# Patient Record
Sex: Female | Born: 1980
Health system: Southern US, Community
[De-identification: ages and names within clinical notes are randomized; demographics above are authoritative.]

## PROBLEM LIST (undated history)

## (undated) DIAGNOSIS — D649 Anemia, unspecified: Secondary | ICD-10-CM

## (undated) DIAGNOSIS — K219 Gastro-esophageal reflux disease without esophagitis: Secondary | ICD-10-CM

## (undated) DIAGNOSIS — I639 Cerebral infarction, unspecified: Secondary | ICD-10-CM

## (undated) DIAGNOSIS — I517 Cardiomegaly: Secondary | ICD-10-CM

## (undated) DIAGNOSIS — M1990 Unspecified osteoarthritis, unspecified site: Secondary | ICD-10-CM

## (undated) DIAGNOSIS — J45909 Unspecified asthma, uncomplicated: Secondary | ICD-10-CM

## (undated) DIAGNOSIS — M7070 Other bursitis of hip, unspecified hip: Secondary | ICD-10-CM

## (undated) DIAGNOSIS — M25569 Pain in unspecified knee: Secondary | ICD-10-CM

## (undated) DIAGNOSIS — M94261 Chondromalacia, right knee: Secondary | ICD-10-CM

## (undated) DIAGNOSIS — J449 Chronic obstructive pulmonary disease, unspecified: Secondary | ICD-10-CM

## (undated) DIAGNOSIS — M94259 Chondromalacia, unspecified hip: Secondary | ICD-10-CM

## (undated) DIAGNOSIS — H9319 Tinnitus, unspecified ear: Secondary | ICD-10-CM

## (undated) DIAGNOSIS — Z9289 Personal history of other medical treatment: Secondary | ICD-10-CM

## (undated) HISTORY — DX: Unspecified osteoarthritis, unspecified site: M19.90

## (undated) HISTORY — PX: NO PAST SURGERIES: SHX2092

## (undated) HISTORY — DX: Cerebral infarction, unspecified: I63.9

---

## 2002-06-03 ENCOUNTER — Ambulatory Visit (HOSPITAL_COMMUNITY): Admission: RE | Admit: 2002-06-03 | Discharge: 2002-06-03 | Payer: Self-pay | Admitting: *Deleted

## 2002-06-03 ENCOUNTER — Encounter: Payer: Self-pay | Admitting: *Deleted

## 2006-03-31 ENCOUNTER — Emergency Department (HOSPITAL_COMMUNITY): Admission: EM | Admit: 2006-03-31 | Discharge: 2006-03-31 | Payer: Self-pay | Admitting: Emergency Medicine

## 2006-11-16 ENCOUNTER — Emergency Department (HOSPITAL_COMMUNITY): Admission: EM | Admit: 2006-11-16 | Discharge: 2006-11-16 | Payer: Self-pay | Admitting: Emergency Medicine

## 2007-03-07 ENCOUNTER — Emergency Department (HOSPITAL_COMMUNITY): Admission: EM | Admit: 2007-03-07 | Discharge: 2007-03-07 | Payer: Self-pay | Admitting: Emergency Medicine

## 2007-07-30 ENCOUNTER — Emergency Department (HOSPITAL_COMMUNITY): Admission: EM | Admit: 2007-07-30 | Discharge: 2007-07-30 | Payer: Self-pay | Admitting: Emergency Medicine

## 2008-08-03 ENCOUNTER — Other Ambulatory Visit: Admission: RE | Admit: 2008-08-03 | Discharge: 2008-08-03 | Payer: Self-pay | Admitting: Obstetrics and Gynecology

## 2008-12-15 ENCOUNTER — Encounter (HOSPITAL_COMMUNITY): Admission: RE | Admit: 2008-12-15 | Discharge: 2009-01-14 | Payer: Self-pay | Admitting: Obstetrics & Gynecology

## 2008-12-15 ENCOUNTER — Ambulatory Visit (HOSPITAL_COMMUNITY): Payer: Self-pay | Admitting: Obstetrics & Gynecology

## 2008-12-22 ENCOUNTER — Ambulatory Visit (HOSPITAL_COMMUNITY): Payer: Self-pay | Admitting: Obstetrics and Gynecology

## 2008-12-25 ENCOUNTER — Emergency Department (HOSPITAL_COMMUNITY): Admission: EM | Admit: 2008-12-25 | Discharge: 2008-12-25 | Payer: Self-pay | Admitting: Emergency Medicine

## 2009-07-23 ENCOUNTER — Emergency Department (HOSPITAL_COMMUNITY): Admission: EM | Admit: 2009-07-23 | Discharge: 2009-07-23 | Payer: Self-pay | Admitting: Emergency Medicine

## 2009-09-03 ENCOUNTER — Emergency Department (HOSPITAL_COMMUNITY): Admission: EM | Admit: 2009-09-03 | Discharge: 2009-09-03 | Payer: Self-pay | Admitting: Emergency Medicine

## 2011-01-11 LAB — CBC
HCT: 25.7 % — ABNORMAL LOW (ref 36.0–46.0)
Hemoglobin: 8 g/dL — ABNORMAL LOW (ref 12.0–15.0)
MCHC: 31.2 g/dL (ref 30.0–36.0)
MCV: 71.9 fL — ABNORMAL LOW (ref 78.0–100.0)
Platelets: 352 10*3/uL (ref 150–400)
RBC: 3.58 MIL/uL — ABNORMAL LOW (ref 3.87–5.11)
RDW: 17.7 % — ABNORMAL HIGH (ref 11.5–15.5)
WBC: 4.9 10*3/uL (ref 4.0–10.5)

## 2011-01-11 LAB — COMPREHENSIVE METABOLIC PANEL
ALT: 16 U/L (ref 0–35)
AST: 18 U/L (ref 0–37)
Albumin: 3.7 g/dL (ref 3.5–5.2)
Alkaline Phosphatase: 42 U/L (ref 39–117)
BUN: 7 mg/dL (ref 6–23)
CO2: 27 mEq/L (ref 19–32)
Calcium: 8.8 mg/dL (ref 8.4–10.5)
Chloride: 105 mEq/L (ref 96–112)
Creatinine, Ser: 0.66 mg/dL (ref 0.4–1.2)
GFR calc Af Amer: >60 mL/min (ref >60)
GFR calc non Af Amer: >60 mL/min (ref >60)
Glucose, Bld: 95 mg/dL (ref 70–99)
Potassium: 3.3 mEq/L — ABNORMAL LOW (ref 3.5–5.1)
Sodium: 137 mEq/L (ref 135–145)
Total Bilirubin: 0.6 mg/dL (ref 0.3–1.2)
Total Protein: 7 g/dL (ref 6.0–8.3)

## 2011-01-11 LAB — DIFFERENTIAL
Basophils Absolute: 0 10*3/uL (ref 0.0–0.1)
Basophils Relative: 0 % (ref 0–1)
Eosinophils Absolute: 0.1 10*3/uL (ref 0.0–0.7)
Eosinophils Relative: 2 % (ref 0–5)
Lymphocytes Relative: 40 % (ref 12–46)
Lymphs Abs: 2 10*3/uL (ref 0.7–4.0)
Monocytes Absolute: 0.4 10*3/uL (ref 0.1–1.0)
Monocytes Relative: 9 % (ref 3–12)
Neutro Abs: 2.4 10*3/uL (ref 1.7–7.7)
Neutrophils Relative %: 49 % (ref 43–77)

## 2011-01-11 LAB — HCG, SERUM, QUALITATIVE: Preg, Serum: NEGATIVE

## 2011-01-11 LAB — WET PREP, GENITAL
Trich, Wet Prep: NONE SEEN
WBC, Wet Prep HPF POC: NONE SEEN
Yeast Wet Prep HPF POC: NONE SEEN

## 2011-01-11 LAB — GC/CHLAMYDIA PROBE AMP, GENITAL
Chlamydia, DNA Probe: NEGATIVE
GC Probe Amp, Genital: NEGATIVE

## 2011-01-11 LAB — RPR: RPR Ser Ql: NONREACTIVE

## 2011-01-12 LAB — CBC
HCT: 25.7 % — ABNORMAL LOW (ref 36.0–46.0)
Hemoglobin: 8.1 g/dL — ABNORMAL LOW (ref 12.0–15.0)
MCHC: 31.5 g/dL (ref 30.0–36.0)
MCV: 75.8 fL — ABNORMAL LOW (ref 78.0–100.0)
Platelets: 322 10*3/uL (ref 150–400)
RBC: 3.39 MIL/uL — ABNORMAL LOW (ref 3.87–5.11)
RDW: 16.5 % — ABNORMAL HIGH (ref 11.5–15.5)
WBC: 5.2 10*3/uL (ref 4.0–10.5)

## 2011-01-12 LAB — DIFFERENTIAL
Basophils Absolute: 0 10*3/uL (ref 0.0–0.1)
Basophils Relative: 1 % (ref 0–1)
Eosinophils Absolute: 0.1 10*3/uL (ref 0.0–0.7)
Eosinophils Relative: 3 % (ref 0–5)
Lymphocytes Relative: 35 % (ref 12–46)
Lymphs Abs: 1.8 10*3/uL (ref 0.7–4.0)
Monocytes Absolute: 0.4 10*3/uL (ref 0.1–1.0)
Monocytes Relative: 9 % (ref 3–12)
Neutro Abs: 2.8 10*3/uL (ref 1.7–7.7)
Neutrophils Relative %: 54 % (ref 43–77)

## 2011-01-12 LAB — BASIC METABOLIC PANEL
BUN: 6 mg/dL (ref 6–23)
CO2: 26 mEq/L (ref 19–32)
Calcium: 8.6 mg/dL (ref 8.4–10.5)
Chloride: 106 mEq/L (ref 96–112)
Creatinine, Ser: 0.68 mg/dL (ref 0.4–1.2)
GFR calc Af Amer: >60 mL/min (ref >60)
GFR calc non Af Amer: >60 mL/min (ref >60)
Glucose, Bld: 90 mg/dL (ref 70–99)
Potassium: 2.8 mEq/L — ABNORMAL LOW (ref 3.5–5.1)
Sodium: 138 mEq/L (ref 135–145)

## 2011-01-19 LAB — URINALYSIS, ROUTINE W REFLEX MICROSCOPIC
Bilirubin Urine: NEGATIVE
Glucose, UA: NEGATIVE mg/dL
Hgb urine dipstick: NEGATIVE
Ketones, ur: NEGATIVE mg/dL
Nitrite: NEGATIVE
Protein, ur: NEGATIVE mg/dL
Specific Gravity, Urine: 1.015 (ref 1.005–1.030)
Urobilinogen, UA: 0.2 mg/dL (ref 0.0–1.0)
pH: 7.5 (ref 5.0–8.0)

## 2011-01-19 LAB — URINE MICROSCOPIC-ADD ON

## 2011-01-19 LAB — URINE CULTURE: Colony Count: 60000

## 2011-07-19 LAB — DIFFERENTIAL
Basophils Absolute: 0
Basophils Relative: 0
Eosinophils Absolute: 0.2
Eosinophils Relative: 3
Lymphocytes Relative: 24
Lymphs Abs: 1.6
Monocytes Absolute: 0.5
Monocytes Relative: 8
Neutro Abs: 4.3
Neutrophils Relative %: 65

## 2011-07-19 LAB — CBC
HCT: 30.4 — ABNORMAL LOW
Hemoglobin: 9.9 — ABNORMAL LOW
MCHC: 32.8
MCV: 89.2
Platelets: 318
RBC: 3.4 — ABNORMAL LOW
RDW: 16.5 — ABNORMAL HIGH
WBC: 6.6

## 2011-07-19 LAB — BASIC METABOLIC PANEL
BUN: 6
CO2: 30
Calcium: 9.8
Chloride: 102
Creatinine, Ser: 0.61
GFR calc Af Amer: >60
GFR calc non Af Amer: >60
Glucose, Bld: 77
Potassium: 4
Sodium: 141

## 2012-12-30 ENCOUNTER — Emergency Department: Payer: Self-pay | Admitting: Emergency Medicine

## 2013-01-29 ENCOUNTER — Emergency Department: Payer: Self-pay | Admitting: Emergency Medicine

## 2013-01-29 LAB — URINALYSIS, COMPLETE
Bacteria: NONE SEEN
Bilirubin,UR: NEGATIVE
Glucose,UR: NEGATIVE mg/dL (ref 0–75)
Ketone: NEGATIVE
Nitrite: NEGATIVE
Ph: 5 (ref 4.5–8.0)
Protein: 30
RBC,UR: 875 /HPF (ref 0–5)
Specific Gravity: 1.031 (ref 1.003–1.030)
Squamous Epithelial: 6
WBC UR: 19 /HPF (ref 0–5)

## 2013-01-30 ENCOUNTER — Emergency Department: Payer: Self-pay | Admitting: Unknown Physician Specialty

## 2013-01-30 LAB — COMPREHENSIVE METABOLIC PANEL
Albumin: 3.6 g/dL (ref 3.4–5.0)
Alkaline Phosphatase: 49 U/L — ABNORMAL LOW (ref 50–136)
Anion Gap: 4 — ABNORMAL LOW (ref 7–16)
BUN: 8 mg/dL (ref 7–18)
Bilirubin,Total: 0.4 mg/dL (ref 0.2–1.0)
Calcium, Total: 8.2 mg/dL — ABNORMAL LOW (ref 8.5–10.1)
Chloride: 107 mmol/L (ref 98–107)
Co2: 25 mmol/L (ref 21–32)
Creatinine: 0.66 mg/dL (ref 0.60–1.30)
EGFR (African American): >60
EGFR (Non-African Amer.): >60
Glucose: 99 mg/dL (ref 65–99)
Osmolality: 270 (ref 275–301)
Potassium: 3.6 mmol/L (ref 3.5–5.1)
SGOT(AST): 22 U/L (ref 15–37)
SGPT (ALT): 18 U/L (ref 12–78)
Sodium: 136 mmol/L (ref 136–145)
Total Protein: 7.7 g/dL (ref 6.4–8.2)

## 2013-01-30 LAB — URINALYSIS, COMPLETE
Bilirubin,UR: NEGATIVE
Glucose,UR: NEGATIVE mg/dL (ref 0–75)
Ketone: NEGATIVE
Nitrite: NEGATIVE
Ph: 5 (ref 4.5–8.0)
Protein: 30
RBC,UR: 949 /HPF (ref 0–5)
Specific Gravity: 1.025 (ref 1.003–1.030)
Squamous Epithelial: 3
WBC UR: 6 /HPF (ref 0–5)

## 2013-01-30 LAB — CBC
HCT: 31.5 % — ABNORMAL LOW (ref 35.0–47.0)
HGB: 9.8 g/dL — ABNORMAL LOW (ref 12.0–16.0)
MCH: 24.5 pg — ABNORMAL LOW (ref 26.0–34.0)
MCHC: 31 g/dL — ABNORMAL LOW (ref 32.0–36.0)
MCV: 79 fL — ABNORMAL LOW (ref 80–100)
Platelet: 278 10*3/uL (ref 150–440)
RBC: 3.98 10*6/uL (ref 3.80–5.20)
RDW: 19.2 % — ABNORMAL HIGH (ref 11.5–14.5)
WBC: 4.3 10*3/uL (ref 3.6–11.0)

## 2013-01-30 LAB — WET PREP, GENITAL

## 2013-05-30 ENCOUNTER — Emergency Department: Payer: Self-pay | Admitting: Emergency Medicine

## 2013-07-22 ENCOUNTER — Emergency Department: Payer: Self-pay | Admitting: Emergency Medicine

## 2013-07-22 LAB — URINALYSIS, COMPLETE
Bilirubin,UR: NEGATIVE
Glucose,UR: NEGATIVE mg/dL (ref 0–75)
Ketone: NEGATIVE
Nitrite: NEGATIVE
Ph: 6 (ref 4.5–8.0)
Protein: 100
RBC,UR: 113 /HPF (ref 0–5)
Specific Gravity: 1.023 (ref 1.003–1.030)
Squamous Epithelial: 19
WBC UR: 518 /HPF (ref 0–5)

## 2013-08-26 ENCOUNTER — Emergency Department: Payer: Self-pay | Admitting: Emergency Medicine

## 2013-08-26 LAB — HCG, QUANTITATIVE, PREGNANCY: Beta Hcg, Quant.: <1 m[IU]/mL — ABNORMAL LOW

## 2013-08-26 LAB — URINALYSIS, COMPLETE
Bilirubin,UR: NEGATIVE
Blood: NEGATIVE
Glucose,UR: NEGATIVE mg/dL (ref 0–75)
Ketone: NEGATIVE
Leukocyte Esterase: NEGATIVE
Nitrite: NEGATIVE
Ph: 6 (ref 4.5–8.0)
Protein: NEGATIVE
RBC,UR: 2 /HPF (ref 0–5)
Specific Gravity: 1.03 (ref 1.003–1.030)
Squamous Epithelial: 13
WBC UR: 3 /HPF (ref 0–5)

## 2013-08-26 LAB — COMPREHENSIVE METABOLIC PANEL
Albumin: 3.6 g/dL (ref 3.4–5.0)
Alkaline Phosphatase: 59 U/L (ref 50–136)
Anion Gap: 7 (ref 7–16)
BUN: 9 mg/dL (ref 7–18)
Bilirubin,Total: 0.3 mg/dL (ref 0.2–1.0)
Calcium, Total: 8.8 mg/dL (ref 8.5–10.1)
Chloride: 105 mmol/L (ref 98–107)
Co2: 27 mmol/L (ref 21–32)
Creatinine: 0.77 mg/dL (ref 0.60–1.30)
EGFR (African American): >60
EGFR (Non-African Amer.): >60
Glucose: 123 mg/dL — ABNORMAL HIGH (ref 65–99)
Osmolality: 278 (ref 275–301)
Potassium: 3.3 mmol/L — ABNORMAL LOW (ref 3.5–5.1)
SGOT(AST): 20 U/L (ref 15–37)
SGPT (ALT): 18 U/L (ref 12–78)
Sodium: 139 mmol/L (ref 136–145)
Total Protein: 7.4 g/dL (ref 6.4–8.2)

## 2013-08-26 LAB — CBC WITH DIFFERENTIAL/PLATELET
Basophil #: 0 10*3/uL (ref 0.0–0.1)
Basophil %: 0.5 %
Eosinophil #: 0.1 10*3/uL (ref 0.0–0.7)
Eosinophil %: 1.8 %
HCT: 28.5 % — ABNORMAL LOW (ref 35.0–47.0)
HGB: 9 g/dL — ABNORMAL LOW (ref 12.0–16.0)
Lymphocyte #: 1.9 10*3/uL (ref 1.0–3.6)
Lymphocyte %: 30.4 %
MCH: 24.1 pg — ABNORMAL LOW (ref 26.0–34.0)
MCHC: 31.5 g/dL — ABNORMAL LOW (ref 32.0–36.0)
MCV: 76 fL — ABNORMAL LOW (ref 80–100)
Monocyte #: 0.5 x10 3/mm (ref 0.2–0.9)
Monocyte %: 7.6 %
Neutrophil #: 3.8 10*3/uL (ref 1.4–6.5)
Neutrophil %: 59.7 %
Platelet: 324 10*3/uL (ref 150–440)
RBC: 3.74 10*6/uL — ABNORMAL LOW (ref 3.80–5.20)
RDW: 17.7 % — ABNORMAL HIGH (ref 11.5–14.5)
WBC: 6.4 10*3/uL (ref 3.6–11.0)

## 2013-10-20 ENCOUNTER — Emergency Department (HOSPITAL_COMMUNITY)
Admission: EM | Admit: 2013-10-20 | Discharge: 2013-10-20 | Disposition: A | Discharge status: Home / Self Care | Payer: Medicaid Other | Attending: Emergency Medicine | Admitting: Emergency Medicine

## 2013-10-20 ENCOUNTER — Encounter (HOSPITAL_COMMUNITY): Payer: Self-pay | Admitting: Emergency Medicine

## 2013-10-20 DIAGNOSIS — G8929 Other chronic pain: Secondary | ICD-10-CM | POA: Insufficient documentation

## 2013-10-20 DIAGNOSIS — M25561 Pain in right knee: Secondary | ICD-10-CM

## 2013-10-20 DIAGNOSIS — M25569 Pain in unspecified knee: Secondary | ICD-10-CM | POA: Insufficient documentation

## 2013-10-20 HISTORY — DX: Personal history of other medical treatment: Z92.89

## 2013-10-20 HISTORY — DX: Gastro-esophageal reflux disease without esophagitis: K21.9

## 2013-10-20 HISTORY — DX: Unspecified asthma, uncomplicated: J45.909

## 2013-10-20 HISTORY — DX: Pain in unspecified knee: M25.569
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
## 2013-10-20 NOTE — Discharge Instructions (Signed)
Take ibuprofen, tylenol or aleve every 6 hours as needed for pain.  Knee Bracing Knee braces are supports to help stabilize and protect an injured or painful knee. They come in many different styles. They should support and protect the knee without increasing the chance of other injuries to yourself or others. It is important not to have a false sense of security when using a brace. Knee braces that help you to keep using your knee:  Do not restore normal knee stability under high stress forces.  May decrease some aspects of athletic performance. Some of the different types of knee braces are:  Prophylactic knee braces are designed to prevent or reduce the severity of knee injuries during sports that make injury to the knee more likely.  Rehabilitative knee braces are designed to allow protected motion of:  Injured knees.  Knees that have been treated with or without surgery. There is no evidence that the use of a supportive knee brace protects the graft following a successful anterior cruciate ligament (ACL) reconstruction. However, braces are sometimes used to:   Protect injured ligaments.  Control knee movement during the initial healing period. They may be used as part of the treatment program for the various injured ligaments or cartilage of the knee including the:  Anterior cruciate ligament.  Medial collateral ligament.  Medial or lateral cartilage (meniscus).  Posterior cruciate ligament.  Lateral collateral ligament. Rehabilitative knee braces are most commonly used:  During crutch-assisted walking right after injury.  During crutch-assisted walking right after surgery to repair the cartilage and/or cruciate ligament injury.  For a short period of time, 2-8 weeks, after the injury or surgery. The value of a rehabilitative brace as opposed to a cast or splint includes the:  Ability to adjust the brace for swelling.  Ability to remove the brace for examinations,  icing or showering.  Ability to allow for movement in a controlled range of motion. Functional knee braces give support to knees that have already been injured. They are designed to provide stability for the injured knee and provide protection after repair. Functional knee braces may not affect performance much. Lower extremity muscle strengthening, flexibility, and improvement in technique are more important than bracing in treating ligamentous knee injuries. Functional braces are not a substitute for rehabilitation or surgical procedures. Unloader/offloader braces are designed to provide pain relief in arthritic knees. Patients with wear and tear arthritis from growing old or from an old cartilage injury (osteoarthritis) of the knee, and bow legged (varus) or knock knee (valgus) deformities, often develop increased pain in the arthritic side due to increased loading. Unloader/offloader braces are made to reduce uneven loading in such knees. There is reduction in bowing out movement in bow legged knees when the correct unloader brace is used. Patients with advanced osteoarthritis or severe varus or valgus alignment problems would not likely benefit from bracing. Patellofemoral braces help the kneecap to move smoothly and well centered over the end of the femur in the knee.  Most people who wear knee braces feel that they help. However, there is a lack of scientific evidence that knee braces are helpful at the level needed for athletic participation to prevent injury. In spite of this, athletes report an increase in knee stability, pain relief, performance improvement, and confidence during athletics when using a brace.  Different knee problems require different knee braces:  Your caregiver may suggest one kind of knee brace after knee surgery.  A caregiver may choose another kind of  knee brace for support instead of surgery for some types of torn ligaments.  You may also need one for pain in the front  of your knee that is not getting better with strengthening and flexibility exercises. Get your caregiver's advice if you want to try a knee brace. The caregiver will advise you on where to get them and provide a prescription when it is needed to fashion and/or fit the brace. Knee braces are the least important part of preventing knee injuries or getting better following injury. Stretching, strengthening and technique improvement are far more important in caring for and preventing knee injuries. When strengthening your knee, increase your activities a little at a time so as not to develop injuries from over use. Work out an exercise plan with your caregiver and/or physical therapist to get the best program for you. Do not let a knee brace become a crutch. Always remember, there are no braces which support the knee as well as your original ligaments and cartilage you were born with. Conditioning, proper warm-up and stretching remain the most important parts of keeping your knees healthy. HOW TO USE A KNEE BRACE  During sports, knee braces should be used as directed by your caregiver.  Make sure that the hinges are where the knee bends.  Straps, tapes, or hook-and-loop tapes should be fastened around your leg as instructed.  You should check the placement of the brace during activities to make sure that it has not moved. Poorly positioned braces can hurt rather than help you.  To work well, a knee brace should be worn during all activities that put you at risk of knee injury.  Warm up properly before beginning athletic activities. HOME CARE INSTRUCTIONS  Knee braces often get damaged during normal use. Replace worn-out braces for maximum benefit.  Clean regularly with soap and water.  Inspect your brace often for wear and tear.  Cover exposed metal to protect others from injury.  Durable materials may cost more, but last longer. SEEK IMMEDIATE MEDICAL CARE IF:   Your knee seems to be  getting worse rather than better.  You have increasing pain or swelling in the knee.  You have problems caused by the knee brace.  You have increased swelling or inflammation (redness or soreness) in your knee.  Your knee becomes warm and more painful and you develop an unexplained temperature over 101 F (38.3 C). MAKE SURE YOU:   Understand these instructions.  Will watch your condition.  Will get help right away if you are not doing well or get worse. See your caregiver, physical therapist or orthopedic surgeon for additional information. Document Released: 12/16/2003 Document Revised: 12/18/2011 Document Reviewed: 03/24/2009 [facility] Patient Information 2014 [place], [place].  Knee Pain Knee pain can be a result of an injury or other medical conditions. Treatment will depend on the cause of your pain. HOME CARE  Only take medicine as told by your doctor.  Keep a healthy weight. Being overweight can make the knee hurt more.  Stretch before exercising or playing sports.  If there is constant knee pain, change the way you exercise. Ask your doctor for advice.  Make sure shoes fit well. Choose the right shoe for the sport or activity.  Protect your knees. Wear kneepads if needed.  Rest when you are tired. GET HELP RIGHT AWAY IF:   Your knee pain does not stop.  Your knee pain does not get better.  Your knee joint feels hot to the touch.  You have a fever. MAKE SURE YOU:   Understand these instructions.  Will watch this condition.  Will get help right away if you are not doing well or get worse. Document Released: 12/22/2008 Document Revised: 12/18/2011 Document Reviewed: 12/22/2008 [facility] Patient Information 2014 [place], [place].

## 2013-10-20 NOTE — ED Provider Notes (Signed)
Medical screening examination/treatment/procedure(s) were performed by non-physician practitioner and as supervising physician I was immediately available for consultation/collaboration.  EKG Interpretation   None        Talasia Saulter R. Jaylee Lantry, MD 10/20/13 1608 

## 2013-10-20 NOTE — ED Notes (Signed)
Crutches and ace wrap given/applied to patient with instruction and pt demonstration. Pt comfortable with d/c and f/u instructions. No prescriptions.

## 2013-10-20 NOTE — ED Provider Notes (Signed)
CSN: [id number]     Arrival date & time 10/20/13  1009 History   First MD Initiated Contact with Patient 10/20/13 1016    This chart was scribed for Lorenda Peck, a non-physician practitioner working with Jasper Riling. Alvino Chapel, MD by Denice Bors, ED Scribe. This patient was seen in room TR06C/TR06C and the patient's care was started at 10:59 AM     No chief complaint on file.  (Consider location/radiation/quality/duration/timing/severity/associated sxs/prior Treatment) The history is provided by the patient. No language interpreter was used.   HPI Comments: Nancy Dickerson is a 33 y.o. female who presents to the Emergency Department complaining of constant worsening right knee pain onset chronic since she was a child. Reports she was previously evaluated by [facility] and is requesting an MRI. Reports she was previously evaluated by an orthopedist who recommends physical therapy per pt. Reports knee pain is exacerbated with walking up and down stairs. Denies trying any alleviating factors. Denies associated recent trauma, fall, weakness, fever, and numbness. Denies taking any prescribed medications.     No past medical history on file. No past surgical history on file. No family history on file. History  Substance Use Topics  . Smoking status: Not on file  . Smokeless tobacco: Not on file  . Alcohol Use: Not on file   OB History   No data available     Review of Systems  Constitutional: Negative for fever.  Musculoskeletal: Positive for arthralgias.  Neurological: Negative for numbness.  Psychiatric/Behavioral: Negative for confusion.    Allergies  Review of patient's allergies indicates not on file.  Home Medications  No current outpatient prescriptions on file. There were no vitals taken for this visit. Physical Exam  Nursing note and vitals reviewed. Constitutional: She is oriented to person, place, and time. She appears well-developed and well-nourished. No  distress.  HENT:  Head: Normocephalic and atraumatic.  Eyes: EOM are normal.  Neck: Neck supple. No tracheal deviation present.  Cardiovascular: Normal rate.   Pulmonary/Chest: Effort normal. No respiratory distress.  Musculoskeletal: Normal range of motion. She exhibits tenderness.  TTP to medial joint line of right knee. Positive McMurray's test. No swelling.  Neurological: She is alert and oriented to person, place, and time.  Skin: Skin is warm and dry.  Psychiatric: She has a normal mood and affect. Her behavior is normal.    ED Course  Procedures  COORDINATION OF CARE:  Nursing notes reviewed. Vital signs reviewed. Initial pt interview and examination performed.   10:23 AM-Discussed treatment plan with pt at bedside. Pt agrees with plan.   Treatment plan initiated:Medications - No data to display   Initial diagnostic testing ordered.    Labs Review Labs Reviewed - No data to display Imaging Review No results found.  EKG Interpretation   None       MDM   1. Knee pain, right    Pt presenting with right knee pain since she was a child, worsening. She is well appearing, NAD, normal VS. No swelling. Tender at medial joint line, positive McMurray's test. Possible meniscal injury. Advised f/u with ortho. ACE wrap and crutches given. NSAIDs. Return precautions given. Patient states understanding of treatment care plan and is agreeable.   I personally performed the services described in this documentation, which was scribed in my presence. The recorded information has been reviewed and is accurate.   Illene Labrador, PA-C 10/20/13 1107

## 2013-11-03 ENCOUNTER — Ambulatory Visit: Payer: Self-pay | Admitting: Family Medicine

## 2013-12-04 ENCOUNTER — Emergency Department: Payer: Self-pay | Admitting: Internal Medicine

## 2013-12-26 ENCOUNTER — Emergency Department: Payer: Self-pay | Admitting: Emergency Medicine

## 2013-12-28 ENCOUNTER — Encounter (HOSPITAL_COMMUNITY): Payer: Self-pay | Admitting: Emergency Medicine

## 2013-12-28 ENCOUNTER — Emergency Department (HOSPITAL_COMMUNITY)
Admission: EM | Admit: 2013-12-28 | Discharge: 2013-12-28 | Disposition: A | Discharge status: Home / Self Care | Payer: Medicaid Other | Attending: Emergency Medicine | Admitting: Emergency Medicine

## 2013-12-28 DIAGNOSIS — Z8719 Personal history of other diseases of the digestive system: Secondary | ICD-10-CM | POA: Insufficient documentation

## 2013-12-28 DIAGNOSIS — L02211 Cutaneous abscess of abdominal wall: Secondary | ICD-10-CM

## 2013-12-28 DIAGNOSIS — J45909 Unspecified asthma, uncomplicated: Secondary | ICD-10-CM | POA: Insufficient documentation

## 2013-12-28 DIAGNOSIS — L02219 Cutaneous abscess of trunk, unspecified: Secondary | ICD-10-CM | POA: Insufficient documentation

## 2013-12-28 DIAGNOSIS — L03319 Cellulitis of trunk, unspecified: Principal | ICD-10-CM

## 2013-12-28 DIAGNOSIS — N390 Urinary tract infection, site not specified: Secondary | ICD-10-CM | POA: Insufficient documentation

## 2013-12-28 MED ORDER — OXYCODONE-ACETAMINOPHEN 5-325 MG PO TABS
1.0000 | ORAL_TABLET | Freq: Once | ORAL | Status: AC
  Administered 2013-12-28: 1 via ORAL
  Filled 2013-12-28: qty 1

## 2013-12-28 MED ORDER — CEPHALEXIN 250 MG/5ML PO SUSR: 500.0000 mg | Freq: Four times a day (QID) | ORAL | Status: AC

## 2013-12-28 MED ORDER — OXYCODONE-ACETAMINOPHEN 5-325 MG PO TABS: 1.0000 | ORAL_TABLET | ORAL | Status: DC | PRN

## 2013-12-28 NOTE — ED Notes (Signed)
Pt presents to department for evaluation of possible abscess to abdomen. Ongoing x1 week. States area is red and painful. Also states she was recently diagnosed with UTI, can't take bactrim that she was prescribed, states she get nauseated. Pt is alert and oriented x4. NAD.

## 2013-12-28 NOTE — ED Provider Notes (Signed)
CSN: [id number]     Arrival date & time 12/28/13  1248 History  This chart was scribed for non-physician practitioner Jeannett Senior working with Babette Relic, MD by Donato Schultz, ED Scribe. This patient was seen in room TR10C/TR10C and the patient's care was started at 1:18 PM.     Chief Complaint  Patient presents with  . Abscess  . Urinary Tract Infection    The history is provided by the patient. No language interpreter was used.   HPI Comments: Nancy Dickerson is a 33 y.o. female who presents to the Emergency Department complaining of a worsening abdominal abscess that started a week ago.  The patient states that she has seen doctors in the ED and her PCP for her abscess and was prescribed antibiotics.  She states that she tried to take the antibiotics but she threw it up.  She states that she was diagnosed with a severe UTI two days ago but she is unable to take the antibiotics.     Past Medical History  Diagnosis Date  . Knee pain   . Asthma   . H/O transfusion of packed red blood cells   . GERD (gastroesophageal reflux disease)    History reviewed. No pertinent past surgical history. No family history on file. History  Substance Use Topics  . Smoking status: Never Smoker   . Smokeless tobacco: Not on file  . Alcohol Use: No   OB History   Grav Para Term Preterm Abortions TAB SAB Ect Mult Living                 Review of Systems  All other systems reviewed and are negative.      Allergies  Review of patient's allergies indicates no known allergies.  Home Medications  No current outpatient prescriptions on file.  Triage Vitals: BP 128/86  Pulse 91  Temp(Src) 98.8 F (37.1 C) (Oral)  Resp 16  Ht 5\' 3"  (1.6 m)  Wt 195 lb (88.451 kg)  BMI 34.55 kg/m2  SpO2 100%  Physical Exam  Nursing note and vitals reviewed. Constitutional: She is oriented to person, place, and time. She appears well-developed and well-nourished.  HENT:  Head: Normocephalic and  atraumatic.  Eyes: EOM are normal.  Neck: Normal range of motion.  Cardiovascular: Normal rate.   Pulmonary/Chest: Effort normal.  Musculoskeletal: Normal range of motion.  Neurological: She is alert and oriented to person, place, and time.  Skin: Skin is warm and dry.  Psychiatric: She has a normal mood and affect. Her behavior is normal.    ED Course  Procedures (including critical care time)  DIAGNOSTIC STUDIES: Oxygen Saturation is 100% on room air, normal by my interpretation.    COORDINATION OF CARE: 1:20 PM- Discussed performing an I&D on the patient's abdominal abscess and prescribing the patient with a liquid antibiotic.  The patient agreed to the treatment plan.    Labs Review Labs Reviewed - No data to display Imaging Review No results found.   EKG Interpretation None      INCISION AND DRAINAGE Performed by: Jeannett Senior A Consent: Verbal consent obtained. Risks and benefits: risks, benefits and alternatives were discussed Type: abscess  Body area: abdominal wall  Anesthesia: local infiltration  Incision was made with a scalpel.  Local anesthetic: lidocaine 2% w epinephrine  Anesthetic total: 3 ml  Complexity: complex Blunt dissection to break up loculations  Drainage: purulent  Drainage amount: large  Packing material: 1/4 in iodoform gauze  Patient tolerance: Patient tolerated the procedure well with no immediate complications.    MDM   Final diagnoses:  Abscess of abdominal wall    Patient with abdominal wall abscess, worsening, states already been seen by urgent care primary care Dr. She was prescribed Bactrim pills which she states she cannot take, because unable to swallow pills. She also states she was diagnosed with a UTI, and states she was given Bactrim for that tube which she cannot take. Continues to have urinary symptoms. Denies any fever, flank pain, nausea vomiting. I incised and drained the abscess on her abdomen,  with large purulent drainage. Packing was placed. I will start her on Keflex, suspension, and close followup. Norco as prescribed for her pain. The patient started to followup in 2 days with her Dr. or return here if her symptoms are worsening.  Filed Vitals:   12/28/13 1251 12/28/13 1346  BP: 128/86 121/74  Pulse: 91 70  Temp: 98.8 F (37.1 C) 98.8 F (37.1 C)  TempSrc: Oral Oral  Resp: 16 20  Height: 5\' 3"  (1.6 m)   Weight: 195 lb (88.451 kg)   SpO2: 100% 98%    I personally performed the services described in this documentation, which was scribed in my presence. The recorded information has been reviewed and is accurate.   Renold Genta, PA-C 12/28/13 1553

## 2013-12-28 NOTE — Discharge Instructions (Signed)
Take keflex as prescribed until all gone. This will help you with your UTI and an abscess. In two days, if abscess is looking better, you can remove the packing. Keep it covered. After packing is removed, you can wash it with soap and water. It will take up to a week to completely heal. Take pain medications as prescribed. Follow up with your doctor.    Abscess Care After An abscess (also called a boil or furuncle) is an infected area that contains a collection of pus. Signs and symptoms of an abscess include pain, tenderness, redness, or hardness, or you may feel a moveable soft area under your skin. An abscess can occur anywhere in the body. The infection may spread to surrounding tissues causing cellulitis. A cut (incision) by the surgeon was made over your abscess and the pus was drained out. Gauze may have been packed into the space to provide a drain that will allow the cavity to heal from the inside outwards. The boil may be painful for 5 to 7 days. Most people with a boil do not have high fevers. Your abscess, if seen early, may not have localized, and may not have been lanced. If not, another appointment may be required for this if it does not get better on its own or with medications. HOME CARE INSTRUCTIONS   Only take over-the-counter or prescription medicines for pain, discomfort, or fever as directed by your caregiver.  When you bathe, soak and then remove gauze or iodoform packs at least daily or as directed by your caregiver. You may then wash the wound gently with mild soapy water. Repack with gauze or do as your caregiver directs. SEEK IMMEDIATE MEDICAL CARE IF:   You develop increased pain, swelling, redness, drainage, or bleeding in the wound site.  You develop signs of generalized infection including muscle aches, chills, fever, or a general ill feeling.  An oral temperature above 102 F (38.9 C) develops, not controlled by medication. See your caregiver for a recheck if you  develop any of the symptoms described above. If medications (antibiotics) were prescribed, take them as directed. Document Released: 04/13/2005 Document Revised: 12/18/2011 Document Reviewed: 12/09/2007 [facility] Patient Information 2014 [place].

## 2013-12-29 NOTE — ED Provider Notes (Signed)
Medical screening examination/treatment/procedure(s) were performed by non-physician practitioner and as supervising physician I was immediately available for consultation/collaboration.     Babette Relic, MD 12/29/13 [id number]

## 2014-02-28 ENCOUNTER — Emergency Department: Payer: Self-pay | Admitting: Emergency Medicine

## 2014-02-28 LAB — COMPREHENSIVE METABOLIC PANEL
Albumin: 3.7 g/dL (ref 3.4–5.0)
Alkaline Phosphatase: 46 U/L
Anion Gap: 3 — ABNORMAL LOW (ref 7–16)
BUN: 12 mg/dL (ref 7–18)
Bilirubin,Total: 0.3 mg/dL (ref 0.2–1.0)
Calcium, Total: 8.6 mg/dL (ref 8.5–10.1)
Chloride: 108 mmol/L — ABNORMAL HIGH (ref 98–107)
Co2: 28 mmol/L (ref 21–32)
Creatinine: 0.79 mg/dL (ref 0.60–1.30)
EGFR (African American): >60
EGFR (Non-African Amer.): >60
Glucose: 87 mg/dL (ref 65–99)
Osmolality: 277 (ref 275–301)
Potassium: 3.7 mmol/L (ref 3.5–5.1)
SGOT(AST): 23 U/L (ref 15–37)
SGPT (ALT): 18 U/L (ref 12–78)
Sodium: 139 mmol/L (ref 136–145)
Total Protein: 8.1 g/dL (ref 6.4–8.2)

## 2014-02-28 LAB — URINALYSIS, COMPLETE
Bacteria: NONE SEEN
Bilirubin,UR: NEGATIVE
Glucose,UR: NEGATIVE mg/dL (ref 0–75)
Ketone: NEGATIVE
Nitrite: NEGATIVE
Ph: 5 (ref 4.5–8.0)
Protein: NEGATIVE
RBC,UR: 8 /HPF (ref 0–5)
Specific Gravity: 1.026 (ref 1.003–1.030)
Squamous Epithelial: 31
WBC UR: 18 /HPF (ref 0–5)

## 2014-02-28 LAB — CBC
HCT: 31.5 % — ABNORMAL LOW (ref 35.0–47.0)
HGB: 9.7 g/dL — ABNORMAL LOW (ref 12.0–16.0)
MCH: 23.3 pg — ABNORMAL LOW (ref 26.0–34.0)
MCHC: 30.7 g/dL — ABNORMAL LOW (ref 32.0–36.0)
MCV: 76 fL — ABNORMAL LOW (ref 80–100)
Platelet: 292 10*3/uL (ref 150–440)
RBC: 4.15 10*6/uL (ref 3.80–5.20)
RDW: 19.3 % — ABNORMAL HIGH (ref 11.5–14.5)
WBC: 5.2 10*3/uL (ref 3.6–11.0)

## 2014-05-05 ENCOUNTER — Emergency Department: Payer: Self-pay | Admitting: Emergency Medicine

## 2014-06-30 ENCOUNTER — Emergency Department (HOSPITAL_COMMUNITY): Payer: Medicaid Other

## 2014-06-30 ENCOUNTER — Encounter (HOSPITAL_COMMUNITY): Payer: Self-pay | Admitting: Emergency Medicine

## 2014-06-30 ENCOUNTER — Emergency Department (HOSPITAL_COMMUNITY)
Admission: EM | Admit: 2014-06-30 | Discharge: 2014-06-30 | Disposition: A | Discharge status: Home / Self Care | Payer: Medicaid Other | Attending: Emergency Medicine | Admitting: Emergency Medicine

## 2014-06-30 DIAGNOSIS — J45909 Unspecified asthma, uncomplicated: Secondary | ICD-10-CM | POA: Diagnosis not present

## 2014-06-30 DIAGNOSIS — Z8719 Personal history of other diseases of the digestive system: Secondary | ICD-10-CM | POA: Insufficient documentation

## 2014-06-30 DIAGNOSIS — Z8739 Personal history of other diseases of the musculoskeletal system and connective tissue: Secondary | ICD-10-CM | POA: Insufficient documentation

## 2014-06-30 DIAGNOSIS — N72 Inflammatory disease of cervix uteri: Secondary | ICD-10-CM | POA: Diagnosis not present

## 2014-06-30 DIAGNOSIS — Z3202 Encounter for pregnancy test, result negative: Secondary | ICD-10-CM | POA: Diagnosis not present

## 2014-06-30 DIAGNOSIS — R110 Nausea: Secondary | ICD-10-CM | POA: Insufficient documentation

## 2014-06-30 DIAGNOSIS — R197 Diarrhea, unspecified: Secondary | ICD-10-CM | POA: Diagnosis not present

## 2014-06-30 DIAGNOSIS — R5381 Other malaise: Secondary | ICD-10-CM | POA: Insufficient documentation

## 2014-06-30 DIAGNOSIS — R209 Unspecified disturbances of skin sensation: Secondary | ICD-10-CM | POA: Insufficient documentation

## 2014-06-30 DIAGNOSIS — B373 Candidiasis of vulva and vagina: Secondary | ICD-10-CM

## 2014-06-30 DIAGNOSIS — B3731 Acute candidiasis of vulva and vagina: Secondary | ICD-10-CM | POA: Diagnosis not present

## 2014-06-30 DIAGNOSIS — R5383 Other fatigue: Secondary | ICD-10-CM

## 2014-06-30 DIAGNOSIS — D649 Anemia, unspecified: Secondary | ICD-10-CM | POA: Diagnosis not present

## 2014-06-30 HISTORY — DX: Other bursitis of hip, unspecified hip: M70.70

## 2014-06-30 LAB — URINALYSIS, ROUTINE W REFLEX MICROSCOPIC
Bilirubin Urine: NEGATIVE
Glucose, UA: NEGATIVE mg/dL
Hgb urine dipstick: NEGATIVE
Ketones, ur: NEGATIVE mg/dL
Nitrite: NEGATIVE
Protein, ur: NEGATIVE mg/dL
Specific Gravity, Urine: 1.021 (ref 1.005–1.030)
Urobilinogen, UA: 0.2 mg/dL (ref 0.0–1.0)
pH: 6.5 (ref 5.0–8.0)

## 2014-06-30 LAB — COMPREHENSIVE METABOLIC PANEL
ALT: 10 U/L (ref 0–35)
AST: 13 U/L (ref 0–37)
Albumin: 3.6 g/dL (ref 3.5–5.2)
Alkaline Phosphatase: 45 U/L (ref 39–117)
Anion gap: 11 (ref 5–15)
BUN: 7 mg/dL (ref 6–23)
CO2: 26 mEq/L (ref 19–32)
Calcium: 8.5 mg/dL (ref 8.4–10.5)
Chloride: 103 mEq/L (ref 96–112)
Creatinine, Ser: 0.67 mg/dL (ref 0.50–1.10)
GFR calc Af Amer: >90 mL/min (ref >90)
GFR calc non Af Amer: >90 mL/min (ref >90)
Glucose, Bld: 85 mg/dL (ref 70–99)
Potassium: 3.5 mEq/L — ABNORMAL LOW (ref 3.7–5.3)
Sodium: 140 mEq/L (ref 137–147)
Total Bilirubin: 0.3 mg/dL (ref 0.3–1.2)
Total Protein: 7.1 g/dL (ref 6.0–8.3)

## 2014-06-30 LAB — CBC WITH DIFFERENTIAL/PLATELET
Basophils Absolute: 0 10*3/uL (ref 0.0–0.1)
Basophils Relative: 0 % (ref 0–1)
Eosinophils Absolute: 0.1 10*3/uL (ref 0.0–0.7)
Eosinophils Relative: 2 % (ref 0–5)
HCT: 28.7 % — ABNORMAL LOW (ref 36.0–46.0)
Hemoglobin: 8.7 g/dL — ABNORMAL LOW (ref 12.0–15.0)
Lymphocytes Relative: 35 % (ref 12–46)
Lymphs Abs: 1.6 10*3/uL (ref 0.7–4.0)
MCH: 23.3 pg — ABNORMAL LOW (ref 26.0–34.0)
MCHC: 30.3 g/dL (ref 30.0–36.0)
MCV: 76.7 fL — ABNORMAL LOW (ref 78.0–100.0)
Monocytes Absolute: 0.5 10*3/uL (ref 0.1–1.0)
Monocytes Relative: 10 % (ref 3–12)
Neutro Abs: 2.5 10*3/uL (ref 1.7–7.7)
Neutrophils Relative %: 53 % (ref 43–77)
Platelets: 354 10*3/uL (ref 150–400)
RBC: 3.74 MIL/uL — ABNORMAL LOW (ref 3.87–5.11)
RDW: 17.1 % — ABNORMAL HIGH (ref 11.5–15.5)
WBC: 4.7 10*3/uL (ref 4.0–10.5)

## 2014-06-30 LAB — LIPASE, BLOOD: Lipase: 24 U/L (ref 11–59)

## 2014-06-30 LAB — URINE MICROSCOPIC-ADD ON

## 2014-06-30 LAB — WET PREP, GENITAL
Trich, Wet Prep: NONE SEEN
Yeast Wet Prep HPF POC: NONE SEEN

## 2014-06-30 LAB — POC URINE PREG, ED: Preg Test, Ur: NEGATIVE

## 2014-06-30 MED ORDER — CEFTRIAXONE SODIUM 250 MG IJ SOLR
250.0000 mg | INTRAMUSCULAR | Status: DC
  Administered 2014-06-30: 250 mg via INTRAMUSCULAR
  Filled 2014-06-30: qty 250

## 2014-06-30 MED ORDER — LIDOCAINE HCL (PF) 1 % IJ SOLN
INTRAMUSCULAR | Status: AC
  Administered 2014-06-30: 5 mL
  Filled 2014-06-30: qty 5

## 2014-06-30 MED ORDER — AZITHROMYCIN 250 MG PO TABS
1000.0000 mg | ORAL_TABLET | Freq: Once | ORAL | Status: AC
  Administered 2014-06-30: 1000 mg via ORAL
  Filled 2014-06-30: qty 4

## 2014-06-30 MED ORDER — DIPHENOXYLATE-ATROPINE 2.5-0.025 MG PO TABS: 1.0000 | ORAL_TABLET | Freq: Four times a day (QID) | ORAL | Status: DC | PRN

## 2014-06-30 MED ORDER — ONDANSETRON HCL 4 MG/2ML IJ SOLN
4.0000 mg | Freq: Once | INTRAMUSCULAR | Status: AC
  Administered 2014-06-30: 4 mg via INTRAVENOUS
  Filled 2014-06-30: qty 2

## 2014-06-30 MED ORDER — CEFTRIAXONE SODIUM 1 G IJ SOLR
1.0000 g | Freq: Once | INTRAMUSCULAR | Status: DC
  Filled 2014-06-30: qty 10

## 2014-06-30 MED ORDER — LIDOCAINE HCL (PF) 1 % IJ SOLN
5.0000 mL | Freq: Once | INTRAMUSCULAR | Status: AC
  Administered 2014-06-30: 5 mL

## 2014-06-30 MED ORDER — SODIUM CHLORIDE 0.9 % IV BOLUS (SEPSIS)
1000.0000 mL | Freq: Once | INTRAVENOUS | Status: AC
  Administered 2014-06-30: 1000 mL via INTRAVENOUS

## 2014-06-30 MED ORDER — CLOTRIMAZOLE 1 % EX CREA: TOPICAL_CREAM | CUTANEOUS | Status: DC

## 2014-06-30 NOTE — ED Provider Notes (Signed)
CSN: [id number]     Arrival date & time 06/30/14  0813 History   First MD Initiated Contact with Patient 06/30/14 [phone]     Chief Complaint  Patient presents with  . Diarrhea     (Consider location/radiation/quality/duration/timing/severity/associated sxs/prior Treatment) HPI Nancy Dickerson is a 33 y.o. female who presents emergency department complaining of diarrhea, abdominal pain, weakness for one month. Patient states she has symptoms almost daily of multiple episodes of watery and mucousy stools, diffuse abdominal cramping, nausea, no appetite, tingling and weakness in extremities. Patient states she feels like some days are better than others, and Sunday she is able to eat. States other days she is to nausea TE initiated as she has immediate episodes of watery diarrhea. She reports generalized weakness. She reports chills at home but did not check her temperature. She denies any urinary symptoms. No vaginal symptoms.  Past Medical History  Diagnosis Date  . Knee pain   . Asthma   . H/O transfusion of packed red blood cells   . GERD (gastroesophageal reflux disease)   . Bursitis of hip    History reviewed. No pertinent past surgical history. No family history on file. History  Substance Use Topics  . Smoking status: Never Smoker   . Smokeless tobacco: Not on file  . Alcohol Use: No   OB History   Grav Para Term Preterm Abortions TAB SAB Ect Mult Living                 Review of Systems  Constitutional: Positive for chills and fatigue. Negative for fever.  Respiratory: Negative for cough, chest tightness and shortness of breath.   Cardiovascular: Negative for chest pain, palpitations and leg swelling.  Gastrointestinal: Positive for abdominal pain and diarrhea. Negative for nausea and vomiting.  Genitourinary: Negative for dysuria, flank pain, vaginal bleeding, vaginal discharge, vaginal pain and pelvic pain.  Musculoskeletal: Negative for arthralgias, myalgias, neck pain and  neck stiffness.  Skin: Negative for rash.  Neurological: Positive for weakness. Negative for dizziness and headaches.  All other systems reviewed and are negative.     Allergies  Review of patient's allergies indicates no known allergies.  Home Medications   Prior to Admission medications   Medication Sig Start Date End Date Taking? Authorizing Provider  bismuth subsalicylate (PEPTO BISMOL) 262 MG/15ML suspension Take 30 mLs by mouth every 6 (six) hours as needed for diarrhea or loose stools.   Yes Historical Provider, MD  loperamide (IMODIUM) 2 MG capsule Take 2 mg by mouth as needed for diarrhea or loose stools.   Yes Historical Provider, MD   BP 126/82  Pulse 73  Temp(Src) 98.1 F (36.7 C) (Oral)  Resp 15  Ht 5\' 3"  (1.6 m)  Wt 192 lb (87.091 kg)  BMI 34.02 kg/m2  SpO2 100%  LMP 06/13/2014 Physical Exam  Nursing note and vitals reviewed. Constitutional: She is oriented to person, place, and time. She appears well-developed and well-nourished. No distress.  HENT:  Head: Normocephalic.  Eyes: Conjunctivae are normal.  Neck: Normal range of motion. Neck supple.  Cardiovascular: Normal rate, regular rhythm and normal heart sounds.   Pulmonary/Chest: Effort normal and breath sounds normal. No respiratory distress. She has no wheezes. She has no rales.  Abdominal: Soft. Bowel sounds are normal. She exhibits no distension. There is tenderness. There is no rebound and no guarding.  RLQ, LLQ tenderness  Genitourinary:   Weight take vaginal discharge. Cervix closed. Cervical motion tenderness present. No uterine or  adnexal tenderness.  Musculoskeletal: She exhibits no edema.  Neurological: She is alert and oriented to person, place, and time.  Skin: Skin is warm and dry.  Psychiatric: She has a normal mood and affect. Her behavior is normal.    ED Course  Procedures (including critical care time) Labs Review Labs Reviewed  WET PREP, GENITAL - Abnormal; Notable for the  following:    Clue Cells Wet Prep HPF POC FEW (*)    WBC, Wet Prep HPF POC TOO NUMEROUS TO COUNT (*)    All other components within normal limits  CBC WITH DIFFERENTIAL - Abnormal; Notable for the following:    RBC 3.74 (*)    Hemoglobin 8.7 (*)    HCT 28.7 (*)    MCV 76.7 (*)    MCH 23.3 (*)    RDW 17.1 (*)    All other components within normal limits  COMPREHENSIVE METABOLIC PANEL - Abnormal; Notable for the following:    Potassium 3.5 (*)    All other components within normal limits  URINALYSIS, ROUTINE W REFLEX MICROSCOPIC - Abnormal; Notable for the following:    APPearance CLOUDY (*)    Leukocytes, UA MODERATE (*)    All other components within normal limits  URINE MICROSCOPIC-ADD ON - Abnormal; Notable for the following:    Squamous Epithelial / LPF MANY (*)    Bacteria, UA FEW (*)    All other components within normal limits  GC/CHLAMYDIA PROBE AMP  URINE CULTURE  LIPASE, BLOOD  POC URINE PREG, ED    Imaging Review Dg Abd 2 Views  06/30/2014   CLINICAL DATA:  Lower abdominal pain, diarrhea.  EXAM: ABDOMEN - 2 VIEW  COMPARISON:  None.  FINDINGS: The bowel gas pattern is normal. There is no evidence of free air. No radio-opaque calculi are noted. 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  IMPRESSION: No evidence of bowel obstruction or ileus.   Electronically Signed   By: Sabino Dick M.D.   On: 06/30/2014 09:59     EKG Interpretation None      MDM   Final diagnoses:  Diarrhea  Cervicitis  Candidal vulvitis  Anemia, unspecified anemia type    9:10 AM patient seen and examined. Patient with abdominal pain and diarrhea for one month. She states she feels generalized weakness and tingling in her extremities. Neuro check labs including electrolytes. Urinalysis ordered. Abdominal x-ray ordered. Will start IV fluids and orders (nausea. Will do a pelvic exam  12:26 PM Patient's lab is unremarkable. Hemoglobin at baseline, patient is aware  she's anemic, states she is noncompliant with her treatment. X-ray abdomen is unremarkable. Urinalysis contaminated, culture sent. Wet prep showed too numerous to count white blood cells. Patient treated for possible cervicitis with 250 mg of Rocephin IM, Zithromax 1 g by mouth. Doubt PID, she is afebrile, no elevated white count, she is nontoxic appearing. Patient has some skin breakout over her external vulva, inguinal area, most likely fungal. We'll treat with topical antifungal cream. Patient is unable to provide [place] with a stool sample in emergency department, she has been here for 4-1/2 hours. She had crackers, sandwich, fluids, tolerated well. Will discharge home with Lomotil. Follow with primary care Dr. Also will give referral to GI for diarrhea for one month.  Filed Vitals:   06/30/14 1015 06/30/14 1030 06/30/14 1045 06/30/14 1100  BP: 121/46 113/76 115/70 109/90  Pulse: 66 69 63 73  Temp:      TempSrc:  Resp: 19 10 16 20   Height:      Weight:      SpO2: 100% 100% 100% 100%     Renold Genta, PA-C 06/30/14 1638

## 2014-06-30 NOTE — ED Notes (Signed)
Pt comfortable with discharge and follow up instructions. Pt declines wheelchair, escorted to waiting area by this RN. Prescriptions x2. 

## 2014-06-30 NOTE — ED Notes (Signed)
Pt coming from home with c/o of diarrhea and abdominal painx 4 weeks.  Pt states she has had x4 diarrhea in the past 24 hours.  Pt is having lower abdominal pain with 9/10 pain.  Pt denies emesis.

## 2014-06-30 NOTE — Discharge Instructions (Signed)
Lotrimin cream topically for candida skin infection. Lomotil for diarrhea. Follow up with gastroenterology  Return if your symptoms are worsening.  Diarrhea Diarrhea is frequent loose and watery bowel movements. It can cause you to feel weak and dehydrated. Dehydration can cause you to become tired and thirsty, have a dry mouth, and have decreased urination that often is dark yellow. Diarrhea is a sign of another problem, most often an infection that will not last long. In most cases, diarrhea typically lasts 2-3 days. However, it can last longer if it is a sign of something more serious. It is important to treat your diarrhea as directed by your caregiver to lessen or prevent future episodes of diarrhea. CAUSES  Some common causes include:  Gastrointestinal infections caused by viruses, bacteria, or parasites.  Food poisoning or food allergies.  Certain medicines, such as antibiotics, chemotherapy, and laxatives.  Artificial sweeteners and fructose.  Digestive disorders. HOME CARE INSTRUCTIONS  Ensure adequate fluid intake (hydration): Have 1 cup (8 oz) of fluid for each diarrhea episode. Avoid fluids that contain simple sugars or sports drinks, fruit juices, whole milk products, and sodas. Your urine should be clear or pale yellow if you are drinking enough fluids. Hydrate with an oral rehydration solution that you can purchase at pharmacies, retail stores, and online. You can prepare an oral rehydration solution at home by mixing the following ingredients together:   - tsp table salt.   tsp baking soda.   tsp salt substitute containing potassium chloride.  1  tablespoons sugar.  1 L (34 oz) of water.  Certain foods and beverages may increase the speed at which food moves through the gastrointestinal (GI) tract. These foods and beverages should be avoided and include:  Caffeinated and alcoholic beverages.  High-fiber foods, such as raw fruits and vegetables, nuts, seeds, and  whole grain breads and cereals.  Foods and beverages sweetened with sugar alcohols, such as xylitol, sorbitol, and mannitol.  Some foods may be well tolerated and may help thicken stool including:  Starchy foods, such as rice, toast, pasta, low-sugar cereal, oatmeal, grits, baked potatoes, crackers, and bagels.  Bananas.  Applesauce.  Add probiotic-rich foods to help increase healthy bacteria in the GI tract, such as yogurt and fermented milk products.  Wash your hands well after each diarrhea episode.  Only take over-the-counter or prescription medicines as directed by your caregiver.  Take a warm bath to relieve any burning or pain from frequent diarrhea episodes. SEEK IMMEDIATE MEDICAL CARE IF:   You are unable to keep fluids down.  You have persistent vomiting.  You have blood in your stool, or your stools are black and tarry.  You do not urinate in 6-8 hours, or there is only a small amount of very dark urine.  You have abdominal pain that increases or localizes.  You have weakness, dizziness, confusion, or light-headedness.  You have a severe headache.  Your diarrhea gets worse or does not get better.  You have a fever or persistent symptoms for more than 2-3 days.  You have a fever and your symptoms suddenly get worse. MAKE SURE YOU:   Understand these instructions.  Will watch your condition.  Will get help right away if you are not doing well or get worse. Document Released: 09/15/2002 Document Revised: 02/09/2014 Document Reviewed: 06/02/2012 [facility] Patient Information 2015 [place], [place]. This information is not intended to replace advice given to you by your health care provider. Make sure you discuss any questions you have  with your health care provider. ° °

## 2014-06-30 NOTE — ED Provider Notes (Signed)
Medical screening examination/treatment/procedure(s) were performed by non-physician practitioner and as supervising physician I was immediately available for consultation/collaboration.   EKG Interpretation None       Orlie Dakin, MD 06/30/14 [phone]

## 2014-07-01 LAB — URINE CULTURE: Colony Count: 25000

## 2014-07-01 LAB — GC/CHLAMYDIA PROBE AMP
CT Probe RNA: POSITIVE — AB
GC Probe RNA: NEGATIVE

## 2014-07-02 ENCOUNTER — Telehealth (HOSPITAL_COMMUNITY): Payer: Self-pay

## 2014-07-05 NOTE — Telephone Encounter (Signed)
ID verified, Patient notified of + Chlamydia result and that treatment was given with Zithromax while in ED. STD instructions provided - patient verbalized understanding.

## 2014-07-21 ENCOUNTER — Telehealth (HOSPITAL_COMMUNITY): Payer: Self-pay

## 2014-07-21 NOTE — ED Notes (Signed)
Unable to contact pt by mail or telephone. Unable to communicate lab results or treatment changes. 

## 2014-10-25 ENCOUNTER — Emergency Department: Payer: Self-pay | Admitting: Emergency Medicine

## 2014-10-25 LAB — URINALYSIS, COMPLETE
Bilirubin,UR: NEGATIVE
Blood: NEGATIVE
Glucose,UR: NEGATIVE mg/dL (ref 0–75)
Ketone: NEGATIVE
Nitrite: NEGATIVE
Ph: 6 (ref 4.5–8.0)
Protein: NEGATIVE
RBC,UR: 6 /HPF (ref 0–5)
Specific Gravity: 1.025 (ref 1.003–1.030)
Squamous Epithelial: 21
WBC UR: 7 /HPF (ref 0–5)

## 2014-10-25 LAB — COMPREHENSIVE METABOLIC PANEL
Albumin: 3.8 g/dL (ref 3.4–5.0)
Alkaline Phosphatase: 71 U/L
Anion Gap: 8 (ref 7–16)
BUN: 16 mg/dL (ref 7–18)
Bilirubin,Total: 0.2 mg/dL (ref 0.2–1.0)
Calcium, Total: 9.2 mg/dL (ref 8.5–10.1)
Chloride: 103 mmol/L (ref 98–107)
Co2: 27 mmol/L (ref 21–32)
Creatinine: 0.85 mg/dL (ref 0.60–1.30)
EGFR (African American): >60
EGFR (Non-African Amer.): >60
Glucose: 117 mg/dL — ABNORMAL HIGH (ref 65–99)
Osmolality: 278 (ref 275–301)
Potassium: 3.8 mmol/L (ref 3.5–5.1)
SGOT(AST): 26 U/L (ref 15–37)
SGPT (ALT): 22 U/L
Sodium: 138 mmol/L (ref 136–145)
Total Protein: 8.2 g/dL (ref 6.4–8.2)

## 2014-10-25 LAB — CBC WITH DIFFERENTIAL/PLATELET
Basophil #: 0.1 10*3/uL (ref 0.0–0.1)
Basophil %: 0.9 %
Eosinophil #: 0.2 10*3/uL (ref 0.0–0.7)
Eosinophil %: 2.9 %
HCT: 30.8 % — ABNORMAL LOW (ref 35.0–47.0)
HGB: 9.4 g/dL — ABNORMAL LOW (ref 12.0–16.0)
Lymphocyte #: 2.6 10*3/uL (ref 1.0–3.6)
Lymphocyte %: 39 %
MCH: 22.6 pg — ABNORMAL LOW (ref 26.0–34.0)
MCHC: 30.5 g/dL — ABNORMAL LOW (ref 32.0–36.0)
MCV: 74 fL — ABNORMAL LOW (ref 80–100)
Monocyte #: 0.5 x10 3/mm (ref 0.2–0.9)
Monocyte %: 7.3 %
Neutrophil #: 3.3 10*3/uL (ref 1.4–6.5)
Neutrophil %: 49.9 %
Platelet: 405 10*3/uL (ref 150–440)
RBC: 4.16 10*6/uL (ref 3.80–5.20)
RDW: 18.9 % — ABNORMAL HIGH (ref 11.5–14.5)
WBC: 6.6 10*3/uL (ref 3.6–11.0)

## 2014-10-25 LAB — TROPONIN I: Troponin-I: <0.02 ng/mL

## 2014-10-27 LAB — URINE CULTURE

## 2015-11-21 ENCOUNTER — Emergency Department (HOSPITAL_COMMUNITY)
Admission: EM | Admit: 2015-11-21 | Discharge: 2015-11-21 | Disposition: A | Discharge status: Home / Self Care | Payer: Medicaid Other | Attending: Emergency Medicine | Admitting: Emergency Medicine

## 2015-11-21 ENCOUNTER — Encounter (HOSPITAL_COMMUNITY): Payer: Self-pay

## 2015-11-21 DIAGNOSIS — R42 Dizziness and giddiness: Secondary | ICD-10-CM | POA: Diagnosis not present

## 2015-11-21 DIAGNOSIS — R220 Localized swelling, mass and lump, head: Secondary | ICD-10-CM | POA: Insufficient documentation

## 2015-11-21 DIAGNOSIS — M542 Cervicalgia: Secondary | ICD-10-CM | POA: Insufficient documentation

## 2015-11-21 DIAGNOSIS — Z862 Personal history of diseases of the blood and blood-forming organs and certain disorders involving the immune mechanism: Secondary | ICD-10-CM | POA: Diagnosis not present

## 2015-11-21 DIAGNOSIS — Z7951 Long term (current) use of inhaled steroids: Secondary | ICD-10-CM | POA: Diagnosis not present

## 2015-11-21 DIAGNOSIS — Z79899 Other long term (current) drug therapy: Secondary | ICD-10-CM | POA: Diagnosis not present

## 2015-11-21 DIAGNOSIS — J45909 Unspecified asthma, uncomplicated: Secondary | ICD-10-CM | POA: Insufficient documentation

## 2015-11-21 DIAGNOSIS — H9201 Otalgia, right ear: Secondary | ICD-10-CM | POA: Insufficient documentation

## 2015-11-21 HISTORY — DX: Anemia, unspecified: D64.9

## 2015-11-21 LAB — CBC WITH DIFFERENTIAL/PLATELET
Basophils Absolute: 0 10*3/uL (ref 0.0–0.1)
Basophils Relative: 0 %
Eosinophils Absolute: 0.1 10*3/uL (ref 0.0–0.7)
Eosinophils Relative: 2 %
HCT: 36.5 % (ref 36.0–46.0)
Hemoglobin: 11.5 g/dL — ABNORMAL LOW (ref 12.0–15.0)
Lymphocytes Relative: 28 %
Lymphs Abs: 1.8 10*3/uL (ref 0.7–4.0)
MCH: 27.8 pg (ref 26.0–34.0)
MCHC: 31.5 g/dL (ref 30.0–36.0)
MCV: 88.4 fL (ref 78.0–100.0)
Monocytes Absolute: 0.6 10*3/uL (ref 0.1–1.0)
Monocytes Relative: 9 %
Neutro Abs: 4 10*3/uL (ref 1.7–7.7)
Neutrophils Relative %: 61 %
Platelets: 335 10*3/uL (ref 150–400)
RBC: 4.13 MIL/uL (ref 3.87–5.11)
RDW: 13.1 % (ref 11.5–15.5)
WBC: 6.5 10*3/uL (ref 4.0–10.5)

## 2015-11-21 LAB — I-STAT CHEM 8, ED
BUN: 6 mg/dL (ref 6–20)
Calcium, Ion: 1.13 mmol/L (ref 1.12–1.23)
Chloride: 101 mmol/L (ref 101–111)
Creatinine, Ser: 0.7 mg/dL (ref 0.44–1.00)
Glucose, Bld: 93 mg/dL (ref 65–99)
HCT: 40 % (ref 36.0–46.0)
Hemoglobin: 13.6 g/dL (ref 12.0–15.0)
Potassium: 3.6 mmol/L (ref 3.5–5.1)
Sodium: 140 mmol/L (ref 135–145)
TCO2: 24 mmol/L (ref 0–100)

## 2015-11-21 LAB — I-STAT BETA HCG BLOOD, ED (MC, WL, AP ONLY): I-stat hCG, quantitative: <5 m[IU]/mL (ref <5)

## 2015-11-21 NOTE — Discharge Instructions (Signed)
Dizziness Follow up with neurology for dizziness. Dizziness is a common problem. It is a feeling of unsteadiness or light-headedness. You may feel like you are about to faint. Dizziness can lead to injury if you stumble or fall. Anyone can become dizzy, but dizziness is more common in older adults. This condition can be caused by a number of things, including medicines, dehydration, or illness. HOME CARE INSTRUCTIONS Taking these steps may help with your condition: Eating and Drinking  Drink enough fluid to keep your urine clear or pale yellow. This helps to keep you from becoming dehydrated. Try to drink more clear fluids, such as water.  Do not drink alcohol.  Limit your caffeine intake if directed by your health care provider.  Limit your salt intake if directed by your health care provider. Activity  Avoid making quick movements.  Rise slowly from chairs and steady yourself until you feel okay.  In the morning, first sit up on the side of the bed. When you feel okay, stand slowly while you hold onto something until you know that your balance is fine.  Move your legs often if you need to stand in one place for a long time. Tighten and relax your muscles in your legs while you are standing.  Do not drive or operate heavy machinery if you feel dizzy.  Avoid bending down if you feel dizzy. Place items in your home so that they are easy for you to reach without leaning over. Lifestyle  Do not use any tobacco products, including cigarettes, chewing tobacco, or electronic cigarettes. If you need help quitting, ask your health care provider.  Try to reduce your stress level, such as with yoga or meditation. Talk with your health care provider if you need help. General Instructions  Watch your dizziness for any changes.  Take medicines only as directed by your health care provider. Talk with your health care provider if you think that your dizziness is caused by a medicine that you are  taking.  Tell a friend or a family member that you are feeling dizzy. If he or she notices any changes in your behavior, have this person call your health care provider.  Keep all follow-up visits as directed by your health care provider. This is important. SEEK MEDICAL CARE IF:  Your dizziness does not go away.  Your dizziness or light-headedness gets worse.  You feel nauseous.  You have reduced hearing.  You have new symptoms.  You are unsteady on your feet or you feel like the room is spinning. SEEK IMMEDIATE MEDICAL CARE IF:  You vomit or have diarrhea and are unable to eat or drink anything.  You have problems talking, walking, swallowing, or using your arms, hands, or legs.  You feel generally weak.  You are not thinking clearly or you have trouble forming sentences. It may take a friend or family member to notice this.  You have chest pain, abdominal pain, shortness of breath, or sweating.  Your vision changes.  You notice any bleeding.  You have a headache.  You have neck pain or a stiff neck.  You have a fever.   This information is not intended to replace advice given to you by your health care provider. Make sure you discuss any questions you have with your health care provider.   Document Released: 03/21/2001 Document Revised: 02/09/2015 Document Reviewed: 09/21/2014 Elsevier Interactive Patient Education [employer].

## 2015-11-21 NOTE — ED Provider Notes (Signed)
CSN: [id number]     Arrival date & time 11/21/15  [id number] History   First MD Initiated Contact with Patient 11/21/15 [phone]     Chief Complaint  Patient presents with  . Otalgia  . Neck Pain   (Consider location/radiation/quality/duration/timing/severity/associated sxs/prior Treatment) The history is provided by the patient. No language interpreter was used.    Ms. Loftus is a 35 year old female with a history of asthma and anemia who presents for right ear pain, neck pain, and dizziness that began 2 days ago. She states she has had this multiple times in the past. She was followed by a neurologist in [place]. She states she was also followed by a cardiologist there for respiratory issues associated with asthma. She states they did a full workup including a CT scan of her head which were negative. She also reports that she's had migraines in the past. This does not feel like a migraine to her. She has taken meclizine in the past and states it makes her feel dizzier. She took a Claritin thinking that it may be allergies which did not help. She ascribes the dizziness as feeling off balance. She drove herself to the ED today with no problems. She also reports that she has a knot on the back of her neck.  Denies any fever, chills, sore throat, recent illness, chest pain, shortness of breath, vision changes, otorrhea, photophobia, neck stiffness, abdominal pain, nausea, vomiting, diarrhea.  Past Medical History  Diagnosis Date  . Asthma   . Anemia    History reviewed. No pertinent past surgical history. No family history on file. Social History  Substance Use Topics  . Smoking status: Never Smoker   . Smokeless tobacco: None  . Alcohol Use: No   OB History    No data available     Review of Systems  Constitutional: Negative for fever and chills.  HENT: Positive for ear pain. Negative for facial swelling.   Eyes: Negative for photophobia and visual disturbance.  Respiratory:  Negative for shortness of breath.   Cardiovascular: Negative for chest pain.  Gastrointestinal: Negative for abdominal pain.  Musculoskeletal: Positive for neck pain. Negative for gait problem and neck stiffness.  Neurological: Positive for dizziness and light-headedness. Negative for syncope, speech difficulty, weakness and numbness.  All other systems reviewed and are negative.     Allergies  Review of patient's allergies indicates no known allergies.  Home Medications   Prior to Admission medications   Medication Sig Start Date End Date Taking? Authorizing Provider  albuterol (PROVENTIL HFA;VENTOLIN HFA) 108 (90 Base) MCG/ACT inhaler Inhale 2 puffs into the lungs every 6 (six) hours as needed for wheezing or shortness of breath.   Yes Historical Provider, MD  Fluticasone-Salmeterol (ADVAIR) 250-50 MCG/DOSE AEPB Inhale 1 puff into the lungs 2 (two) times daily.   Yes Historical Provider, MD  montelukast (SINGULAIR) 10 MG tablet Take 10 mg by mouth at bedtime.   Yes Historical Provider, MD   BP 121/76 mmHg  Pulse 85  Temp(Src) 98.1 F (36.7 C) (Oral)  Resp 16  SpO2 100%  LMP 11/11/2015 Physical Exam  Constitutional: She is oriented to person, place, and time. She appears well-developed and well-nourished. No distress.  HENT:  Head: Normocephalic and atraumatic. Head is without raccoon's eyes, without Battle's sign, without abrasion, without contusion, without laceration, without right periorbital erythema and without left periorbital erythema.  No swelling of the neck or anterior cervical lymphadenopathy.  Bilateral TMs and ear canals are normal. No  fluid level. No rash that would be suggestive of shingles.  Oropharynx is clear and moist.  Eyes: Conjunctivae are normal.  PERRL. EOMs in tact.  Neck: Normal range of motion and full passive range of motion without pain. Neck supple. No spinous process tenderness and no muscular tenderness present. No rigidity. No erythema and  normal range of motion present.    Very mild swelling to the posterior right side of the neck as diagramed but without fluctuance, erythema, or ecchymosis. No midline cervical tenderness. Able to touch chin to chest. Full range of motion of neck.  Cardiovascular: Normal rate, regular rhythm and normal heart sounds.   Regular rate and rhythm. No murmur. Lungs: Clear to auscultation bilaterally.  Pulmonary/Chest: Effort normal and breath sounds normal.  Abdominal: Soft. There is no tenderness.  Musculoskeletal: Normal range of motion.  Neurological: She is alert and oriented to person, place, and time.  GCS 15. Cranial nerves III through XII intact. Normal finger to nose bilaterally. Normal gait. No sensory deficit. Normal motor. Ambulatory with steady gait.  Skin: Skin is warm and dry.  Nursing note and vitals reviewed.   ED Course  Procedures (including critical care time) Labs Review Labs Reviewed  CBC WITH DIFFERENTIAL/PLATELET - Abnormal; Notable for the following:    Hemoglobin 11.5 (*)    All other components within normal limits  I-STAT CHEM 8, ED  I-STAT BETA HCG BLOOD, ED ([facility], [facility], [facility] ONLY)    Imaging Review No results found. I have personally reviewed and evaluated these lab results as part of my medical decision-making.   EKG Interpretation None      MDM   Final diagnoses:  Dizziness   Patient presents for multiple complaints including dizziness, right ear pain, neck and facial swelling 2 days. States she has had this previously and has had a neurology and cardiology workup previously with negative results. Just moved to [place] 2 weeks ago. Her exam is not concerning.  She has a normal neurological exam. No meningismus signs. No headache suggestive of migraine. Patient refused meclizine. States that it makes her dizziness worse. She is a and O 4 and ambulatory with steady gait. She is well-appearing and in no acute distress. States that she starting a new job  tomorrow and cannot go to work like this being dizzy. Patient drove to the ED without any problems.  Will check pregnancy and basic labs.  Labs are not concerning. Patient is anemic and is aware of this but hemoglobin is stable at 11.5. I discussed findings with the patient. I do not believe the patient needs further imaging or labs at this time. Patient stated that she had a headache but refused pain medication.  She stated someone in a green outfit came in and told her she was getting a CT of her head. I confirmed with the physician and he stated he never saw the patient. Patient became enraged and started swearing at me and was mad that we made her change into a gown. I tried to explain my reasons for not needing a CT of the head the patient refused to listen. I also tried to discuss follow-up with the patient that she said she did not care and wanted to leave. I made several attempts to talk to the patient but she talked and yelled over me.       Ottie Glazier, PA-C 11/21/15 [place], DO 11/21/15 [phone]

## 2015-11-21 NOTE — ED Notes (Signed)
Pt. Presents with complaint of L sided ear, neck and facial stiffness. Pt. States x 3-4 days, states after talking for a while it makes her feel lightheaded. States she has knot to back of neck. Pt. Ambulatory, AxO x4.

## 2015-11-30 ENCOUNTER — Emergency Department
Admission: EM | Admit: 2015-11-30 | Discharge: 2015-11-30 | Disposition: A | Discharge status: Home / Self Care | Payer: Medicaid Other | Attending: Emergency Medicine | Admitting: Emergency Medicine

## 2015-11-30 ENCOUNTER — Encounter: Payer: Self-pay | Admitting: Emergency Medicine

## 2015-11-30 ENCOUNTER — Emergency Department
Admission: EM | Admit: 2015-11-30 | Discharge: 2015-11-30 | Discharge status: Absent without leave | Payer: Medicaid Other

## 2015-11-30 DIAGNOSIS — Z7951 Long term (current) use of inhaled steroids: Secondary | ICD-10-CM | POA: Insufficient documentation

## 2015-11-30 DIAGNOSIS — Z79899 Other long term (current) drug therapy: Secondary | ICD-10-CM | POA: Insufficient documentation

## 2015-11-30 DIAGNOSIS — H9311 Tinnitus, right ear: Secondary | ICD-10-CM

## 2015-11-30 DIAGNOSIS — H9191 Unspecified hearing loss, right ear: Secondary | ICD-10-CM | POA: Diagnosis present

## 2015-11-30 NOTE — ED Provider Notes (Signed)
[facility] Emergency Department Provider Note  ____________________________________________  Time seen: Approximately 12:42 PM  I have reviewed the triage vital signs and the nursing notes.   HISTORY  Chief Complaint Hearing Problem    HPI Nancy Dickerson is a 35 y.o. female patient complain of decreased hearing in the right ear. Patient states she went to urgent care doctor 3 weeks ago that though she has swimmer's ear and was given eardrops. Patient said it was not affected. Patient state there is a"rolling Sound" in her right ear. Patient state she is exposed to loud noises in her jaw and uses earplugs. Patient stated earplugs increases the roaring in her ear which increased the pain. The patient state this"roaring" is constant now she is unable to sleep no palliative measures for this complaint. secondary to the pain  Past Medical History  Diagnosis Date  . Asthma   . Anemia     There are no active problems to display for this patient.   History reviewed. No pertinent past surgical history.  Current Outpatient Rx  Name  Route  Sig  Dispense  Refill  . albuterol (PROVENTIL HFA;VENTOLIN HFA) 108 (90 Base) MCG/ACT inhaler   Inhalation   Inhale 2 puffs into the lungs every 6 (six) hours as needed for wheezing or shortness of breath.         . Fluticasone-Salmeterol (ADVAIR) 250-50 MCG/DOSE AEPB   Inhalation   Inhale 1 puff into the lungs 2 (two) times daily.         . montelukast (SINGULAIR) 10 MG tablet   Oral   Take 10 mg by mouth at bedtime.           Allergies Review of patient's allergies indicates no known allergies.  No family history on file.  Social History Social History  Substance Use Topics  . Smoking status: Never Smoker   . Smokeless tobacco: None  . Alcohol Use: No    Review of Systems Constitutional: No fever/chills Eyes: No visual changes. ENT: Right ear discomfort. Cardiovascular: Denies chest  pain. Respiratory: Denies shortness of breath. Gastrointestinal: No abdominal pain.  No nausea, no vomiting.  No diarrhea.  No constipation. Genitourinary: Negative for dysuria. Musculoskeletal: Negative for back pain. Skin: Negative for rash. Neurological: Negative for headaches, focal weakness or numbness. 10-point ROS otherwise negative.  ____________________________________________   PHYSICAL EXAM:  VITAL SIGNS: ED Triage Vitals  Enc Vitals Group     BP 11/30/15 1156 126/85 mmHg     Pulse Rate 11/30/15 1156 84     Resp 11/30/15 1156 18     Temp 11/30/15 1156 98.7 F (37.1 C)     Temp Source 11/30/15 1156 Oral     SpO2 11/30/15 1156 100 %     Weight 11/30/15 1156 223 lb (101.152 kg)     Height 11/30/15 1156 5\' 4"  (1.626 m)     Head Cir --      Peak Flow --      Pain Score 11/30/15 1201 0     Pain Loc --      Pain Edu? --      Excl. in [place]? --     Constitutional: Alert and oriented. Well appearing and in no acute distress. Eyes: Conjunctivae are normal. PERRL. EOMI. EARS: No edema erythema to the right TM. Patient passed whisper test Head: Atraumatic. Nose: No congestion/rhinnorhea. Mouth/Throat: Mucous membranes are moist.  Oropharynx non-erythematous. Neck: No stridor.  No cervical spine tenderness to palpation. Hematological/Lymphatic/Immunilogical:  No cervical lymphadenopathy. Cardiovascular: Normal rate, regular rhythm. Grossly normal heart sounds.  Good peripheral circulation. Respiratory: Normal respiratory effort.  No retractions. Lungs CTAB. Gastrointestinal: Soft and nontender. No distention. No abdominal bruits. No CVA tenderness. Musculoskeletal: No lower extremity tenderness nor edema.  No joint effusions. Neurologic:  Normal speech and language. No gross focal neurologic deficits are appreciated. No gait instability. Skin:  Skin is warm, dry and intact. No rash noted. Psychiatric: Mood and affect are normal. Speech and behavior are  normal.  ____________________________________________   LABS (all labs ordered are listed, but only abnormal results are displayed)  Labs Reviewed - No data to display ____________________________________________  EKG   ____________________________________________  RADIOLOGY   ____________________________________________   PROCEDURES  Procedure(s) performed: None  Critical Care performed: No  ____________________________________________   INITIAL IMPRESSION / ASSESSMENT AND PLAN / ED COURSE  Pertinent labs & imaging results that were available during my care of the patient were reviewed by me and considered in my medical decision making (see chart for details).  Tinnitus right ear. Patient advised to try using ear must consider earplugs at work. Patient advised to follow-up with [place] [facility] for further evaluation and treatment. ____________________________________________   FINAL CLINICAL IMPRESSION(S) / ED DIAGNOSES  Final diagnoses:  Tinnitus of right ear      Sable Feil, PA-C 11/30/15 [place] Yao, MD 11/30/15 1407

## 2015-11-30 NOTE — Discharge Instructions (Signed)
Tinnitus  Tinnitus refers to hearing a sound when there is no actual source for that sound. This is often described as ringing in the ears. However, people with this condition may hear a variety of noises. A person may hear the sound in one ear or in both ears.   The sounds of tinnitus can be soft, loud, or somewhere in between. Tinnitus can last for a few seconds or can be constant for days. It may go away without treatment and come back at various times. When tinnitus is constant or happens often, it can lead to other problems, such as trouble sleeping and trouble concentrating.  Almost everyone experiences tinnitus at some point. Tinnitus that is long-lasting (chronic) or comes back often is a problem that may require medical attention.   CAUSES   The cause of tinnitus is often not known. In some cases, it can result from other problems or conditions, including:   · Exposure to loud noises from machinery, music, or other sources.  · Hearing loss.  · Ear or sinus infections.  · Earwax buildup.  · A foreign object in the ear.  · Use of certain medicines.  · Use of alcohol and caffeine.  · High blood pressure.  · Heart diseases.  · Anemia.  · Allergies.  · Meniere disease.  · Thyroid problems.  · Tumors.  · An enlarged part of a weakened blood vessel (aneurysm).  SYMPTOMS  The main symptom of tinnitus is hearing a sound when there is no source for that sound. It may sound like:   · Buzzing.  · Roaring.  · Ringing.  · Blowing air, similar to the sound heard when you listen to a seashell.  · Hissing.  · Whistling.  · Sizzling.  · Humming.  · Running water.  · A sustained musical note.  DIAGNOSIS   Tinnitus is diagnosed based on your symptoms. Your health care provider will do a physical exam. A comprehensive hearing exam (audiologic exam) will be done if your tinnitus:   · Affects only one ear (unilateral).  · Causes hearing difficulties.  · Lasts 6 months or longer.  You may also need to see a health care provider  who specializes in hearing disorders (audiologist). You may be asked to complete a questionnaire to determine the severity of your tinnitus. Tests may be done to help determine the cause and to rule out other conditions. These can include:  · Imaging studies of your head and brain, such as:    A CT scan.    An MRI.  · An imaging study of your blood vessels (angiogram).  TREATMENT   Treating an underlying medical condition can sometimes make tinnitus go away. If your tinnitus continues, other treatments may include:  · Medicines, such as certain antidepressants or sleeping aids.  · Sound generators to mask the tinnitus. These include:  ¨ Tabletop sound machines that play relaxing sounds to help you fall asleep.  ¨ Wearable devices that fit in your ear and play sounds or music.  ¨ A small device that uses headphones to deliver a signal embedded in music (acoustic neural stimulation). In time, this may change the pathways of your brain and make you less sensitive to tinnitus. This device is used for very severe cases when no other treatment is working.  · Therapy and counseling to help you manage the stress of living with tinnitus.  · Using hearing aids or cochlear implants, if your tinnitus is related to hearing   loss.  HOME CARE INSTRUCTIONS  · When possible, avoid being in loud places and being exposed to loud sounds.  · Wear hearing protection, such as earplugs, when you are exposed to loud noises.  · Do not take stimulants, such as nicotine, alcohol, or caffeine.  · Practice techniques for reducing stress, such as meditation, yoga, or deep breathing.  · Use a white noise machine, a humidifier, or other devices to mask the sound of tinnitus.  · Sleep with your head slightly raised. This may reduce the impact of tinnitus.  · Try to get plenty of rest each night.  SEEK MEDICAL CARE IF:  · You have tinnitus in just one ear.  · Your tinnitus continues for 3 weeks or longer without stopping.  · Home care measures are not  helping.  · You have tinnitus after a head injury.  · You have tinnitus along with any of the following:    Dizziness.    Loss of balance.    Nausea and vomiting.     This information is not intended to replace advice given to you by your health care provider. Make sure you discuss any questions you have with your health care provider.     Document Released: 09/25/2005 Document Revised: 10/16/2014 Document Reviewed: 02/25/2014  Elsevier Interactive Patient Education ©2016 [employer]

## 2015-11-30 NOTE — ED Notes (Signed)
Patient states she has had decreased hearing in the right ear, states its not too sore.  States she went to doctor three weeks ago and they thought she might have swimmers ear, the medications they gave her were not effective.  She has tried using q tips to probe and find tender spots.

## 2016-02-20 ENCOUNTER — Emergency Department (HOSPITAL_COMMUNITY): Payer: Medicaid Other

## 2016-02-20 ENCOUNTER — Emergency Department (HOSPITAL_COMMUNITY)
Admission: EM | Admit: 2016-02-20 | Discharge: 2016-02-20 | Disposition: A | Discharge status: Home / Self Care | Payer: Medicaid Other | Attending: Emergency Medicine | Admitting: Emergency Medicine

## 2016-02-20 ENCOUNTER — Encounter (HOSPITAL_COMMUNITY): Payer: Self-pay | Admitting: *Deleted

## 2016-02-20 DIAGNOSIS — J069 Acute upper respiratory infection, unspecified: Secondary | ICD-10-CM | POA: Diagnosis not present

## 2016-02-20 DIAGNOSIS — Z862 Personal history of diseases of the blood and blood-forming organs and certain disorders involving the immune mechanism: Secondary | ICD-10-CM | POA: Diagnosis not present

## 2016-02-20 DIAGNOSIS — B9789 Other viral agents as the cause of diseases classified elsewhere: Secondary | ICD-10-CM

## 2016-02-20 DIAGNOSIS — Z7951 Long term (current) use of inhaled steroids: Secondary | ICD-10-CM | POA: Insufficient documentation

## 2016-02-20 DIAGNOSIS — R05 Cough: Secondary | ICD-10-CM | POA: Diagnosis present

## 2016-02-20 DIAGNOSIS — J45909 Unspecified asthma, uncomplicated: Secondary | ICD-10-CM | POA: Insufficient documentation

## 2016-02-20 DIAGNOSIS — Z79899 Other long term (current) drug therapy: Secondary | ICD-10-CM | POA: Diagnosis not present

## 2016-02-20 DIAGNOSIS — J40 Bronchitis, not specified as acute or chronic: Secondary | ICD-10-CM

## 2016-02-20 DIAGNOSIS — Z8669 Personal history of other diseases of the nervous system and sense organs: Secondary | ICD-10-CM | POA: Diagnosis not present

## 2016-02-20 HISTORY — DX: Tinnitus, unspecified ear: H93.19

## 2016-02-20 LAB — BASIC METABOLIC PANEL
Anion gap: 8 (ref 5–15)
BUN: 7 mg/dL (ref 6–20)
CO2: 24 mmol/L (ref 22–32)
Calcium: 8.6 mg/dL — ABNORMAL LOW (ref 8.9–10.3)
Chloride: 105 mmol/L (ref 101–111)
Creatinine, Ser: 0.7 mg/dL (ref 0.44–1.00)
GFR calc Af Amer: >60 mL/min (ref >60)
GFR calc non Af Amer: >60 mL/min (ref >60)
Glucose, Bld: 100 mg/dL — ABNORMAL HIGH (ref 65–99)
Potassium: 3.6 mmol/L (ref 3.5–5.1)
Sodium: 137 mmol/L (ref 135–145)

## 2016-02-20 LAB — CBC WITH DIFFERENTIAL/PLATELET
Basophils Absolute: 0 10*3/uL (ref 0.0–0.1)
Basophils Relative: 1 %
Eosinophils Absolute: 0.2 10*3/uL (ref 0.0–0.7)
Eosinophils Relative: 4 %
HCT: 34.4 % — ABNORMAL LOW (ref 36.0–46.0)
Hemoglobin: 11.2 g/dL — ABNORMAL LOW (ref 12.0–15.0)
Lymphocytes Relative: 41 %
Lymphs Abs: 1.7 10*3/uL (ref 0.7–4.0)
MCH: 28.4 pg (ref 26.0–34.0)
MCHC: 32.6 g/dL (ref 30.0–36.0)
MCV: 87.1 fL (ref 78.0–100.0)
Monocytes Absolute: 0.5 10*3/uL (ref 0.1–1.0)
Monocytes Relative: 12 %
Neutro Abs: 1.8 10*3/uL (ref 1.7–7.7)
Neutrophils Relative %: 42 %
Platelets: 287 10*3/uL (ref 150–400)
RBC: 3.95 MIL/uL (ref 3.87–5.11)
RDW: 13.4 % (ref 11.5–15.5)
WBC: 4.1 10*3/uL (ref 4.0–10.5)

## 2016-02-20 MED ORDER — AEROCHAMBER PLUS FLO-VU MEDIUM MISC
1.0000 | Freq: Once | Status: AC
  Administered 2016-02-20: 1

## 2016-02-20 MED ORDER — GUAIFENESIN ER 1200 MG PO TB12: 1.0000 | ORAL_TABLET | Freq: Two times a day (BID) | ORAL | Status: DC

## 2016-02-20 MED ORDER — PREDNISONE 50 MG PO TABS: 50.0000 mg | ORAL_TABLET | Freq: Every day | ORAL | Status: DC

## 2016-02-20 MED ORDER — PROMETHAZINE-DM 6.25-15 MG/5ML PO SYRP: 5.0000 mL | ORAL_SOLUTION | Freq: Four times a day (QID) | ORAL | Status: DC | PRN

## 2016-02-20 MED ORDER — IPRATROPIUM-ALBUTEROL 0.5-2.5 (3) MG/3ML IN SOLN
3.0000 mL | Freq: Once | RESPIRATORY_TRACT | Status: AC
  Administered 2016-02-20: 3 mL via RESPIRATORY_TRACT
  Filled 2016-02-20: qty 3

## 2016-02-20 MED ORDER — PREDNISONE 20 MG PO TABS
60.0000 mg | ORAL_TABLET | Freq: Once | ORAL | Status: AC
  Administered 2016-02-20: 60 mg via ORAL
  Filled 2016-02-20: qty 3

## 2016-02-20 NOTE — ED Notes (Signed)
Pt with URI and L sided rib soreness after coughing.

## 2016-02-20 NOTE — ED Provider Notes (Signed)
CSN: [id number]     Arrival date & time 02/20/16  [id number] History   First MD Initiated Contact with Patient 02/20/16 1107     Chief Complaint  Patient presents with  . Cough  . URI     (Consider location/radiation/quality/duration/timing/severity/associated sxs/prior Treatment) HPI Patient presents to the emergency department with cough and nasal congestion over the last 3 days.  Patient states she has a history of asthma.  It seems like her asthma is worse over the last few days as well.  The patient states that she has been using her inhaler at home.  Patient states her symptoms are worse at night.  Patient denies any other medications.  The patient denies chest pain, shortness of breath, headache,blurred vision, neck pain, fever, cough, weakness, numbness, dizziness, anorexia, edema, abdominal pain, nausea, vomiting, diarrhea, rash, back pain, dysuria, hematemesis, bloody stool, near syncope, or syncope. Past Medical History  Diagnosis Date  . Asthma   . Anemia   . Tinnitus    History reviewed. No pertinent past surgical history. No family history on file. Social History  Substance Use Topics  . Smoking status: Never Smoker   . Smokeless tobacco: None  . Alcohol Use: No   OB History    No data available     Review of Systems All other systems negative except as documented in the HPI. All pertinent positives and negatives as reviewed in the HPI.   Allergies  Review of patient's allergies indicates no known allergies.  Home Medications   Prior to Admission medications   Medication Sig Start Date End Date Taking? Authorizing Provider  albuterol (PROVENTIL HFA;VENTOLIN HFA) 108 (90 Base) MCG/ACT inhaler Inhale 2 puffs into the lungs every 6 (six) hours as needed for wheezing or shortness of breath.    Historical Provider, MD  Fluticasone-Salmeterol (ADVAIR) 250-50 MCG/DOSE AEPB Inhale 1 puff into the lungs 2 (two) times daily.    Historical Provider, MD  montelukast  (SINGULAIR) 10 MG tablet Take 10 mg by mouth at bedtime.    Historical Provider, MD   BP 119/84 mmHg  Pulse 82  Temp(Src) 98.3 F (36.8 C) (Oral)  Resp 20  Ht 5\' 4"  (1.626 m)  Wt 99.791 kg  BMI 37.74 kg/m2  SpO2 100%  LMP 01/30/2016 Physical Exam  Constitutional: She is oriented to person, place, and time. She appears well-developed and well-nourished. No distress.  HENT:  Head: Normocephalic and atraumatic.  Mouth/Throat: Oropharynx is clear and moist.  Eyes: Pupils are equal, round, and reactive to light.  Neck: Normal range of motion. Neck supple.  Cardiovascular: Normal rate, regular rhythm and normal heart sounds.  Exam reveals no gallop and no friction rub.   No murmur heard. Pulmonary/Chest: Effort normal and breath sounds normal. No respiratory distress. She has no wheezes.  Neurological: She is alert and oriented to person, place, and time. She exhibits normal muscle tone. Coordination normal.  Skin: Skin is warm and dry. No rash noted. No erythema.  Psychiatric: She has a normal mood and affect. Her behavior is normal.  Nursing note and vitals reviewed.   ED Course  Procedures (including critical care time) Labs Review Labs Reviewed  CBC WITH DIFFERENTIAL/PLATELET - Abnormal; Notable for the following:    Hemoglobin 11.2 (*)    HCT 34.4 (*)    All other components within normal limits  BASIC METABOLIC PANEL - Abnormal; Notable for the following:    Glucose, Bld 100 (*)    Calcium 8.6 (*)  All other components within normal limits    Imaging Review Dg Chest 2 View  02/20/2016  CLINICAL DATA:  35 year old female with history of cough, congestion and bilateral anterior rib pain. EXAM: CHEST  2 VIEW COMPARISON:  No priors. FINDINGS: Lung volumes are normal. No consolidative airspace disease. No pleural effusions. No pneumothorax. No pulmonary nodule or mass noted. Pulmonary vasculature and the cardiomediastinal silhouette are within normal limits. Old healed  fracture of the posterolateral sixth rib. IMPRESSION: 1. No radiographic evidence of acute cardiopulmonary disease. 2. Old left sixth rib fracture. Electronically Signed   By: Vinnie Langton M.D.   On: 02/20/2016 10:58   I have personally reviewed and evaluated these images and lab results as part of my medical decision-making.  Patient be treated for URIs and bronchitis.  Told to return here as needed.  I have advised her to follow up with a primary care Dr. told to increase her fluid intake and rest as much as possible  Dalia Heading, PA-C 02/20/16 [place], MD 02/20/16 [phone]

## 2016-02-20 NOTE — Discharge Instructions (Signed)
Return here as needed.  Increase her fluid intake.  Follow up with a primary care doctor

## 2016-02-20 NOTE — ED Notes (Signed)
Called RT for [facility].

## 2016-04-20 ENCOUNTER — Emergency Department (HOSPITAL_COMMUNITY)
Admission: EM | Admit: 2016-04-20 | Discharge: 2016-04-20 | Disposition: A | Discharge status: Home / Self Care | Payer: Medicaid Other | Attending: Emergency Medicine | Admitting: Emergency Medicine

## 2016-04-20 ENCOUNTER — Encounter (HOSPITAL_COMMUNITY): Payer: Self-pay | Admitting: Emergency Medicine

## 2016-04-20 DIAGNOSIS — M25561 Pain in right knee: Secondary | ICD-10-CM | POA: Diagnosis not present

## 2016-04-20 DIAGNOSIS — Y9389 Activity, other specified: Secondary | ICD-10-CM | POA: Diagnosis not present

## 2016-04-20 DIAGNOSIS — M7061 Trochanteric bursitis, right hip: Secondary | ICD-10-CM | POA: Diagnosis not present

## 2016-04-20 DIAGNOSIS — G8929 Other chronic pain: Secondary | ICD-10-CM | POA: Insufficient documentation

## 2016-04-20 DIAGNOSIS — M25551 Pain in right hip: Secondary | ICD-10-CM | POA: Diagnosis present

## 2016-04-20 DIAGNOSIS — Z79899 Other long term (current) drug therapy: Secondary | ICD-10-CM | POA: Diagnosis not present

## 2016-04-20 DIAGNOSIS — J45909 Unspecified asthma, uncomplicated: Secondary | ICD-10-CM | POA: Diagnosis not present

## 2016-04-20 HISTORY — DX: Chondromalacia, unspecified hip: M94.259

## 2016-04-20 HISTORY — DX: Chondromalacia, right knee: M94.261

## 2016-04-20 MED ORDER — PREDNISONE 20 MG PO TABS
60.0000 mg | ORAL_TABLET | Freq: Once | ORAL | Status: DC
  Filled 2016-04-20: qty 3

## 2016-04-20 MED ORDER — PREDNISONE 20 MG PO TABS: 20.0000 mg | ORAL_TABLET | Freq: Two times a day (BID) | ORAL | Status: DC

## 2016-04-20 MED ORDER — NAPROXEN 500 MG PO TABS: 500.0000 mg | ORAL_TABLET | Freq: Two times a day (BID) | ORAL | Status: DC

## 2016-04-20 NOTE — ED Provider Notes (Addendum)
CSN: [id number]     Arrival date & time 04/20/16  [id number] History   First MD Initiated Contact with Patient 04/20/16 0820     Chief Complaint  Patient presents with  . Hip Pain  . Knee Pain      HPI  Patient presents for evaluation of right hip pain and chronic right knee pain. 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 She states she limps "every day". She is to take anti-inflammatories but does not take anything for currently. For the last week she noticing this is an area of point tenderness over her right hip. No fall or injury or trauma. Does not have pain radiating from her back to her hip or beyond. She has normal strength and feeling in the leg. No bowel or bladder changes.  Past Medical History  Diagnosis Date  . Asthma   . Anemia   . Tinnitus   . Chondromalacia of hip   . Chondromalacia of right knee    History reviewed. No pertinent past surgical history. No family history on file. Social History  Substance Use Topics  . Smoking status: Never Smoker   . Smokeless tobacco: None  . Alcohol Use: No   OB History    No data available     Review of Systems  Constitutional: Negative for fever, chills, diaphoresis, appetite change and fatigue.  HENT: Negative for mouth sores, sore throat and trouble swallowing.   Eyes: Negative for visual disturbance.  Respiratory: Negative for cough, chest tightness, shortness of breath and wheezing.   Cardiovascular: Negative for chest pain.  Gastrointestinal: Negative for nausea, vomiting, abdominal pain, diarrhea and abdominal distention.  Endocrine: Negative for polydipsia, polyphagia and polyuria.  Genitourinary: Negative for dysuria, frequency and hematuria.  Musculoskeletal: Positive for arthralgias. Negative for gait problem.  Skin: Negative for color change, pallor and rash.  Neurological: Negative for dizziness, syncope, light-headedness and headaches.  Hematological:  Does not bruise/bleed easily.  Psychiatric/Behavioral: Negative for behavioral problems and confusion.      Allergies  Review of patient's allergies indicates no known allergies.  Home Medications   Prior to Admission medications   Medication Sig Start Date End Date Taking? Authorizing Provider  albuterol (PROVENTIL HFA;VENTOLIN HFA) 108 (90 Base) MCG/ACT inhaler Inhale 2 puffs into the lungs every 6 (six) hours as needed for wheezing or shortness of breath.    Historical Provider, MD  Fluticasone-Salmeterol (ADVAIR) 250-50 MCG/DOSE AEPB Inhale 1 puff into the lungs 2 (two) times daily.    Historical Provider, MD  Guaifenesin 1200 MG TB12 Take 1 tablet (1,200 mg total) by mouth 2 (two) times daily. 02/20/16   Christopher Lawyer, PA-C  montelukast (SINGULAIR) 10 MG tablet Take 10 mg by mouth at bedtime.    Historical Provider, MD  predniSONE (DELTASONE) 50 MG tablet Take 1 tablet (50 mg total) by mouth daily. 02/20/16   Dalia Heading, PA-C  promethazine-dextromethorphan (PROMETHAZINE-DM) 6.25-15 MG/5ML syrup Take 5 mLs by mouth 4 (four) times daily as needed for cough. 02/20/16   Christopher Lawyer, PA-C   BP 111/81 mmHg  Pulse 73  Temp(Src) 98.5 F (36.9 C) (Oral)  Resp 18  SpO2 99%  LMP 04/12/2016 Physical Exam  Constitutional: She is oriented to person, place, and time. She appears well-developed and well-nourished. No distress.  HENT:  Head: Normocephalic.  Eyes: Conjunctivae are normal. Pupils are equal, round, and reactive to light. No scleral icterus.  Neck: Normal range of motion.  Neck supple. No thyromegaly present.  Cardiovascular: Normal rate and regular rhythm.  Exam reveals no gallop and no friction rub.   No murmur heard. Pulmonary/Chest: Effort normal and breath sounds normal. No respiratory distress. She has no wheezes. She has no rales.  Abdominal: Soft. Bowel sounds are normal. She exhibits no distension. There is no tenderness. There is no rebound.    Musculoskeletal: Normal range of motion.       Back:       Legs: Normal neurologic exam. Does not describe radicular symptoms. No pain with internal/external rotation or piriformis stretching. Normal strength and sensation to the leg.  Neurological: She is alert and oriented to person, place, and time.  Skin: Skin is warm and dry. No rash noted.  Psychiatric: She has a normal mood and affect. Her behavior is normal.    ED Course  Procedures (including critical care time) Labs Review Labs Reviewed - No data to display  Imaging Review No results found. I have personally reviewed and evaluated these images and lab results as part of my medical decision-making.   EKG Interpretation None      MDM   Final diagnoses:  Trochanteric bursitis of right hip    Plan short course prednisone, to inflammatory, orthopedic follow-up regarding her right leg. She'll likely continue to have symptoms with her hip secondary to her antalgic gait until her knee is addressed.    Tanna Furry, MD 04/20/16 [id number]  Tanna Furry, MD 04/20/16 [phone]

## 2016-04-20 NOTE — Discharge Instructions (Signed)
You have an inflamed bursa in your hip. This is secondary to your chronic limp from your knee. Follow-up with orthopedics to discuss your knee pain and limp.   Bursitis Bursitis is when the fluid-filled sac (bursa) that covers and protects a joint is swollen (inflamed). Bursitis is most common near joints, especially the knees, elbows, hips, and shoulders.  HOME CARE  Take medicines only as told by your doctor.  If you were prescribed an antibiotic medicine, finish it all even if you start to feel better.  Rest the affected area as told by your doctor.  Keep the area raised up.  Avoid doing things that make the pain worse.  Apply ice to the injured area:  Place ice in a plastic bag.  Place a towel between your skin and the bag.  Leave the ice on for 20 minutes, 2-3 times a day.  Use splints, braces, pads, or walking aids as told by your doctor.  Keep all follow-up visits as told by your doctor. This is important. GET HELP IF:   You have more pain with home care.  You have a fever.  You have chills.   This information is not intended to replace advice given to you by your health care provider. Make sure you discuss any questions you have with your health care provider.   Document Released: 03/15/2010 Document Revised: 10/16/2014 Document Reviewed: 12/15/2013 Elsevier Interactive Patient Education [employer].

## 2016-04-20 NOTE — ED Notes (Signed)
Pt in from home with c/o R Hip/R Knee pain. Pt has hx of chondromylacia since birth, had been seeing ortho doc until 3 yrs ago. Pt reports she is unable to bear weight on R leg after long periods. Pain 8/10, right sided only.

## 2016-05-10 ENCOUNTER — Emergency Department (HOSPITAL_COMMUNITY)
Admission: EM | Admit: 2016-05-10 | Discharge: 2016-05-10 | Disposition: A | Discharge status: Home / Self Care | Payer: Medicaid Other | Attending: Emergency Medicine | Admitting: Emergency Medicine

## 2016-05-10 ENCOUNTER — Emergency Department (HOSPITAL_COMMUNITY): Payer: Medicaid Other

## 2016-05-10 ENCOUNTER — Encounter (HOSPITAL_COMMUNITY): Payer: Self-pay | Admitting: Neurology

## 2016-05-10 DIAGNOSIS — S8001XA Contusion of right knee, initial encounter: Secondary | ICD-10-CM | POA: Diagnosis present

## 2016-05-10 DIAGNOSIS — M25561 Pain in right knee: Secondary | ICD-10-CM

## 2016-05-10 DIAGNOSIS — X501XXA Overexertion from prolonged static or awkward postures, initial encounter: Secondary | ICD-10-CM | POA: Diagnosis not present

## 2016-05-10 DIAGNOSIS — Y939 Activity, unspecified: Secondary | ICD-10-CM | POA: Insufficient documentation

## 2016-05-10 DIAGNOSIS — J45909 Unspecified asthma, uncomplicated: Secondary | ICD-10-CM | POA: Insufficient documentation

## 2016-05-10 DIAGNOSIS — Y990 Civilian activity done for income or pay: Secondary | ICD-10-CM | POA: Insufficient documentation

## 2016-05-10 DIAGNOSIS — Y929 Unspecified place or not applicable: Secondary | ICD-10-CM | POA: Insufficient documentation

## 2016-05-10 MED ORDER — NAPROXEN 500 MG PO TABS: 500.0000 mg | ORAL_TABLET | Freq: Two times a day (BID) | ORAL | 0 refills | Status: DC

## 2016-05-10 MED ORDER — NAPROXEN 250 MG PO TABS
500.0000 mg | ORAL_TABLET | Freq: Once | ORAL | Status: AC
  Administered 2016-05-10: 500 mg via ORAL
  Filled 2016-05-10: qty 2

## 2016-05-10 NOTE — ED Provider Notes (Signed)
[facility] DEPT Provider Note  By signing my name below, I, Mesha Guinyard, attest that this documentation has been prepared under the direction and in the presence of Treatment Team:  Attending Provider: Blanchie Dessert, MD Physician Assistant: Frederica Kuster, PA-C.  Electronically Signed: Verlee Monte, Medical Scribe. 05/10/16. 12:33 PM.  CSN: [id number] Arrival date & time: 05/10/16  1157  First Provider Contact:  None     History   Chief Complaint Chief Complaint  Patient presents with  . Knee Pain    HPI Comments: Nancy Dickerson is a 35 y.o. female who presents to the Emergency Department complaining of excruciating right knee pain onset 2 weeks ago. Pain radiates to her shin when she bends it. Pt has to stand at her new job for her 11 hour shifts, and her knee pain escalated yesterday to a 9/10 that prevents her from bending it without pain. She states that she is having associated symptoms of bruises on her knee, knee swelling, nausea from the pain. She states that she has tried Aleve and ibuprofen 300 mg with no relief for her symptoms. Pt denies previous injury to her knee. Pt hasn't not seen any other doctor in the past 2 weeks, and does not have a PCP. Pt is not a smoker. Pt denies recent long trips, surgeries, and known CA. She denies chest pain, trouble breathing, abdominal pain, vomiting, urinary irregularities, fever, chills, HA and any other symptoms.   The history is provided by the patient. No language interpreter was used.  Knee Pain      Past Medical History:  Diagnosis Date  . Anemia   . Asthma   . Chondromalacia of hip   . Chondromalacia of right knee   . Tinnitus     There are no active problems to display for this patient.   History reviewed. No pertinent surgical history.  OB History    No data available       Home Medications    Prior to Admission medications   Medication Sig Start Date End Date Taking? Authorizing Provider    albuterol (PROVENTIL HFA;VENTOLIN HFA) 108 (90 Base) MCG/ACT inhaler Inhale 2 puffs into the lungs every 6 (six) hours as needed for wheezing or shortness of breath.    Historical Provider, MD  naproxen (NAPROSYN) 500 MG tablet Take 1 tablet (500 mg total) by mouth 2 (two) times daily. 05/10/16   Frederica Kuster, PA-C  predniSONE (DELTASONE) 20 MG tablet Take 1 tablet (20 mg total) by mouth 2 (two) times daily with a meal. 04/20/16   Tanna Furry, MD    Family History No family history on file.  Social History Social History  Substance Use Topics  . Smoking status: Never Smoker  . Smokeless tobacco: Never Used  . Alcohol use No     Allergies   Review of patient's allergies indicates no known allergies.   Review of Systems Review of Systems  Constitutional: Negative for chills and fever.  HENT: Negative for facial swelling and sore throat.   Respiratory: Negative for shortness of breath.   Cardiovascular: Negative for chest pain.  Gastrointestinal: Negative for abdominal pain, nausea and vomiting.  Genitourinary: Negative for dysuria, frequency and urgency.  Musculoskeletal: Positive for arthralgias and joint swelling. Negative for back pain.  Skin: Negative for rash and wound.  Neurological: Negative for headaches.  Psychiatric/Behavioral: The patient is not nervous/anxious.     Physical Exam Updated Vital Signs BP 134/80 (BP Location: Left Arm)  Pulse 87   Temp 98.1 F (36.7 C) (Oral)   Resp 16   LMP 05/10/2016   SpO2 100%   Physical Exam  Constitutional: She appears well-developed and well-nourished. No distress.  HENT:  Head: Normocephalic and atraumatic.  Eyes: Conjunctivae are normal. Pupils are equal, round, and reactive to light. Right eye exhibits no discharge. Left eye exhibits no discharge. No scleral icterus.  Neck: Normal range of motion. Neck supple. No thyromegaly present.  Cardiovascular: Normal rate, regular rhythm and normal heart sounds.  Exam  reveals no gallop and no friction rub.   No murmur heard. Pulmonary/Chest: Effort normal and breath sounds normal. No stridor. No respiratory distress. She has no wheezes. She has no rales.  Abdominal: Soft. Bowel sounds are normal. She exhibits no distension. There is no tenderness. There is no rebound and no guarding.  Musculoskeletal: She exhibits tenderness. She exhibits no edema.  Medial and lateral distal quadriceps tenderness Medial tenderness with McMurray's  Negative anterior and posterior drawer test No calf tenderness bilaterally 5/5 lower extremity strength with knee flexion bilaterally Decrease strength in right knee due to pain with knee extension Knee extension limited by pain No warmth or erythema to left knee Two 2 cm diameter ecchymoses to superior knee Normal sensation to lower extremities; cap refill <2secs  Lymphadenopathy:    She has no cervical adenopathy.  Neurological: She is alert. Coordination normal.  Skin: Skin is warm and dry. No rash noted. She is not diaphoretic. No pallor.  Psychiatric: She has a normal mood and affect.  Nursing note and vitals reviewed.    ED Treatments / Results  Labs (all labs ordered are listed, but only abnormal results are displayed) Labs Reviewed - No data to display  DIAGNOSTIC STUDIES: Oxygen Saturation is 100% on RA, nl by my interpretation.    COORDINATION OF CARE: 2:32 PM Discussed treatment plan with pt at bedside and pt agreed to plan.   EKG  EKG Interpretation None       Radiology Dg Knee Complete 4 Views Right  Result Date: 05/10/2016 CLINICAL DATA:  Right knee pain beginning 2 weeks ago with swelling. EXAM: RIGHT KNEE - COMPLETE 4+ VIEW COMPARISON:  None. FINDINGS: No evidence of fracture, dislocation, or joint effusion. No evidence of arthropathy or other focal bone abnormality. Soft tissues are unremarkable. IMPRESSION: Negative. Electronically Signed   By: Marin Olp M.D.   On: 05/10/2016 13:12     Procedures Procedures (including critical care time)  Medications Ordered in ED Medications  naproxen (NAPROSYN) tablet 500 mg (500 mg Oral Given 05/10/16 1304)     Initial Impression / Assessment and Plan / ED Course  I have reviewed the triage vital signs and the nursing notes.  Pertinent labs & imaging results that were available during my care of the patient were reviewed by me and considered in my medical decision making (see chart for details).  Clinical Course    Naprosyn given in ED.  Final Clinical Impressions(s) / ED Diagnoses   Final diagnoses:  Right knee pain   Patient symptoms most consistent with patellofemoral syndrome. X-ray of right knee negative. PERC negative and patient without calf tenderness. No warmth or erythema to joint. Patient afebrile. Patient given Naprosyn and knee sleeve in ED. Supportive treatment discussed. Discharged with Naprosyn 500mg  for 1 week. Orthopedic follow-up advised. Return precautions discussed. I discussed patient case with Dr. Maryan Rued who is in agreement with plan. Patient vitals stable throughout ED course discharged in satisfactory condition.  New Prescriptions New Prescriptions   NAPROXEN (NAPROSYN) 500 MG TABLET    Take 1 tablet (500 mg total) by mouth 2 (two) times daily.    I personally performed the services described in this documentation, which was scribed in my presence. The recorded information has been reviewed and is accurate.    Frederica Kuster, PA-C 05/10/16 1436    Blanchie Dessert, MD 05/11/16 1030

## 2016-05-10 NOTE — Discharge Instructions (Signed)
Medications: Naprosyn  Treatment: Take Naprosyn twice daily as prescribed for your pain and inflammation. Wear your knee sleeve or support at all times except when bathing, especially when walking or standing. Keep your knee elevated when you're not walking.  Follow-up: Please follow-up with Dr. Alvan Dame, an orthopedic doctor, for further evaluation and treatment of your knee pain. Please return to the emergency department if you develop any new or worsening symptoms.

## 2016-05-10 NOTE — ED Triage Notes (Addendum)
Pt reports right knee pain x 2 weeks ago, denies injury but reports it is swollen. 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 Pt has on tight jeans, unable to visualize bruising but took a picture of it that showed 2 small bruises. Pt ambulatory to triage.

## 2017-01-03 DIAGNOSIS — G43909 Migraine, unspecified, not intractable, without status migrainosus: Secondary | ICD-10-CM | POA: Diagnosis not present

## 2017-01-03 DIAGNOSIS — Z79899 Other long term (current) drug therapy: Secondary | ICD-10-CM | POA: Diagnosis not present

## 2017-01-03 DIAGNOSIS — G4730 Sleep apnea, unspecified: Secondary | ICD-10-CM | POA: Diagnosis not present

## 2017-01-03 DIAGNOSIS — E669 Obesity, unspecified: Secondary | ICD-10-CM | POA: Diagnosis not present

## 2017-01-03 DIAGNOSIS — J449 Chronic obstructive pulmonary disease, unspecified: Secondary | ICD-10-CM | POA: Diagnosis not present

## 2017-01-03 DIAGNOSIS — R079 Chest pain, unspecified: Secondary | ICD-10-CM | POA: Diagnosis not present

## 2017-01-03 DIAGNOSIS — J45909 Unspecified asthma, uncomplicated: Secondary | ICD-10-CM | POA: Diagnosis not present

## 2017-01-04 DIAGNOSIS — J449 Chronic obstructive pulmonary disease, unspecified: Secondary | ICD-10-CM | POA: Diagnosis not present

## 2017-01-07 ENCOUNTER — Encounter (HOSPITAL_COMMUNITY): Payer: Self-pay | Admitting: Emergency Medicine

## 2017-01-07 ENCOUNTER — Emergency Department (HOSPITAL_COMMUNITY)
Admission: EM | Admit: 2017-01-07 | Discharge: 2017-01-07 | Disposition: A | Discharge status: Home / Self Care | Payer: Medicaid Other | Attending: Emergency Medicine | Admitting: Emergency Medicine

## 2017-01-07 ENCOUNTER — Emergency Department (HOSPITAL_COMMUNITY): Payer: Medicaid Other

## 2017-01-07 DIAGNOSIS — R072 Precordial pain: Secondary | ICD-10-CM

## 2017-01-07 DIAGNOSIS — R079 Chest pain, unspecified: Secondary | ICD-10-CM | POA: Diagnosis not present

## 2017-01-07 DIAGNOSIS — D500 Iron deficiency anemia secondary to blood loss (chronic): Secondary | ICD-10-CM | POA: Diagnosis not present

## 2017-01-07 DIAGNOSIS — Z79899 Other long term (current) drug therapy: Secondary | ICD-10-CM | POA: Insufficient documentation

## 2017-01-07 DIAGNOSIS — J449 Chronic obstructive pulmonary disease, unspecified: Secondary | ICD-10-CM | POA: Diagnosis not present

## 2017-01-07 HISTORY — DX: Chronic obstructive pulmonary disease, unspecified: J44.9

## 2017-01-07 HISTORY — DX: Cardiomegaly: I51.7

## 2017-01-07 LAB — CBC
HCT: 27.6 % — ABNORMAL LOW (ref 36.0–46.0)
Hemoglobin: 8.7 g/dL — ABNORMAL LOW (ref 12.0–15.0)
MCH: 24.6 pg — ABNORMAL LOW (ref 26.0–34.0)
MCHC: 31.5 g/dL (ref 30.0–36.0)
MCV: 78 fL (ref 78.0–100.0)
Platelets: 392 10*3/uL (ref 150–400)
RBC: 3.54 MIL/uL — ABNORMAL LOW (ref 3.87–5.11)
RDW: 16.9 % — ABNORMAL HIGH (ref 11.5–15.5)
WBC: 9.2 10*3/uL (ref 4.0–10.5)

## 2017-01-07 LAB — BASIC METABOLIC PANEL
Anion gap: 5 (ref 5–15)
BUN: 8 mg/dL (ref 6–20)
CO2: 28 mmol/L (ref 22–32)
Calcium: 9.1 mg/dL (ref 8.9–10.3)
Chloride: 104 mmol/L (ref 101–111)
Creatinine, Ser: 0.71 mg/dL (ref 0.44–1.00)
GFR calc Af Amer: >60 mL/min (ref >60)
GFR calc non Af Amer: >60 mL/min (ref >60)
Glucose, Bld: 94 mg/dL (ref 65–99)
Potassium: 3.2 mmol/L — ABNORMAL LOW (ref 3.5–5.1)
Sodium: 137 mmol/L (ref 135–145)

## 2017-01-07 LAB — I-STAT TROPONIN, ED: Troponin i, poc: 0 ng/mL (ref 0.00–0.08)

## 2017-01-07 NOTE — Discharge Instructions (Signed)

## 2017-01-07 NOTE — ED Provider Notes (Signed)
[name] DEPT Provider Note   CSN: [id number] Arrival date & time: 01/07/17  2115   By signing my name below, I, Eunice Blase, attest that this documentation has been prepared under the direction and in the presence of Ripley Fraise, MD. Electronically signed, Eunice Blase, ED Scribe. 01/07/17. 11:59 PM.   History   Chief Complaint Chief Complaint  Patient presents with  . Chest Pain   The history is provided by the patient and medical records. No language interpreter was used.  Chest Pain   This is a recurrent problem. The current episode started 12 to 24 hours ago. The problem occurs daily. The problem has not changed since onset.The pain is associated with an emotional upset. The pain is present in the substernal region. The pain is at a severity of 6/10. The pain is moderate. The quality of the pain is described as pressure-like. The pain does not radiate. Associated symptoms include cough. Pertinent negatives include no abdominal pain, no diaphoresis, no dizziness, no fever, no vomiting and no weakness. She has tried rest for the symptoms. The treatment provided mild relief.  Her past medical history is significant for COPD.  Her family medical history is significant for heart disease.    HPI Comments: Nancy Dickerson is a 36 y.o. female with Hx of COPD, anemia and an enlarged heart who presents to the Emergency Department complaining of intermittent central chest pain since this morning. She adds this has been a recurrent issue. Pt notes associated cough, decreased sleep, "jerking in her sleep", hot sensation, a long heavy period that she states ended last week and increased stress. Pt states she has 6 children at home and her boyfriend's mother was told that she would die soon just prior to the pt's symptoms worsened today and she decided to seek treatment at [facility] ED. Pt reportedly seen at [facility] ED today where she had an EKG performed and she was offered a GI  cocktail, but she states she left AMA d/t family obligations and because she lives closer to [facility] ED. She states she takes multiple COPD medications regularly. Pt notes FMHx of CHF and she further notes she has had low blood counts chronically. Pt denies vomit, SOB, abdominal pain, leg swelling, syncope, weakness, dizziness, diaphoresis, birth control use, and Hx of diabetes, high blood pressure, blood clot, heart attack or tobacco use.  11:32 PM temperature orally is 98.8  Past Medical History:  Diagnosis Date  . Anemia   . Asthma   . Cardiomegaly   . Chondromalacia of hip   . Chondromalacia of right knee   . COPD (chronic obstructive pulmonary disease) ([place])   . Tinnitus     There are no active problems to display for this patient.   History reviewed. No pertinent surgical history.  OB History    No data available       Home Medications    Prior to Admission medications   Medication Sig Start Date End Date Taking? Authorizing Provider  albuterol (PROVENTIL HFA;VENTOLIN HFA) 108 (90 Base) MCG/ACT inhaler Inhale 2 puffs into the lungs every 6 (six) hours as needed for wheezing or shortness of breath.    Historical Provider, MD  naproxen (NAPROSYN) 500 MG tablet Take 1 tablet (500 mg total) by mouth 2 (two) times daily. 05/10/16   Frederica Kuster, PA-C  predniSONE (DELTASONE) 20 MG tablet Take 1 tablet (20 mg total) by mouth 2 (two) times daily with a meal. 04/20/16   Tanna Furry,  MD    Family History No family history on file.  Social History Social History  Substance Use Topics  . Smoking status: Never Smoker  . Smokeless tobacco: Never Used  . Alcohol use No     Allergies   Patient has no known allergies.   Review of Systems Review of Systems  Constitutional: Positive for chills. Negative for diaphoresis and fever.  Respiratory: Positive for cough.   Cardiovascular: Positive for chest pain. Negative for leg swelling.  Gastrointestinal: Negative for abdominal pain  and vomiting.  Neurological: Negative for dizziness, syncope and weakness.  Psychiatric/Behavioral: The patient is nervous/anxious.   All other systems reviewed and are negative.   Physical Exam Updated Vital Signs BP (!) 142/88 (BP Location: Left Arm)   Pulse 81   Resp 20   Ht 5\' 4"  (1.626 m)   Wt 230 lb (104.3 kg)   LMP 12/24/2016   SpO2 100%   BMI 39.48 kg/m   Physical Exam CONSTITUTIONAL: Well developed/well nourished HEAD: Normocephalic/atraumatic EYES: EOMI/PERRL ENMT: Mucous membranes moist NECK: supple no meningeal signs SPINE/BACK:entire spine nontender CV: S1/S2 noted, no murmurs/rubs/gallops noted LUNGS: Lungs are clear to auscultation bilaterally, no apparent distress ABDOMEN: soft, nontender, no rebound or guarding, bowel sounds noted throughout abdomen GU:no cva tenderness NEURO: Pt is awake/alert/appropriate, moves all extremitiesx4.  No facial droop.   EXTREMITIES: pulses normal/equal, full ROM, no significant calf tenderness or edema SKIN: warm, color normal PSYCH: no abnormalities of mood noted, alert and oriented to situation   ED Treatments / Results  DIAGNOSTIC STUDIES: Oxygen Saturation is 100% on RA, normal by my interpretation.    COORDINATION OF CARE: 11:31 PM Discussed treatment plan with pt at bedside and pt agreed to plan. Pt prepared for discharge, advised of F/U instructions and given return precautions.  Labs (all labs ordered are listed, but only abnormal results are displayed) Labs Reviewed  BASIC METABOLIC PANEL - Abnormal; Notable for the following:       Result Value   Potassium 3.2 (*)    All other components within normal limits  CBC - Abnormal; Notable for the following:    RBC 3.54 (*)    Hemoglobin 8.7 (*)    HCT 27.6 (*)    MCH 24.6 (*)    RDW 16.9 (*)    All other components within normal limits  I-STAT TROPOININ, ED    EKG  EKG Interpretation  Date/Time:  Sunday January 07 2017 22:37:43 EDT Ventricular Rate:   74 PR Interval:    QRS Duration: 104 QT Interval:  375 QTC Calculation: 416 R Axis:   33 Text Interpretation:  Sinus rhythm Nonspecific T abnormalities, anterior leads No previous ECGs available Confirmed by Christy Gentles  MD, Tydus Sanmiguel ([id number]) on 01/07/2017 11:26:12 PM       Radiology Dg Chest 2 View  Result Date: 01/07/2017 CLINICAL DATA:  Central chest pressure EXAM: CHEST  2 VIEW COMPARISON:  02/20/2016 FINDINGS: The heart size and mediastinal contours are within normal limits. Both lungs are clear. Old left sixth rib fracture with healing. IMPRESSION: No active cardiopulmonary disease. Chronic healed left posterior sixth rib fracture. Electronically Signed   By: Ashley Royalty M.D.   On: 01/07/2017 23:00    Procedures Procedures (including critical care time)  Medications Ordered in ED Medications - No data to display   Initial Impression / Assessment and Plan / ED Course  I have reviewed the triage vital signs and the nursing notes.  Pertinent labs & imaging results that  were available during my care of the patient were reviewed by me and considered in my medical decision making (see chart for details).     Pt well appearing HEART score 3 She appears PERC negative Temp here is 98 I doubt PE/ACS/dissection at this time She did have some elevated BP.  Advised to have recheck with PCP She would like to be discharged home EKG is abnormal though unclear how acute this is (ekg from earlier today at [facility] showed similar changes) but no EKG prior to today  Will d/c home We discussed return precautions  Final Clinical Impressions(s) / ED Diagnoses   Final diagnoses:  Precordial pain  Iron deficiency anemia due to chronic blood loss    New Prescriptions Discharge Medication List as of 01/07/2017 11:38 PM     I personally performed the services described in this documentation, which was scribed in my presence. The recorded information has been reviewed and is accurate.         Ripley Fraise, MD 01/08/17 [phone]

## 2017-01-07 NOTE — ED Triage Notes (Addendum)
Pt from home with complaints of central chest pressure that she states is common for her COPD. Pt states she left [facility] AMA because she wanted to be closer to home. Pt reports she has a hx of cardiomegaly. Pt denies other symptoms aside from right forearm discomfort that began while she was waiting in the waiting room. Pt has adequate cap refill and normal heart and lung sounds. Pt states she is unable to quantify her pain "because she has had 6 children". Pt stated that at [facility] they were going to give her a GI cocktail, however she never received it because she left AMA

## 2017-01-07 NOTE — ED Notes (Signed)
Pt ambulatory and independent at discharge.  Verbalized understanding of discharge instructions 

## 2017-01-23 ENCOUNTER — Emergency Department (HOSPITAL_COMMUNITY): Payer: Medicaid Other

## 2017-01-23 ENCOUNTER — Encounter (HOSPITAL_COMMUNITY): Payer: Self-pay | Admitting: Emergency Medicine

## 2017-01-23 ENCOUNTER — Emergency Department (HOSPITAL_COMMUNITY)
Admission: EM | Admit: 2017-01-23 | Discharge: 2017-01-23 | Disposition: A | Discharge status: Home / Self Care | Payer: Medicaid Other | Attending: Emergency Medicine | Admitting: Emergency Medicine

## 2017-01-23 ENCOUNTER — Other Ambulatory Visit: Payer: Self-pay

## 2017-01-23 DIAGNOSIS — J449 Chronic obstructive pulmonary disease, unspecified: Secondary | ICD-10-CM | POA: Insufficient documentation

## 2017-01-23 DIAGNOSIS — R0781 Pleurodynia: Secondary | ICD-10-CM | POA: Diagnosis not present

## 2017-01-23 DIAGNOSIS — R0602 Shortness of breath: Secondary | ICD-10-CM

## 2017-01-23 DIAGNOSIS — R071 Chest pain on breathing: Secondary | ICD-10-CM | POA: Diagnosis present

## 2017-01-23 DIAGNOSIS — R0789 Other chest pain: Secondary | ICD-10-CM | POA: Diagnosis not present

## 2017-01-23 LAB — D-DIMER, QUANTITATIVE: D-Dimer, Quant: 0.5 ug/mL-FEU (ref 0.00–0.50)

## 2017-01-23 LAB — CBC
HCT: 27.4 % — ABNORMAL LOW (ref 36.0–46.0)
Hemoglobin: 8.2 g/dL — ABNORMAL LOW (ref 12.0–15.0)
MCH: 23 pg — ABNORMAL LOW (ref 26.0–34.0)
MCHC: 29.9 g/dL — ABNORMAL LOW (ref 30.0–36.0)
MCV: 76.8 fL — ABNORMAL LOW (ref 78.0–100.0)
Platelets: 352 10*3/uL (ref 150–400)
RBC: 3.57 MIL/uL — ABNORMAL LOW (ref 3.87–5.11)
RDW: 16.9 % — ABNORMAL HIGH (ref 11.5–15.5)
WBC: 9.3 10*3/uL (ref 4.0–10.5)

## 2017-01-23 LAB — I-STAT TROPONIN, ED: Troponin i, poc: 0 ng/mL (ref 0.00–0.08)

## 2017-01-23 LAB — BASIC METABOLIC PANEL
Anion gap: 7 (ref 5–15)
BUN: 8 mg/dL (ref 6–20)
CO2: 26 mmol/L (ref 22–32)
Calcium: 9.1 mg/dL (ref 8.9–10.3)
Chloride: 105 mmol/L (ref 101–111)
Creatinine, Ser: 0.73 mg/dL (ref 0.44–1.00)
GFR calc Af Amer: >60 mL/min (ref >60)
GFR calc non Af Amer: >60 mL/min (ref >60)
Glucose, Bld: 126 mg/dL — ABNORMAL HIGH (ref 65–99)
Potassium: 3.4 mmol/L — ABNORMAL LOW (ref 3.5–5.1)
Sodium: 138 mmol/L (ref 135–145)

## 2017-01-23 MED ORDER — PREDNISONE 20 MG PO TABS: 40.0000 mg | ORAL_TABLET | Freq: Every day | ORAL | 0 refills | Status: AC

## 2017-01-23 MED ORDER — ALBUTEROL SULFATE HFA 108 (90 BASE) MCG/ACT IN AERS: 1.0000 | INHALATION_SPRAY | Freq: Four times a day (QID) | RESPIRATORY_TRACT | 0 refills | Status: DC | PRN

## 2017-01-23 NOTE — ED Notes (Signed)
Pt transported to xray 

## 2017-01-23 NOTE — ED Provider Notes (Signed)
Emergency Department Provider Note  By signing my name below, I, Collene Leyden, attest that this documentation has been prepared under the direction and in the presence of Margette Fast, MD. Electronically Signed: Collene Leyden, Scribe. 01/23/17. 7:25 PM.  I have reviewed the triage vital signs and the nursing notes.   HISTORY  Chief Complaint Chest Pain  HPI Comments: Nancy Dickerson is a 36 y.o. female with a history of COPD, anemia, asthma, and cardiomegaly, who presents to the Emergency Department complaining of sudden-onset, intermittent central chest pain that began earlier this morning. Patient states her pain is participated by deep inspiration. Patient does have a history of COPD. Patient states she has never had this problem in the past. Patient states there is a nurse at her work, who told her that her breath sounds are diminished. Patient was seen here 16 days ago for central chest pain, in which she states her  Pain today is not similar. Patient reports using her albuterol inhaler with no relief. Patient denies any nausea, vomiting, abdominal pain, shortness of breath, or any other symptoms.    Past Medical History:  Diagnosis Date  . Anemia   . Asthma   . Cardiomegaly   . Chondromalacia of hip   . Chondromalacia of right knee   . COPD (chronic obstructive pulmonary disease) ([place])   . Tinnitus     There are no active problems to display for this patient.   History reviewed. No pertinent surgical history.  Current Outpatient Rx  . Order #: [id number] Class: Print  . Order #: [id number] Class: Print  . Order #: [id number] Class: Print    Allergies Patient has no known allergies.  History reviewed. No pertinent family history.  Social History Social History  Substance Use Topics  . Smoking status: Never Smoker  . Smokeless tobacco: Never Used  . Alcohol use No    Review of Systems Constitutional: No fever/chills Eyes: No visual changes. ENT: No sore  throat. Cardiovascular: + chest pain. Respiratory: Denies shortness of breath. Gastrointestinal: No abdominal pain.  No nausea, no vomiting.  No diarrhea.  No constipation. Genitourinary: Negative for dysuria. Musculoskeletal: Negative for back pain. Skin: Negative for rash. Neurological: Negative for headaches, focal weakness or numbness. 10-point ROS otherwise negative.  ____________________________________________   PHYSICAL EXAM:  VITAL SIGNS: ED Triage Vitals [01/23/17 1843]  Enc Vitals Group     BP 137/87     Pulse Rate 91     Resp 18     Temp 98 F (36.7 C)     Temp Source Oral     SpO2 100 %     Pain Score 8   Constitutional: Alert and oriented. Well appearing and in no acute distress. Eyes: Conjunctivae are normal. Head: Atraumatic. Nose: No congestion/rhinnorhea. Mouth/Throat: Mucous membranes are moist.  Oropharynx non-erythematous. Neck: No stridor.   Cardiovascular: Normal rate, regular rhythm. Good peripheral circulation. Grossly normal heart sounds.   Respiratory: Normal respiratory effort.  No retractions. Lungs CTAB. Gastrointestinal: Soft and nontender. No distention.  Musculoskeletal: No lower extremity tenderness nor edema. No gross deformities of extremities. Neurologic:  Normal speech and language. No gross focal neurologic deficits are appreciated.  Skin:  Skin is warm, dry and intact. No rash noted.  ____________________________________________   LABS (all labs ordered are listed, but only abnormal results are displayed)  Labs Reviewed  BASIC METABOLIC PANEL - Abnormal; Notable for the following:       Result Value   Potassium 3.4 (*)  Glucose, Bld 126 (*)    All other components within normal limits  CBC - Abnormal; Notable for the following:    RBC 3.57 (*)    Hemoglobin 8.2 (*)    HCT 27.4 (*)    MCV 76.8 (*)    MCH 23.0 (*)    MCHC 29.9 (*)    RDW 16.9 (*)    All other components within normal limits  D-DIMER, QUANTITATIVE (NOT  AT [facility])  I-STAT TROPOININ, ED   ____________________________________________  EKG   EKG Interpretation  Date/Time:  Tuesday January 23 2017 18:44:53 EDT Ventricular Rate:  93 PR Interval:  160 QRS Duration: 92 QT Interval:  366 QTC Calculation: 455 R Axis:   12 Text Interpretation:  Normal sinus rhythm Moderate voltage criteria for LVH, may be normal variant Borderline ECG No STEMI. Similar to prior.  Confirmed by Alverna Fawley MD, Nimo Verastegui [phone]) on 01/23/2017 7:20:23 PM Also confirmed by Kirstine Jacquin MD, Mayson Mcneish [phone]), editor Drema Pry [phone])  on 01/24/2017 7:04:43 AM       ____________________________________________  RADIOLOGY  CXR:  EXAM: CHEST 2 VIEW  COMPARISON: 01/07/2017 and 02/20/2016  FINDINGS: Lungs are hypoinflated without consolidation or effusion. Cardiomediastinal silhouette is within normal. Old left posterior sixth rib fracture.  IMPRESSION: No active cardiopulmonary disease.   Electronically Signed By: Marin Olp M.D. On: 01/23/2017 20:08 ____________________________________________   PROCEDURES  Procedure(s) performed:   Procedures  None ____________________________________________   INITIAL IMPRESSION / ASSESSMENT AND PLAN / ED COURSE  Pertinent labs & imaging results that were available during my care of the patient were reviewed by me and considered in my medical decision making (see chart for details).  Patient presents to the ED for evaluation of atypical, pleuritic chest pain. Low suspicion for PE or ACS. Patient is low risk by [place] for PE. D-dimer negative. Troponin negative x 2 with unremarkable EKG. Plan for PCP follow up. Return precautions discussed in detail.   Differential includes all life-threatening causes for chest pain. This includes but is not exclusive to acute coronary syndrome, aortic dissection, pulmonary embolism, cardiac tamponade, community-acquired pneumonia, pericarditis, musculoskeletal chest wall  pain, etc.  At this time, I do not feel there is any life-threatening condition present. I have reviewed and discussed all results (EKG, imaging, lab, urine as appropriate), exam findings with patient. I have reviewed nursing notes and appropriate previous records.  I feel the patient is safe to be discharged home without further emergent workup. Discussed usual and customary return precautions. Patient and family (if present) verbalize understanding and are comfortable with this plan.  Patient will follow-up with their primary care provider. If they do not have a primary care provider, information for follow-up has been provided to them. All questions have been answered.  ____________________________________________  FINAL CLINICAL IMPRESSION(S) / ED DIAGNOSES  Final diagnoses:  Pleuritic chest pain  Shortness of breath     MEDICATIONS GIVEN DURING THIS VISIT:  None  NEW OUTPATIENT MEDICATIONS STARTED DURING THIS VISIT:  None  I personally performed the services described in this documentation, which was scribed in my presence. The recorded information has been reviewed and is accurate.    Note:  This document was prepared using Dragon voice recognition software and may include unintentional dictation errors.  Nanda Quinton, MD Emergency Medicine    Margette Fast, MD 01/26/17 [phone]

## 2017-01-23 NOTE — ED Triage Notes (Signed)
Pt sts CP worse with inspiration and cough; pt sts hx of COPD

## 2017-01-23 NOTE — Discharge Instructions (Signed)

## 2017-01-23 NOTE — ED Notes (Signed)
Work note given to pt

## 2017-01-29 ENCOUNTER — Encounter (HOSPITAL_COMMUNITY): Payer: Self-pay | Admitting: Emergency Medicine

## 2017-04-10 ENCOUNTER — Encounter (HOSPITAL_COMMUNITY): Payer: Self-pay

## 2017-04-10 ENCOUNTER — Emergency Department (HOSPITAL_COMMUNITY)
Admission: EM | Admit: 2017-04-10 | Discharge: 2017-04-10 | Disposition: A | Discharge status: Home / Self Care | Payer: Medicaid Other | Attending: Emergency Medicine | Admitting: Emergency Medicine

## 2017-04-10 ENCOUNTER — Emergency Department (HOSPITAL_COMMUNITY): Payer: Medicaid Other

## 2017-04-10 DIAGNOSIS — J45909 Unspecified asthma, uncomplicated: Secondary | ICD-10-CM | POA: Diagnosis not present

## 2017-04-10 DIAGNOSIS — J449 Chronic obstructive pulmonary disease, unspecified: Secondary | ICD-10-CM

## 2017-04-10 DIAGNOSIS — R0602 Shortness of breath: Secondary | ICD-10-CM | POA: Diagnosis present

## 2017-04-10 NOTE — ED Provider Notes (Signed)
[facility] DEPT Provider Note   CSN: [id number] Arrival date & time: 04/10/17  9892     History   Chief Complaint Chief Complaint  Patient presents with  . Cough    HPI Nancy Dickerson is a 36 y.o. female.  HPI   36 year old female presents today with complaints of shortness of breath.  Patient notes a recent diagnosis of COPD.  Patient notes a history of asthma previously.  She notes that she has been followed by her primary care who is done pulmonary function testing, echo, and is currently adjusting medication as needed for COPD.  Patient notes over the last 3 months she has had shortness of breath, worse with ambulation to the point where they have given her a handicap sticker.  She notes no significant shortness of breath at rest, does note some shortness of breath while laying back.  She notes these are ongoing, but slightly worsening over the last several weeks.  Patient denies any fever, chest pain, unilateral swelling or edema, history of PE or DVT.  Patient denies any significant risk factors for PE.  She denies any significant cardiac history, recent echo 1 month ago showing some valvular abnormalities.  Patient notes she is using albuterol, Flovent as needed for acute symptoms.  She has not seen a pulmonologist up until this point.  She does note some very minor swelling to the bilateral lower extremities that has been present for several months.    Patient notes that she has worked in [place] previously, grew up around smokers, but denies any new pulmonary exposure.   Past Medical History:  Diagnosis Date  . Anemia   . Asthma   . Bursitis of hip   . Cardiomegaly   . Chondromalacia of hip   . Chondromalacia of right knee   . COPD (chronic obstructive pulmonary disease) ([place])   . GERD (gastroesophageal reflux disease)   . H/O transfusion of packed red blood cells   . Knee pain   . Tinnitus     There are no active problems to display for this patient.   History  reviewed. No pertinent surgical history.  OB History    No data available       Home Medications    Prior to Admission medications   Medication Sig Start Date End Date Taking? Authorizing Provider  albuterol (PROVENTIL HFA;VENTOLIN HFA) 108 (90 Base) MCG/ACT inhaler Inhale 1-2 puffs into the lungs every 6 (six) hours as needed for wheezing or shortness of breath. 01/23/17   Long, Wonda Olds, MD  bismuth subsalicylate (PEPTO BISMOL) 262 MG/15ML suspension Take 30 mLs by mouth every 6 (six) hours as needed for diarrhea or loose stools.    [provider]  clotrimazole (LOTRIMIN) 1 % cream Apply to affected area 2 times daily 06/30/14   Kirichenko, Lahoma Rocker, PA-C  diphenoxylate-atropine (LOMOTIL) 2.5-0.025 MG per tablet Take 1 tablet by mouth 4 (four) times daily as needed for diarrhea or loose stools. 06/30/14   Kirichenko, Lahoma Rocker, PA-C  loperamide (IMODIUM) 2 MG capsule Take 2 mg by mouth as needed for diarrhea or loose stools.    [provider]  naproxen (NAPROSYN) 500 MG tablet Take 1 tablet (500 mg total) by mouth 2 (two) times daily. 05/10/16   Frederica Kuster, PA-C    Family History History reviewed. No pertinent family history.  Social History Social History  Substance Use Topics  . Smoking status: Never Smoker  . Smokeless tobacco: Never Used  . Alcohol use  Yes     Comment: rare     Allergies   Patient has no known allergies.   Review of Systems Review of Systems  All other systems reviewed and are negative.    Physical Exam Updated Vital Signs BP 125/85 (BP Location: Right Arm)   Pulse 87   Temp 97.9 F (36.6 C) (Oral)   Resp 18   LMP 04/09/2017 (Exact Date)   SpO2 96%   Physical Exam  Constitutional: She is oriented to person, place, and time. She appears well-developed and well-nourished.  HENT:  Head: Normocephalic and atraumatic.  Eyes: Conjunctivae are normal. Pupils are equal, round, and reactive to light. Right eye exhibits no  discharge. Left eye exhibits no discharge. No scleral icterus.  Neck: Normal range of motion. No JVD present. No tracheal deviation present.  Cardiovascular: Normal rate, regular rhythm, normal heart sounds and intact distal pulses.  Exam reveals no gallop and no friction rub.   No murmur heard. Pulmonary/Chest: Effort normal and breath sounds normal. No stridor. No respiratory distress. She has no wheezes. She has no rales. She exhibits no tenderness.  Musculoskeletal:  Scant lower extremity edema   Neurological: She is alert and oriented to person, place, and time. Coordination normal.  Psychiatric: She has a normal mood and affect. Her behavior is normal. Judgment and thought content normal.  Nursing note and vitals reviewed.    ED Treatments / Results  Labs (all labs ordered are listed, but only abnormal results are displayed) Labs Reviewed - No data to display  EKG  EKG Interpretation None       Radiology Dg Chest 2 View  Result Date: 04/10/2017 CLINICAL DATA:  COPD, increasing cough and shortness of breath over the last week EXAM: CHEST  2 VIEW COMPARISON:  Chest x-ray of 01/23/2017 FINDINGS: No active infiltrate or effusion is seen. Mediastinal and hilar contours are unremarkable. The heart is within normal limits in size. No acute bony abnormality is seen. and old healed fracture of the left posterolateral sixth rib is again noted IMPRESSION: No active cardiopulmonary disease. Electronically Signed   By: Ivar Drape M.D.   On: 04/10/2017 10:35    Procedures Procedures (including critical care time)  Medications Ordered in ED Medications - No data to display   Initial Impression / Assessment and Plan / ED Course  I have reviewed the triage vital signs and the nursing notes.  Pertinent labs & imaging results that were available during my care of the patient were reviewed by me and considered in my medical decision making (see chart for details).     Final Clinical  Impressions(s) / ED Diagnoses   Final diagnoses:  Chronic obstructive pulmonary disease, unspecified COPD type ([place])    Labs: DG Chest  Imaging:   Consults:  Therapeutics:  Discharge Meds:   Assessment/Plan:  41 YOF presents today with complaints of shortness of breath.  This is an ongoing issue for her.  She has been diagnosed with COPD, she had recent echo showing no signs of heart failure.  I have very low suspicion for PE, patient is PERC negative, no concern for ACS, and no signs of infectious etiology today.  Patient will refer to pulmonology, encouraged to continue following with primary care return if any new or worsening signs or symptoms present.  She verbalized understanding and agreement to today's plan had no further questions or concerns the time discharge.   New Prescriptions Discharge Medication List as of 04/10/2017  1:15 PM  Okey Regal, PA-C 04/10/17 1321    Daleen Bo, MD 04/11/17 [phone]

## 2017-04-10 NOTE — Discharge Instructions (Signed)
Please read attached information. If you experience any new or worsening signs or symptoms please return to the emergency room for evaluation. Please follow-up with your primary care provider or specialist as discussed.  °

## 2017-04-10 NOTE — ED Notes (Signed)
Provider at bedside

## 2017-04-10 NOTE — ED Triage Notes (Signed)
Pt states she has COPD and has had increased cough and shortness of breath. No distress noted. Skin warm and dry.

## 2017-05-11 MED ORDER — FENTANYL CITRATE (PF) 100 MCG/2ML IJ SOLN
INTRAMUSCULAR | Status: AC
  Filled 2017-05-11: qty 2

## 2017-05-11 MED ORDER — ACETAMINOPHEN 120 MG RE SUPP
RECTAL | Status: AC
  Filled 2017-05-11: qty 1

## 2017-05-11 MED ORDER — MORPHINE SULFATE (PF) 4 MG/ML IV SOLN
INTRAVENOUS | Status: AC
  Filled 2017-05-11: qty 1

## 2017-07-25 ENCOUNTER — Encounter (HOSPITAL_COMMUNITY): Payer: Self-pay | Admitting: Emergency Medicine

## 2017-07-25 ENCOUNTER — Emergency Department (HOSPITAL_COMMUNITY): Payer: Medicaid Other

## 2017-07-25 ENCOUNTER — Emergency Department (HOSPITAL_COMMUNITY)
Admission: EM | Admit: 2017-07-25 | Discharge: 2017-07-25 | Disposition: A | Discharge status: Home / Self Care | Payer: Medicaid Other | Attending: Emergency Medicine | Admitting: Emergency Medicine

## 2017-07-25 DIAGNOSIS — R51 Headache: Secondary | ICD-10-CM | POA: Insufficient documentation

## 2017-07-25 DIAGNOSIS — J449 Chronic obstructive pulmonary disease, unspecified: Secondary | ICD-10-CM | POA: Insufficient documentation

## 2017-07-25 DIAGNOSIS — J45909 Unspecified asthma, uncomplicated: Secondary | ICD-10-CM | POA: Diagnosis not present

## 2017-07-25 DIAGNOSIS — R0789 Other chest pain: Secondary | ICD-10-CM | POA: Insufficient documentation

## 2017-07-25 DIAGNOSIS — D649 Anemia, unspecified: Secondary | ICD-10-CM

## 2017-07-25 DIAGNOSIS — M549 Dorsalgia, unspecified: Secondary | ICD-10-CM | POA: Diagnosis present

## 2017-07-25 DIAGNOSIS — R0602 Shortness of breath: Secondary | ICD-10-CM

## 2017-07-25 DIAGNOSIS — G44209 Tension-type headache, unspecified, not intractable: Secondary | ICD-10-CM

## 2017-07-25 DIAGNOSIS — G44201 Tension-type headache, unspecified, intractable: Secondary | ICD-10-CM | POA: Diagnosis not present

## 2017-07-25 LAB — CBC WITH DIFFERENTIAL/PLATELET
Basophils Absolute: 0 10*3/uL (ref 0.0–0.1)
Basophils Relative: 0 %
Eosinophils Absolute: 0.1 10*3/uL (ref 0.0–0.7)
Eosinophils Relative: 2 %
HCT: 26.3 % — ABNORMAL LOW (ref 36.0–46.0)
Hemoglobin: 7.7 g/dL — ABNORMAL LOW (ref 12.0–15.0)
Lymphocytes Relative: 32 %
Lymphs Abs: 2.3 10*3/uL (ref 0.7–4.0)
MCH: 20.9 pg — ABNORMAL LOW (ref 26.0–34.0)
MCHC: 29.3 g/dL — ABNORMAL LOW (ref 30.0–36.0)
MCV: 71.5 fL — ABNORMAL LOW (ref 78.0–100.0)
Monocytes Absolute: 0.4 10*3/uL (ref 0.1–1.0)
Monocytes Relative: 6 %
Neutro Abs: 4.3 10*3/uL (ref 1.7–7.7)
Neutrophils Relative %: 60 %
Platelets: 391 10*3/uL (ref 150–400)
RBC: 3.68 MIL/uL — ABNORMAL LOW (ref 3.87–5.11)
RDW: 17.8 % — ABNORMAL HIGH (ref 11.5–15.5)
WBC: 7.1 10*3/uL (ref 4.0–10.5)

## 2017-07-25 LAB — BASIC METABOLIC PANEL
Anion gap: 6 (ref 5–15)
BUN: 7 mg/dL (ref 6–20)
CO2: 26 mmol/L (ref 22–32)
Calcium: 8.5 mg/dL — ABNORMAL LOW (ref 8.9–10.3)
Chloride: 105 mmol/L (ref 101–111)
Creatinine, Ser: 0.74 mg/dL (ref 0.44–1.00)
GFR calc Af Amer: >60 mL/min (ref >60)
GFR calc non Af Amer: >60 mL/min (ref >60)
Glucose, Bld: 120 mg/dL — ABNORMAL HIGH (ref 65–99)
Potassium: 3.2 mmol/L — ABNORMAL LOW (ref 3.5–5.1)
Sodium: 137 mmol/L (ref 135–145)

## 2017-07-25 LAB — D-DIMER, QUANTITATIVE: D-Dimer, Quant: 0.37 ug/mL-FEU (ref 0.00–0.50)

## 2017-07-25 LAB — I-STAT BETA HCG BLOOD, ED (MC, WL, AP ONLY): I-stat hCG, quantitative: <5 m[IU]/mL (ref <5)

## 2017-07-25 LAB — I-STAT TROPONIN, ED: Troponin i, poc: 0 ng/mL (ref 0.00–0.08)

## 2017-07-25 MED ORDER — KETOROLAC TROMETHAMINE 30 MG/ML IJ SOLN
30.0000 mg | Freq: Once | INTRAMUSCULAR | Status: AC
  Administered 2017-07-25: 30 mg via INTRAVENOUS
  Filled 2017-07-25: qty 1

## 2017-07-25 MED ORDER — IBUPROFEN 800 MG PO TABS: 800.0000 mg | ORAL_TABLET | Freq: Three times a day (TID) | ORAL | 0 refills | Status: DC | PRN

## 2017-07-25 NOTE — ED Notes (Signed)
Pt state she understands instructions and improved after medications. Home stable with steady gait with husband.

## 2017-07-25 NOTE — ED Provider Notes (Signed)
Emergency Department Provider Note   I have reviewed the triage vital signs and the nursing notes.   HISTORY  Chief Complaint Back Pain   HPI Nancy Dickerson is a 36 y.o. female with PMH of COPD, GERD, and asthma presents to the emergency department for evaluation of right upper back pain with difficulty breathing. Patient states symptoms began last night at work. She attributed this to her job having cold air blowing in the fence. She has tried her home COPD inhalers with little to no relief in symptoms. She denies any associated fever, chills, productive cough. No leg swelling. She also endorses a mild headache radiating from the right, posterior neck to the right eye. No vision changes. No sudden onset maximal intensity headache symptoms. The headache is been intermittent over the past several weeks to months. No pleuritic or exertional chest pain.    Past Medical History:  Diagnosis Date  . Anemia   . Asthma   . Bursitis of hip   . Cardiomegaly   . Chondromalacia of hip   . Chondromalacia of right knee   . COPD (chronic obstructive pulmonary disease) ([place])   . GERD (gastroesophageal reflux disease)   . H/O transfusion of packed red blood cells   . Knee pain   . Tinnitus     There are no active problems to display for this patient.   History reviewed. No pertinent surgical history.  Current Outpatient Rx  . Order #: [id number] Class: Print  . Order #: [id number] Class: Historical Med  . Order #: [id number] Class: Historical Med  . Order #: [id number] Class: Print  . Order #: [id number] Class: Print  . Order #: [id number] Class: Historical Med  . Order #: [id number] Class: Historical Med  . Order #: [id number] Class: Historical Med  . Order #: [id number] Class: Print  . Order #: [id number] Class: Print    Allergies Patient has no known allergies.  No family history on file.  Social History Social History  Substance Use Topics  . Smoking status: Never Smoker  . Smokeless  tobacco: Never Used  . Alcohol use Yes     Comment: rare    Review of Systems   Constitutional: No fever/chills Eyes: No visual changes. ENT: No sore throat. Cardiovascular: Positive posterior chest pain. Respiratory: Positive shortness of breath. Gastrointestinal: No abdominal pain.  No nausea, no vomiting.  No diarrhea.  No constipation. Genitourinary: Negative for dysuria. Musculoskeletal: Negative for back pain. Skin: Negative for rash. Neurological: Negative for focal weakness or numbness. Positive HA.   10-point ROS otherwise negative.  ____________________________________________   PHYSICAL EXAM:  VITAL SIGNS: ED Triage Vitals  Enc Vitals Group     BP 07/25/17 1035 (!) 148/94     Pulse Rate 07/25/17 1035 94     Resp 07/25/17 1035 17     Temp 07/25/17 1035 98.7 F (37.1 C)     Temp Source 07/25/17 1035 Oral     SpO2 07/25/17 1035 100 %     Pain Score 07/25/17 1016 5   Constitutional: Alert and oriented. Well appearing and in no acute distress. Eyes: Conjunctivae are normal. PERRL. EOMI. Normal fundoscopic exam  Head: Atraumatic. Nose: No congestion/rhinnorhea. Mouth/Throat: Mucous membranes are moist.  Oropharynx non-erythematous. Neck: No stridor.  No meningeal signs. No cervical spine tenderness to palpation. Cardiovascular: Normal rate, regular rhythm. Good peripheral circulation. Grossly normal heart sounds.   Respiratory: Normal respiratory effort.  No retractions. Lungs CTAB. Gastrointestinal: Soft and nontender. No distention.  Musculoskeletal: No lower extremity tenderness nor edema.  No gross deformities of extremities. Neurologic:  Normal speech and language. No gross focal neurologic deficits are appreciated.  Skin:  Skin is warm, dry and intact. No rash noted.  ____________________________________________   LABS (all labs ordered are listed, but only abnormal results are displayed)  Labs Reviewed  BASIC METABOLIC PANEL - Abnormal; Notable for  the following:       Result Value   Potassium 3.2 (*)    Glucose, Bld 120 (*)    Calcium 8.5 (*)    All other components within normal limits  CBC WITH DIFFERENTIAL/PLATELET - Abnormal; Notable for the following:    RBC 3.68 (*)    Hemoglobin 7.7 (*)    HCT 26.3 (*)    MCV 71.5 (*)    MCH 20.9 (*)    MCHC 29.3 (*)    RDW 17.8 (*)    All other components within normal limits  D-DIMER, QUANTITATIVE (NOT AT [facility])  I-STAT TROPONIN, ED  I-STAT BETA HCG BLOOD, ED ([facility], [facility], [facility] ONLY)   ____________________________________________  EKG   EKG Interpretation  Date/Time:  Wednesday July 25 2017 10:13:26 EDT Ventricular Rate:  82 PR Interval:  166 QRS Duration: 90 QT Interval:  398 QTC Calculation: 464 R Axis:   12 Text Interpretation:  Normal sinus rhythm Minimal voltage criteria for LVH, may be normal variant T wave abnormality, consider anterior ischemia Prolonged QT Abnormal ECG No STEMI.  Confirmed by Nanda Quinton [phone]) on 07/25/2017 11:44:26 AM       ____________________________________________  RADIOLOGY  Dg Chest 2 View  Result Date: 07/25/2017 CLINICAL DATA:  Shortness of breath and chest pain. Right scapular pain. EXAM: CHEST  2 VIEW COMPARISON:  04/10/2017. FINDINGS: Trachea is midline. Heart size stable. Lungs are somewhat low in volume but clear. Old left rib fractures. No pleural fluid. IMPRESSION: No acute findings. Electronically Signed   By: Lorin Picket M.D.   On: 07/25/2017 11:08    ____________________________________________   PROCEDURES  Procedure(s) performed:   Procedures  None ____________________________________________   INITIAL IMPRESSION / ASSESSMENT AND PLAN / ED COURSE  Pertinent labs & imaging results that were available during my care of the patient were reviewed by me and considered in my medical decision making (see chart for details).  Patient resents to the emergency department for evaluation of pain in the right  posterior chest with some associated dyspnea. Patient has some tenderness to palpation in the paraspinal musculature in the back. Very low suspicion for DVE/PE or ACS in this case. Plan for labs, CXR, and reassess after Toradol.   Labs and imaging unremarkable. Plan for supportive care at home and PCP follow up.   At this time, I do not feel there is any life-threatening condition present. I have reviewed and discussed all results (EKG, imaging, lab, urine as appropriate), exam findings with patient. I have reviewed nursing notes and appropriate previous records.  I feel the patient is safe to be discharged home without further emergent workup. Discussed usual and customary return precautions. Patient and family (if present) verbalize understanding and are comfortable with this plan.  Patient will follow-up with their primary care provider. If they do not have a primary care provider, information for follow-up has been provided to them. All questions have been answered.  ____________________________________________  FINAL CLINICAL IMPRESSION(S) / ED DIAGNOSES  Final diagnoses:  Posterior chest pain  Acute non intractable tension-type headache  Shortness of breath  Anemia, unspecified type     MEDICATIONS GIVEN DURING  THIS VISIT:  Medications  ketorolac (TORADOL) 30 MG/ML injection 30 mg (30 mg Intravenous Given 07/25/17 1345)     NEW OUTPATIENT MEDICATIONS STARTED DURING THIS VISIT:  Discharge Medication List as of 07/25/2017  1:41 PM    START taking these medications   Details  ibuprofen (ADVIL,MOTRIN) 800 MG tablet Take 1 tablet (800 mg total) by mouth every 8 (eight) hours as needed., Starting Wed 07/25/2017, Print        Note:  This document was prepared using Dragon voice recognition software and may include unintentional dictation errors.  Nanda Quinton, MD Emergency Medicine    Long, Wonda Olds, MD 07/26/17 [phone]

## 2017-07-25 NOTE — ED Triage Notes (Signed)
Pt reports right sided back pain in between shoulder and spine, worse with coughing. Pt reports feeling sob at times. No cp.

## 2017-07-25 NOTE — Discharge Instructions (Signed)
We believe that your symptoms are caused by musculoskeletal strain.  Please read through the included information about additional care such as heating pads, over-the-counter pain medicine.  If you were provided a prescription please use it only as needed and as instructed.  Remember that early mobility and using the affected part of your body is actually better than keeping it immobile.  Your lab work did show some worsening anemia and you may benefit from starting back on your IV iron. Call your PCP today and schedule an appointment ASAP.   Follow-up with the doctor listed as recommended or return to the emergency department with new or worsening symptoms that concern you. We especially want to see you back here if you develop worsening chest pain or difficulty breathing.

## 2017-08-28 ENCOUNTER — Emergency Department (HOSPITAL_COMMUNITY)
Admission: EM | Admit: 2017-08-28 | Discharge: 2017-08-28 | Discharge status: Against Medical Advice | Payer: Medicaid Other

## 2017-08-28 ENCOUNTER — Emergency Department (HOSPITAL_COMMUNITY)
Admission: EM | Admit: 2017-08-28 | Discharge: 2017-08-28 | Disposition: A | Discharge status: Home / Self Care | Payer: Medicaid Other | Attending: Emergency Medicine | Admitting: Emergency Medicine

## 2017-08-28 ENCOUNTER — Encounter (HOSPITAL_COMMUNITY): Payer: Self-pay | Admitting: Emergency Medicine

## 2017-08-28 DIAGNOSIS — Z79899 Other long term (current) drug therapy: Secondary | ICD-10-CM | POA: Insufficient documentation

## 2017-08-28 DIAGNOSIS — R197 Diarrhea, unspecified: Secondary | ICD-10-CM | POA: Insufficient documentation

## 2017-08-28 DIAGNOSIS — J449 Chronic obstructive pulmonary disease, unspecified: Secondary | ICD-10-CM | POA: Diagnosis not present

## 2017-08-28 DIAGNOSIS — R110 Nausea: Secondary | ICD-10-CM

## 2017-08-28 LAB — COMPREHENSIVE METABOLIC PANEL
ALT: 15 U/L (ref 14–54)
AST: 19 U/L (ref 15–41)
Albumin: 3.5 g/dL (ref 3.5–5.0)
Alkaline Phosphatase: 61 U/L (ref 38–126)
Anion gap: 7 (ref 5–15)
BUN: 8 mg/dL (ref 6–20)
CO2: 25 mmol/L (ref 22–32)
Calcium: 9.1 mg/dL (ref 8.9–10.3)
Chloride: 104 mmol/L (ref 101–111)
Creatinine, Ser: 0.85 mg/dL (ref 0.44–1.00)
GFR calc Af Amer: >60 mL/min (ref >60)
GFR calc non Af Amer: >60 mL/min (ref >60)
Glucose, Bld: 128 mg/dL — ABNORMAL HIGH (ref 65–99)
Potassium: 3.6 mmol/L (ref 3.5–5.1)
Sodium: 136 mmol/L (ref 135–145)
Total Bilirubin: 0.4 mg/dL (ref 0.3–1.2)
Total Protein: 7 g/dL (ref 6.5–8.1)

## 2017-08-28 LAB — LIPASE, BLOOD: Lipase: 29 U/L (ref 11–51)

## 2017-08-28 LAB — CBC
HCT: 26.7 % — ABNORMAL LOW (ref 36.0–46.0)
Hemoglobin: 7.8 g/dL — ABNORMAL LOW (ref 12.0–15.0)
MCH: 21 pg — ABNORMAL LOW (ref 26.0–34.0)
MCHC: 29.2 g/dL — ABNORMAL LOW (ref 30.0–36.0)
MCV: 71.8 fL — ABNORMAL LOW (ref 78.0–100.0)
Platelets: 453 10*3/uL — ABNORMAL HIGH (ref 150–400)
RBC: 3.72 MIL/uL — ABNORMAL LOW (ref 3.87–5.11)
RDW: 17.9 % — ABNORMAL HIGH (ref 11.5–15.5)
WBC: 8.2 10*3/uL (ref 4.0–10.5)

## 2017-08-28 MED ORDER — METOCLOPRAMIDE HCL 5 MG/ML IJ SOLN
10.0000 mg | Freq: Once | INTRAMUSCULAR | Status: DC
  Filled 2017-08-28: qty 2

## 2017-08-28 MED ORDER — SODIUM CHLORIDE 0.9 % IV BOLUS (SEPSIS): 1000.0000 mL | Freq: Once | INTRAVENOUS | Status: DC

## 2017-08-28 NOTE — ED Triage Notes (Addendum)
Patient c/o headache, nausea, and worsening fatigue x1 month. Hx anemia and blood transfusion. Denies V/D.

## 2017-08-28 NOTE — ED Provider Notes (Signed)
[facility] DEPT Provider Note   CSN: [id number] Arrival date & time: 08/28/17  1217     History   Chief Complaint Chief Complaint  Patient presents with  . Nausea  . Headache  . Fatigue    HPI Nancy Dickerson is a 36 y.o. female presenting for increased tiredness, nausea, and diarrhea.  Patient states that for the past month, she has had increased tiredness.  She states that she is sleeping 12-14 hours a day, and feels like she could fall asleep at any moment.  She is sure this is because of her anemia and low potassium.  She does not take iron pills, stating that her liver cannot process them.  She needs iron supplementation via IV.  For the past 3 days, she reports persistent nausea and diarrhea.  She denies vomiting.  She has been able to tolerate p.o. without difficulty.  She denies fevers, chills, sore throat, nasal congestion, cough, chest pain, urinary symptoms, or leg pain or swelling.  Patient states she has a primary care doctor with whom she can follow-up.   HPI  Past Medical History:  Diagnosis Date  . Anemia   . Asthma   . Bursitis of hip   . Cardiomegaly   . Chondromalacia of hip   . Chondromalacia of right knee   . COPD (chronic obstructive pulmonary disease) ([place])   . GERD (gastroesophageal reflux disease)   . H/O transfusion of packed red blood cells   . Knee pain   . Tinnitus     There are no active problems to display for this patient.   History reviewed. No pertinent surgical history.  OB History    No data available       Home Medications    Prior to Admission medications   Medication Sig Start Date End Date Taking? Authorizing Provider  albuterol (PROVENTIL HFA;VENTOLIN HFA) 108 (90 Base) MCG/ACT inhaler Inhale 1-2 puffs into the lungs every 6 (six) hours as needed for wheezing or shortness of breath. 01/23/17   Long, Wonda Olds, MD  albuterol (PROVENTIL) (2.5 MG/3ML) 0.083% nebulizer solution Take 2.5 mg by  nebulization every 6 (six) hours as needed for wheezing.    [provider]  Butalbital-APAP-Caffeine 50-300-40 MG CAPS Take 1 capsule by mouth 3 (three) times daily as needed for headache. 07/11/17   [provider]  clotrimazole (LOTRIMIN) 1 % cream Apply to affected area 2 times daily Patient taking differently: Apply 1 application topically daily as needed.  06/30/14   Kirichenko, Lahoma Rocker, PA-C  diphenoxylate-atropine (LOMOTIL) 2.5-0.025 MG per tablet Take 1 tablet by mouth 4 (four) times daily as needed for diarrhea or loose stools. 06/30/14   Kirichenko, Tatyana, PA-C  fluticasone (FLOVENT HFA) 110 MCG/ACT inhaler Inhale 1 puff into the lungs 2 (two) times daily.    [provider]  hydrOXYzine (VISTARIL) 25 MG capsule Take 25 mg by mouth 3 (three) times daily as needed for anxiety. 07/11/17   [provider]  ibuprofen (ADVIL,MOTRIN) 800 MG tablet Take 1 tablet (800 mg total) by mouth every 8 (eight) hours as needed. 07/25/17   Long, Wonda Olds, MD  naproxen (NAPROSYN) 500 MG tablet Take 1 tablet (500 mg total) by mouth 2 (two) times daily. Patient not taking: Reported on 07/25/2017 05/10/16   Frederica Kuster, PA-C  Tiotropium Bromide-Olodaterol (STIOLTO RESPIMAT) 2.5-2.5 MCG/ACT AERS Inhale 2 puffs into the lungs daily.    [provider]    Family History History reviewed.  No pertinent family history.  Social History Social History   Tobacco Use  . Smoking status: Never Smoker  . Smokeless tobacco: Never Used  Substance Use Topics  . Alcohol use: Yes    Comment: rare  . Drug use: No     Allergies   Patient has no known allergies.   Review of Systems Review of Systems  Constitutional: Negative for chills and fever.  Gastrointestinal: Positive for diarrhea and nausea. Negative for abdominal distention, abdominal pain, blood in stool and vomiting.  Neurological: Positive for weakness (Generalized tiredness).     Physical  Exam Updated Vital Signs BP (!) 131/98 (BP Location: Right Arm)   Pulse 88   Temp 98.3 F (36.8 C) (Oral)   Resp 16   LMP 08/14/2017   SpO2 100%   Physical Exam  Constitutional: She is oriented to person, place, and time. She appears well-developed and well-nourished. No distress.  HENT:  Head: Normocephalic and atraumatic.  Eyes: EOM are normal.  Neck: Normal range of motion.  Cardiovascular: Normal rate, regular rhythm and intact distal pulses.  Pulmonary/Chest: Effort normal and breath sounds normal. No respiratory distress. She has no wheezes.  Abdominal: Soft. She exhibits no distension and no mass. There is no tenderness. There is no guarding.  Musculoskeletal: Normal range of motion.  Neurological: She is alert and oriented to person, place, and time.  Skin: Skin is warm and dry. No pallor.  Psychiatric: She has a normal mood and affect.  Nursing note and vitals reviewed.    ED Treatments / Results  Labs (all labs ordered are listed, but only abnormal results are displayed) Labs Reviewed  COMPREHENSIVE METABOLIC PANEL - Abnormal; Notable for the following components:      Result Value   Glucose, Bld 128 (*)    All other components within normal limits  CBC - Abnormal; Notable for the following components:   RBC 3.72 (*)    Hemoglobin 7.8 (*)    HCT 26.7 (*)    MCV 71.8 (*)    MCH 21.0 (*)    MCHC 29.2 (*)    RDW 17.9 (*)    Platelets 453 (*)    All other components within normal limits  LIPASE, BLOOD    EKG  EKG Interpretation None       Radiology No results found.  Procedures Procedures (including critical care time)  Medications Ordered in ED Medications - No data to display   Initial Impression / Assessment and Plan / ED Course  I have reviewed the triage vital signs and the nursing notes.  Pertinent labs & imaging results that were available during my care of the patient were reviewed by me and considered in my medical decision making  (see chart for details).     Patient presenting for evaluation of month long history of fatigue and 3-day onset of nausea and diarrhea.  Physical exam reassuring, patient is afebrile, not tachycardic, not hypotensive.  She does not appear toxic.  She does not appear clinically dehydrated.  She is not pale.  Basic labs show stable anemia, and no electrolyte abnormalities.  Urine pending.  Discussed findings with patient, discussed that I do not believe she needs a blood transfusion at this time.  Offered IV fluids and Reglan for symptom control.  GI symptoms likely due to viral illness.  Patient states she understands and agrees to plan.  When I went to reassess the patient, I was told by RN that patient was not in  the room when she went to give fluids and Reglan. Pt has not been seen in room since. I assume the pt eloped.   Final Clinical Impressions(s) / ED Diagnoses   Final diagnoses:  Nausea  Diarrhea, unspecified type    ED Discharge Orders    None       Franchot Heidelberg, PA-C 08/29/17 0038    Daleen Bo, MD 08/31/17 [phone]

## 2017-08-28 NOTE — ED Notes (Signed)
Patient eloped

## 2017-09-20 ENCOUNTER — Emergency Department (HOSPITAL_COMMUNITY)
Admission: EM | Admit: 2017-09-20 | Discharge: 2017-09-20 | Disposition: A | Discharge status: Home / Self Care | Payer: Medicaid Other | Attending: Emergency Medicine | Admitting: Emergency Medicine

## 2017-09-20 ENCOUNTER — Emergency Department (HOSPITAL_COMMUNITY): Payer: Medicaid Other

## 2017-09-20 ENCOUNTER — Other Ambulatory Visit: Payer: Self-pay

## 2017-09-20 ENCOUNTER — Encounter (HOSPITAL_COMMUNITY): Payer: Self-pay

## 2017-09-20 DIAGNOSIS — J45909 Unspecified asthma, uncomplicated: Secondary | ICD-10-CM | POA: Diagnosis not present

## 2017-09-20 DIAGNOSIS — Z791 Long term (current) use of non-steroidal anti-inflammatories (NSAID): Secondary | ICD-10-CM | POA: Diagnosis not present

## 2017-09-20 DIAGNOSIS — R0602 Shortness of breath: Secondary | ICD-10-CM | POA: Insufficient documentation

## 2017-09-20 DIAGNOSIS — M546 Pain in thoracic spine: Secondary | ICD-10-CM | POA: Diagnosis present

## 2017-09-20 DIAGNOSIS — D509 Iron deficiency anemia, unspecified: Secondary | ICD-10-CM | POA: Insufficient documentation

## 2017-09-20 DIAGNOSIS — J449 Chronic obstructive pulmonary disease, unspecified: Secondary | ICD-10-CM | POA: Insufficient documentation

## 2017-09-20 DIAGNOSIS — Z79899 Other long term (current) drug therapy: Secondary | ICD-10-CM | POA: Insufficient documentation

## 2017-09-20 LAB — D-DIMER, QUANTITATIVE: D-Dimer, Quant: 0.4 ug/mL-FEU (ref 0.00–0.50)

## 2017-09-20 MED ORDER — CYCLOBENZAPRINE HCL 10 MG PO TABS: 10.0000 mg | ORAL_TABLET | Freq: Two times a day (BID) | ORAL | 0 refills | Status: DC | PRN

## 2017-09-20 MED ORDER — HYDROCODONE-ACETAMINOPHEN 5-325 MG PO TABS: 1.0000 | ORAL_TABLET | ORAL | 0 refills | Status: DC | PRN

## 2017-09-20 MED ORDER — LIDOCAINE 5 % EX PTCH: 1.0000 | MEDICATED_PATCH | CUTANEOUS | 0 refills | Status: DC

## 2017-09-20 MED ORDER — IBUPROFEN 400 MG PO TABS
600.0000 mg | ORAL_TABLET | Freq: Once | ORAL | Status: AC
  Administered 2017-09-20: 600 mg via ORAL
  Filled 2017-09-20: qty 1

## 2017-09-20 MED ORDER — HYDROCODONE-ACETAMINOPHEN 5-325 MG PO TABS
1.0000 | ORAL_TABLET | Freq: Once | ORAL | Status: AC
  Administered 2017-09-20: 1 via ORAL
  Filled 2017-09-20: qty 1

## 2017-09-20 NOTE — ED Notes (Signed)
Unable to sign due to pad not being available in hall

## 2017-09-20 NOTE — ED Notes (Signed)
ED Provider at bedside. 

## 2017-09-20 NOTE — ED Provider Notes (Signed)
[place] EMERGENCY DEPARTMENT Provider Note   CSN: [id number] Arrival date & time: 09/20/17  0865     History   Chief Complaint No chief complaint on file.   HPI Nancy Dickerson is a 36 y.o. female.  HPI   [name] is a 36yo female with a history of asthma, COPD, chronic iron deficiency anemia (gets iron infusions) who presents to the Emergency Department for evaluation of right-sided thoracic back pain. She states that pain began overnight yesterday and has gradually worsened. Pain is 10/10 in severity, constant and "pressure-like." It is worsened when she takes a deep breath, sits up, stands, eats. Tried taking a muscle relaxer for her pain without significant relief. She denies recent injury or strenuous activity. She does endorse mild shortness of breath as well. She denies fever, night sweats, weight changes, numbness, weakness, loss of bowel or bladder control, abdominal pain, N/V, diarrhea, dysuria, urinary frequency, flank pain, chest pain, cough. She denies hemoptysis, leg swelling, recent surgery or immobility, exogenous estrogen use, previous DVT/PE. She is able to walk independently without difficulty. Denies history of cancer, IVDU.  Of note, she was seen at [facility] ED yesterday with similar complaint where had CBC, CMP, Lipase and Abdominal US work up for potential gallstones. Workup was unremarkable.    Past Medical History:  Diagnosis Date  . Anemia   . Asthma   . Bursitis of hip   . Cardiomegaly   . Chondromalacia of hip   . Chondromalacia of right knee   . COPD (chronic obstructive pulmonary disease) ([place])   . GERD (gastroesophageal reflux disease)   . H/O transfusion of packed red blood cells   . Knee pain   . Tinnitus     There are no active problems to display for this patient.   History reviewed. No pertinent surgical history.  OB History    No data available       Home Medications    Prior to Admission medications     Medication Sig Start Date End Date Taking? Authorizing Provider  albuterol (PROVENTIL HFA;VENTOLIN HFA) 108 (90 Base) MCG/ACT inhaler Inhale 1-2 puffs into the lungs every 6 (six) hours as needed for wheezing or shortness of breath. 01/23/17   Long, Wonda Olds, MD  albuterol (PROVENTIL) (2.5 MG/3ML) 0.083% nebulizer solution Take 2.5 mg by nebulization every 6 (six) hours as needed for wheezing.    [provider]  Butalbital-APAP-Caffeine 50-300-40 MG CAPS Take 1 capsule by mouth 3 (three) times daily as needed for headache. 07/11/17   [provider]  clotrimazole (LOTRIMIN) 1 % cream Apply to affected area 2 times daily Patient taking differently: Apply 1 application topically daily as needed.  06/30/14   Kirichenko, Lahoma Rocker, PA-C  diphenoxylate-atropine (LOMOTIL) 2.5-0.025 MG per tablet Take 1 tablet by mouth 4 (four) times daily as needed for diarrhea or loose stools. 06/30/14   Kirichenko, Tatyana, PA-C  fluticasone (FLOVENT HFA) 110 MCG/ACT inhaler Inhale 1 puff into the lungs 2 (two) times daily.    [provider]  hydrOXYzine (VISTARIL) 25 MG capsule Take 25 mg by mouth 3 (three) times daily as needed for anxiety. 07/11/17   [provider]  ibuprofen (ADVIL,MOTRIN) 800 MG tablet Take 1 tablet (800 mg total) by mouth every 8 (eight) hours as needed. 07/25/17   Long, Wonda Olds, MD  naproxen (NAPROSYN) 500 MG tablet Take 1 tablet (500 mg total) by mouth 2 (two) times daily. Patient not taking: Reported on 07/25/2017 05/10/16  Law, [place] M, PA-C  Tiotropium Bromide-Olodaterol (STIOLTO RESPIMAT) 2.5-2.5 MCG/ACT AERS Inhale 2 puffs into the lungs daily.    [provider]    Family History No family history on file.  Social History Social History   Tobacco Use  . Smoking status: Never Smoker  . Smokeless tobacco: Never Used  Substance Use Topics  . Alcohol use: Yes    Comment: rare  . Drug use: No     Allergies   Patient has no known  allergies.   Review of Systems Review of Systems  Constitutional: Negative for chills, fatigue and fever.  Respiratory: Positive for shortness of breath. Negative for cough, chest tightness and wheezing.   Cardiovascular: Negative for chest pain and leg swelling.  Gastrointestinal: Negative for abdominal pain, diarrhea, nausea and vomiting.  Genitourinary: Negative for difficulty urinating, dysuria, flank pain and frequency.  Musculoskeletal: Positive for back pain. Negative for gait problem and neck pain.  Skin: Negative for rash.  Neurological: Negative for weakness and numbness.     Physical Exam Updated Vital Signs BP 124/88 (BP Location: Right Arm)   Pulse 85   Temp 98.1 F (36.7 C) (Oral)   Resp 18   SpO2 100%   Physical Exam  Constitutional: She appears well-developed and well-nourished. No distress.  Patient nontoxic-appearing, no acute distress and sitting comfortably at the bedside.  HENT:  Head: Normocephalic and atraumatic.  Mouth/Throat: Oropharynx is clear and moist. No oropharyngeal exudate.  Eyes: Conjunctivae are normal. Pupils are equal, round, and reactive to light. Right eye exhibits no discharge. Left eye exhibits no discharge.  Neck: Normal range of motion. Neck supple.  No midline cervical spine tenderness  Cardiovascular: Normal rate, regular rhythm and intact distal pulses. Exam reveals no friction rub.  No murmur heard. Pulmonary/Chest: Effort normal and breath sounds normal. No stridor. No respiratory distress. She has no wheezes. She has no rales.  No chest wall tenderness.   Abdominal: Soft. Bowel sounds are normal. There is no tenderness. There is no guarding.  No pulsatile mass. No CVA tenderness. Negative Murphy's sign.   Musculoskeletal:  Mild tenderness to palpation over the right thoracic region.  No overlying ecchymosis, erythema, rash.  No tenderness to palpation over the spinous processes of the thoracic or lumbar spine.  Strength 5/5 in  plantar flexion and dorsiflexion bilaterally.  DP pulses 2+.  Neurological: She is alert. Coordination normal.  Patellar reflex 2+ bilaterally.  Distal sensation to light touch intact in bilateral lower extremities.  Gait normal in balance and coordination.  Skin: Skin is warm and dry. Capillary refill takes less than 2 seconds. She is not diaphoretic.  Psychiatric: She has a normal mood and affect. Her behavior is normal.  Nursing note and vitals reviewed.    ED Treatments / Results  Labs (all labs ordered are listed, but only abnormal results are displayed) Labs Reviewed  D-DIMER, QUANTITATIVE (NOT AT [facility])    EKG  EKG Interpretation None       Radiology No results found.  Procedures Procedures (including critical care time)  Medications Ordered in ED Medications  HYDROcodone-acetaminophen (NORCO/VICODIN) 5-325 MG per tablet 1 tablet (not administered)  ibuprofen (ADVIL,MOTRIN) tablet 600 mg (not administered)     Initial Impression / Assessment and Plan / ED Course  I have reviewed the triage vital signs and the nursing notes.  Pertinent labs & imaging results that were available during my care of the patient were reviewed by me and considered in my medical  decision making (see chart for details).    Patient presents with ongoing right-sided thoracic back pain.  CBC, CMP, lipase and gallbladder ultrasound were performed yesterday and unremarkable. No urinary symptoms or flank pain to suggest pyelonephritis or kidney stones.  She is PERC negative. Given that patient's pain is pleuritic and she endorses some shortness of breath will get a d-dimer to further evaluate. Discussed this patient with Dr. Sherry Ruffing who agrees with the above plan.   D-dimer negative. CXR without acute changes from previous (mild cardiomegaly, stable.) Suspect that her pain is musculoskeletal in nature as it is worsened with any type of movement. No neurological symptoms, loss of bowel or bladder  control. Exam unconcerning for cauda equina. No fever, night sweats, weight loss, h/o cancer, IVDU. RICE protocol and pain medicine indicated and discussed with patient. Discussed return precautions and patient agrees and voices understanding to the plan.   Final Clinical Impressions(s) / ED Diagnoses   Final diagnoses:  Acute right-sided thoracic back pain    ED Discharge Orders    None       Bernarda Caffey 09/20/17 1302    Tegeler, Gwenyth Allegra, MD 09/20/17 2027

## 2017-09-20 NOTE — ED Triage Notes (Signed)
Patient complains of thoracic back pain x 2 days, seen at [facility] yesterday and they evaluated patient for gallstones, discharged home. Denies trauma, pain with any ROM

## 2017-09-20 NOTE — Discharge Instructions (Signed)
Please take ibuprofen every 6 hours for pain.  I have also written you a prescription for muscle relaxer medicine called Flexeril.  This medicine can make you drowsy so please do not drive, work or drink alcohol while taking it.  Please apply heat to the back for your symptoms.  Schedule an appointment with your primary doctor for follow up on your ER visit today.   Return to the ER if you have worsening shortness of breath, if you develop chest pain, back pain with fever greater than 100.57F, back pain with numbness or weakness or any new or worsening symptoms.

## 2017-12-24 DIAGNOSIS — N390 Urinary tract infection, site not specified: Secondary | ICD-10-CM | POA: Insufficient documentation

## 2017-12-24 DIAGNOSIS — R197 Diarrhea, unspecified: Secondary | ICD-10-CM | POA: Insufficient documentation

## 2017-12-24 DIAGNOSIS — Z79899 Other long term (current) drug therapy: Secondary | ICD-10-CM | POA: Insufficient documentation

## 2017-12-24 DIAGNOSIS — J449 Chronic obstructive pulmonary disease, unspecified: Secondary | ICD-10-CM | POA: Insufficient documentation

## 2017-12-25 ENCOUNTER — Emergency Department (HOSPITAL_COMMUNITY)
Admission: EM | Admit: 2017-12-25 | Discharge: 2017-12-25 | Disposition: A | Discharge status: Home / Self Care | Payer: Self-pay | Attending: Emergency Medicine | Admitting: Emergency Medicine

## 2017-12-25 ENCOUNTER — Encounter (HOSPITAL_COMMUNITY): Payer: Self-pay | Admitting: Emergency Medicine

## 2017-12-25 DIAGNOSIS — R197 Diarrhea, unspecified: Secondary | ICD-10-CM

## 2017-12-25 DIAGNOSIS — R8281 Pyuria: Secondary | ICD-10-CM

## 2017-12-25 LAB — URINALYSIS, ROUTINE W REFLEX MICROSCOPIC
Bacteria, UA: NONE SEEN
Bilirubin Urine: NEGATIVE
Glucose, UA: NEGATIVE mg/dL
Ketones, ur: NEGATIVE mg/dL
Nitrite: NEGATIVE
Protein, ur: NEGATIVE mg/dL
Specific Gravity, Urine: 1.03 (ref 1.005–1.030)
pH: 6 (ref 5.0–8.0)

## 2017-12-25 LAB — COMPREHENSIVE METABOLIC PANEL
ALT: 20 U/L (ref 14–54)
AST: 21 U/L (ref 15–41)
Albumin: 3.7 g/dL (ref 3.5–5.0)
Alkaline Phosphatase: 41 U/L (ref 38–126)
Anion gap: 9 (ref 5–15)
BUN: 11 mg/dL (ref 6–20)
CO2: 25 mmol/L (ref 22–32)
Calcium: 9.1 mg/dL (ref 8.9–10.3)
Chloride: 102 mmol/L (ref 101–111)
Creatinine, Ser: 0.76 mg/dL (ref 0.44–1.00)
GFR calc Af Amer: >60 mL/min (ref >60)
GFR calc non Af Amer: >60 mL/min (ref >60)
Glucose, Bld: 113 mg/dL — ABNORMAL HIGH (ref 65–99)
Potassium: 3.5 mmol/L (ref 3.5–5.1)
Sodium: 136 mmol/L (ref 135–145)
Total Bilirubin: 0.3 mg/dL (ref 0.3–1.2)
Total Protein: 7.6 g/dL (ref 6.5–8.1)

## 2017-12-25 LAB — PREGNANCY, URINE: Preg Test, Ur: NEGATIVE

## 2017-12-25 LAB — I-STAT BETA HCG BLOOD, ED (MC, WL, AP ONLY): I-stat hCG, quantitative: 9 m[IU]/mL — ABNORMAL HIGH (ref <5)

## 2017-12-25 LAB — CBC
HCT: 27.7 % — ABNORMAL LOW (ref 36.0–46.0)
Hemoglobin: 7.8 g/dL — ABNORMAL LOW (ref 12.0–15.0)
MCH: 19.4 pg — ABNORMAL LOW (ref 26.0–34.0)
MCHC: 28.2 g/dL — ABNORMAL LOW (ref 30.0–36.0)
MCV: 68.7 fL — ABNORMAL LOW (ref 78.0–100.0)
Platelets: 390 10*3/uL (ref 150–400)
RBC: 4.03 MIL/uL (ref 3.87–5.11)
RDW: 19.3 % — ABNORMAL HIGH (ref 11.5–15.5)
WBC: 10.7 10*3/uL — ABNORMAL HIGH (ref 4.0–10.5)

## 2017-12-25 LAB — LIPASE, BLOOD: Lipase: 30 U/L (ref 11–51)

## 2017-12-25 MED ORDER — SODIUM CHLORIDE 0.9 % IV BOLUS (SEPSIS)
500.0000 mL | Freq: Once | INTRAVENOUS | Status: AC
  Administered 2017-12-25: 500 mL via INTRAVENOUS

## 2017-12-25 MED ORDER — KETOROLAC TROMETHAMINE 30 MG/ML IJ SOLN
30.0000 mg | Freq: Once | INTRAMUSCULAR | Status: AC
  Administered 2017-12-25: 30 mg via INTRAVENOUS
  Filled 2017-12-25: qty 1

## 2017-12-25 MED ORDER — DICYCLOMINE HCL 20 MG PO TABS: 20.0000 mg | ORAL_TABLET | Freq: Two times a day (BID) | ORAL | 0 refills | Status: DC | PRN

## 2017-12-25 MED ORDER — SODIUM CHLORIDE 0.9 % IV BOLUS (SEPSIS)
1000.0000 mL | Freq: Once | INTRAVENOUS | Status: AC
  Administered 2017-12-25: 1000 mL via INTRAVENOUS

## 2017-12-25 MED ORDER — NITROFURANTOIN MONOHYD MACRO 100 MG PO CAPS: 100.0000 mg | ORAL_CAPSULE | Freq: Two times a day (BID) | ORAL | 0 refills | Status: DC

## 2017-12-25 MED ORDER — DICYCLOMINE HCL 10 MG/ML IM SOLN
20.0000 mg | Freq: Once | INTRAMUSCULAR | Status: DC
  Filled 2017-12-25: qty 2

## 2017-12-25 MED ORDER — ONDANSETRON HCL 4 MG/2ML IJ SOLN
4.0000 mg | Freq: Once | INTRAMUSCULAR | Status: AC
  Administered 2017-12-25: 4 mg via INTRAVENOUS
  Filled 2017-12-25: qty 2

## 2017-12-25 MED ORDER — PROMETHAZINE HCL 25 MG PO TABS: 25.0000 mg | ORAL_TABLET | Freq: Four times a day (QID) | ORAL | 0 refills | Status: DC | PRN

## 2017-12-25 NOTE — ED Notes (Signed)
Pt. Made aware for the need of urine specimen. 

## 2017-12-25 NOTE — ED Provider Notes (Signed)
[place] DEPT Provider Note   CSN: [id number] Arrival date & time: 12/24/17  2201     History   Chief Complaint Chief Complaint  Patient presents with  . Nausea  . Emesis  . Diarrhea    HPI Nancy Dickerson is a 37 y.o. female.   37 year old female with a history of reflux, anemia, COPD, cardiomegaly presents to the emergency department for complaints of nausea and diarrhea.  She states that she was getting ready for work when she had sudden onset of watery, nonbloody diarrhea.  She has had more than 10 episodes since onset of her symptoms at approximately 1940.  She reports nausea without vomiting.  She has had some abdominal discomfort with her symptoms.  She denies taking any medications prior to arrival.  No fevers, dysuria, hematuria, history of abdominal surgeries.  LMP was earlier this month.  Patient denies sick contacts.     Past Medical History:  Diagnosis Date  . Anemia   . Asthma   . Bursitis of hip   . Cardiomegaly   . Chondromalacia of hip   . Chondromalacia of right knee   . COPD (chronic obstructive pulmonary disease) ([place])   . GERD (gastroesophageal reflux disease)   . H/O transfusion of packed red blood cells   . Knee pain   . Tinnitus     There are no active problems to display for this patient.   History reviewed. No pertinent surgical history.  OB History    No data available       Home Medications    Prior to Admission medications   Medication Sig Start Date End Date Taking? Authorizing Provider  albuterol (PROVENTIL HFA;VENTOLIN HFA) 108 (90 Base) MCG/ACT inhaler Inhale 1-2 puffs into the lungs every 6 (six) hours as needed for wheezing or shortness of breath. 01/23/17   Long, Wonda Olds, MD  albuterol (PROVENTIL) (2.5 MG/3ML) 0.083% nebulizer solution Take 2.5 mg by nebulization every 6 (six) hours as needed for wheezing.    [provider]  Butalbital-APAP-Caffeine 50-300-40 MG CAPS Take 1 capsule  by mouth 3 (three) times daily as needed for headache. 07/11/17   [provider]  clotrimazole (LOTRIMIN) 1 % cream Apply to affected area 2 times daily Patient taking differently: Apply 1 application topically daily as needed.  06/30/14   Kirichenko, Lahoma Rocker, PA-C  cyclobenzaprine (FLEXERIL) 10 MG tablet Take 1 tablet (10 mg total) by mouth 2 (two) times daily as needed for muscle spasms. 09/20/17   Glyn Ade, PA-C  dicyclomine (BENTYL) 20 MG tablet Take 1 tablet (20 mg total) by mouth every 12 (twelve) hours as needed (for abdominal pain/cramping). 12/25/17   Antonietta Breach, PA-C  diphenoxylate-atropine (LOMOTIL) 2.5-0.025 MG per tablet Take 1 tablet by mouth 4 (four) times daily as needed for diarrhea or loose stools. 06/30/14   Kirichenko, Tatyana, PA-C  fluticasone (FLOVENT HFA) 110 MCG/ACT inhaler Inhale 1 puff into the lungs 2 (two) times daily.    [provider]  HYDROcodone-acetaminophen (NORCO/VICODIN) 5-325 MG tablet Take 1 tablet by mouth every 4 (four) hours as needed. 09/20/17   Glyn Ade, PA-C  hydrOXYzine (VISTARIL) 25 MG capsule Take 25 mg by mouth 3 (three) times daily as needed for anxiety. 07/11/17   [provider]  ibuprofen (ADVIL,MOTRIN) 800 MG tablet Take 1 tablet (800 mg total) by mouth every 8 (eight) hours as needed. 07/25/17   Long, Wonda Olds, MD  lidocaine (LIDODERM) 5 % Place 1  patch onto the skin daily. Remove & Discard patch within 12 hours or as directed by MD 09/20/17   Glyn Ade, PA-C  naproxen (NAPROSYN) 500 MG tablet Take 1 tablet (500 mg total) by mouth 2 (two) times daily. Patient not taking: Reported on 07/25/2017 05/10/16   Frederica Kuster, PA-C  nitrofurantoin, macrocrystal-monohydrate, (MACROBID) 100 MG capsule Take 1 capsule (100 mg total) by mouth 2 (two) times daily. 12/25/17   Antonietta Breach, PA-C  promethazine (PHENERGAN) 25 MG tablet Take 1 tablet (25 mg total) by mouth every 6 (six) hours as needed for nausea  or vomiting. 12/25/17   Antonietta Breach, PA-C  Tiotropium Bromide-Olodaterol (STIOLTO RESPIMAT) 2.5-2.5 MCG/ACT AERS Inhale 2 puffs into the lungs daily.    [provider]    Family History No family history on file.  Social History Social History   Tobacco Use  . Smoking status: Never Smoker  . Smokeless tobacco: Never Used  Substance Use Topics  . Alcohol use: Yes    Comment: rare  . Drug use: No     Allergies   Patient has no known allergies.   Review of Systems Review of Systems Ten systems reviewed and are negative for acute change, except as noted in the HPI.    Physical Exam Updated Vital Signs BP 117/74 (BP Location: Right Arm)   Pulse 82   Temp 98.6 F (37 C) (Oral)   Resp 16   Ht 5\' 4"  (1.626 m)   Wt 101.6 kg (224 lb)   SpO2 96%   BMI 38.45 kg/m   Physical Exam  Constitutional: She is oriented to person, place, and time. She appears well-developed and well-nourished. No distress.  Nontoxic appearing and in NAD. Does seem fatigued.  HENT:  Head: Normocephalic and atraumatic.  Eyes: Conjunctivae and EOM are normal. No scleral icterus.  Neck: Normal range of motion.  Cardiovascular: Normal rate, regular rhythm and intact distal pulses.  Pulmonary/Chest: Effort normal. No stridor. No respiratory distress. She has no wheezes. She has no rales.  Lungs CTAB  Abdominal: Soft. There is no tenderness. There is no guarding.  Soft, obese abdomen with no focal TTP. No peritoneal signs.  Musculoskeletal: Normal range of motion.  Neurological: She is alert and oriented to person, place, and time. She exhibits normal muscle tone. Coordination normal.  Skin: Skin is warm and dry. No rash noted. She is not diaphoretic. No erythema. No pallor.  Psychiatric: She has a normal mood and affect. Her behavior is normal.  Nursing note and vitals reviewed.    ED Treatments / Results  Labs (all labs ordered are listed, but only abnormal results are displayed) Labs  Reviewed  COMPREHENSIVE METABOLIC PANEL - Abnormal; Notable for the following components:      Result Value   Glucose, Bld 113 (*)    All other components within normal limits  CBC - Abnormal; Notable for the following components:   WBC 10.7 (*)    Hemoglobin 7.8 (*)    HCT 27.7 (*)    MCV 68.7 (*)    MCH 19.4 (*)    MCHC 28.2 (*)    RDW 19.3 (*)    All other components within normal limits  URINALYSIS, ROUTINE W REFLEX MICROSCOPIC - Abnormal; Notable for the following components:   APPearance HAZY (*)    Hgb urine dipstick SMALL (*)    Leukocytes, UA LARGE (*)    Squamous Epithelial / LPF 0-5 (*)    All other components  within normal limits  I-STAT BETA HCG BLOOD, ED ([facility], [facility], [facility] ONLY) - Abnormal; Notable for the following components:   I-stat hCG, quantitative 9.0 (*)    All other components within normal limits  LIPASE, BLOOD  PREGNANCY, URINE  GC/CHLAMYDIA PROBE AMP ([place]) NOT AT [facility]    EKG  EKG Interpretation None       Radiology No results found.  Procedures Procedures (including critical care time)  Medications Ordered in ED Medications  sodium chloride 0.9 % bolus 1,000 mL (0 mLs Intravenous Stopped 12/25/17 0311)  ondansetron (ZOFRAN) injection 4 mg (4 mg Intravenous Given 12/25/17 0151)  sodium chloride 0.9 % bolus 500 mL (0 mLs Intravenous Stopped 12/25/17 0359)  ketorolac (TORADOL) 30 MG/ML injection 30 mg (30 mg Intravenous Given 12/25/17 0321)    3:15 AM Patient sleeping, in NAD. No ambulation to the bathroom for persistent diarrhea. Repeat abdominal exam stable.  4:47 AM Patient tolerating PO fluids. Resting comfortably. Labs generally reassuring and c/w baseline. Did discuss pyuria with the patient, but she denies urinary symptoms. GC/Chlamydia was added. Will cover for UTI with Macrobid.   Initial Impression / Assessment and Plan / ED Course  I have reviewed the triage vital signs and the nursing notes.  Pertinent labs & imaging results  that were available during my care of the patient were reviewed by me and considered in my medical decision making (see chart for details).     Patient with symptoms consistent with likely viral illness.  Vitals are stable, no fever.  No clinical signs of dehydration.  Hydrated in the ED with IVF.  Tolerating PO fluids and also reports improvement in abdominal pain after Toradol.  Abdomen soft without masses or peritoneal signs.  Labs reassuring and c/w baseline.  Doubt emergent etiology of symptoms.  Will continue with outpatient management with Bentyl and Phenergan.  Patient also started on Macrobid for concern for UTI given presence of pyuria.  GC/chlamydia ordered as an add-on which will not result during this encounter.  Patient advised to follow-up in the next 48 hours with her PCP to ensure resolution of symptoms.  Return precautions discussed and provided. Patient discharged in stable condition with no unaddressed concerns.   Final Clinical Impressions(s) / ED Diagnoses   Final diagnoses:  Diarrhea, unspecified type  Pyuria    ED Discharge Orders        Ordered    nitrofurantoin, macrocrystal-monohydrate, (MACROBID) 100 MG capsule  2 times daily     12/25/17 0432    promethazine (PHENERGAN) 25 MG tablet  Every 6 hours PRN     12/25/17 0432    dicyclomine (BENTYL) 20 MG tablet  Every 12 hours PRN     12/25/17 0432       Antonietta Breach, PA-C 12/25/17 Maybee, April, MD 12/25/17 [id number]

## 2017-12-25 NOTE — ED Triage Notes (Signed)
Patient here from home with complaints of nausea, vomiting, diarrhea that started today. Denies chest pain.

## 2018-01-06 ENCOUNTER — Encounter (HOSPITAL_COMMUNITY): Payer: Self-pay | Admitting: Nurse Practitioner

## 2018-01-06 ENCOUNTER — Emergency Department (HOSPITAL_COMMUNITY)
Admission: EM | Admit: 2018-01-06 | Discharge: 2018-01-07 | Disposition: A | Discharge status: Home / Self Care | Payer: Self-pay | Attending: Emergency Medicine | Admitting: Emergency Medicine

## 2018-01-06 DIAGNOSIS — J449 Chronic obstructive pulmonary disease, unspecified: Secondary | ICD-10-CM | POA: Insufficient documentation

## 2018-01-06 DIAGNOSIS — Z79899 Other long term (current) drug therapy: Secondary | ICD-10-CM | POA: Insufficient documentation

## 2018-01-06 DIAGNOSIS — M542 Cervicalgia: Secondary | ICD-10-CM | POA: Insufficient documentation

## 2018-01-06 DIAGNOSIS — G4489 Other headache syndrome: Secondary | ICD-10-CM | POA: Insufficient documentation

## 2018-01-06 LAB — BASIC METABOLIC PANEL
Anion gap: 8 (ref 5–15)
BUN: 8 mg/dL (ref 6–20)
CO2: 28 mmol/L (ref 22–32)
Calcium: 8.7 mg/dL — ABNORMAL LOW (ref 8.9–10.3)
Chloride: 104 mmol/L (ref 101–111)
Creatinine, Ser: 0.68 mg/dL (ref 0.44–1.00)
GFR calc Af Amer: >60 mL/min (ref >60)
GFR calc non Af Amer: >60 mL/min (ref >60)
Glucose, Bld: 104 mg/dL — ABNORMAL HIGH (ref 65–99)
Potassium: 3.4 mmol/L — ABNORMAL LOW (ref 3.5–5.1)
Sodium: 140 mmol/L (ref 135–145)

## 2018-01-06 LAB — CBC
HCT: 27.1 % — ABNORMAL LOW (ref 36.0–46.0)
Hemoglobin: 7.6 g/dL — ABNORMAL LOW (ref 12.0–15.0)
MCH: 19.3 pg — ABNORMAL LOW (ref 26.0–34.0)
MCHC: 28 g/dL — ABNORMAL LOW (ref 30.0–36.0)
MCV: 68.8 fL — ABNORMAL LOW (ref 78.0–100.0)
Platelets: 453 10*3/uL — ABNORMAL HIGH (ref 150–400)
RBC: 3.94 MIL/uL (ref 3.87–5.11)
RDW: 19.4 % — ABNORMAL HIGH (ref 11.5–15.5)
WBC: 8 10*3/uL (ref 4.0–10.5)

## 2018-01-06 LAB — I-STAT BETA HCG BLOOD, ED (MC, WL, AP ONLY): I-stat hCG, quantitative: <5 m[IU]/mL (ref <5)

## 2018-01-06 MED ORDER — METOCLOPRAMIDE HCL 5 MG/ML IJ SOLN
10.0000 mg | Freq: Once | INTRAMUSCULAR | Status: AC
  Administered 2018-01-07: 10 mg via INTRAVENOUS
  Filled 2018-01-06: qty 2

## 2018-01-06 MED ORDER — DIPHENHYDRAMINE HCL 50 MG/ML IJ SOLN
25.0000 mg | Freq: Once | INTRAMUSCULAR | Status: AC
  Administered 2018-01-07: 25 mg via INTRAVENOUS
  Filled 2018-01-06: qty 1

## 2018-01-06 NOTE — ED Provider Notes (Signed)
[facility] [place] DEPT Provider Note   CSN: [id number] Arrival date & time: 01/06/18  1848     History   Chief Complaint Chief Complaint  Patient presents with  . Headache  . Neck Pain    HPI Nancy Dickerson is a 37 y.o. female.  The history is provided by the patient.  Headache   This is a new problem. The current episode started more than 1 week ago. The problem occurs constantly. The problem has been gradually worsening. Associated with: Neck pain. The pain is located in the right unilateral region. The quality of the pain is described as throbbing. The pain is severe. Pertinent negatives include no fever, no syncope, no shortness of breath and no vomiting. Treatments tried: Home medications. The treatment provided no relief.  Neck Pain   This is a new problem. The current episode started more than 1 week ago. The problem occurs daily. The problem has been gradually worsening. There has been no fever. The pain is present in the right side. The pain is severe. Associated symptoms include visual change and headaches. Pertinent negatives include no chest pain, no syncope and no weakness. Associated symptoms comments: Blurred vision.  Patient with history of chronic anemia presents with right neck pain and headache.  She reports is been going on for a while, she is unable to tell me exactly when it started.  She does reports been worsening recently.  She reports she has pain in the right side of her neck, she feels some pulsation in her neck, and also hears some whooshing in her ear.  She also reports right-sided blurred vision.  No focal weakness.  No syncope.  She also has a knot on the back of her neck.  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 No known history of stroke  Past Medical History:  Diagnosis Date  . Anemia   . Asthma   . Bursitis of hip   . Cardiomegaly   . Chondromalacia of hip   . Chondromalacia of right knee   . COPD (chronic  obstructive pulmonary disease) ([place])   . GERD (gastroesophageal reflux disease)   . H/O transfusion of packed red blood cells   . Knee pain   . Tinnitus     There are no active problems to display for this patient.   History reviewed. No pertinent surgical history.   OB History   None      Home Medications    Prior to Admission medications   Medication Sig Start Date End Date Taking? Authorizing Provider  albuterol (PROVENTIL HFA;VENTOLIN HFA) 108 (90 Base) MCG/ACT inhaler Inhale 1-2 puffs into the lungs every 6 (six) hours as needed for wheezing or shortness of breath. 01/23/17   Long, Wonda Olds, MD  albuterol (PROVENTIL) (2.5 MG/3ML) 0.083% nebulizer solution Take 2.5 mg by nebulization every 6 (six) hours as needed for wheezing.    [provider]  Butalbital-APAP-Caffeine 50-300-40 MG CAPS Take 1 capsule by mouth 3 (three) times daily as needed for headache. 07/11/17   [provider]  clotrimazole (LOTRIMIN) 1 % cream Apply to affected area 2 times daily Patient taking differently: Apply 1 application topically daily as needed.  06/30/14   Kirichenko, Lahoma Rocker, PA-C  cyclobenzaprine (FLEXERIL) 10 MG tablet Take 1 tablet (10 mg total) by mouth 2 (two) times daily as needed for muscle spasms. 09/20/17   Glyn Ade, PA-C  dicyclomine (BENTYL) 20 MG tablet Take 1 tablet (20 mg  total) by mouth every 12 (twelve) hours as needed (for abdominal pain/cramping). 12/25/17   Antonietta Breach, PA-C  diphenoxylate-atropine (LOMOTIL) 2.5-0.025 MG per tablet Take 1 tablet by mouth 4 (four) times daily as needed for diarrhea or loose stools. 06/30/14   Kirichenko, Tatyana, PA-C  fluticasone (FLOVENT HFA) 110 MCG/ACT inhaler Inhale 1 puff into the lungs 2 (two) times daily.    [provider]  HYDROcodone-acetaminophen (NORCO/VICODIN) 5-325 MG tablet Take 1 tablet by mouth every 4 (four) hours as needed. 09/20/17   Glyn Ade, PA-C  hydrOXYzine (VISTARIL) 25 MG  capsule Take 25 mg by mouth 3 (three) times daily as needed for anxiety. 07/11/17   [provider]  ibuprofen (ADVIL,MOTRIN) 800 MG tablet Take 1 tablet (800 mg total) by mouth every 8 (eight) hours as needed. 07/25/17   Long, Wonda Olds, MD  lidocaine (LIDODERM) 5 % Place 1 patch onto the skin daily. Remove & Discard patch within 12 hours or as directed by MD 09/20/17   Glyn Ade, PA-C  naproxen (NAPROSYN) 500 MG tablet Take 1 tablet (500 mg total) by mouth 2 (two) times daily. Patient not taking: Reported on 07/25/2017 05/10/16   Frederica Kuster, PA-C  nitrofurantoin, macrocrystal-monohydrate, (MACROBID) 100 MG capsule Take 1 capsule (100 mg total) by mouth 2 (two) times daily. 12/25/17   Antonietta Breach, PA-C  promethazine (PHENERGAN) 25 MG tablet Take 1 tablet (25 mg total) by mouth every 6 (six) hours as needed for nausea or vomiting. 12/25/17   Antonietta Breach, PA-C  Tiotropium Bromide-Olodaterol (STIOLTO RESPIMAT) 2.5-2.5 MCG/ACT AERS Inhale 2 puffs into the lungs daily.    [provider]    Family History History reviewed. No pertinent family history.  Social History Social History   Tobacco Use  . Smoking status: Never Smoker  . Smokeless tobacco: Never Used  Substance Use Topics  . Alcohol use: Yes    Comment: rare  . Drug use: No     Allergies   Patient has no known allergies.   Review of Systems Review of Systems  Constitutional: Negative for fever.  HENT: Negative for sore throat.   Eyes: Positive for visual disturbance.  Respiratory: Negative for shortness of breath.   Cardiovascular: Negative for chest pain and syncope.  Gastrointestinal: Negative for vomiting.  Musculoskeletal: Positive for neck pain.  Neurological: Positive for headaches. Negative for syncope and weakness.  All other systems reviewed and are negative.    Physical Exam Updated Vital Signs BP (!) 138/96 (BP Location: Right Arm)   Pulse 80   Temp 98.3 F (36.8 C) (Oral)    Resp 16   Ht 1.626 m (5\' 4" )   Wt 101.2 kg (223 lb)   SpO2 100%   BMI 38.28 kg/m   Physical Exam  CONSTITUTIONAL: Well developed/well nourished HEAD: Normocephalic/atraumatic EYES: EOMI/PERRL, no nystagmus, no ptosis ENMT: Mucous membranes moist, tonsillar hypertrophy noted NECK: supple no meningeal signs, no bruits, no thrill, pulsation noted in the neck bilaterally Questionable small enlarged lymph node posterior neck, no erythema or tenderness CV: S1/S2 noted, no murmurs/rubs/gallops noted LUNGS: Lungs are clear to auscultation bilaterally, no apparent distress ABDOMEN: soft, nontender, no rebound or guarding GU:no cva tenderness NEURO:Awake/alert, face symmetric, no arm or leg drift is noted Equal 5/5 strength with shoulder abduction, equal hand grips bilaterally Equal 5/5 strength with hip flexion,knee flex/extension, Cranial nerves 3/4/5/6/04/16/09/11/12 tested and intact Gait normal without ataxia No past pointing Sensation to light touch intact in all extremities EXTREMITIES: pulses  normal, full ROM SKIN: warm, color normal PSYCH: no abnormalities of mood noted   ED Treatments / Results  Labs (all labs ordered are listed, but only abnormal results are displayed) Labs Reviewed  BASIC METABOLIC PANEL - Abnormal; Notable for the following components:      Result Value   Potassium 3.4 (*)    Glucose, Bld 104 (*)    Calcium 8.7 (*)    All other components within normal limits  CBC - Abnormal; Notable for the following components:   Hemoglobin 7.6 (*)    HCT 27.1 (*)    MCV 68.8 (*)    MCH 19.3 (*)    MCHC 28.0 (*)    RDW 19.4 (*)    Platelets 453 (*)    All other components within normal limits  I-STAT BETA HCG BLOOD, ED ([facility], [facility], [facility] ONLY)    EKG None  Radiology Ct Angio Head W Or Wo Contrast  Result Date: 01/07/2018 CLINICAL DATA:  Head and neck pain. Pulsatile sensation RIGHT neck. LEFT posterior neck mass. Inflamed tonsils. EXAM: CT ANGIOGRAPHY HEAD AND  NECK TECHNIQUE: Multidetector CT imaging of the head and neck was performed using the standard protocol during bolus administration of intravenous contrast. Multiplanar CT image reconstructions and MIPs were obtained to evaluate the vascular anatomy. Carotid stenosis measurements (when applicable) are obtained utilizing NASCET criteria, using the distal internal carotid diameter as the denominator. CONTRAST:  15mL ISOVUE-370 IOPAMIDOL (ISOVUE-370) INJECTION 76% COMPARISON:  None. FINDINGS: CT HEAD FINDINGS BRAIN: No intraparenchymal hemorrhage, mass effect nor midline shift. The ventricles and sulci are normal. No acute large vascular territory infarcts. No abnormal extra-axial fluid collections. Basal cisterns are patent. VASCULAR: Unremarkable. SKULL/SOFT TISSUES: No skull fracture. No significant soft tissue swelling. ORBITS/SINUSES: Proptosis.The mastoid aircells and included paranasal sinuses are well-aerated. OTHER: None. CTA NECK FINDINGS: AORTIC ARCH: Normal appearance of the thoracic arch, normal branch pattern. The origins of the innominate, left Common carotid artery and subclavian artery are widely patent. RIGHT CAROTID SYSTEM: Common carotid artery is widely patent, coursing in a straight line fashion. Normal appearance of the carotid bifurcation without hemodynamically significant stenosis by NASCET criteria. Normal appearance of the internal carotid artery. LEFT CAROTID SYSTEM: Common carotid artery is widely patent, coursing in a straight line fashion. Normal appearance of the carotid bifurcation without hemodynamically significant stenosis by NASCET criteria. Normal appearance of the internal carotid artery. VERTEBRAL ARTERIES:LEFT vertebral artery origin obscured by venous contamination streak artifact. Codominant patent vertebral artery's. SKELETON: No acute osseous process though bone windows have not been submitted. OTHER NECK: Soft tissues of the neck are nonacute though, not tailored for  evaluation. Mildly patulous RIGHT internal jugular vein, normal variant. Prominent though not pathologically enlarged cervical lymph nodes are likely reactive. UPPER CHEST: Heterogeneous lung attenuation seen with small airway disease. CTA HEAD FINDINGS: ANTERIOR CIRCULATION: Patent cervical internal carotid arteries, petrous, cavernous and supra clinoid internal carotid arteries. Patent anterior communicating artery. Patent anterior and middle cerebral arteries. No large vessel occlusion, significant stenosis, contrast extravasation or aneurysm. POSTERIOR CIRCULATION: Patent vertebral arteries, vertebrobasilar junction and basilar artery, as well as main branch vessels. Patent posterior cerebral arteries. No large vessel occlusion, significant stenosis, contrast extravasation or aneurysm. VENOUS SINUSES: Major dural venous sinuses are patent though not tailored for evaluation on this angiographic examination. ANATOMIC VARIANTS: None. DELAYED PHASE: No abnormal intracranial enhancement. MIP images reviewed. IMPRESSION: 1. Normal CT HEAD with and without contrast. 2. Normal CTA NECK. 3. Normal CTA HEAD. Electronically Signed  By: Elon Alas M.D.   On: 01/07/2018 01:29   Ct Angio Neck W And/or Wo Contrast  Result Date: 01/07/2018 CLINICAL DATA:  Head and neck pain. Pulsatile sensation RIGHT neck. LEFT posterior neck mass. Inflamed tonsils. EXAM: CT ANGIOGRAPHY HEAD AND NECK TECHNIQUE: Multidetector CT imaging of the head and neck was performed using the standard protocol during bolus administration of intravenous contrast. Multiplanar CT image reconstructions and MIPs were obtained to evaluate the vascular anatomy. Carotid stenosis measurements (when applicable) are obtained utilizing NASCET criteria, using the distal internal carotid diameter as the denominator. CONTRAST:  183mL ISOVUE-370 IOPAMIDOL (ISOVUE-370) INJECTION 76% COMPARISON:  None. FINDINGS: CT HEAD FINDINGS BRAIN: No intraparenchymal  hemorrhage, mass effect nor midline shift. The ventricles and sulci are normal. No acute large vascular territory infarcts. No abnormal extra-axial fluid collections. Basal cisterns are patent. VASCULAR: Unremarkable. SKULL/SOFT TISSUES: No skull fracture. No significant soft tissue swelling. ORBITS/SINUSES: Proptosis.The mastoid aircells and included paranasal sinuses are well-aerated. OTHER: None. CTA NECK FINDINGS: AORTIC ARCH: Normal appearance of the thoracic arch, normal branch pattern. The origins of the innominate, left Common carotid artery and subclavian artery are widely patent. RIGHT CAROTID SYSTEM: Common carotid artery is widely patent, coursing in a straight line fashion. Normal appearance of the carotid bifurcation without hemodynamically significant stenosis by NASCET criteria. Normal appearance of the internal carotid artery. LEFT CAROTID SYSTEM: Common carotid artery is widely patent, coursing in a straight line fashion. Normal appearance of the carotid bifurcation without hemodynamically significant stenosis by NASCET criteria. Normal appearance of the internal carotid artery. VERTEBRAL ARTERIES:LEFT vertebral artery origin obscured by venous contamination streak artifact. Codominant patent vertebral artery's. SKELETON: No acute osseous process though bone windows have not been submitted. OTHER NECK: Soft tissues of the neck are nonacute though, not tailored for evaluation. Mildly patulous RIGHT internal jugular vein, normal variant. Prominent though not pathologically enlarged cervical lymph nodes are likely reactive. UPPER CHEST: Heterogeneous lung attenuation seen with small airway disease. CTA HEAD FINDINGS: ANTERIOR CIRCULATION: Patent cervical internal carotid arteries, petrous, cavernous and supra clinoid internal carotid arteries. Patent anterior communicating artery. Patent anterior and middle cerebral arteries. No large vessel occlusion, significant stenosis, contrast extravasation or  aneurysm. POSTERIOR CIRCULATION: Patent vertebral arteries, vertebrobasilar junction and basilar artery, as well as main branch vessels. Patent posterior cerebral arteries. No large vessel occlusion, significant stenosis, contrast extravasation or aneurysm. VENOUS SINUSES: Major dural venous sinuses are patent though not tailored for evaluation on this angiographic examination. ANATOMIC VARIANTS: None. DELAYED PHASE: No abnormal intracranial enhancement. MIP images reviewed. IMPRESSION: 1. Normal CT HEAD with and without contrast. 2. Normal CTA NECK. 3. Normal CTA HEAD. Electronically Signed   By: Elon Alas M.D.   On: 01/07/2018 01:29    Procedures Procedures (including critical care time)  Medications Ordered in ED Medications  metoCLOPramide (REGLAN) injection 10 mg (has no administration in time range)  diphenhydrAMINE (BENADRYL) injection 25 mg (has no administration in time range)     Initial Impression / Assessment and Plan / ED Course  I have reviewed the triage vital signs and the nursing notes.  Pertinent labs results that were available during my care of the patient were reviewed by me and considered in my medical decision making (see chart for details).    Anemia at baseline. 11:54 PM Patient presents with headache and right-sided neck pain for unknown time frame.  She reports getting worse.  She is requesting full evaluation including imaging of her head and neck.  CT angios  ordered 2:56 AM Patient resting comfortably, no distress, no focal weakness.  We discussed CT findings.  Refer to neurology Final Clinical Impressions(s) / ED Diagnoses   Final diagnoses:  Neck pain  Other headache syndrome    ED Discharge Orders    None       Ripley Fraise, MD 01/07/18 [phone]

## 2018-01-06 NOTE — ED Triage Notes (Signed)
Pt is c/o a HA, neck pain that she states is associated with a knot she has has on the back of her neck and also she is experiencing a pulsating sensation on the right carotid. Quick vision inspection of her mouth reveals 2+ inflamed tonsil, pt denies pain swallowing, fever or chills.

## 2018-01-07 ENCOUNTER — Encounter (HOSPITAL_COMMUNITY): Payer: Self-pay

## 2018-01-07 ENCOUNTER — Emergency Department (HOSPITAL_COMMUNITY): Payer: Self-pay

## 2018-01-07 MED ORDER — IOPAMIDOL (ISOVUE-370) INJECTION 76%
100.0000 mL | Freq: Once | INTRAVENOUS | Status: AC | PRN
  Administered 2018-01-07: 100 mL via INTRAVENOUS

## 2018-01-07 MED ORDER — IOPAMIDOL (ISOVUE-370) INJECTION 76%
INTRAVENOUS | Status: AC
  Filled 2018-01-07: qty 100

## 2018-01-07 NOTE — Discharge Instructions (Signed)

## 2018-01-29 ENCOUNTER — Observation Stay (HOSPITAL_BASED_OUTPATIENT_CLINIC_OR_DEPARTMENT_OTHER): Payer: Medicaid Other

## 2018-01-29 ENCOUNTER — Emergency Department (HOSPITAL_COMMUNITY): Payer: Medicaid Other

## 2018-01-29 ENCOUNTER — Ambulatory Visit (HOSPITAL_BASED_OUTPATIENT_CLINIC_OR_DEPARTMENT_OTHER): Payer: Medicaid Other

## 2018-01-29 ENCOUNTER — Encounter (HOSPITAL_COMMUNITY): Payer: Self-pay | Admitting: *Deleted

## 2018-01-29 ENCOUNTER — Other Ambulatory Visit: Payer: Self-pay

## 2018-01-29 ENCOUNTER — Observation Stay (HOSPITAL_COMMUNITY)
Admission: EM | Admit: 2018-01-29 | Discharge: 2018-01-30 | Disposition: A | Discharge status: Home / Self Care | Payer: Medicaid Other | Attending: Internal Medicine | Admitting: Internal Medicine

## 2018-01-29 DIAGNOSIS — R42 Dizziness and giddiness: Secondary | ICD-10-CM | POA: Diagnosis not present

## 2018-01-29 DIAGNOSIS — D500 Iron deficiency anemia secondary to blood loss (chronic): Secondary | ICD-10-CM

## 2018-01-29 DIAGNOSIS — D649 Anemia, unspecified: Secondary | ICD-10-CM

## 2018-01-29 DIAGNOSIS — J449 Chronic obstructive pulmonary disease, unspecified: Secondary | ICD-10-CM | POA: Diagnosis not present

## 2018-01-29 DIAGNOSIS — Z79899 Other long term (current) drug therapy: Secondary | ICD-10-CM | POA: Diagnosis not present

## 2018-01-29 DIAGNOSIS — R51 Headache: Secondary | ICD-10-CM | POA: Insufficient documentation

## 2018-01-29 DIAGNOSIS — R531 Weakness: Secondary | ICD-10-CM

## 2018-01-29 DIAGNOSIS — D509 Iron deficiency anemia, unspecified: Secondary | ICD-10-CM | POA: Insufficient documentation

## 2018-01-29 DIAGNOSIS — I639 Cerebral infarction, unspecified: Secondary | ICD-10-CM

## 2018-01-29 DIAGNOSIS — R202 Paresthesia of skin: Secondary | ICD-10-CM | POA: Insufficient documentation

## 2018-01-29 DIAGNOSIS — R079 Chest pain, unspecified: Secondary | ICD-10-CM

## 2018-01-29 DIAGNOSIS — E876 Hypokalemia: Secondary | ICD-10-CM | POA: Diagnosis present

## 2018-01-29 LAB — COMPREHENSIVE METABOLIC PANEL
ALT: 16 U/L (ref 14–54)
AST: 19 U/L (ref 15–41)
Albumin: 3.6 g/dL (ref 3.5–5.0)
Alkaline Phosphatase: 55 U/L (ref 38–126)
Anion gap: 11 (ref 5–15)
BUN: 7 mg/dL (ref 6–20)
CO2: 20 mmol/L — ABNORMAL LOW (ref 22–32)
Calcium: 8.3 mg/dL — ABNORMAL LOW (ref 8.9–10.3)
Chloride: 104 mmol/L (ref 101–111)
Creatinine, Ser: 0.72 mg/dL (ref 0.44–1.00)
GFR calc Af Amer: >60 mL/min (ref >60)
GFR calc non Af Amer: >60 mL/min (ref >60)
Glucose, Bld: 163 mg/dL — ABNORMAL HIGH (ref 65–99)
Potassium: 2.6 mmol/L — CL (ref 3.5–5.1)
Sodium: 135 mmol/L (ref 135–145)
Total Bilirubin: 0.3 mg/dL (ref 0.3–1.2)
Total Protein: 7 g/dL (ref 6.5–8.1)

## 2018-01-29 LAB — URINALYSIS, ROUTINE W REFLEX MICROSCOPIC
Bacteria, UA: NONE SEEN
Bilirubin Urine: NEGATIVE
Glucose, UA: NEGATIVE mg/dL
Ketones, ur: NEGATIVE mg/dL
Nitrite: NEGATIVE
Protein, ur: NEGATIVE mg/dL
Specific Gravity, Urine: 1.009 (ref 1.005–1.030)
pH: 6 (ref 5.0–8.0)

## 2018-01-29 LAB — CBC
HCT: 25.6 % — ABNORMAL LOW (ref 36.0–46.0)
HCT: 24.3 % — ABNORMAL LOW (ref 36.0–46.0)
Hemoglobin: 7.1 g/dL — ABNORMAL LOW (ref 12.0–15.0)
Hemoglobin: 6.7 g/dL — CL (ref 12.0–15.0)
MCH: 19.1 pg — ABNORMAL LOW (ref 26.0–34.0)
MCH: 19 pg — ABNORMAL LOW (ref 26.0–34.0)
MCHC: 27.7 g/dL — ABNORMAL LOW (ref 30.0–36.0)
MCHC: 27.6 g/dL — ABNORMAL LOW (ref 30.0–36.0)
MCV: 68.8 fL — ABNORMAL LOW (ref 78.0–100.0)
MCV: 69 fL — ABNORMAL LOW (ref 78.0–100.0)
Platelets: 545 10*3/uL — ABNORMAL HIGH (ref 150–400)
Platelets: 431 10*3/uL — ABNORMAL HIGH (ref 150–400)
RBC: 3.72 MIL/uL — ABNORMAL LOW (ref 3.87–5.11)
RBC: 3.52 MIL/uL — ABNORMAL LOW (ref 3.87–5.11)
RDW: 20.5 % — ABNORMAL HIGH (ref 11.5–15.5)
RDW: 20.5 % — ABNORMAL HIGH (ref 11.5–15.5)
WBC: 11.8 10*3/uL — ABNORMAL HIGH (ref 4.0–10.5)
WBC: 7.4 10*3/uL (ref 4.0–10.5)

## 2018-01-29 LAB — PROTIME-INR
INR: 1.09
Prothrombin Time: 14.1 seconds (ref 11.4–15.2)

## 2018-01-29 LAB — ECHOCARDIOGRAM COMPLETE
Height: 64 in
Weight: 3880.1 oz

## 2018-01-29 LAB — DIFFERENTIAL
Basophils Absolute: 0 10*3/uL (ref 0.0–0.1)
Basophils Relative: 0 %
Eosinophils Absolute: 0.2 10*3/uL (ref 0.0–0.7)
Eosinophils Relative: 2 %
Lymphocytes Relative: 54 %
Lymphs Abs: 6.4 10*3/uL — ABNORMAL HIGH (ref 0.7–4.0)
Monocytes Absolute: 0.8 10*3/uL (ref 0.1–1.0)
Monocytes Relative: 7 %
Neutro Abs: 4.4 10*3/uL (ref 1.7–7.7)
Neutrophils Relative %: 37 %

## 2018-01-29 LAB — CBG MONITORING, ED: Glucose-Capillary: 130 mg/dL — ABNORMAL HIGH (ref 65–99)

## 2018-01-29 LAB — I-STAT CHEM 8, ED
BUN: 8 mg/dL (ref 6–20)
Calcium, Ion: 1.1 mmol/L — ABNORMAL LOW (ref 1.15–1.40)
Chloride: 102 mmol/L (ref 101–111)
Creatinine, Ser: 0.7 mg/dL (ref 0.44–1.00)
Glucose, Bld: 167 mg/dL — ABNORMAL HIGH (ref 65–99)
HCT: 27 % — ABNORMAL LOW (ref 36.0–46.0)
Hemoglobin: 9.2 g/dL — ABNORMAL LOW (ref 12.0–15.0)
Potassium: 2.7 mmol/L — CL (ref 3.5–5.1)
Sodium: 139 mmol/L (ref 135–145)
TCO2: 21 mmol/L — ABNORMAL LOW (ref 22–32)

## 2018-01-29 LAB — ETHANOL: Alcohol, Ethyl (B): <10 mg/dL (ref <10)

## 2018-01-29 LAB — CREATININE, SERUM
Creatinine, Ser: 0.64 mg/dL (ref 0.44–1.00)
GFR calc Af Amer: >60 mL/min (ref >60)
GFR calc non Af Amer: >60 mL/min (ref >60)

## 2018-01-29 LAB — I-STAT TROPONIN, ED: Troponin i, poc: 0 ng/mL (ref 0.00–0.08)

## 2018-01-29 LAB — I-STAT BETA HCG BLOOD, ED (MC, WL, AP ONLY): I-stat hCG, quantitative: <5 m[IU]/mL (ref <5)

## 2018-01-29 LAB — POTASSIUM: Potassium: 4.3 mmol/L (ref 3.5–5.1)

## 2018-01-29 LAB — IRON AND TIBC
Iron: 9 ug/dL — ABNORMAL LOW (ref 28–170)
Saturation Ratios: 2 % — ABNORMAL LOW (ref 10.4–31.8)
TIBC: 400 ug/dL (ref 250–450)
UIBC: 391 ug/dL

## 2018-01-29 LAB — FOLATE: Folate: 12.5 ng/mL (ref >5.9)

## 2018-01-29 LAB — VITAMIN B12: Vitamin B-12: 312 pg/mL (ref 180–914)

## 2018-01-29 LAB — MAGNESIUM: Magnesium: 1.8 mg/dL (ref 1.7–2.4)

## 2018-01-29 LAB — RETICULOCYTES
RBC.: 3.57 MIL/uL — ABNORMAL LOW (ref 3.87–5.11)
Retic Count, Absolute: 50 10*3/uL (ref 19.0–186.0)
Retic Ct Pct: 1.4 % (ref 0.4–3.1)

## 2018-01-29 LAB — APTT: aPTT: 26 seconds (ref 24–36)

## 2018-01-29 LAB — PREPARE RBC (CROSSMATCH)

## 2018-01-29 LAB — RAPID URINE DRUG SCREEN, HOSP PERFORMED
Amphetamines: NOT DETECTED
Barbiturates: NOT DETECTED
Benzodiazepines: NOT DETECTED
Cocaine: NOT DETECTED
Opiates: NOT DETECTED
Tetrahydrocannabinol: POSITIVE — AB

## 2018-01-29 LAB — FERRITIN: Ferritin: 4 ng/mL — ABNORMAL LOW (ref 11–307)

## 2018-01-29 LAB — ABO/RH: ABO/RH(D): O POS

## 2018-01-29 MED ORDER — ONDANSETRON HCL 4 MG/2ML IJ SOLN: 4.0000 mg | Freq: Four times a day (QID) | INTRAMUSCULAR | Status: DC | PRN

## 2018-01-29 MED ORDER — POTASSIUM CHLORIDE 10 MEQ/100ML IV SOLN
10.0000 meq | INTRAVENOUS | Status: AC
  Administered 2018-01-29 (×2): 10 meq via INTRAVENOUS
  Filled 2018-01-29: qty 100

## 2018-01-29 MED ORDER — ACETAMINOPHEN 325 MG PO TABS
650.0000 mg | ORAL_TABLET | Freq: Four times a day (QID) | ORAL | Status: DC | PRN
  Administered 2018-01-30: 650 mg via ORAL

## 2018-01-29 MED ORDER — SODIUM CHLORIDE 0.9 % IV SOLN
Freq: Once | INTRAVENOUS | Status: AC
  Administered 2018-01-29: 15:00:00 via INTRAVENOUS

## 2018-01-29 MED ORDER — POTASSIUM CHLORIDE CRYS ER 20 MEQ PO TBCR
40.0000 meq | EXTENDED_RELEASE_TABLET | ORAL | Status: AC
  Administered 2018-01-29: 40 meq via ORAL
  Filled 2018-01-29: qty 2

## 2018-01-29 MED ORDER — PROMETHAZINE HCL 25 MG/ML IJ SOLN
25.0000 mg | Freq: Once | INTRAMUSCULAR | Status: AC
  Administered 2018-01-29: 25 mg via INTRAVENOUS
  Filled 2018-01-29: qty 1

## 2018-01-29 MED ORDER — STROKE: EARLY STAGES OF RECOVERY BOOK
Freq: Once | Status: DC
  Filled 2018-01-29: qty 1

## 2018-01-29 MED ORDER — ENOXAPARIN SODIUM 40 MG/0.4ML ~~LOC~~ SOLN
40.0000 mg | SUBCUTANEOUS | Status: DC
  Administered 2018-01-29: 40 mg via SUBCUTANEOUS
  Filled 2018-01-29: qty 0.4

## 2018-01-29 MED ORDER — POTASSIUM CHLORIDE 10 MEQ/100ML IV SOLN: 10.0000 meq | Freq: Once | INTRAVENOUS | Status: DC

## 2018-01-29 MED ORDER — POTASSIUM CHLORIDE IN NACL 40-0.9 MEQ/L-% IV SOLN
INTRAVENOUS | Status: DC
  Administered 2018-01-29: 100 mL/h via INTRAVENOUS
  Filled 2018-01-29: qty 1000

## 2018-01-29 MED ORDER — SODIUM CHLORIDE 0.9 % IV BOLUS
500.0000 mL | Freq: Once | INTRAVENOUS | Status: AC
  Administered 2018-01-29: 500 mL via INTRAVENOUS

## 2018-01-29 MED ORDER — ONDANSETRON HCL 4 MG PO TABS: 4.0000 mg | ORAL_TABLET | Freq: Four times a day (QID) | ORAL | Status: DC | PRN

## 2018-01-29 MED ORDER — SODIUM CHLORIDE 0.9 % IV SOLN
100.0000 mL/h | INTRAVENOUS | Status: DC
  Administered 2018-01-29: 100 mL/h via INTRAVENOUS

## 2018-01-29 MED ORDER — POTASSIUM CHLORIDE 10 MEQ/100ML IV SOLN
10.0000 meq | INTRAVENOUS | Status: AC
  Administered 2018-01-29: 10 meq via INTRAVENOUS
  Filled 2018-01-29 (×2): qty 100

## 2018-01-29 MED ORDER — ACETAMINOPHEN 650 MG RE SUPP: 650.0000 mg | Freq: Four times a day (QID) | RECTAL | Status: DC | PRN

## 2018-01-29 MED ORDER — PROCHLORPERAZINE EDISYLATE 10 MG/2ML IJ SOLN
10.0000 mg | Freq: Once | INTRAMUSCULAR | Status: AC
  Administered 2018-01-29: 10 mg via INTRAVENOUS
  Filled 2018-01-29: qty 2

## 2018-01-29 MED ORDER — ASPIRIN EC 81 MG PO TBEC
81.0000 mg | DELAYED_RELEASE_TABLET | Freq: Every day | ORAL | Status: DC
  Administered 2018-01-30: 81 mg via ORAL
  Filled 2018-01-29: qty 1

## 2018-01-29 MED ORDER — POTASSIUM CHLORIDE 10 MEQ/100ML IV SOLN: 10.0000 meq | INTRAVENOUS | Status: DC

## 2018-01-29 MED ORDER — ASPIRIN 325 MG PO TABS
650.0000 mg | ORAL_TABLET | Freq: Once | ORAL | Status: AC
  Administered 2018-01-29: 650 mg via ORAL
  Filled 2018-01-29: qty 2

## 2018-01-29 NOTE — ED Notes (Signed)
Patient transported to Ultrasound 

## 2018-01-29 NOTE — Progress Notes (Signed)
Pt stated she was unable to chew on her right side. Feels slightly numb when she does. Pt still has a HA but will not take the tylenol prn. HA moves does to the right eye.

## 2018-01-29 NOTE — ED Notes (Signed)
Pt refusing oral potassium at this time.

## 2018-01-29 NOTE — Progress Notes (Signed)
*  PRELIMINARY RESULTS* Vascular Ultrasound Carotid Duplex (Doppler) has been completed.  Findings suggest 1-39% internal carotid artery stenosis bilaterally. Vertebral arteries are patent with antegrade flow.  01/29/2018 2:46 PM Maudry Mayhew, BS, RVT, RDCS, RDMS

## 2018-01-29 NOTE — ED Notes (Signed)
PT at bedside working with pt.

## 2018-01-29 NOTE — ED Provider Notes (Addendum)
[facility] EMERGENCY DEPARTMENT Provider Note   CSN: [id number] Arrival date & time: 01/29/18  0304     History   Chief Complaint Chief Complaint  Patient presents with  . Code Stroke    HPI Nancy Dickerson is a 37 y.o. female.  HPI Patient is a 37 year old female who presents the emergency department with acute onset weakness and numbness of her right upper and right lower extremity.  This began approximately 2:30 AM this morning when she was sitting in her car at work.  She reports mild right-sided headache at this time.  She reports some paresthesias of her right face.  No prior history of stroke.  She has no known medical problems except for asthma and mild anemia.  She is never had symptoms like this before.  She does report a right-sided headache at this time.  No history of migraine headaches.  Symptoms are moderate in severity.  No neck pain.  No recent injury or trauma.  No use of anticoagulants.   Past Medical History:  Diagnosis Date  . Anemia   . Asthma   . Bursitis of hip   . Cardiomegaly   . Chondromalacia of hip   . Chondromalacia of right knee   . COPD (chronic obstructive pulmonary disease) ([place])   . GERD (gastroesophageal reflux disease)   . H/O transfusion of packed red blood cells   . Knee pain   . Tinnitus     There are no active problems to display for this patient.   History reviewed. No pertinent surgical history.   OB History   None      Home Medications    Prior to Admission medications   Medication Sig Start Date End Date Taking? Authorizing Provider  albuterol (PROVENTIL HFA;VENTOLIN HFA) 108 (90 Base) MCG/ACT inhaler Inhale 1-2 puffs into the lungs every 6 (six) hours as needed for wheezing or shortness of breath. 01/23/17   Long, Wonda Olds, MD  albuterol (PROVENTIL) (2.5 MG/3ML) 0.083% nebulizer solution Take 2.5 mg by nebulization every 6 (six) hours as needed for wheezing.    [provider]    Butalbital-APAP-Caffeine 50-300-40 MG CAPS Take 1 capsule by mouth 3 (three) times daily as needed for headache. 07/11/17   [provider]  clotrimazole (LOTRIMIN) 1 % cream Apply to affected area 2 times daily Patient taking differently: Apply 1 application topically daily as needed.  06/30/14   Kirichenko, Lahoma Rocker, PA-C  cyclobenzaprine (FLEXERIL) 10 MG tablet Take 1 tablet (10 mg total) by mouth 2 (two) times daily as needed for muscle spasms. 09/20/17   Glyn Ade, PA-C  dicyclomine (BENTYL) 20 MG tablet Take 1 tablet (20 mg total) by mouth every 12 (twelve) hours as needed (for abdominal pain/cramping). 12/25/17   Antonietta Breach, PA-C  diphenoxylate-atropine (LOMOTIL) 2.5-0.025 MG per tablet Take 1 tablet by mouth 4 (four) times daily as needed for diarrhea or loose stools. 06/30/14   Kirichenko, Tatyana, PA-C  fluticasone (FLOVENT HFA) 110 MCG/ACT inhaler Inhale 1 puff into the lungs 2 (two) times daily.    [provider]  HYDROcodone-acetaminophen (NORCO/VICODIN) 5-325 MG tablet Take 1 tablet by mouth every 4 (four) hours as needed. 09/20/17   Glyn Ade, PA-C  hydrOXYzine (VISTARIL) 25 MG capsule Take 25 mg by mouth 3 (three) times daily as needed for anxiety. 07/11/17   [provider]  ibuprofen (ADVIL,MOTRIN) 800 MG tablet Take 1 tablet (800 mg total) by mouth every 8 (eight) hours as  needed. 07/25/17   Long, Wonda Olds, MD  lidocaine (LIDODERM) 5 % Place 1 patch onto the skin daily. Remove & Discard patch within 12 hours or as directed by MD 09/20/17   Glyn Ade, PA-C  naproxen (NAPROSYN) 500 MG tablet Take 1 tablet (500 mg total) by mouth 2 (two) times daily. Patient not taking: Reported on 07/25/2017 05/10/16   Frederica Kuster, PA-C  nitrofurantoin, macrocrystal-monohydrate, (MACROBID) 100 MG capsule Take 1 capsule (100 mg total) by mouth 2 (two) times daily. 12/25/17   Antonietta Breach, PA-C  promethazine (PHENERGAN) 25 MG tablet Take 1 tablet (25  mg total) by mouth every 6 (six) hours as needed for nausea or vomiting. 12/25/17   Antonietta Breach, PA-C  Tiotropium Bromide-Olodaterol (STIOLTO RESPIMAT) 2.5-2.5 MCG/ACT AERS Inhale 2 puffs into the lungs daily.    [provider]    Family History No family history on file.  Social History Social History   Tobacco Use  . Smoking status: Never Smoker  . Smokeless tobacco: Never Used  Substance Use Topics  . Alcohol use: Yes    Comment: rare  . Drug use: Yes    Types: Marijuana     Allergies   Patient has no known allergies.   Review of Systems Review of Systems  All other systems reviewed and are negative.    Physical Exam Updated Vital Signs BP (!) 154/74 (BP Location: Left Arm)   Pulse (!) 110   Temp 98 F (36.7 C) (Oral)   Resp 16   Ht 5\' 4"  (1.626 m)   Wt 101.2 kg (223 lb)   SpO2 100%   BMI 38.28 kg/m   Physical Exam  Constitutional: She is oriented to person, place, and time. She appears well-developed and well-nourished.  HENT:  Head: Normocephalic and atraumatic.  Eyes: Pupils are equal, round, and reactive to light.  Cardiovascular: Regular rhythm.  Pulmonary/Chest: Effort normal.  Abdominal: Soft.  Musculoskeletal: Normal range of motion.  Neurological: She is alert and oriented to person, place, and time.  Speech is normal.  Face is symmetric.  Unable to lift her right arm against gravity.  Inconsistent examination of her right lower extremity.  Mild weakness of grip strength on the right.  Nursing note and vitals reviewed.    ED Treatments / Results  Labs (all labs ordered are listed, but only abnormal results are displayed) Labs Reviewed  CBC - Abnormal; Notable for the following components:      Result Value   WBC 11.8 (*)    RBC 3.72 (*)    Hemoglobin 7.1 (*)    HCT 25.6 (*)    MCV 68.8 (*)    MCH 19.1 (*)    MCHC 27.7 (*)    RDW 20.5 (*)    Platelets 545 (*)    All other components within normal limits  DIFFERENTIAL -  Abnormal; Notable for the following components:   Lymphs Abs 6.4 (*)    All other components within normal limits  COMPREHENSIVE METABOLIC PANEL - Abnormal; Notable for the following components:   Potassium 2.6 (*)    CO2 20 (*)    Glucose, Bld 163 (*)    Calcium 8.3 (*)    All other components within normal limits  I-STAT CHEM 8, ED - Abnormal; Notable for the following components:   Potassium 2.7 (*)    Glucose, Bld 167 (*)    Calcium, Ion 1.10 (*)    TCO2 21 (*)    Hemoglobin  9.2 (*)    HCT 27.0 (*)    All other components within normal limits  CBG MONITORING, ED - Abnormal; Notable for the following components:   Glucose-Capillary 130 (*)    All other components within normal limits  ETHANOL  PROTIME-INR  APTT  RAPID URINE DRUG SCREEN, HOSP PERFORMED  URINALYSIS, ROUTINE W REFLEX MICROSCOPIC  I-STAT TROPONIN, ED  I-STAT BETA HCG BLOOD, ED ([facility], [facility], [facility] ONLY)    EKG EKG Interpretation  Date/Time:  Tuesday January 29 2018 03:54:49 EDT Ventricular Rate:  99 PR Interval:    QRS Duration: 108 QT Interval:  356 QTC Calculation: 457 R Axis:   30 Text Interpretation:  Sinus rhythm Left ventricular hypertrophy Nonspecific T abnormalities, anterior leads No significant change was found Confirmed by Jola Schmidt [phone]) on 01/29/2018 5:27:22 AM   Radiology Mr Jodene Nam Head Wo Contrast  Result Date: 01/29/2018 CLINICAL DATA:  Dizziness with right-sided paresthesia and headache. EXAM: MRI HEAD WITHOUT CONTRAST MRA HEAD WITHOUT CONTRAST TECHNIQUE: Multiplanar, multiecho pulse sequences of the brain and surrounding structures were obtained without intravenous contrast. Angiographic images of the head were obtained using MRA technique without contrast. COMPARISON:  Head CT 01/29/2018 FINDINGS: MRI HEAD FINDINGS Brain: No focal diffusion restriction to indicate acute infarct. No intraparenchymal hemorrhage. The brain parenchymal signal is normal. No mass lesion. No chronic microhemorrhage or  cerebral amyloid angiopathy. No hydrocephalus, age advanced atrophy or lobar predominant volume loss. No dural abnormality or extra-axial collection. Skull and upper cervical spine: The visualized skull base, calvarium, upper cervical spine and extracranial soft tissues are normal. Sinuses/Orbits: No fluid levels or advanced mucosal thickening. No mastoid effusion. Normal orbits. MRA HEAD FINDINGS Intracranial internal carotid arteries: Normal. Anterior cerebral arteries: Normal. Middle cerebral arteries: Normal. Posterior communicating arteries: Present bilaterally. Posterior cerebral arteries: Normal. Basilar artery: Normal. Vertebral arteries: Codominant normal. Superior cerebellar arteries: Normal. Anterior inferior cerebellar arteries: Normal. Posterior inferior cerebellar arteries: Normal. IMPRESSION: Normal MRI/MRA of the brain. Electronically Signed   By: Ulyses Jarred M.D.   On: 01/29/2018 05:59   Mr Brain Wo Contrast  Result Date: 01/29/2018 CLINICAL DATA:  Dizziness with right-sided paresthesia and headache. EXAM: MRI HEAD WITHOUT CONTRAST MRA HEAD WITHOUT CONTRAST TECHNIQUE: Multiplanar, multiecho pulse sequences of the brain and surrounding structures were obtained without intravenous contrast. Angiographic images of the head were obtained using MRA technique without contrast. COMPARISON:  Head CT 01/29/2018 FINDINGS: MRI HEAD FINDINGS Brain: No focal diffusion restriction to indicate acute infarct. No intraparenchymal hemorrhage. The brain parenchymal signal is normal. No mass lesion. No chronic microhemorrhage or cerebral amyloid angiopathy. No hydrocephalus, age advanced atrophy or lobar predominant volume loss. No dural abnormality or extra-axial collection. Skull and upper cervical spine: The visualized skull base, calvarium, upper cervical spine and extracranial soft tissues are normal. Sinuses/Orbits: No fluid levels or advanced mucosal thickening. No mastoid effusion. Normal orbits. MRA HEAD  FINDINGS Intracranial internal carotid arteries: Normal. Anterior cerebral arteries: Normal. Middle cerebral arteries: Normal. Posterior communicating arteries: Present bilaterally. Posterior cerebral arteries: Normal. Basilar artery: Normal. Vertebral arteries: Codominant normal. Superior cerebellar arteries: Normal. Anterior inferior cerebellar arteries: Normal. Posterior inferior cerebellar arteries: Normal. IMPRESSION: Normal MRI/MRA of the brain. Electronically Signed   By: Ulyses Jarred M.D.   On: 01/29/2018 05:59   Ct Head Code Stroke Wo Contrast  Result Date: 01/29/2018 CLINICAL DATA:  Code stroke.  Right-sided weakness and headache EXAM: CT HEAD WITHOUT CONTRAST TECHNIQUE: Contiguous axial images were obtained from the base of the skull through the  vertex without intravenous contrast. COMPARISON:  Head CT 01/07/2018 FINDINGS: Brain: No mass lesion or acute hemorrhage. No focal hypoattenuation of the basal ganglia or cortex to indicate infarcted tissue. No hydrocephalus or age advanced atrophy. Vascular: No hyperdense vessel. No advanced atherosclerotic calcification of the arteries at the skull base. Skull: Normal visualized skull base, calvarium and extracranial soft tissues. Sinuses/Orbits: No sinus fluid levels or advanced mucosal thickening. No mastoid effusion. Normal orbits. ASPECTS [facility] Stroke Program Early CT Score) - Ganglionic level infarction (caudate, lentiform nuclei, internal capsule, insula, M1-M3 cortex): 7 - Supraganglionic infarction (M4-M6 cortex): 3 Total score (0-10 with 10 being normal): 10 IMPRESSION: 1. Normal head CT. 2. ASPECTS is 10. These results were communicated to Dr. Kerney Elbe at 3:39 am on 01/29/2018 by text page via the [facility] messaging system. Electronically Signed   By: Ulyses Jarred M.D.   On: 01/29/2018 03:40    Procedures .Critical Care Performed by: Jola Schmidt, MD Authorized by: Jola Schmidt, MD     CRITICAL CARE Performed by: Jola Schmidt Total critical care time: 33 minutes Critical care time was exclusive of separately billable procedures and treating other patients. Critical care was necessary to treat or prevent imminent or life-threatening deterioration. Critical care was time spent personally by me on the following activities: development of treatment plan with patient and/or surrogate as well as nursing, discussions with consultants, evaluation of patient's response to treatment, examination of patient, obtaining history from patient or surrogate, ordering and performing treatments and interventions, ordering and review of laboratory studies, ordering and review of radiographic studies, pulse oximetry and re-evaluation of patient's condition.   Medications Ordered in ED Medications  sodium chloride 0.9 % bolus 500 mL (0 mLs Intravenous Stopped 01/29/18 0435)    Followed by  0.9 %  sodium chloride infusion (100 mL/hr Intravenous New Bag/Given 01/29/18 0435)  potassium chloride 10 mEq in 100 mL IVPB (has no administration in time range)  promethazine (PHENERGAN) injection 25 mg (25 mg Intravenous Given 01/29/18 0416)  aspirin tablet 650 mg (650 mg Oral Given 01/29/18 0413)  prochlorperazine (COMPAZINE) injection 10 mg (10 mg Intravenous Given 01/29/18 0435)     Initial Impression / Assessment and Plan / ED Course  I have reviewed the triage vital signs and the nursing notes.  Pertinent labs & imaging results that were available during my care of the patient were reviewed by me and considered in my medical decision making (see chart for details).     There are some slight inconsistencies in her examination however given the patient's acute onset symptoms she will be called a code stroke at this time for rapid evaluation by neurology and rapid head CT.  We will continue to monitor the patient closely while in the emergency department.  TPA offered by neurology but patient refuses after risk and benefit discussion  had  I personally reviewed the patient's noncontrasted head CT which demonstrates no acute intracranial abnormality  Differential diagnosis: Stroke versus atypical migraine/complicated migraine.  5:28 AM Patient will be admitted the hospital.  She is pending MRI at this time.  She has profound hypokalemia which will need IV replacement.  This also could be leading to some of her paresthesias and weakness.  Chronic anemia for the patient.  She will undergo anemia panel at this time.  Patient updated.  Consultations: Neurology-Dr. Cheral Marker [facility] hospitalist  Dispel: Admit  Final Clinical Impressions(s) / ED Diagnoses   Final diagnoses:  Acute right-sided weakness  Hypokalemia  Chronic anemia  ED Discharge Orders    None       Jola Schmidt, MD 01/29/18 9021    Jola Schmidt, MD 02/12/18 1051

## 2018-01-29 NOTE — Plan of Care (Signed)
Discussed with Dr. Venora Maples. Nancy Dickerson is a 37 year old female with pmh COPD/asthma, anemia, and cardiomegaly; who presents with acute onset of right-sided weakness.  Neurology consulted recommending completion of stroke workup.  MRI/MRA of the brain performed and pending read.  Labs revealed hemoglobin 7.1(baseline appears to be 7-8) and potassium 2.7.  Patient ordered 3 rounds of potassium chloride IV and 40 mEq p.o.  Patient has been typed and screened for possible need of blood products.  Accepted to a telemetry bed.

## 2018-01-29 NOTE — Code Documentation (Signed)
Responded to Code Stroke called at 0320.  Pt with sudden onset R sided weakness while in car at work.  LSN-[id number], NIH-5 for R sided weakness and sensory loss. BP-154/74. CBG-167. CT head without acute findings.  Plan for MRI.

## 2018-01-29 NOTE — ED Notes (Signed)
Patient transported to MRI 

## 2018-01-29 NOTE — Evaluation (Signed)
Physical Therapy Evaluation Patient Details Name: Nancy Dickerson MRN: [id number] DOB: 02-15-1981 Today's Date: 01/29/2018   History of Present Illness  Pt is a 37 y/o female admitted secondary to R sided weakness and sensation deficits. MRI/MRA and CT normal. Was found to have hypokalemia. PMH includes anemia, chondromalacia of hip and R knee, and COPD.    Clinical Impression  Pt admitted secondary to problem above with deficits below. Pt with mild unsteadiness during gait requiring min guard for mobility, however, otherwise tolerated ambulation well. Feel pt will progress well and will not need any follow up PT. Reports boyfriend can help as needed upon d/c. Will continue to follow acutely to maximize functional mobility independence and safety.     Follow Up Recommendations No PT follow up    Equipment Recommendations  None recommended by PT    Recommendations for Other Services       Precautions / Restrictions Precautions Precautions: Fall Restrictions Weight Bearing Restrictions: No      Mobility  Bed Mobility Overal bed mobility: Modified Independent             General bed mobility comments: Increased time, however, no assist required.   Transfers Overall transfer level: Needs assistance Equipment used: None Transfers: Sit to/from Stand Sit to Stand: Min guard         General transfer comment: Min guard for safety.   Ambulation/Gait Ambulation/Gait assistance: Min guard Ambulation Distance (Feet): 100 Feet Assistive device: None Gait Pattern/deviations: Step-through pattern Gait velocity: Decreased  Gait velocity interpretation: <1.8 ft/sec, indicate of risk for recurrent falls General Gait Details: Slow, mildly unsteady gait. Required min guard for steadying assist. No overt LOB noted.   Stairs Stairs: Yes       General stair comments: Verbally reviewed safe stair navigation at home and assist level recommendations. Educated about use of rails and  step to technique. Also reviewed appropriate LE sequencing.   Wheelchair Mobility    Modified Rankin (Stroke Patients Only)       Balance Overall balance assessment: Needs assistance Sitting-balance support: No upper extremity supported;Feet supported Sitting balance-Leahy Scale: Good     Standing balance support: No upper extremity supported;During functional activity Standing balance-Leahy Scale: Fair                               Pertinent Vitals/Pain Pain Assessment: No/denies pain    Home Living Family/patient expects to be discharged to:: Private residence Living Arrangements: Spouse/significant other Available Help at Discharge: Family;Available 24 hours/day Type of Home: House Home Access: Stairs to enter Entrance Stairs-Rails: Right;Left;Can reach both Entrance Stairs-Number of Steps: 3 Home Layout: One level Home Equipment: None      Prior Function Level of Independence: Independent               Hand Dominance        Extremity/Trunk Assessment   Upper Extremity Assessment Upper Extremity Assessment: Defer to OT evaluation    Lower Extremity Assessment Lower Extremity Assessment: RLE deficits/detail RLE Deficits / Details: Reports decreased sensation. Able to perform ankle pump and heel slide without difficulty.  RLE Sensation: decreased light touch    Cervical / Trunk Assessment Cervical / Trunk Assessment: Normal  Communication   Communication: No difficulties  Cognition Arousal/Alertness: Awake/alert Behavior During Therapy: WFL for tasks assessed/performed Overall Cognitive Status: Within Functional Limits for tasks assessed  General Comments General comments (skin integrity, edema, etc.): Pt's boyfriend present during session.     Exercises     Assessment/Plan    PT Assessment Patient needs continued PT services  PT Problem List Impaired sensation;Decreased  balance;Decreased mobility;Decreased knowledge of precautions       PT Treatment Interventions Gait training;Stair training;Functional mobility training;Therapeutic activities;Therapeutic exercise;Balance training;Patient/family education    PT Goals (Current goals can be found in the Care Plan section)  Acute Rehab PT Goals Patient Stated Goal: to go home  PT Goal Formulation: With patient Time For Goal Achievement: 02/12/18 Potential to Achieve Goals: Good    Frequency Min 3X/week   Barriers to discharge        Co-evaluation               AM-PAC PT "6 Clicks" Daily Activity  Outcome Measure Difficulty turning over in bed (including adjusting bedclothes, sheets and blankets)?: None Difficulty moving from lying on back to sitting on the side of the bed? : A Little Difficulty sitting down on and standing up from a chair with arms (e.g., wheelchair, bedside commode, etc,.)?: Unable Help needed moving to and from a bed to chair (including a wheelchair)?: A Little Help needed walking in hospital room?: A Little Help needed climbing 3-5 steps with a railing? : A Little 6 Click Score: 17    End of Session Equipment Utilized During Treatment: Gait belt Activity Tolerance: Patient tolerated treatment well Patient left: in bed;with call bell/phone within reach Nurse Communication: Mobility status PT Visit Diagnosis: Other abnormalities of gait and mobility (R26.89);Unsteadiness on feet (R26.81)    Time: 1275-1700 PT Time Calculation (min) (ACUTE ONLY): 17 min   Charges:   PT Evaluation $PT Eval Low Complexity: 1 Low     PT G Codes:        Leighton Ruff, PT, DPT  Acute Rehabilitation Services  Pager: [phone]   Rudean Hitt 01/29/2018, 12:15 PM

## 2018-01-29 NOTE — H&P (Addendum)
History and Physical    Nancy Dickerson OEV:[id number] DOB: December 19, 1980 DOA: 01/29/2018  PCP: System, Pcp Not In Patient coming from: Home  I have personally briefly reviewed patient's old medical records in [place]  Chief Complaint: Right-sided weakness  HPI: Nancy Dickerson is a 37 y.o. female with medical history significant of anemia, GERD, presented with complaints of right-sided weakness.  Apparently symptoms started this morning at approximately 2 AM.  History is limited since patient is very somnolent and difficult to wake up.  She barely follows commands.  It appears that she received Phenergan earlier.  She denies any chest pain or shortness of breath.  Feels that her right side is still weak, although mildly improved.  No reported fever.  There is mention of headache.  ED Course: Patient was evaluated in the emergency room her vitals are noted to be stable.  Hemoglobin was low at 7.1 which is close to her baseline.  She was noted to be hypokalemic with a potassium of 2.6.  Magnesium normal range.  Seen by neurology who recommended MRI/MRA and further stroke workup.  Patient is being admitted for further observation.  Review of Systems: Unable to assess due to patient's mental status.  Limited details provided by patient significant other as noted above in HPI  Past Medical History:  Diagnosis Date  . Anemia   . Asthma   . Bursitis of hip   . Cardiomegaly   . Chondromalacia of hip   . Chondromalacia of right knee   . COPD (chronic obstructive pulmonary disease) ([place])   . GERD (gastroesophageal reflux disease)   . H/O transfusion of packed red blood cells   . Knee pain   . Tinnitus     History reviewed. No pertinent surgical history.   reports that she has never smoked. She has never used smokeless tobacco. She reports that she drinks alcohol. She reports that she has current or past drug history. Drug: Marijuana.  No Known Allergies  Family history: Limited due  to patient's mental status.  Prior to Admission medications   Medication Sig Start Date End Date Taking? Authorizing Provider  albuterol (PROVENTIL HFA;VENTOLIN HFA) 108 (90 Base) MCG/ACT inhaler Inhale 1-2 puffs into the lungs every 6 (six) hours as needed for wheezing or shortness of breath. Patient not taking: Reported on 01/29/2018 01/23/17   Long, Wonda Olds, MD  clotrimazole (LOTRIMIN) 1 % cream Apply to affected area 2 times daily Patient not taking: Reported on 01/29/2018 06/30/14   Jeannett Senior, PA-C  cyclobenzaprine (FLEXERIL) 10 MG tablet Take 1 tablet (10 mg total) by mouth 2 (two) times daily as needed for muscle spasms. Patient not taking: Reported on 01/29/2018 09/20/17   Glyn Ade, PA-C  dicyclomine (BENTYL) 20 MG tablet Take 1 tablet (20 mg total) by mouth every 12 (twelve) hours as needed (for abdominal pain/cramping). Patient not taking: Reported on 01/29/2018 12/25/17   Antonietta Breach, PA-C  diphenoxylate-atropine (LOMOTIL) 2.5-0.025 MG per tablet Take 1 tablet by mouth 4 (four) times daily as needed for diarrhea or loose stools. Patient not taking: Reported on 01/29/2018 06/30/14   Jeannett Senior, PA-C  HYDROcodone-acetaminophen (NORCO/VICODIN) 5-325 MG tablet Take 1 tablet by mouth every 4 (four) hours as needed. Patient not taking: Reported on 01/29/2018 09/20/17   Glyn Ade, PA-C  ibuprofen (ADVIL,MOTRIN) 800 MG tablet Take 1 tablet (800 mg total) by mouth every 8 (eight) hours as needed. Patient not taking: Reported on 01/29/2018 07/25/17   Long,  Wonda Olds, MD  lidocaine (LIDODERM) 5 % Place 1 patch onto the skin daily. Remove & Discard patch within 12 hours or as directed by MD Patient not taking: Reported on 01/29/2018 09/20/17   Glyn Ade, PA-C  naproxen (NAPROSYN) 500 MG tablet Take 1 tablet (500 mg total) by mouth 2 (two) times daily. Patient not taking: Reported on 07/25/2017 05/10/16   Frederica Kuster, PA-C  nitrofurantoin,  macrocrystal-monohydrate, (MACROBID) 100 MG capsule Take 1 capsule (100 mg total) by mouth 2 (two) times daily. Patient not taking: Reported on 01/29/2018 12/25/17   Antonietta Breach, PA-C  promethazine (PHENERGAN) 25 MG tablet Take 1 tablet (25 mg total) by mouth every 6 (six) hours as needed for nausea or vomiting. Patient not taking: Reported on 01/29/2018 12/25/17   Antonietta Breach, PA-C    Physical Exam: Vitals:   01/29/18 0750 01/29/18 0756 01/29/18 0815 01/29/18 0830  BP:  (!) 97/56 (!) 98/57 (!) 98/55  Pulse:  79 80 76  Resp:  20 19 19   Temp:      TempSrc:      SpO2: 100% 100% 100% 100%  Weight:      Height:        Constitutional: NAD, calm, comfortable Vitals:   01/29/18 0750 01/29/18 0756 01/29/18 0815 01/29/18 0830  BP:  (!) 97/56 (!) 98/57 (!) 98/55  Pulse:  79 80 76  Resp:  20 19 19   Temp:      TempSrc:      SpO2: 100% 100% 100% 100%  Weight:      Height:       Eyes: PERRL, lids and conjunctivae normal ENMT: Mucous membranes are moist. Posterior pharynx clear of any exudate or lesions.Normal dentition.  Neck: normal, supple, no masses, no thyromegaly Respiratory: clear to auscultation bilaterally, no wheezing, no crackles. Normal respiratory effort. No accessory muscle use.  Cardiovascular: Regular rate and rhythm, no murmurs / rubs / gallops. No extremity edema. 2+ pedal pulses. No carotid bruits.  Abdomen: no tenderness, no masses palpated. No hepatosplenomegaly. Bowel sounds positive.  Musculoskeletal: no clubbing / cyanosis. No joint deformity upper and lower extremities. Good ROM, no contractures. Normal muscle tone.  Skin: no rashes, lesions, ulcers. No induration Neurologic: Limited exam.  Poor effort.  Strength appears to be relatively equal bilaterally.  No facial asymmetry. Psychiatric: Very somnolent, difficult to keep awake   Labs on Admission: I have personally reviewed following labs and imaging studies  CBC: Recent Labs  Lab 01/29/18 0327  01/29/18 0333  WBC 11.8*  --   NEUTROABS 4.4  --   HGB 7.1* 9.2*  HCT 25.6* 27.0*  MCV 68.8*  --   PLT 545*  --    Basic Metabolic Panel: Recent Labs  Lab 01/29/18 0327 01/29/18 0333 01/29/18 0647  NA 135 139  --   K 2.6* 2.7*  --   CL 104 102  --   CO2 20*  --   --   GLUCOSE 163* 167*  --   BUN 7 8  --   CREATININE 0.72 0.70  --   CALCIUM 8.3*  --   --   MG  --   --  1.8   GFR: Estimated Creatinine Clearance: 119 mL/min (by C-G formula based on SCr of 0.7 mg/dL). Liver Function Tests: Recent Labs  Lab 01/29/18 0327  AST 19  ALT 16  ALKPHOS 55  BILITOT 0.3  PROT 7.0  ALBUMIN 3.6   No results for input(s): LIPASE, AMYLASE  in the last 168 hours. No results for input(s): AMMONIA in the last 168 hours. Coagulation Profile: Recent Labs  Lab 01/29/18 0327  INR 1.09   Cardiac Enzymes: No results for input(s): CKTOTAL, CKMB, CKMBINDEX, TROPONINI in the last 168 hours. BNP (last 3 results) No results for input(s): PROBNP in the last 8760 hours. HbA1C: No results for input(s): HGBA1C in the last 72 hours. CBG: Recent Labs  Lab 01/29/18 0356  GLUCAP 130*   Lipid Profile: No results for input(s): CHOL, HDL, LDLCALC, TRIG, CHOLHDL, LDLDIRECT in the last 72 hours. Thyroid Function Tests: No results for input(s): TSH, T4TOTAL, FREET4, T3FREE, THYROIDAB in the last 72 hours. Anemia Panel: Recent Labs    01/29/18 0647  VITAMINB12 312  FOLATE 12.5  FERRITIN 4*  TIBC 400  IRON 9*  RETICCTPCT 1.4   Urine analysis:    Component Value Date/Time   COLORURINE STRAW (A) 01/29/2018 0449   APPEARANCEUR HAZY (A) 01/29/2018 0449   APPEARANCEUR Cloudy 10/25/2014 1428   LABSPEC 1.009 01/29/2018 0449   LABSPEC 1.025 10/25/2014 1428   PHURINE 6.0 01/29/2018 0449   GLUCOSEU NEGATIVE 01/29/2018 0449   GLUCOSEU Negative 10/25/2014 1428   HGBUR LARGE (A) 01/29/2018 0449   BILIRUBINUR NEGATIVE 01/29/2018 0449   BILIRUBINUR Negative 10/25/2014 1428   KETONESUR NEGATIVE  01/29/2018 0449   PROTEINUR NEGATIVE 01/29/2018 0449   UROBILINOGEN 0.2 06/30/2014 0917   NITRITE NEGATIVE 01/29/2018 0449   LEUKOCYTESUR LARGE (A) 01/29/2018 0449   LEUKOCYTESUR 2+ 10/25/2014 1428    Radiological Exams on Admission: Mr Jodene Nam Head Wo Contrast  Result Date: 01/29/2018 CLINICAL DATA:  Dizziness with right-sided paresthesia and headache. EXAM: MRI HEAD WITHOUT CONTRAST MRA HEAD WITHOUT CONTRAST TECHNIQUE: Multiplanar, multiecho pulse sequences of the brain and surrounding structures were obtained without intravenous contrast. Angiographic images of the head were obtained using MRA technique without contrast. COMPARISON:  Head CT 01/29/2018 FINDINGS: MRI HEAD FINDINGS Brain: No focal diffusion restriction to indicate acute infarct. No intraparenchymal hemorrhage. The brain parenchymal signal is normal. No mass lesion. No chronic microhemorrhage or cerebral amyloid angiopathy. No hydrocephalus, age advanced atrophy or lobar predominant volume loss. No dural abnormality or extra-axial collection. Skull and upper cervical spine: The visualized skull base, calvarium, upper cervical spine and extracranial soft tissues are normal. Sinuses/Orbits: No fluid levels or advanced mucosal thickening. No mastoid effusion. Normal orbits. MRA HEAD FINDINGS Intracranial internal carotid arteries: Normal. Anterior cerebral arteries: Normal. Middle cerebral arteries: Normal. Posterior communicating arteries: Present bilaterally. Posterior cerebral arteries: Normal. Basilar artery: Normal. Vertebral arteries: Codominant normal. Superior cerebellar arteries: Normal. Anterior inferior cerebellar arteries: Normal. Posterior inferior cerebellar arteries: Normal. IMPRESSION: Normal MRI/MRA of the brain. Electronically Signed   By: Ulyses Jarred M.D.   On: 01/29/2018 05:59   Mr Brain Wo Contrast  Result Date: 01/29/2018 CLINICAL DATA:  Dizziness with right-sided paresthesia and headache. EXAM: MRI HEAD WITHOUT  CONTRAST MRA HEAD WITHOUT CONTRAST TECHNIQUE: Multiplanar, multiecho pulse sequences of the brain and surrounding structures were obtained without intravenous contrast. Angiographic images of the head were obtained using MRA technique without contrast. COMPARISON:  Head CT 01/29/2018 FINDINGS: MRI HEAD FINDINGS Brain: No focal diffusion restriction to indicate acute infarct. No intraparenchymal hemorrhage. The brain parenchymal signal is normal. No mass lesion. No chronic microhemorrhage or cerebral amyloid angiopathy. No hydrocephalus, age advanced atrophy or lobar predominant volume loss. No dural abnormality or extra-axial collection. Skull and upper cervical spine: The visualized skull base, calvarium, upper cervical spine and extracranial soft tissues are normal.  Sinuses/Orbits: No fluid levels or advanced mucosal thickening. No mastoid effusion. Normal orbits. MRA HEAD FINDINGS Intracranial internal carotid arteries: Normal. Anterior cerebral arteries: Normal. Middle cerebral arteries: Normal. Posterior communicating arteries: Present bilaterally. Posterior cerebral arteries: Normal. Basilar artery: Normal. Vertebral arteries: Codominant normal. Superior cerebellar arteries: Normal. Anterior inferior cerebellar arteries: Normal. Posterior inferior cerebellar arteries: Normal. IMPRESSION: Normal MRI/MRA of the brain. Electronically Signed   By: Ulyses Jarred M.D.   On: 01/29/2018 05:59   Ct Head Code Stroke Wo Contrast  Result Date: 01/29/2018 CLINICAL DATA:  Code stroke.  Right-sided weakness and headache EXAM: CT HEAD WITHOUT CONTRAST TECHNIQUE: Contiguous axial images were obtained from the base of the skull through the vertex without intravenous contrast. COMPARISON:  Head CT 01/07/2018 FINDINGS: Brain: No mass lesion or acute hemorrhage. No focal hypoattenuation of the basal ganglia or cortex to indicate infarcted tissue. No hydrocephalus or age advanced atrophy. Vascular: No hyperdense vessel. No  advanced atherosclerotic calcification of the arteries at the skull base. Skull: Normal visualized skull base, calvarium and extracranial soft tissues. Sinuses/Orbits: No sinus fluid levels or advanced mucosal thickening. No mastoid effusion. Normal orbits. ASPECTS [facility] Stroke Program Early CT Score) - Ganglionic level infarction (caudate, lentiform nuclei, internal capsule, insula, M1-M3 cortex): 7 - Supraganglionic infarction (M4-M6 cortex): 3 Total score (0-10 with 10 being normal): 10 IMPRESSION: 1. Normal head CT. 2. ASPECTS is 10. These results were communicated to Dr. Kerney Elbe at 3:39 am on 01/29/2018 by text page via the [facility] messaging system. Electronically Signed   By: Ulyses Jarred M.D.   On: 01/29/2018 03:40    EKG: Independently reviewed.  Sinus rhythm with no new changes when compared to prior EKGs  Assessment/Plan Active Problems:   Iron deficiency anemia due to chronic blood loss   Hypokalemia   Right sided weakness    1. Right-sided weakness with headache.  Differential includes complicated migraine, stress related symptoms and possible TIA/stroke.  MRI of the brain was noted to be negative.  Further workup in progress.  Neurology following.  Hypokalemia could also be playing a role.  Complete workup with echocardiogram and carotid Dopplers.  Replace potassium. 2. Hypokalemia.  Replace.  Magnesium normal. 3. Iron deficiency anemia.  Hemoglobin is chronically low and appears to be near baseline.  Blood pressure is marginal in the 90s.  Baseline blood pressure seems to run of the 110s-120s.  Will repeat CBC this afternoon.  If hemoglobin drops below 7, can consider transfusion of PRBC.  She does not report any other noticeable bleeding.  Suspect that her iron deficiency is related to her menstrual cycle.  Would likely benefit from chronic iron replacement therapy.  DVT prophylaxis: Lovenox Code Status: Full code Family Communication: Discussed with significant other at the  bedside Disposition Plan: Discharge home once workup is complete Consults called: Neurology Admission status: Observation, telemetry   Kathie Dike MD [facility] Pager [phone]  If 7PM-7AM, please contact night-coverage [email or web address] Password [facility]  01/29/2018, 9:05 AM   Addendum 13:00:   Informed by staff that repeat hemoglobin is down to 6.7 after receiving IV fluids.  She is not noticed any significant bleeding.  Baseline hemoglobin is in the sevens.  Will transfuse 1 unit of PRBC.  Patient is agreeable.  She will likely need to be discharged on oral iron replacement therapy.  Transfusion will also likely help her symptoms since she is complaining of generalized weakness.  [employer]

## 2018-01-29 NOTE — ED Triage Notes (Signed)
C/o funny feeling right side of her body , states she was sitting in her car at work righ side weaker.

## 2018-01-29 NOTE — ED Notes (Signed)
Dr. Roderic Palau on speaker phone speaking with pt with this RN at bedside. Pt informed of low hgb and need for blood transfusion. Risk and benefits of transfusion discussed with pt and pt verbalized understanding and agrees to accepting blood. Blood consent signed at this time. VSS. NAD

## 2018-01-29 NOTE — ED Notes (Signed)
Pt ambulated to bathroom by self without assistance.

## 2018-01-29 NOTE — ED Notes (Signed)
Venora Maples, MD made aware of potassium

## 2018-01-29 NOTE — Consult Note (Signed)
Referring Physician: Dr. Venora Maples    Chief Complaint: Right sided weakness  HPI: Nancy Dickerson is an 37 y.o. female who was recently seen in the [facility] ED on 3/31 for right sided headache with right sided pulsatile neck pain and normal CT, CTA head, and CTA neck, who presents with right sided "funny feeling" starting at 0230. This occurred while she was sitting in her car at work. Initial symptom was lightheadedness and dizziness, followed by strange sensation to the right side of her head, followed by 7/10 right sided nonthrobbing headache, then right arm odd sensation with weakness. When she tried to get out of her car, her right side felt weak. Her boyfriend then drove her to the ED.   LSN: [id number] tPA Given: No: The patient refused tPA after full risks/benefits and alternate differential diagnoses for her presentation were discussed with her.   Past Medical History:  Diagnosis Date  . Anemia   . Asthma   . Bursitis of hip   . Cardiomegaly   . Chondromalacia of hip   . Chondromalacia of right knee   . COPD (chronic obstructive pulmonary disease) ([place])   . GERD (gastroesophageal reflux disease)   . H/O transfusion of packed red blood cells   . Knee pain   . Tinnitus     History reviewed. No pertinent surgical history.  No family history on file. Social History:  reports that she has never smoked. She has never used smokeless tobacco. She reports that she drinks alcohol. She reports that she has current or past drug history. Drug: Marijuana.  Allergies: No Known Allergies  Home Medications:    ROS: As per HPI.   Physical Examination: Blood pressure (!) 154/74, pulse (!) 110, temperature 98 F (36.7 C), temperature source Oral, resp. rate 16, height 5\' 4"  (1.626 m), weight 101.2 kg (223 lb), SpO2 100 %.  HEENT: [place]/AT Lungs: Respirations unlabored Ext: No edema  Neurologic Examination: Mental Status: Alert, oriented. Dysthymic affect. Laboriously slowed, hypophonic and alternately  hoarse speech with waxes and wanes. Becomes tearful and anxious after CT. Speech fluent without evidence of aphasia.  Able to follow all commands without difficulty. Cranial Nerves: II:  Visual fields intact bilaterally; no extinction to DSS. PERRL. III,IV, VI: No ptosis. EOMI without nystagmus.  V,VII: No facial droop. Hyperesthesia to temperature right face. VIII: Hearing intact to voice IX,X: Palate rises symmetrically XI: Head at midline XII: midline tongue extension  Motor: RUE with waxing and waning strength improving with coaching. Maximum elicited is 5/5. RLE with  waxing and waning strength improving with coaching. Maximum knee extension and flexion strength elicited is 5/5. LUE and LLE: Some effort dependence, with 5/5 proximal and distal.  Slow drift of RUE which varies with separate trials and improves with coaching.  Sensory: Hyperesthesia to temp RUE and RLE. No extinction to DSS.  Deep Tendon Reflexes:  2+ bilateral brachioradialis and biceps. 2+ patellae bilaterally Cerebellar: No ataxia with FNF bilaterally. Slow and labored bilaterally, requiring good fine motor control. Gait: Deferred  Results for orders placed or performed during the hospital encounter of 01/29/18 (from the past 48 hour(s))  I-stat troponin, ED     Status: None   Collection Time: 01/29/18  3:31 AM  Result Value Ref Range   Troponin i, poc 0.00 0.00 - 0.08 ng/mL   Comment 3            Comment: Due to the release kinetics of cTnI, a negative result within the first hours  of the onset of symptoms does not rule out myocardial infarction with certainty. If myocardial infarction is still suspected, repeat the test at appropriate intervals.   I-Stat Chem 8, ED     Status: Abnormal   Collection Time: 01/29/18  3:33 AM  Result Value Ref Range   Sodium 139 135 - 145 mmol/L   Potassium 2.7 (LL) 3.5 - 5.1 mmol/L   Chloride 102 101 - 111 mmol/L   BUN 8 6 - 20 mg/dL   Creatinine, Ser 0.70 0.44 - 1.00  mg/dL   Glucose, Bld 167 (H) 65 - 99 mg/dL   Calcium, Ion 1.10 (L) 1.15 - 1.40 mmol/L   TCO2 21 (L) 22 - 32 mmol/L   Hemoglobin 9.2 (L) 12.0 - 15.0 g/dL   HCT 27.0 (L) 36.0 - 46.0 %   Comment NOTIFIED PHYSICIAN    Ct Head Code Stroke Wo Contrast  Result Date: 01/29/2018 CLINICAL DATA:  Code stroke.  Right-sided weakness and headache EXAM: CT HEAD WITHOUT CONTRAST TECHNIQUE: Contiguous axial images were obtained from the base of the skull through the vertex without intravenous contrast. COMPARISON:  Head CT 01/07/2018 FINDINGS: Brain: No mass lesion or acute hemorrhage. No focal hypoattenuation of the basal ganglia or cortex to indicate infarcted tissue. No hydrocephalus or age advanced atrophy. Vascular: No hyperdense vessel. No advanced atherosclerotic calcification of the arteries at the skull base. Skull: Normal visualized skull base, calvarium and extracranial soft tissues. Sinuses/Orbits: No sinus fluid levels or advanced mucosal thickening. No mastoid effusion. Normal orbits. ASPECTS [facility] Stroke Program Early CT Score) - Ganglionic level infarction (caudate, lentiform nuclei, internal capsule, insula, M1-M3 cortex): 7 - Supraganglionic infarction (M4-M6 cortex): 3 Total score (0-10 with 10 being normal): 10 IMPRESSION: 1. Normal head CT. 2. ASPECTS is 10. These results were communicated to Dr. Kerney Elbe at 3:39 am on 01/29/2018 by text page via the [facility] messaging system. Electronically Signed   By: Ulyses Jarred M.D.   On: 01/29/2018 03:40    Assessment: 37 y.o. female presenting with right sided weakness and hypokalemia 1. DDx for lateralized weakness on diffuse weakness with headache includes complicated migraine, stress-related symptoms and stroke. The latter is felt to be less likely given waxing/waning strength responding to coaching as well as hyper- rather than hypoesthesia on the side of the weakness. She also has no significant stroke risk factors.  2. The patient refused tPA  after full risks/benefits and alternate differential diagnoses for her presentation were discussed with her.   Plan: 1. STAT MRI brain with MRA head  2. Phenergan 25 mg IV x 1 3. PT consult, OT consult, Speech consult 4. Echocardiogram 5. Carotid dopplers 6. Prophylactic therapy- ASA 650 mg po x 1. If MRI/MRA is negative, daily therapy is not indicated.  7. Risk factor modification 8. Telemetry monitoring 9. Frequent neuro checks 10. HgbA1c, fasting lipid panel   @Electronically  signed: Dr. Kerney Elbe  01/29/2018, 3:47 AM

## 2018-01-29 NOTE — Progress Notes (Signed)
  Echocardiogram 2D Echocardiogram has been performed.  Reeves Musick L Androw 01/29/2018, 2:21 PM

## 2018-01-29 NOTE — ED Notes (Signed)
Physician paged to discuss diet

## 2018-01-30 DIAGNOSIS — F1210 Cannabis abuse, uncomplicated: Secondary | ICD-10-CM

## 2018-01-30 LAB — LIPID PANEL
Cholesterol: 115 mg/dL (ref 0–200)
HDL: 39 mg/dL — ABNORMAL LOW (ref >40)
LDL Cholesterol: 58 mg/dL (ref 0–99)
Total CHOL/HDL Ratio: 2.9 RATIO
Triglycerides: 89 mg/dL (ref <150)
VLDL: 18 mg/dL (ref 0–40)

## 2018-01-30 LAB — CBC
HCT: 26.4 % — ABNORMAL LOW (ref 36.0–46.0)
Hemoglobin: 7.8 g/dL — ABNORMAL LOW (ref 12.0–15.0)
MCH: 20.9 pg — ABNORMAL LOW (ref 26.0–34.0)
MCHC: 29.5 g/dL — ABNORMAL LOW (ref 30.0–36.0)
MCV: 70.8 fL — ABNORMAL LOW (ref 78.0–100.0)
Platelets: 435 10*3/uL — ABNORMAL HIGH (ref 150–400)
RBC: 3.73 MIL/uL — ABNORMAL LOW (ref 3.87–5.11)
RDW: 21.2 % — ABNORMAL HIGH (ref 11.5–15.5)
WBC: 7.6 10*3/uL (ref 4.0–10.5)

## 2018-01-30 LAB — BASIC METABOLIC PANEL
Anion gap: 6 (ref 5–15)
BUN: 7 mg/dL (ref 6–20)
CO2: 24 mmol/L (ref 22–32)
Calcium: 8.5 mg/dL — ABNORMAL LOW (ref 8.9–10.3)
Chloride: 108 mmol/L (ref 101–111)
Creatinine, Ser: 0.7 mg/dL (ref 0.44–1.00)
GFR calc Af Amer: >60 mL/min (ref >60)
GFR calc non Af Amer: >60 mL/min (ref >60)
Glucose, Bld: 98 mg/dL (ref 65–99)
Potassium: 3.6 mmol/L (ref 3.5–5.1)
Sodium: 138 mmol/L (ref 135–145)

## 2018-01-30 LAB — BPAM RBC
Blood Product Expiration Date: 201905172359
ISSUE DATE / TIME: 201904231455
Unit Type and Rh: 5100

## 2018-01-30 LAB — TYPE AND SCREEN
ABO/RH(D): O POS
Antibody Screen: NEGATIVE
Unit division: 0

## 2018-01-30 LAB — HEMOGLOBIN A1C
Hgb A1c MFr Bld: 5.1 % (ref 4.8–5.6)
Mean Plasma Glucose: 99.67 mg/dL

## 2018-01-30 LAB — HIV ANTIBODY (ROUTINE TESTING W REFLEX): HIV Screen 4th Generation wRfx: NONREACTIVE

## 2018-01-30 MED ORDER — FERUMOXYTOL INJECTION 510 MG/17 ML
510.0000 mg | Freq: Once | INTRAVENOUS | Status: AC
  Administered 2018-01-30: 510 mg via INTRAVENOUS
  Filled 2018-01-30: qty 17

## 2018-01-30 NOTE — Care Management Note (Signed)
Case Management Note  Patient Details  Name: Nancy Dickerson MRN: [id number] Date of Birth: 11/09/1980  Subjective/Objective:    Pt in with iron deficiency anemia. She is from home with spouse. Pt without PCP and insurance.                 Action/Plan: CM met with the patient and provided her information on the [facility]. Pt to call May 1st to get an appointment. Pt also given information on [facility] pharmacy. Pt is not d/cing on any new meds.  Pt has transportation home.   Expected Discharge Date:  01/30/18               Expected Discharge Plan:  Home/Self Care  In-House Referral:     Discharge planning Services  CM Consult, [facility]  Post Acute Care Choice:    Choice offered to:     DME Arranged:    DME Agency:     HH Arranged:    HH Agency:     Status of Service:  Completed, signed off  If discussed at [employer], dates discussed:    Additional Comments:  Pollie Friar, RN 01/30/2018, 11:26 AM

## 2018-01-30 NOTE — Evaluation (Signed)
Occupational Therapy Evaluation Patient Details Name: Nancy Dickerson MRN: [id number] DOB: 12-Sep-1981 Today's Date: 01/30/2018    History of Present Illness Pt is a 37 y/o female admitted secondary to R sided weakness and sensation deficits. MRI/MRA and CT normal. Was found to have hypokalemia. PMH includes anemia, chondromalacia of hip and R knee, and COPD.     Clinical Impression   This 37 y/o female presents with the above. At baseline pt is independent with ADLs and functional mobility. Pt completing functional mobility this session without AD and minguard-minA, slightly unsteady though no over LOB; pt  demonstrates LB ADLs with MinGuard assist, tub transfer with MinA. Pt reports she has available assist at home from daughter and boyfriend for ADLs/functional transfers PRN. Will continue to follow while pt remains in acute setting to maximize her overall safety and independence with ADLs and mobility.     Follow Up Recommendations  No OT follow up;Supervision - Intermittent    Equipment Recommendations  Tub/shower seat           Precautions / Restrictions Precautions Precautions: Fall Restrictions Weight Bearing Restrictions: No      Mobility Bed Mobility Overal bed mobility: Modified Independent             General bed mobility comments: Increased time  Transfers Overall transfer level: Modified independent Equipment used: None Transfers: Sit to/from Stand           General transfer comment: no unsteadiness noted upon standing    Balance Overall balance assessment: Needs assistance Sitting-balance support: No upper extremity supported;Feet supported Sitting balance-Leahy Scale: Good     Standing balance support: No upper extremity supported;During functional activity Standing balance-Leahy Scale: Fair                             ADL either performed or assessed with clinical judgement   ADL Overall ADL's : Needs  assistance/impaired Eating/Feeding: Modified independent;Sitting   Grooming: Min guard;Standing   Upper Body Bathing: Min guard;Sitting;Standing   Lower Body Bathing: Sit to/from stand;Min guard   Upper Body Dressing : Min guard;Sitting   Lower Body Dressing: Minimal assistance;Sit to/from stand   Toilet Transfer: Min guard;Ambulation;Regular Glass blower/designer Details (indicate cue type and reason): simulated in transfer to/from chair  Toileting- Water quality scientist and Hygiene: Min guard;Sit to/from stand   Tub/ Shower Transfer: Tub transfer;Minimal assistance;Ambulation Tub/Shower Transfer Details (indicate cue type and reason): Min HHA when stepping in/out of tub; pt reports boyfriend will be able to assist with transfers at home; discussed option of having shower seat for increased safety during task completion  Functional mobility during ADLs: Min guard General ADL Comments: pt demonstrating tub transfer and functional mobility with minguard-minA; slightly unsteady though no LOB     Vision Baseline Vision/History: No visual deficits Patient Visual Report: Other (comment)(reports intermittent blurred vision in R eye when she has a headache ) Vision Assessment?: Yes;No apparent visual deficits Eye Alignment: Within Functional Limits Ocular Range of Motion: Within Functional Limits Tracking/Visual Pursuits: Able to track stimulus in all quads without difficulty Visual Fields: No apparent deficits                Pertinent Vitals/Pain Pain Assessment: No/denies pain          Extremity/Trunk Assessment Upper Extremity Assessment Upper Extremity Assessment: RUE deficits/detail RUE Deficits / Details: increased time and effort for movements, reports decreased sensation; AROM overall WLF RUE Sensation: decreased light  touch       Cervical / Trunk Assessment Cervical / Trunk Assessment: Normal   Communication Communication Communication: No difficulties    Cognition Arousal/Alertness: Awake/alert Behavior During Therapy: WFL for tasks assessed/performed Overall Cognitive Status: Within Functional Limits for tasks assessed                                 General Comments: pt reports having to take increased time for recall of information, but is aware of current limitations    General Comments                  Home Living Family/patient expects to be discharged to:: Private residence Living Arrangements: Spouse/significant other Available Help at Discharge: Family;Available 24 hours/day Type of Home: House Home Access: Stairs to enter [employer] of Steps: 3 Entrance Stairs-Rails: Right;Left;Can reach both Home Layout: One level     Bathroom Shower/Tub: Teacher, early years/pre: Standard     Home Equipment: None          Prior Functioning/Environment Level of Independence: Independent                 OT Problem List: Decreased activity tolerance;Decreased strength;Impaired balance (sitting and/or standing)      OT Treatment/Interventions: Self-care/ADL training;DME and/or AE instruction;Therapeutic activities;Balance training;Therapeutic exercise;Visual/perceptual remediation/compensation    OT Goals(Current goals can be found in the care plan section) Acute Rehab OT Goals Patient Stated Goal: to go home  OT Goal Formulation: With patient Time For Goal Achievement: 02/13/18 Potential to Achieve Goals: Good  OT Frequency: Min 2X/week                             AM-PAC PT "6 Clicks" Daily Activity     Outcome Measure Help from another person eating meals?: None Help from another person taking care of personal grooming?: None Help from another person toileting, which includes using toliet, bedpan, or urinal?: None Help from another person bathing (including washing, rinsing, drying)?: A Little Help from another person to put on and taking off regular upper body  clothing?: None Help from another person to put on and taking off regular lower body clothing?: None 6 Click Score: 23   End of Session Equipment Utilized During Treatment: Gait belt Nurse Communication: Mobility status  Activity Tolerance: Patient tolerated treatment well Patient left: in bed;with call bell/phone within reach;with family/visitor present  OT Visit Diagnosis: Unsteadiness on feet (R26.81)                Time: 1045-1101 OT Time Calculation (min): 16 min Charges:  OT General Charges $OT Visit: 1 Visit OT Evaluation $OT Eval Low Complexity: 1 Low G-Codes:     Lou Cal, OT Pager [phone] 01/30/2018   Raymondo Band 01/30/2018, 1:10 PM

## 2018-01-30 NOTE — Discharge Summary (Signed)
Physician Discharge Summary  Nancy Dickerson:[id number] DOB: [birth year] DOA: 01/29/2018  PCP: System, Pcp Not In  Admit date: 01/29/2018 Discharge date: 01/30/2018   Recommendations for Outpatient Follow-Up:   1. PRN IV Fe infusions 2. Cbc at next office visit 3. marijuana cessation   Discharge Diagnosis:   Active Problems:   Iron deficiency anemia due to chronic blood loss   Hypokalemia   Right sided weakness   Discharge disposition:  Home.  Discharge Condition: Improved.  Diet recommendation:  Regular.  Wound care: None.   History of Present Illness:   Nancy Dickerson is a 37 y.o. female with medical history significant of anemia, GERD, presented with complaints of right-sided weakness.  Apparently symptoms started this morning at approximately 2 AM.  History is limited since patient is very somnolent and difficult to wake up.  She barely follows commands.  It appears that she received Phenergan earlier.  She denies any chest pain or shortness of breath.  Feels that her right side is still weak, although mildly improved.  No reported fever.  There is mention of headache.     Hospital Course by Problem:   Right-sided weakness with headache after smoking marijuana.   -MRI of the brain was noted to be negative.   -echo/cardotid unremarkable -seen by neuro-- no need for ASA -instructed to keep a headache diary, stop using drugs and follow up with neuro (said she already had appointment)  Hypokalemia.  Replaced.  Magnesium normal.  Iron deficiency anemia.  Hemoglobin is chronically low and appears to be near baseline.  -IV fe -used to get in the past but none for last 1 1/2 years -says she is unable to take Po due to absoprtion issues      Medical Consultants:    Neuro   Discharge Exam:   Vitals:   01/30/18 0340 01/30/18 0752  BP: 104/74 132/83  Pulse: 66 74  Resp: 18 17  Temp: 97.9 F (36.6 C) 98 F (36.7 C)  SpO2: 99% 100%   Vitals:   01/29/18 2000 01/29/18 2352 01/30/18 0340 01/30/18 0752  BP: 128/72 111/67 104/74 132/83  Pulse: 75 87 66 74  Resp: 20 20 18 17   Temp: 98 F (36.7 C) 98.9 F (37.2 C) 97.9 F (36.6 C) 98 F (36.7 C)  TempSrc: Oral Oral Oral Oral  SpO2: 99% 100% 99% 100%  Weight:      Height:        Gen:  NAD   The results of significant diagnostics from this hospitalization (including imaging, microbiology, ancillary and laboratory) are listed below for reference.     Procedures and Diagnostic Studies:   Mr Virgel Paling [facility] Contrast  Result Date: 01/29/2018 CLINICAL DATA:  Dizziness with right-sided paresthesia and headache. EXAM: MRI HEAD WITHOUT CONTRAST MRA HEAD WITHOUT CONTRAST TECHNIQUE: Multiplanar, multiecho pulse sequences of the brain and surrounding structures were obtained without intravenous contrast. Angiographic images of the head were obtained using MRA technique without contrast. COMPARISON:  Head CT 01/29/2018 FINDINGS: MRI HEAD FINDINGS Brain: No focal diffusion restriction to indicate acute infarct. No intraparenchymal hemorrhage. The brain parenchymal signal is normal. No mass lesion. No chronic microhemorrhage or cerebral amyloid angiopathy. No hydrocephalus, age advanced atrophy or lobar predominant volume loss. No dural abnormality or extra-axial collection. Skull and upper cervical spine: The visualized skull base, calvarium, upper cervical spine and extracranial soft tissues are normal. Sinuses/Orbits: No fluid levels or advanced mucosal thickening. No mastoid effusion. Normal orbits. MRA HEAD  FINDINGS Intracranial internal carotid arteries: Normal. Anterior cerebral arteries: Normal. Middle cerebral arteries: Normal. Posterior communicating arteries: Present bilaterally. Posterior cerebral arteries: Normal. Basilar artery: Normal. Vertebral arteries: Codominant normal. Superior cerebellar arteries: Normal. Anterior inferior cerebellar arteries: Normal. Posterior inferior cerebellar  arteries: Normal. IMPRESSION: Normal MRI/MRA of the brain. Electronically Signed   By: Ulyses Jarred M.D.   On: 01/29/2018 05:59   Mr Brain Wo Contrast  Result Date: 01/29/2018 CLINICAL DATA:  Dizziness with right-sided paresthesia and headache. EXAM: MRI HEAD WITHOUT CONTRAST MRA HEAD WITHOUT CONTRAST TECHNIQUE: Multiplanar, multiecho pulse sequences of the brain and surrounding structures were obtained without intravenous contrast. Angiographic images of the head were obtained using MRA technique without contrast. COMPARISON:  Head CT 01/29/2018 FINDINGS: MRI HEAD FINDINGS Brain: No focal diffusion restriction to indicate acute infarct. No intraparenchymal hemorrhage. The brain parenchymal signal is normal. No mass lesion. No chronic microhemorrhage or cerebral amyloid angiopathy. No hydrocephalus, age advanced atrophy or lobar predominant volume loss. No dural abnormality or extra-axial collection. Skull and upper cervical spine: The visualized skull base, calvarium, upper cervical spine and extracranial soft tissues are normal. Sinuses/Orbits: No fluid levels or advanced mucosal thickening. No mastoid effusion. Normal orbits. MRA HEAD FINDINGS Intracranial internal carotid arteries: Normal. Anterior cerebral arteries: Normal. Middle cerebral arteries: Normal. Posterior communicating arteries: Present bilaterally. Posterior cerebral arteries: Normal. Basilar artery: Normal. Vertebral arteries: Codominant normal. Superior cerebellar arteries: Normal. Anterior inferior cerebellar arteries: Normal. Posterior inferior cerebellar arteries: Normal. IMPRESSION: Normal MRI/MRA of the brain. Electronically Signed   By: Ulyses Jarred M.D.   On: 01/29/2018 05:59   Ct Head Code Stroke Wo Contrast  Result Date: 01/29/2018 CLINICAL DATA:  Code stroke.  Right-sided weakness and headache EXAM: CT HEAD WITHOUT CONTRAST TECHNIQUE: Contiguous axial images were obtained from the base of the skull through the vertex without  intravenous contrast. COMPARISON:  Head CT 01/07/2018 FINDINGS: Brain: No mass lesion or acute hemorrhage. No focal hypoattenuation of the basal ganglia or cortex to indicate infarcted tissue. No hydrocephalus or age advanced atrophy. Vascular: No hyperdense vessel. No advanced atherosclerotic calcification of the arteries at the skull base. Skull: Normal visualized skull base, calvarium and extracranial soft tissues. Sinuses/Orbits: No sinus fluid levels or advanced mucosal thickening. No mastoid effusion. Normal orbits. ASPECTS [facility] Stroke Program Early CT Score) - Ganglionic level infarction (caudate, lentiform nuclei, internal capsule, insula, M1-M3 cortex): 7 - Supraganglionic infarction (M4-M6 cortex): 3 Total score (0-10 with 10 being normal): 10 IMPRESSION: 1. Normal head CT. 2. ASPECTS is 10. These results were communicated to Dr. Kerney Elbe at 3:39 am on 01/29/2018 by text page via the [facility] messaging system. Electronically Signed   By: Ulyses Jarred M.D.   On: 01/29/2018 03:40     Labs:   Basic Metabolic Panel: Recent Labs  Lab 01/29/18 0327 01/29/18 0333 01/29/18 0647 01/29/18 1108 01/29/18 1114 01/30/18 0355  NA 135 139  --   --   --  138  K 2.6* 2.7*  --   --  4.3 3.6  CL 104 102  --   --   --  108  CO2 20*  --   --   --   --  24  GLUCOSE 163* 167*  --   --   --  98  BUN 7 8  --   --   --  7  CREATININE 0.72 0.70  --  0.64  --  0.70  CALCIUM 8.3*  --   --   --   --  8.5*  MG  --   --  1.8  --   --   --    GFR Estimated Creatinine Clearance: 119 mL/min (by C-G formula based on SCr of 0.7 mg/dL). Liver Function Tests: Recent Labs  Lab 01/29/18 0327  AST 19  ALT 16  ALKPHOS 55  BILITOT 0.3  PROT 7.0  ALBUMIN 3.6   No results for input(s): LIPASE, AMYLASE in the last 168 hours. No results for input(s): AMMONIA in the last 168 hours. Coagulation profile Recent Labs  Lab 01/29/18 0327  INR 1.09    CBC: Recent Labs  Lab 01/29/18 0327 01/29/18 0333  01/29/18 1108 01/30/18 0355  WBC 11.8*  --  7.4 7.6  NEUTROABS 4.4  --   --   --   HGB 7.1* 9.2* 6.7* 7.8*  HCT 25.6* 27.0* 24.3* 26.4*  MCV 68.8*  --  69.0* 70.8*  PLT 545*  --  431* 435*   Cardiac Enzymes: No results for input(s): CKTOTAL, CKMB, CKMBINDEX, TROPONINI in the last 168 hours. BNP: Invalid input(s): POCBNP CBG: Recent Labs  Lab 01/29/18 0356  GLUCAP 130*   D-Dimer No results for input(s): DDIMER in the last 72 hours. Hgb A1c Recent Labs    01/30/18 0355  HGBA1C 5.1   Lipid Profile Recent Labs    01/30/18 0355  CHOL 115  HDL 39*  LDLCALC 58  TRIG 89  CHOLHDL 2.9   Thyroid function studies No results for input(s): TSH, T4TOTAL, T3FREE, THYROIDAB in the last 72 hours.  Invalid input(s): FREET3 Anemia work up Recent Labs    01/29/18 0647  VITAMINB12 312  FOLATE 12.5  FERRITIN 4*  TIBC 400  IRON 9*  RETICCTPCT 1.4   Microbiology No results found for this or any previous visit (from the past 240 hour(s)).   Discharge Instructions:   Discharge Instructions    Diet general   Complete by:  As directed    Discharge instructions   Complete by:  As directed    Headache diary Stop marijuana   Increase activity slowly   Complete by:  As directed      Allergies as of 01/30/2018   No Known Allergies     Medication List    STOP taking these medications   albuterol 108 (90 Base) MCG/ACT inhaler Commonly known as:  PROVENTIL HFA;VENTOLIN HFA   clotrimazole 1 % cream Commonly known as:  LOTRIMIN   cyclobenzaprine 10 MG tablet Commonly known as:  FLEXERIL   dicyclomine 20 MG tablet Commonly known as:  BENTYL   diphenoxylate-atropine 2.5-0.025 MG tablet Commonly known as:  LOMOTIL   HYDROcodone-acetaminophen 5-325 MG tablet Commonly known as:  NORCO/VICODIN   ibuprofen 800 MG tablet Commonly known as:  ADVIL,MOTRIN   lidocaine 5 % Commonly known as:  LIDODERM   naproxen 500 MG tablet Commonly known as:  NAPROSYN     nitrofurantoin (macrocrystal-monohydrate) 100 MG capsule Commonly known as:  MACROBID   promethazine 25 MG tablet Commonly known as:  PHENERGAN         Time coordinating discharge: 35 min  Signed:  Geradine Girt   [facility] 01/30/2018, 10:30 AM

## 2018-01-30 NOTE — Progress Notes (Signed)
Pt being discharged home via wheelchair with family. Pt alert and oriented x4. VSS. Pt c/o no pain at this time. No signs of respiratory distress. Education complete and care plans resolved. IV removed with catheter intact and pt tolerated well. No further issues at this time. Pt to follow up with PCP. Tumeka Chimenti R, RN 

## 2018-01-30 NOTE — Progress Notes (Signed)
Physical Therapy Treatment Patient Details Name: Nancy Dickerson MRN: [id number] DOB: 04/06/1981 Today's Date: 01/30/2018    History of Present Illness Pt is a 37 y/o female admitted secondary to R sided weakness and sensation deficits. MRI/MRA and CT normal. Was found to have hypokalemia. PMH includes anemia, chondromalacia of hip and R knee, and COPD.      PT Comments    Patient seen for gait and stair training and pt tolerated session well. Pt reports she is ready to go home. Current plan remains appropriate.    Follow Up Recommendations  No PT follow up     Equipment Recommendations  None recommended by PT    Recommendations for Other Services       Precautions / Restrictions Precautions Precautions: Fall Restrictions Weight Bearing Restrictions: No    Mobility  Bed Mobility Overal bed mobility: Modified Independent             General bed mobility comments: Increased time  Transfers Overall transfer level: Modified independent Equipment used: None Transfers: Sit to/from Stand           General transfer comment: no unsteadiness noted upon standing  Ambulation/Gait Ambulation/Gait assistance: Supervision Ambulation Distance (Feet): 120 Feet Assistive device: None;Straight cane Gait Pattern/deviations: Step-through pattern;Decreased stride length;Wide base of support Gait velocity: Decreased    General Gait Details: use of SPC initially but pt able to ambulate without AD; cues for increased stride length; lateral sway but no LOB and no significant gait deviations with horizontal heads turns and directional changes   Stairs Stairs: Yes Stairs assistance: Modified independent (Device/Increase time) Stair Management: Two rails;Step to pattern Number of Stairs: 3 General stair comments: cues for sequencing   Wheelchair Mobility    Modified Rankin (Stroke Patients Only)       Balance Overall balance assessment: Needs assistance Sitting-balance  support: No upper extremity supported;Feet supported Sitting balance-Leahy Scale: Good     Standing balance support: No upper extremity supported;During functional activity Standing balance-Leahy Scale: Fair                              Cognition Arousal/Alertness: Awake/alert Behavior During Therapy: WFL for tasks assessed/performed Overall Cognitive Status: Within Functional Limits for tasks assessed                                        Exercises      General Comments        Pertinent Vitals/Pain Pain Assessment: No/denies pain    Home Living                      Prior Function            PT Goals (current goals can now be found in the care plan section) Acute Rehab PT Goals Patient Stated Goal: to go home  PT Goal Formulation: With patient Time For Goal Achievement: 02/12/18 Potential to Achieve Goals: Good Progress towards PT goals: Progressing toward goals    Frequency    Min 3X/week      PT Plan Current plan remains appropriate    Co-evaluation              AM-PAC PT "6 Clicks" Daily Activity  Outcome Measure  Difficulty turning over in bed (including adjusting bedclothes, sheets and blankets)?: None Difficulty moving from  lying on back to sitting on the side of the bed? : A Little Difficulty sitting down on and standing up from a chair with arms (e.g., wheelchair, bedside commode, etc,.)?: A Little Help needed moving to and from a bed to chair (including a wheelchair)?: A Little Help needed walking in hospital room?: A Little Help needed climbing 3-5 steps with a railing? : A Little 6 Click Score: 19    End of Session Equipment Utilized During Treatment: Gait belt Activity Tolerance: Patient tolerated treatment well Patient left: Other (comment)(pt in gym with OT end of session) Nurse Communication: Mobility status PT Visit Diagnosis: Other abnormalities of gait and mobility (R26.89);Unsteadiness on  feet (R26.81)     Time: 9678-9381 PT Time Calculation (min) (ACUTE ONLY): 10 min  Charges:  $Gait Training: 8-22 mins                    G Codes:       Earney Navy, PTA Pager: [phone]     Darliss Cheney 01/30/2018, 11:02 AM

## 2018-01-30 NOTE — Evaluation (Signed)
Speech Language Pathology Evaluation Patient Details Name: Nancy Dickerson MRN: [id number] DOB: March 20, 1981 Today's Date: 01/30/2018 Time: 0347-4259 SLP Time Calculation (min) (ACUTE ONLY): 13 min  Problem List:  Patient Active Problem List   Diagnosis Date Noted  . Iron deficiency anemia due to chronic blood loss 01/29/2018  . Hypokalemia 01/29/2018  . Right sided weakness 01/29/2018   Past Medical History:  Past Medical History:  Diagnosis Date  . Anemia   . Asthma   . Bursitis of hip   . Cardiomegaly   . Chondromalacia of hip   . Chondromalacia of right knee   . COPD (chronic obstructive pulmonary disease) ([place])   . GERD (gastroesophageal reflux disease)   . H/O transfusion of packed red blood cells   . Knee pain   . Tinnitus    Past Surgical History: History reviewed. No pertinent surgical history. HPI:  Pt is a 37 y/o female admitted secondary to R sided weakness and sensation deficits. MRI/MRA and CT normal. Was found to have hypokalemia. PMH includes anemia, chondromalacia of hip and R knee, and COPD.   Assessment / Plan / Recommendation Clinical Impression  Cognistat complete. Patient scoring WNL on all areas assessed. No SLP f/u indicated.     SLP Assessment  SLP Recommendation/Assessment: Patient does not need any further Speech Lanaguage Pathology Services SLP Visit Diagnosis: Cognitive communication deficit (R41.841)    Follow Up Recommendations  None          SLP Evaluation Cognition  Overall Cognitive Status: Within Functional Limits for tasks assessed       Comprehension  Auditory Comprehension Overall Auditory Comprehension: Appears within functional limits for tasks assessed Visual Recognition/Discrimination Discrimination: Within Function Limits Reading Comprehension Reading Status: Within funtional limits    Expression Expression Primary Mode of Expression: Verbal Verbal Expression Overall Verbal Expression: Appears within functional  limits for tasks assessed   Oral / Motor  Oral Motor/Sensory Function Overall Oral Motor/Sensory Function: Within functional limits(except mild right sided facial numbness per patient) Motor Speech Overall Motor Speech: Appears within functional limits for tasks assessed   GO                   Gabriel Rainwater MA, CCC-SLP [phone]   [name] Meryl 01/30/2018, 8:59 AM

## 2018-02-07 ENCOUNTER — Ambulatory Visit: Payer: Medicaid Other | Attending: Family Medicine | Admitting: Physician Assistant

## 2018-02-07 ENCOUNTER — Other Ambulatory Visit: Payer: Self-pay

## 2018-02-07 ENCOUNTER — Encounter: Payer: Self-pay | Admitting: Physician Assistant

## 2018-02-07 VITALS — BP 133/89 | HR 95 | Resp 18 | Ht 64.0 in | Wt 240.2 lb

## 2018-02-07 DIAGNOSIS — R531 Weakness: Secondary | ICD-10-CM

## 2018-02-07 DIAGNOSIS — R519 Headache, unspecified: Secondary | ICD-10-CM

## 2018-02-07 DIAGNOSIS — E876 Hypokalemia: Secondary | ICD-10-CM | POA: Insufficient documentation

## 2018-02-07 DIAGNOSIS — R51 Headache: Secondary | ICD-10-CM | POA: Diagnosis not present

## 2018-02-07 DIAGNOSIS — K219 Gastro-esophageal reflux disease without esophagitis: Secondary | ICD-10-CM | POA: Insufficient documentation

## 2018-02-07 DIAGNOSIS — D509 Iron deficiency anemia, unspecified: Secondary | ICD-10-CM | POA: Diagnosis not present

## 2018-02-07 DIAGNOSIS — J449 Chronic obstructive pulmonary disease, unspecified: Secondary | ICD-10-CM | POA: Insufficient documentation

## 2018-02-07 DIAGNOSIS — G8929 Other chronic pain: Secondary | ICD-10-CM

## 2018-02-07 NOTE — Progress Notes (Signed)
Nancy Dickerson  NOB:[id number]  HUT:[id number]  DOB - 22-Jan-1981  Chief Complaint  Patient presents with  . Hospitalization Follow-up       Subjective:   Nancy Dickerson is a 37 y.o. female here today for establishment of care. She was hospitalized 01/29/18-01/30/18 for right sided weakness and HAs. Code stroke protocol was followed. Her hemoglobin was 7.1 and later 6.9. At baseline he hangs around 8. She knows of herself to have iron deficiency anemia treated in the past in [place] and [place] with IV iron infusions. She has a history of intolerance of oral iron. Her potassium was 2.6. CT scan of the head and MRI of the head showed no acute process. Her echo and carotid Dopplers were within normal limits. She was transfused 1 unit of blood. Vital signs remained stable during her stay, afebrile. She was given physical and occupational therapy. No medications were given at the time of discharge.  She continues to have right lower extremity weakness. Her upper extremity is doing okay. She continues with intermittent headaches. She's tired all the time. She snores loudly. Within the last couple years she had a negative sleep study. She feels that she stutters since the incident. She has not gone back to work.    ROS: GEN: denies fever or chills, denies change in weight Skin: denies lesions or rashes HEENT: denies headache, earache, epistaxis, sore throat, or neck pain LUNGS: denies SHOB, dyspnea, PND, orthopnea CV: denies CP or palpitations ABD: denies abd pain, N or V EXT: denies muscle spasms or swelling; no pain in lower ext, no weakness NEURO: denies numbness or tingling, denies sz, stroke or TIA  ALLERGIES: No Known Allergies  PAST MEDICAL HISTORY: Past Medical History:  Diagnosis Date  . Anemia   . Asthma   . Bursitis of hip   . Cardiomegaly   . Chondromalacia of hip   . Chondromalacia of right knee   . COPD (chronic obstructive pulmonary disease) ([place])   . GERD  (gastroesophageal reflux disease)   . H/O transfusion of packed red blood cells   . Knee pain   . Tinnitus     PAST SURGICAL HISTORY: History reviewed. No pertinent surgical history.  MEDICATIONS AT HOME: Prior to Admission medications   Not on File    Social-engaged, 6 children, works for [employer]  Objective:   Vitals:   02/07/18 1541  BP: 133/89  Pulse: 95  Resp: 18  SpO2: 97%  Weight: 240 lb 3.2 oz (109 kg)  Height: 5\' 4"  (1.626 m)    Exam-benign General appearance : Awake, alert, not in any distress. Speech Clear. Not toxic looking HEENT: Atraumatic and Normocephalic, pupils equally reactive to light and accomodation Neck: supple, no JVD. No cervical lymphadenopathy.  Chest:Good air entry bilaterally, no added sounds  CVS: S1 S2 regular, no murmurs.  Abdomen: Bowel sounds present, Non tender and not distended with no guarding, rigidity or rebound. Extremities: B/L Lower Ext shows no edema, both legs are warm to touch Neurology: Awake alert, and oriented X 3, CN II-XII intact, Non focal Skin:No Rash Wounds:N/A  Data Review Lab Results  Component Value Date   HGBA1C 5.1 01/30/2018     Assessment & Plan  1. Right sided weakness  -encouraged to go to [facility] exercise  -weight loss  -Neuro consult 2. HAa  -avoid triggers   -Neuro consult  - 3. Fe def anemia  -labs today  -hematology referral, likely needs IV Fe infusions   4. Hypokalemia  -  receheck   Return in about 1 month (around 03/07/2018).  The patient was given clear instructions to go to ER or return to medical center if symptoms don't improve, worsen or new problems develop. The patient verbalized understanding. The patient was told to call to get lab results if they haven't heard anything in the next week.   Total time spent with patient was 32 min. Greater than 50 % of this visit was spent face to face counseling and coordinating care regarding risk factor modification, compliance  importance and encouragement, education related to hospitalization review.  This note has been created with Surveyor, quantity. Any transcriptional errors are unintentional.    Zettie Pho, PA-C [facility] [place], [place]   02/07/2018, 4:06 PM

## 2018-02-09 LAB — CBC WITH DIFFERENTIAL/PLATELET
Basophils Absolute: 0 10*3/uL (ref 0.0–0.2)
Basos: 0 %
EOS (ABSOLUTE): 0.2 10*3/uL (ref 0.0–0.4)
Eos: 2 %
Hematocrit: 31.6 % — ABNORMAL LOW (ref 34.0–46.6)
Hemoglobin: 9.4 g/dL — ABNORMAL LOW (ref 11.1–15.9)
Immature Grans (Abs): 0 10*3/uL (ref 0.0–0.1)
Immature Granulocytes: 0 %
Lymphocytes Absolute: 2.3 10*3/uL (ref 0.7–3.1)
Lymphs: 28 %
MCH: 21.7 pg — ABNORMAL LOW (ref 26.6–33.0)
MCHC: 29.7 g/dL — ABNORMAL LOW (ref 31.5–35.7)
MCV: 73 fL — ABNORMAL LOW (ref 79–97)
Monocytes Absolute: 0.9 10*3/uL (ref 0.1–0.9)
Monocytes: 11 %
Neutrophils Absolute: 4.9 10*3/uL (ref 1.4–7.0)
Neutrophils: 59 %
Platelets: 366 10*3/uL (ref 150–379)
RBC: 4.33 x10E6/uL (ref 3.77–5.28)
RDW: 25.4 % — ABNORMAL HIGH (ref 12.3–15.4)
WBC: 8.4 10*3/uL (ref 3.4–10.8)

## 2018-02-09 LAB — BASIC METABOLIC PANEL
BUN/Creatinine Ratio: 17 (ref 9–23)
BUN: 13 mg/dL (ref 6–20)
CO2: 24 mmol/L (ref 20–29)
Calcium: 9.3 mg/dL (ref 8.7–10.2)
Chloride: 102 mmol/L (ref 96–106)
Creatinine, Ser: 0.76 mg/dL (ref 0.57–1.00)
GFR calc Af Amer: 118 mL/min/{1.73_m2} (ref >59)
GFR calc non Af Amer: 102 mL/min/{1.73_m2} (ref >59)
Glucose: 89 mg/dL (ref 65–99)
Potassium: 4.2 mmol/L (ref 3.5–5.2)
Sodium: 139 mmol/L (ref 134–144)

## 2018-02-18 ENCOUNTER — Encounter: Payer: Self-pay | Admitting: Physician Assistant

## 2018-02-24 ENCOUNTER — Encounter: Payer: Self-pay | Admitting: Physician Assistant

## 2018-02-25 NOTE — Telephone Encounter (Signed)
Please advise patient on pain concern post stroke

## 2018-03-12 ENCOUNTER — Encounter: Payer: Self-pay | Admitting: Internal Medicine

## 2018-03-12 ENCOUNTER — Telehealth: Payer: Self-pay | Admitting: Hematology

## 2018-03-12 ENCOUNTER — Encounter: Payer: Self-pay | Admitting: Hematology

## 2018-03-12 ENCOUNTER — Ambulatory Visit: Payer: Medicaid Other | Attending: Internal Medicine | Admitting: Internal Medicine

## 2018-03-12 VITALS — BP 124/82 | HR 65 | Temp 98.2°F | Resp 16 | Ht 64.0 in | Wt 243.8 lb

## 2018-03-12 DIAGNOSIS — D259 Leiomyoma of uterus, unspecified: Secondary | ICD-10-CM | POA: Diagnosis not present

## 2018-03-12 DIAGNOSIS — Z6841 Body Mass Index (BMI) 40.0 and over, adult: Secondary | ICD-10-CM | POA: Diagnosis not present

## 2018-03-12 DIAGNOSIS — N920 Excessive and frequent menstruation with regular cycle: Secondary | ICD-10-CM

## 2018-03-12 DIAGNOSIS — M7061 Trochanteric bursitis, right hip: Secondary | ICD-10-CM | POA: Diagnosis not present

## 2018-03-12 DIAGNOSIS — E66813 Obesity, class 3: Secondary | ICD-10-CM | POA: Insufficient documentation

## 2018-03-12 DIAGNOSIS — D500 Iron deficiency anemia secondary to blood loss (chronic): Secondary | ICD-10-CM | POA: Insufficient documentation

## 2018-03-12 NOTE — Progress Notes (Signed)
Patient ID: LOLITA Dickerson, female    DOB: 19-Nov-1980  MRN: [id number]  CC: Establish Care   Subjective: Nancy Dickerson is a 37 y.o. female who presents to est care with me having seen PA 1 mth ago post hosp. Her concerns today include:   IDA (iron def anemia): Patient transfused with packed red blood cells while in hospital.  She also reports getting IV iron.  She does not take oral iron.  Reports that taking oral iron in the past did not increase her hemoglobin even when she took it with orange juice and vitamin C.  She was given intravenous iron intermittently when she lived in [place]  -she has history of uterine fibroids with heavy menses that last 7 days.  The last 4 days of the periods are very heavy where she has to change pads every 1/2-3 hours.  Endorses a lot of clotting.  Reports being tried on Depo-Provera shots in the past but it caused weight gain.  Birth control pills decrease the bleeding some but not enough to prevent recurrent anemia.  She has Essure in place. -Referred to hematology on last visit and was called but she had to postpone the appointment because she did not have Medicaid at that time.  She was just approved for Medicaid.  Obesity:  "I feel like I just have to eat all the time."  She likes Sprite and apple juice.  Not getting in much exercise.  Pain in RT thigh x 1 wk Hurts when she lay on the RT side and with  going up steps, getting in car.  She describes it as a stabbing pain.    She was referred to neurology on last visit for headaches.  She was called but again had to postpone because she did not have Medicaid at that time. He is wanting a release to return to work part-time until she is seen all of the specialists.  She thinks that she had a stroke when she was hospitalized in April but stroke work-up was actually negative and she was not discharged on aspirin. Patient Active Problem List   Diagnosis Date Noted  . Iron deficiency anemia due to chronic  blood loss 01/29/2018  . Hypokalemia 01/29/2018  . Right sided weakness 01/29/2018     No current outpatient medications on file prior to visit.   No current facility-administered medications on file prior to visit.     No Known Allergies  Social History   Socioeconomic History  . Marital status: Single    Spouse name: Not on file  . Number of children: Not on file  . Years of education: Not on file  . Highest education level: Not on file  Occupational History  . Not on file  Social Needs  . Financial resource strain: Not on file  . Food insecurity:    Worry: Not on file    Inability: Not on file  . Transportation needs:    Medical: Not on file    Non-medical: Not on file  Tobacco Use  . Smoking status: Never Smoker  . Smokeless tobacco: Never Used  Substance and Sexual Activity  . Alcohol use: Yes    Comment: rare  . Drug use: Yes    Types: Marijuana  . Sexual activity: Not on file  Lifestyle  . Physical activity:    Days per week: Not on file    Minutes per session: Not on file  . Stress: Not on file  Relationships  .  Social connections:    Talks on phone: Not on file    Gets together: Not on file    Attends religious service: Not on file    Active member of club or organization: Not on file    Attends meetings of clubs or organizations: Not on file    Relationship status: Not on file  . Intimate partner violence:    Fear of current or ex partner: Not on file    Emotionally abused: Not on file    Physically abused: Not on file    Forced sexual activity: Not on file  Other Topics Concern  . Not on file  Social History Narrative   ** Merged History Encounter **        No family history on file.  No past surgical history on file.  ROS: Review of Systems Negative except as stated above PHYSICAL EXAM: BP 124/82   Pulse 65   Temp 98.2 F (36.8 C) (Oral)   Resp 16   Ht 5\' 4"  (1.626 m)   Wt 243 lb 12.8 oz (110.6 kg)   SpO2 97%   BMI 41.85  kg/m   Physical Exam  General appearance - alert, well appearing, and in no distress Mental status - normal mood, behavior, speech, dress, motor activity, and thought processes Mouth - mucous membranes moist, pharynx normal without lesions Neck - supple, no significant adenopathy Chest - clear to auscultation, no wheezes, rales or rhonchi, symmetric air entry Heart - normal rate, regular rhythm, normal S1, S2, no murmurs, rubs, clicks or gallops Musculoskeletal -right leg: Good flexion extension and rotation of the right hip.  Mild tenderness on palpation over the trochanteric bursa.   Extremities - peripheral pulses normal, no pedal edema, no clubbing or cyanosis  ASSESSMENT AND PLAN: 1. Iron deficiency anemia due to chronic blood loss Will refer to hematology to receive iron intermittently when needed.  Last hemoglobin had increased to points to around 9.  We will also refer to gynecology to address the menorrhagia and uterine fibroids - Ambulatory referral to Hematology  2. Class 3 severe obesity due to excess calories without serious comorbidity with body mass index (BMI) of 40.0 to 44.9 in adult (HCC) - Amb ref to Medical Nutrition Therapy-MNT  3. Trochanteric bursitis of right hip - Ambulatory referral to Orthopedic Surgery  4. Menorrhagia with regular cycle - Ambulatory referral to Gynecology  5. Uterine leiomyoma, unspecified location - Ambulatory referral to Gynecology  Patient was given the opportunity to ask questions.  Patient verbalized understanding of the plan and was able to repeat key elements of the plan.   Orders Placed This Encounter  Procedures  . Ambulatory referral to Orthopedic Surgery  . Ambulatory referral to Hematology  . Ambulatory referral to Gynecology  . Amb ref to Medical Nutrition Therapy-MNT     Requested Prescriptions    No prescriptions requested or ordered in this encounter    Return in about 2 months (around 05/12/2018).  Karle Plumber, MD, FACP

## 2018-03-12 NOTE — Telephone Encounter (Signed)
New hematology referral from Dr. Wynetta Emery for dx of IDA. Pt has been scheduled for the pt to see Dr. Irene Limbo on 6/18 at 10am. Pt aware to arrive 30 minutes early to be checked in on time. Letter mailed to the pt.

## 2018-03-19 ENCOUNTER — Ambulatory Visit (INDEPENDENT_AMBULATORY_CARE_PROVIDER_SITE_OTHER): Payer: Medicaid Other | Admitting: Physician Assistant

## 2018-03-19 ENCOUNTER — Ambulatory Visit (INDEPENDENT_AMBULATORY_CARE_PROVIDER_SITE_OTHER): Payer: Medicaid Other

## 2018-03-19 ENCOUNTER — Encounter (INDEPENDENT_AMBULATORY_CARE_PROVIDER_SITE_OTHER): Payer: Self-pay | Admitting: Physician Assistant

## 2018-03-19 DIAGNOSIS — M7061 Trochanteric bursitis, right hip: Secondary | ICD-10-CM | POA: Diagnosis not present

## 2018-03-19 DIAGNOSIS — M25551 Pain in right hip: Secondary | ICD-10-CM

## 2018-03-19 MED ORDER — METHYLPREDNISOLONE ACETATE 40 MG/ML IJ SUSP
40.0000 mg | INTRAMUSCULAR | Status: AC | PRN
  Administered 2018-03-19: 40 mg via INTRA_ARTICULAR

## 2018-03-19 MED ORDER — LIDOCAINE HCL 1 % IJ SOLN
3.0000 mL | INTRAMUSCULAR | Status: AC | PRN
  Administered 2018-03-19: 3 mL

## 2018-03-19 NOTE — Progress Notes (Signed)
   Procedure Note  Patient: Nancy Dickerson             Date of Birth: 11-05-1980           MRN: [id number]             Visit Date: 03/19/2018  Procedures: Visit Diagnoses: Pain in right hip - Plan: XR HIP UNILAT W OR W/O PELVIS 1V RIGHT  Large Joint Inj: R greater trochanter on 03/19/2018 4:37 PM Indications: pain Details: 22 G 1.5 in needle, lateral approach  Arthrogram: No  Medications: 3 mL lidocaine 1 %; 40 mg methylPREDNISolone acetate 40 MG/ML Outcome: tolerated well, no immediate complications Procedure, treatment alternatives, risks and benefits explained, specific risks discussed. Consent was given by the patient. Immediately prior to procedure a time out was called to verify the correct patient, procedure, equipment, support staff and site/side marked as required. Patient was prepped and draped in the usual sterile fashion.

## 2018-03-19 NOTE — Progress Notes (Signed)
Office Visit Note   Patient: Nancy Dickerson           Date of Birth: [birth year]           MRN: [id number] Visit Date: 03/19/2018              Requested by: Ladell Pier, MD [address] [place], [place] [address] PCP: Ladell Pier, MD   Assessment & Plan: Visit Diagnoses:  1. Pain in right hip     Plan: I have her work on IT band stretching exercises as shown.  She will also work on flexion of the right hip.  We will see her back in 2 weeks response to the injection.  Follow-Up Instructions: Return in about 2 weeks (around 04/02/2018).   Orders:  Orders Placed This Encounter  Procedures  . Large Joint Inj: R greater trochanter  . XR HIP UNILAT W OR W/O PELVIS 1V RIGHT   No orders of the defined types were placed in this encounter.     Procedures: No procedures performed   Clinical Data: No additional findings.   Subjective: Chief Complaint  Patient presents with  . Right Hip - Pain    HPI 's Nancy Dickerson is a 37 year old female with right thigh and hip pain is been going on for approximately a week and a half.  She states that she has chondromalacia both knees.  She is also had trochanteric bursitis in the past.  She denies any radicular symptoms down the left leg.  She has no numbness tingling down either leg.  She has pain down into the right tibia at times.  Most of her pain noticed the right lateral hip and some pain in the groin.  In April this year she was admitted for possible stroke but had normal MRI/MRA and CT of the head.  She was found to have hypokalemia and was anemic both of these were addressed.  She apparently had hemiparesis right side with a headache after smoking marijuana. Review of Systems Please see HPI otherwise negative  Objective: Vital Signs: There were no vitals taken for this visit.  Physical Exam  Constitutional: She is oriented to person, place, and time. She appears well-developed and well-nourished. No distress.    Cardiovascular: Intact distal pulses.  Pulmonary/Chest: Effort normal.  Neurological: She is alert and oriented to person, place, and time.  Skin: She is not diaphoretic.  Psychiatric: She has a normal mood and affect.    Ortho Exam Bilateral hips good range of motion some discomfort right hip with extreme external rotation.  Tenderness over the right greater trochanteric region.  Right lateral hip no rashes skin lesions ulcerations erythema.  Bilateral calves supple and nontender.  No significant edema in bilateral lower extremities 5 out of 5 strength throughout the lower extremities.  Deep tendon reflexes are 2+ at the knees and ankles are 1+ and equal and symmetric.  Negative straight leg raise bilaterally.  Sensation intact bilateral feet to light touch. Specialty Comments:  No specialty comments available.  Imaging: Xr Hip Unilat W Or W/o Pelvis 1v Right  Result Date: 03/19/2018 AP pelvis lateral view of the right hip: No acute fractures.  Right hip is well located.  Bilateral hips are well preserved.    PMFS History: Patient Active Problem List   Diagnosis Date Noted  . Uterine leiomyoma 03/12/2018  . Trochanteric bursitis of right hip 03/12/2018  . Class 3 severe obesity due to excess calories without serious comorbidity with  body mass index (BMI) of 40.0 to 44.9 in adult ([place]) 03/12/2018  . Iron deficiency anemia due to chronic blood loss 01/29/2018  . Hypokalemia 01/29/2018  . Right sided weakness 01/29/2018   Past Medical History:  Diagnosis Date  . Anemia   . Asthma   . Bursitis of hip   . Cardiomegaly   . Chondromalacia of hip   . Chondromalacia of right knee   . COPD (chronic obstructive pulmonary disease) ([place])   . GERD (gastroesophageal reflux disease)   . H/O transfusion of packed red blood cells   . Knee pain   . Tinnitus     No family history on file.  No past surgical history on file. Social History   Occupational History  . Not on file  Tobacco  Use  . Smoking status: Never Smoker  . Smokeless tobacco: Never Used  Substance and Sexual Activity  . Alcohol use: Yes    Comment: rare  . Drug use: Yes    Types: Marijuana  . Sexual activity: Not on file

## 2018-03-20 ENCOUNTER — Encounter (INDEPENDENT_AMBULATORY_CARE_PROVIDER_SITE_OTHER): Payer: Self-pay | Admitting: Physician Assistant

## 2018-03-20 ENCOUNTER — Telehealth (INDEPENDENT_AMBULATORY_CARE_PROVIDER_SITE_OTHER): Payer: Self-pay | Admitting: Radiology

## 2018-03-20 ENCOUNTER — Ambulatory Visit (INDEPENDENT_AMBULATORY_CARE_PROVIDER_SITE_OTHER): Payer: Medicaid Other | Admitting: Physician Assistant

## 2018-03-20 DIAGNOSIS — M25551 Pain in right hip: Secondary | ICD-10-CM

## 2018-03-20 NOTE — Progress Notes (Signed)
HPI: Mrs. Mowbray returns today status post trochanteric injection right hip yesterday.  She states she is having numbness in her right buttocks since the injection.  And increased hip pain.  She is ambulating with a cane.  She denies any fevers chills.  Physical exam: Tenderness over the right greater trochanteric region.  There is no rashes skin lesions ulcerations about the right hip.  There is slight bruise over the area of injection.  She has 5 out of 5 strength throughout the lower extremities against resistance.  Negative straight leg raise bilaterally.  Impression: Right hip pain with new onset of buttocks numbness status post trochanteric injection  Plan: Would like for her to do the IT band stretches and is shown yesterday continue with ice heat for comfort.  She is anemic and would not start her on any type  NSAID due to her anemia of unknown origin.  We will see her back next week to check her progress lack of.

## 2018-03-20 NOTE — Telephone Encounter (Signed)
Patient called and said that she is having severe and excruciating pain since the injection yesterday, and it is getting worse.  Worked in today to see Artis Delay.

## 2018-03-24 ENCOUNTER — Encounter (INDEPENDENT_AMBULATORY_CARE_PROVIDER_SITE_OTHER): Payer: Self-pay | Admitting: Physician Assistant

## 2018-03-25 NOTE — Progress Notes (Signed)
HEMATOLOGY/ONCOLOGY CONSULTATION NOTE  Date of Service: 03/26/2018  Patient Care Team: Ladell Pier, MD as PCP - General (Internal Medicine) Patient, No Pcp Per (General Practice)  CHIEF COMPLAINTS/PURPOSE OF CONSULTATION:  Iron deficiency anemia   HISTORY OF PRESENTING ILLNESS:  Nancy Dickerson is a wonderful 37 y.o. female who has been referred to [place] by Zettie Pho, PA for evaluation and management of Iron deficiency anemia. She is accompanied today by her daughter and nephew. The pt reports that she is doing well overall. She moved to [facility] from [place] in 2017.   The pt had a blood transfusion on 01/29/18, one unit of PRBCs and IV Iron while hospitalized for symptoms concerning for a stroke, including right sided paralysis and low potassium. She has previously required  IV Iron for three successive days. She notes that she is often tired and sleeps much of the day.She notes that she has picca for chewing wash cloths lightly soaked in bleach.   The pt reports knowing about her iron deficiency anemia since she was 9, which was believed to be related to fibroids and her heavy bleeding. She was checked for sickle cell and was negative. She notes that she has tried depoprogesterone and PO contraceptives as well as esure. She is considering Mirena or other IUDs. She notes that her periods last 7-10 days with 4 heavy days, and passes clots. She has not yet established care with a local OBGYN.   She notes that she has taken PO iron replacement in the past, tolerating these well besides constipation. She denies any medication allergies, or regularly taking any medications.   She notes that she hemorrhaged after her second son's birth and required a blood transfusion at this time.  She notes that she is consistently constipated which has not yet been worked up. She denies blood in the stools or black stools. She notes that she feels significant gas build up and sites right sided abdominal  pain. She notes that her stomach also swells up. She is following up with Zettie Pho for a GI referral.   She notes that she had a sleep study last year which did not reveal that she had sleep apnea.   Most recent lab results (02/07/18) of CBC w/diff is as follows: all values are WNL except for HGB at 9.4, HCT at 31.6, MCV at 73, MCH at 21.7, MCHC at 29.7, RDW at 25.4.  On review of systems, pt reports weakness, sleepiness, fatigue, heavy periods, constipation, abdominal discomfort and denies unexpected weight loss, problems passing urine, and any other symptoms.   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 On Social Hx the pt reports social ETOH consumption, denies ever smoking. She works as a Radiation protection practitioner.  On Family Hx the pt reports maternal HTN and liver cancer, paternal grandfather with cancer. She notes sickle cell trait in her sister and niece, and denies other blood disorders Paternal uncle with colon cancer in his 83s.    MEDICAL HISTORY:  Past Medical History:  Diagnosis Date  . Anemia   . Asthma   . Bursitis of hip   . Cardiomegaly   . Chondromalacia of hip   . Chondromalacia of right knee   . COPD (chronic obstructive pulmonary disease) ([place])   . GERD (gastroesophageal reflux disease)   . H/O transfusion of packed red blood cells   . Knee  pain   . Tinnitus     SURGICAL HISTORY: History reviewed. No pertinent surgical history.  SOCIAL HISTORY: Social History   Socioeconomic History  . Marital status: Single    Spouse name: Not on file  . Number of children: Not on file  . Years of education: Not on file  . Highest education level: Not on file  Occupational History  . Not on file  Social Needs  . Financial resource strain: Not on file  . Food insecurity:    Worry: Not on file    Inability: Not on file  . Transportation needs:     Medical: Not on file    Non-medical: Not on file  Tobacco Use  . Smoking status: Never Smoker  . Smokeless tobacco: Never Used  Substance and Sexual Activity  . Alcohol use: Yes    Comment: rare  . Drug use: Yes    Types: Marijuana  . Sexual activity: Not on file  Lifestyle  . Physical activity:    Days per week: Not on file    Minutes per session: Not on file  . Stress: Not on file  Relationships  . Social connections:    Talks on phone: Not on file    Gets together: Not on file    Attends religious service: Not on file    Active member of club or organization: Not on file    Attends meetings of clubs or organizations: Not on file    Relationship status: Not on file  . Intimate partner violence:    Fear of current or ex partner: Not on file    Emotionally abused: Not on file    Physically abused: Not on file    Forced sexual activity: Not on file  Other Topics Concern  . Not on file  Social History Narrative   ** Merged History Encounter **        FAMILY HISTORY: History reviewed. No pertinent family history.  ALLERGIES:  has No Known Allergies.  MEDICATIONS:  No current outpatient medications on file.   No current facility-administered medications for this visit.     REVIEW OF SYSTEMS:    10 Point review of Systems was done is negative except as noted above.  PHYSICAL EXAMINATION:  . Vitals:   03/26/18 1021  BP: (!) 141/92  Pulse: 79  Resp: 18  Temp: 97.9 F (36.6 C)  SpO2: 100%   Filed Weights   03/26/18 1021  Weight: 242 lb 4.8 oz (109.9 kg)   .Body mass index is 41.59 kg/m.  GENERAL:alert, in no acute distress and comfortable SKIN: no acute rashes, no significant lesions EYES: conjunctiva are pink and non-injected, sclera anicteric OROPHARYNX: MMM, no exudates, no oropharyngeal erythema or ulceration NECK: supple, no JVD LYMPH:  no palpable lymphadenopathy in the cervical, axillary or inguinal regions LUNGS: clear to auscultation  b/l with normal respiratory effort HEART: regular rate & rhythm ABDOMEN:  normoactive bowel sounds , non tender, not distended. Extremity: no pedal edema PSYCH: alert & oriented x 3 with fluent speech NEURO: no focal motor/sensory deficits  LABORATORY DATA:  I have reviewed the data as listed  . CBC Latest Ref Rng & Units 03/26/2018 02/07/2018 01/30/2018  WBC 3.9 - 10.3 K/uL 8.9 8.4 7.6  Hemoglobin 11.6 - 15.9 g/dL 11.6 9.4(L) 7.8(L)  Hematocrit 34.8 - 46.6 % 36.6 31.6(L) 26.4(L)  Platelets 145 - 400 K/uL 415(H) 366 435(H)   . CBC    Component Value Date/Time   WBC  8.9 03/26/2018 1152   RBC 4.56 03/26/2018 1152   HGB 11.6 03/26/2018 1152   HGB 9.4 (L) 02/07/2018 1625   HCT 36.6 03/26/2018 1152   HCT 31.6 (L) 02/07/2018 1625   PLT 415 (H) 03/26/2018 1152   PLT 366 02/07/2018 1625   MCV 80.3 03/26/2018 1152   MCV 73 (L) 02/07/2018 1625   MCV 74 (L) 10/25/2014 1428   MCH 25.5 03/26/2018 1152   MCHC 31.8 03/26/2018 1152   RDW 24.7 (H) 03/26/2018 1152   RDW 25.4 (H) 02/07/2018 1625   RDW 18.9 (H) 10/25/2014 1428   LYMPHSABS 2.0 03/26/2018 1152   LYMPHSABS 2.3 02/07/2018 1625   LYMPHSABS 2.6 10/25/2014 1428   MONOABS 0.6 03/26/2018 1152   MONOABS 0.5 10/25/2014 1428   EOSABS 0.1 03/26/2018 1152   EOSABS 0.2 02/07/2018 1625   EOSABS 0.2 10/25/2014 1428   BASOSABS 0.1 03/26/2018 1152   BASOSABS 0.0 02/07/2018 1625   BASOSABS 0.1 10/25/2014 1428     . CMP Latest Ref Rng & Units 03/26/2018 02/07/2018 01/30/2018  Glucose 70 - 140 mg/dL 93 89 98  BUN 7 - 26 mg/dL 11 13 7   Creatinine 0.60 - 1.10 mg/dL 0.81 0.76 0.70  Sodium 136 - 145 mmol/L 137 139 138  Potassium 3.5 - 5.1 mmol/L 4.1 4.2 3.6  Chloride 98 - 109 mmol/L 101 102 108  CO2 22 - 29 mmol/L 27 24 24   Calcium 8.4 - 10.4 mg/dL 10.0 9.3 8.5(L)  Total Protein 6.4 - 8.3 g/dL 8.5(H) - -  Total Bilirubin 0.2 - 1.2 mg/dL 0.4 - -  Alkaline Phos 40 - 150 U/L 57 - -  AST 5 - 34 U/L 16 - -  ALT 0 - 55 U/L 20 - -      RADIOGRAPHIC STUDIES: I have personally reviewed the radiological images as listed and agreed with the findings in the report. Xr Hip Unilat W Or W/o Pelvis 1v Right  Result Date: 03/19/2018 AP pelvis lateral view of the right hip: No acute fractures.  Right hip is well located.  Bilateral hips are well preserved.   ASSESSMENT & PLAN:  37 y.o. female with  1. Iron deficiency anemia Improved hgb from 9.4 to 11.6 in the setting of PRBC transfusion and IV iron in 01/2018. Still quite iron deficient and with ongoing significant menorrhagia. PLAN -Discussed patient's most recent labs from 02/07/18, HGB at 9.4 and ferritin of 4. -reviewed labs from today  -Will set pt up for IV Injectafer x 2 doses - discussed pros/cons and possible adverse effects -- informed consent obtained. -Continue follow up with PCP for GI work up and referral -Begin taking OTC Vitamin B complex - to support accelerated hematopoeisis -Recommend PCP address menorrhagia with OBGYN referral and GI referral for possible colonoscopy given new constipation, family history of colon cancer, and in the setting of iron deficiency.   . Orders Placed This Encounter  Procedures  . CBC with Differential/Platelet    Standing Status:   Future    Number of Occurrences:   1    Standing Expiration Date:   04/30/2019  . CMP ([facility] only)    Standing Status:   Future    Number of Occurrences:   1    Standing Expiration Date:   03/27/2019  . Ferritin    Standing Status:   Future    Number of Occurrences:   1    Standing Expiration Date:   03/27/2019  . Iron and TIBC  Standing Status:   Future    Number of Occurrences:   1    Standing Expiration Date:   03/26/2019  . Vitamin B12    Standing Status:   Future    Number of Occurrences:   1    Standing Expiration Date:   03/26/2019  . CBC with Differential/Platelet    Standing Status:   Future    Standing Expiration Date:   04/30/2019  . Ferritin    Standing Status:    Future    Standing Expiration Date:   03/27/2019  . Iron and TIBC    Standing Status:   Future    Standing Expiration Date:   03/26/2019    Labs today IV Injectafer weekly x 2 doses RTC with Dr Irene Limbo with labs in 2 months   All of the patients questions were answered with apparent satisfaction. The patient knows to call the clinic with any problems, questions or concerns.  The toal time spent in the appt was 35 minutes and more than 50% was on counseling and direct patient cares.    Sullivan Lone MD MS AAHIVMS [facility] Hematology/Oncology Physician [facility]  (Office):       [phone] (Work cell):  [phone] (Fax):           [phone]  03/26/2018 11:16 AM  I, Baldwin [place], am acting as a Education administrator for Dr Irene Limbo.   .I have reviewed the above documentation for accuracy and completeness, and I agree with the above. Brunetta Genera MD

## 2018-03-26 ENCOUNTER — Inpatient Hospital Stay: Payer: Medicaid Other

## 2018-03-26 ENCOUNTER — Telehealth: Payer: Self-pay

## 2018-03-26 ENCOUNTER — Inpatient Hospital Stay: Payer: Medicaid Other | Attending: Hematology | Admitting: Hematology

## 2018-03-26 ENCOUNTER — Encounter: Payer: Self-pay | Admitting: Hematology

## 2018-03-26 VITALS — BP 141/92 | HR 79 | Temp 97.9°F | Resp 18 | Ht 64.0 in | Wt 242.3 lb

## 2018-03-26 DIAGNOSIS — K5900 Constipation, unspecified: Secondary | ICD-10-CM | POA: Diagnosis not present

## 2018-03-26 DIAGNOSIS — Z800 Family history of malignant neoplasm of digestive organs: Secondary | ICD-10-CM

## 2018-03-26 DIAGNOSIS — D500 Iron deficiency anemia secondary to blood loss (chronic): Secondary | ICD-10-CM | POA: Diagnosis not present

## 2018-03-26 DIAGNOSIS — N920 Excessive and frequent menstruation with regular cycle: Secondary | ICD-10-CM | POA: Insufficient documentation

## 2018-03-26 LAB — CBC WITH DIFFERENTIAL/PLATELET
Basophils Absolute: 0.1 10*3/uL (ref 0.0–0.1)
Basophils Relative: 1 %
Eosinophils Absolute: 0.1 10*3/uL (ref 0.0–0.5)
Eosinophils Relative: 1 %
HCT: 36.6 % (ref 34.8–46.6)
Hemoglobin: 11.6 g/dL (ref 11.6–15.9)
Lymphocytes Relative: 23 %
Lymphs Abs: 2 10*3/uL (ref 0.9–3.3)
MCH: 25.5 pg (ref 25.1–34.0)
MCHC: 31.8 g/dL (ref 31.5–36.0)
MCV: 80.3 fL (ref 79.5–101.0)
Monocytes Absolute: 0.6 10*3/uL (ref 0.1–0.9)
Monocytes Relative: 7 %
Neutro Abs: 6.1 10*3/uL (ref 1.5–6.5)
Neutrophils Relative %: 68 %
Platelets: 415 10*3/uL — ABNORMAL HIGH (ref 145–400)
RBC: 4.56 MIL/uL (ref 3.70–5.45)
RDW: 24.7 % — ABNORMAL HIGH (ref 11.2–14.5)
WBC: 8.9 10*3/uL (ref 3.9–10.3)

## 2018-03-26 LAB — CMP (CANCER CENTER ONLY)
ALT: 20 U/L (ref 0–55)
AST: 16 U/L (ref 5–34)
Albumin: 4.4 g/dL (ref 3.5–5.0)
Alkaline Phosphatase: 57 U/L (ref 40–150)
Anion gap: 9 (ref 3–11)
BUN: 11 mg/dL (ref 7–26)
CO2: 27 mmol/L (ref 22–29)
Calcium: 10 mg/dL (ref 8.4–10.4)
Chloride: 101 mmol/L (ref 98–109)
Creatinine: 0.81 mg/dL (ref 0.60–1.10)
GFR, Est AFR Am: >60 mL/min (ref >60)
GFR, Estimated: >60 mL/min (ref >60)
Glucose, Bld: 93 mg/dL (ref 70–140)
Potassium: 4.1 mmol/L (ref 3.5–5.1)
Sodium: 137 mmol/L (ref 136–145)
Total Bilirubin: 0.4 mg/dL (ref 0.2–1.2)
Total Protein: 8.5 g/dL — ABNORMAL HIGH (ref 6.4–8.3)

## 2018-03-26 LAB — VITAMIN B12: Vitamin B-12: 293 pg/mL (ref 180–914)

## 2018-03-26 LAB — IRON AND TIBC
Iron: 72 ug/dL (ref 41–142)
Saturation Ratios: 18 % — ABNORMAL LOW (ref 21–57)
TIBC: 405 ug/dL (ref 236–444)
UIBC: 333 ug/dL

## 2018-03-26 LAB — FERRITIN: Ferritin: 22 ng/mL (ref 9–269)

## 2018-03-26 NOTE — Telephone Encounter (Signed)
Printed avs and calender of upcoming appointment per 6/18 los. Unable to schedule injectafer with [facility] due to not Answering there phones

## 2018-03-28 ENCOUNTER — Telehealth (INDEPENDENT_AMBULATORY_CARE_PROVIDER_SITE_OTHER): Payer: Self-pay | Admitting: Physician Assistant

## 2018-03-28 ENCOUNTER — Ambulatory Visit (INDEPENDENT_AMBULATORY_CARE_PROVIDER_SITE_OTHER): Payer: Medicaid Other | Admitting: Physician Assistant

## 2018-03-28 NOTE — Telephone Encounter (Signed)
NEEDS FOLLOW UP NO PAIN MEDS.

## 2018-03-28 NOTE — Telephone Encounter (Signed)
Patient called and cancelled her appointment stating that she is in too much pain to make it to the appointment.  She also stated that the injection did not work and would like something for pain.  CB#[phone].  Thank you.

## 2018-03-28 NOTE — Telephone Encounter (Signed)
Please advise 

## 2018-03-29 NOTE — Telephone Encounter (Signed)
Patient has an appointment on Tuesday, July 2nd with Dr. Ninfa Linden.  Was not able to get her in with either provider next week.  Thank you

## 2018-03-29 NOTE — Telephone Encounter (Signed)
Can you make her an appt with Artis Delay next week please, he denied pain medicine

## 2018-04-01 ENCOUNTER — Ambulatory Visit (INDEPENDENT_AMBULATORY_CARE_PROVIDER_SITE_OTHER): Payer: Medicaid Other | Admitting: Physician Assistant

## 2018-04-01 ENCOUNTER — Encounter: Payer: Self-pay | Admitting: Hematology

## 2018-04-04 ENCOUNTER — Inpatient Hospital Stay: Payer: Medicaid Other

## 2018-04-04 VITALS — BP 120/69 | HR 86 | Temp 98.0°F | Resp 16

## 2018-04-04 DIAGNOSIS — D500 Iron deficiency anemia secondary to blood loss (chronic): Secondary | ICD-10-CM | POA: Diagnosis not present

## 2018-04-04 MED ORDER — SODIUM CHLORIDE 0.9 % IV SOLN
750.0000 mg | Freq: Once | INTRAVENOUS | Status: AC
  Administered 2018-04-04: 750 mg via INTRAVENOUS
  Filled 2018-04-04: qty 15

## 2018-04-04 NOTE — Patient Instructions (Signed)
Ferric carboxymaltose injection What is this medicine? FERRIC CARBOXYMALTOSE (ferr-ik car-box-ee-mol-toes) is an iron complex. Iron is used to make healthy red blood cells, which carry oxygen and nutrients throughout the body. This medicine is used to treat anemia in people with chronic kidney disease or people who cannot take iron by mouth. This medicine may be used for other purposes; ask your health care provider or pharmacist if you have questions. COMMON BRAND NAME(S): Injectafer What should I tell my health care provider before I take this medicine? They need to know if you have any of these conditions: -anemia not caused by low iron levels -high levels of iron in the blood -liver disease -an unusual or allergic reaction to iron, other medicines, foods, dyes, or preservatives -pregnant or trying to get pregnant -breast-feeding How should I use this medicine? This medicine is for infusion into a vein. It is given by a health care professional in a hospital or clinic setting. Talk to your pediatrician regarding the use of this medicine in children. Special care may be needed. Overdosage: If you think you have taken too much of this medicine contact a poison control center or emergency room at once. NOTE: This medicine is only for you. Do not share this medicine with others. What if I miss a dose? It is important not to miss your dose. Call your doctor or health care professional if you are unable to keep an appointment. What may interact with this medicine? Do not take this medicine with any of the following medications: -deferoxamine -dimercaprol -other iron products This medicine may also interact with the following medications: -chloramphenicol -deferasirox This list may not describe all possible interactions. Give your health care provider a list of all the medicines, herbs, non-prescription drugs, or dietary supplements you use. Also tell them if you smoke, drink alcohol, or use  illegal drugs. Some items may interact with your medicine. What should I watch for while using this medicine? Visit your doctor or health care professional regularly. Tell your doctor if your symptoms do not start to get better or if they get worse. You may need blood work done while you are taking this medicine. You may need to follow a special diet. Talk to your doctor. Foods that contain iron include: whole grains/cereals, dried fruits, beans, or peas, leafy green vegetables, and organ meats (liver, kidney). What side effects may I notice from receiving this medicine? Side effects that you should report to your doctor or health care professional as soon as possible: -allergic reactions like skin rash, itching or hives, swelling of the face, lips, or tongue -breathing problems -changes in blood pressure -feeling faint or lightheaded, falls -flushing, sweating, or hot feelings Side effects that usually do not require medical attention (report to your doctor or health care professional if they continue or are bothersome): -changes in taste -constipation -dizziness -headache -nausea -pain, redness, or irritation at site where injected -vomiting This list may not describe all possible side effects. Call your doctor for medical advice about side effects. You may report side effects to FDA at 1-800-FDA-1088. Where should I keep my medicine? This drug is given in a hospital or clinic and will not be stored at home. NOTE: This sheet is a summary. It may not cover all possible information. If you have questions about this medicine, talk to your doctor, pharmacist, or health care provider.  2018 Elsevier/Gold Standard (2015-10-28 11:20:47)  

## 2018-04-09 ENCOUNTER — Ambulatory Visit (INDEPENDENT_AMBULATORY_CARE_PROVIDER_SITE_OTHER): Payer: Medicaid Other | Admitting: Orthopaedic Surgery

## 2018-04-12 ENCOUNTER — Inpatient Hospital Stay: Payer: Medicaid Other | Attending: Hematology

## 2018-04-12 VITALS — BP 109/49 | HR 85 | Temp 98.2°F | Resp 18

## 2018-04-12 DIAGNOSIS — D500 Iron deficiency anemia secondary to blood loss (chronic): Secondary | ICD-10-CM | POA: Insufficient documentation

## 2018-04-12 MED ORDER — FERRIC CARBOXYMALTOSE 750 MG/15ML IV SOLN
750.0000 mg | Freq: Once | INTRAVENOUS | Status: AC
  Administered 2018-04-12: 750 mg via INTRAVENOUS
  Filled 2018-04-12: qty 15

## 2018-04-12 MED ORDER — SODIUM CHLORIDE 0.9 % IV SOLN
Freq: Once | INTRAVENOUS | Status: AC
  Administered 2018-04-12: 08:00:00 via INTRAVENOUS

## 2018-04-12 NOTE — Patient Instructions (Signed)
Ferric carboxymaltose injection What is this medicine? FERRIC CARBOXYMALTOSE (ferr-ik car-box-ee-mol-toes) is an iron complex. Iron is used to make healthy red blood cells, which carry oxygen and nutrients throughout the body. This medicine is used to treat anemia in people with chronic kidney disease or people who cannot take iron by mouth. This medicine may be used for other purposes; ask your health care provider or pharmacist if you have questions. COMMON BRAND NAME(S): Injectafer What should I tell my health care provider before I take this medicine? They need to know if you have any of these conditions: -anemia not caused by low iron levels -high levels of iron in the blood -liver disease -an unusual or allergic reaction to iron, other medicines, foods, dyes, or preservatives -pregnant or trying to get pregnant -breast-feeding How should I use this medicine? This medicine is for infusion into a vein. It is given by a health care professional in a hospital or clinic setting. Talk to your pediatrician regarding the use of this medicine in children. Special care may be needed. Overdosage: If you think you have taken too much of this medicine contact a poison control center or emergency room at once. NOTE: This medicine is only for you. Do not share this medicine with others. What if I miss a dose? It is important not to miss your dose. Call your doctor or health care professional if you are unable to keep an appointment. What may interact with this medicine? Do not take this medicine with any of the following medications: -deferoxamine -dimercaprol -other iron products This medicine may also interact with the following medications: -chloramphenicol -deferasirox This list may not describe all possible interactions. Give your health care provider a list of all the medicines, herbs, non-prescription drugs, or dietary supplements you use. Also tell them if you smoke, drink alcohol, or use  illegal drugs. Some items may interact with your medicine. What should I watch for while using this medicine? Visit your doctor or health care professional regularly. Tell your doctor if your symptoms do not start to get better or if they get worse. You may need blood work done while you are taking this medicine. You may need to follow a special diet. Talk to your doctor. Foods that contain iron include: whole grains/cereals, dried fruits, beans, or peas, leafy green vegetables, and organ meats (liver, kidney). What side effects may I notice from receiving this medicine? Side effects that you should report to your doctor or health care professional as soon as possible: -allergic reactions like skin rash, itching or hives, swelling of the face, lips, or tongue -breathing problems -changes in blood pressure -feeling faint or lightheaded, falls -flushing, sweating, or hot feelings Side effects that usually do not require medical attention (report to your doctor or health care professional if they continue or are bothersome): -changes in taste -constipation -dizziness -headache -nausea -pain, redness, or irritation at site where injected -vomiting This list may not describe all possible side effects. Call your doctor for medical advice about side effects. You may report side effects to FDA at 1-800-FDA-1088. Where should I keep my medicine? This drug is given in a hospital or clinic and will not be stored at home. NOTE: This sheet is a summary. It may not cover all possible information. If you have questions about this medicine, talk to your doctor, pharmacist, or health care provider.  2018 Elsevier/Gold Standard (2015-10-28 11:20:47)  

## 2018-04-17 DIAGNOSIS — H5213 Myopia, bilateral: Secondary | ICD-10-CM | POA: Diagnosis not present

## 2018-04-17 DIAGNOSIS — H52223 Regular astigmatism, bilateral: Secondary | ICD-10-CM | POA: Diagnosis not present

## 2018-04-29 ENCOUNTER — Ambulatory Visit: Payer: Medicaid Other | Admitting: Registered"

## 2018-05-03 DIAGNOSIS — H52223 Regular astigmatism, bilateral: Secondary | ICD-10-CM | POA: Diagnosis not present

## 2018-05-14 ENCOUNTER — Encounter: Payer: Self-pay | Admitting: Family Medicine

## 2018-05-21 ENCOUNTER — Inpatient Hospital Stay: Payer: Medicaid Other | Attending: Hematology

## 2018-05-21 ENCOUNTER — Ambulatory Visit: Payer: Medicaid Other | Admitting: Internal Medicine

## 2018-05-21 ENCOUNTER — Inpatient Hospital Stay: Payer: Medicaid Other | Admitting: Hematology

## 2018-06-03 ENCOUNTER — Encounter: Payer: Self-pay | Admitting: Physician Assistant

## 2018-06-04 ENCOUNTER — Telehealth: Payer: Self-pay | Admitting: Hematology

## 2018-06-04 NOTE — Telephone Encounter (Signed)
Returned pt's call to r/s missed appt

## 2018-06-25 NOTE — Progress Notes (Signed)
HEMATOLOGY/ONCOLOGY CLINIC NOTE  Date of Service: 06/26/2018  Patient Care Team: Ladell Pier, MD as PCP - General (Internal Medicine) Patient, No Pcp Per (General Practice)  CHIEF COMPLAINTS/PURPOSE OF CONSULTATION:  Iron deficiency anemia   HISTORY OF PRESENTING ILLNESS:  Nancy Dickerson is a wonderful 37 y.o. female who has been referred to [place] by Zettie Pho, PA for evaluation and management of Iron deficiency anemia. She is accompanied today by her daughter and nephew. The pt reports that she is doing well overall. She moved to [facility] from [place] in 2017.   The pt had a blood transfusion on 01/29/18, one unit of PRBCs and IV Iron while hospitalized for symptoms concerning for a stroke, including right sided paralysis and low potassium. She has previously required  IV Iron for three successive days. She notes that she is often tired and sleeps much of the day.She notes that she has picca for chewing wash cloths lightly soaked in bleach.   The pt reports knowing about her iron deficiency anemia since she was 70, which was believed to be related to fibroids and her heavy bleeding. She was checked for sickle cell and was negative. She notes that she has tried depoprogesterone and PO contraceptives as well as esure. She is considering Mirena or other IUDs. She notes that her periods last 7-10 days with 4 heavy days, and passes clots. She has not yet established care with a local OBGYN.   She notes that she has taken PO iron replacement in the past, tolerating these well besides constipation. She denies any medication allergies, or regularly taking any medications.   She notes that she hemorrhaged after her second son's birth and required a blood transfusion at this time.  She notes that she is consistently constipated which has not yet been worked up. She denies blood in the stools or black stools. She notes that she feels significant gas build up and sites right sided abdominal pain.  She notes that her stomach also swells up. She is following up with Zettie Pho for a GI referral.   She notes that she had a sleep study last year which did not reveal that she had sleep apnea.   Most recent lab results (02/07/18) of CBC w/diff is as follows: all values are WNL except for HGB at 9.4, HCT at 31.6, MCV at 73, MCH at 21.7, MCHC at 29.7, RDW at 25.4.  On review of systems, pt reports weakness, sleepiness, fatigue, heavy periods, constipation, abdominal discomfort and denies unexpected weight loss, problems passing urine, and any other symptoms.   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 On Social Hx the pt reports social ETOH consumption, denies ever smoking. She works as a Radiation protection practitioner.  On Family Hx the pt reports maternal HTN and liver cancer, paternal grandfather with cancer. She notes sickle cell trait in her sister and niece, and denies other blood disorders Paternal uncle with colon cancer in his 5s.   Interval History:   [name] returns today for management and evaluation of her iron deficiency anemia. The patient's last visit with [place] was on 03/26/18. She is accompanied today by her daughter. The pt reports that she is doing well overall.   The pt reports that she tolerated the IV Injectafer infusions very well without any difficulty. She notes that she has continued to feel somewhat fatigued  and tired.   She notes that she has continued to have heavy periods with many clots, lasting 7 days. She notes that she has not established care with an OBGYN in the interim but is planning to begin care on 07/08/18.   The pt notes that she continues to work to try to establish care with GI but has not yet been able to do this either. She notes that she is having IBS symptoms, and notes that she continues to have constipation.   Lab results  today (06/26/18) of CBC w/diff is as follows: all values are WNL except for HGB at 11.1. 06/26/18 Ferritin is 307 up from 22 06/26/18 Iron and TIBC shows all values WNL including 27% saturation ratio  On review of systems, pt reports feeling tired, stable constipation, stable bilateral ankle swelling, and denies abdominal pains, blood in the stools, and any other symptoms.   MEDICAL HISTORY:  Past Medical History:  Diagnosis Date  . Anemia   . Asthma   . Bursitis of hip   . Cardiomegaly   . Chondromalacia of hip   . Chondromalacia of right knee   . COPD (chronic obstructive pulmonary disease) ([place])   . GERD (gastroesophageal reflux disease)   . H/O transfusion of packed red blood cells   . Knee pain   . Tinnitus     SURGICAL HISTORY: No past surgical history on file.  SOCIAL HISTORY: Social History   Socioeconomic History  . Marital status: Single    Spouse name: Not on file  . Number of children: Not on file  . Years of education: Not on file  . Highest education level: Not on file  Occupational History  . Not on file  Social Needs  . Financial resource strain: Not on file  . Food insecurity:    Worry: Not on file    Inability: Not on file  . Transportation needs:    Medical: Not on file    Non-medical: Not on file  Tobacco Use  . Smoking status: Never Smoker  . Smokeless tobacco: Never Used  Substance and Sexual Activity  . Alcohol use: Yes    Comment: rare  . Drug use: Yes    Types: Marijuana  . Sexual activity: Not on file  Lifestyle  . Physical activity:    Days per week: Not on file    Minutes per session: Not on file  . Stress: Not on file  Relationships  . Social connections:    Talks on phone: Not on file    Gets together: Not on file    Attends religious service: Not on file    Active member of club or organization: Not on file    Attends meetings of clubs or organizations: Not on file    Relationship status: Not on file  . Intimate partner  violence:    Fear of current or ex partner: Not on file    Emotionally abused: Not on file    Physically abused: Not on file    Forced sexual activity: Not on file  Other Topics Concern  . Not on file  Social History Narrative   ** Merged History Encounter **        FAMILY HISTORY: No family history on file.  ALLERGIES:  has No Known Allergies.  MEDICATIONS:  No current outpatient medications on file.   No current facility-administered medications for this visit.     REVIEW OF SYSTEMS:    A 10+ POINT REVIEW OF  SYSTEMS WAS OBTAINED including neurology, dermatology, psychiatry, cardiac, respiratory, lymph, extremities, GI, GU, Musculoskeletal, constitutional, breasts, reproductive, HEENT.  All pertinent positives are noted in the HPI.  All others are negative.   PHYSICAL EXAMINATION:  Vitals:   06/26/18 0935  BP: 137/90  Pulse: 72  Resp: 18  Temp: 98.3 F (36.8 C)  SpO2: 100%   Filed Weights   06/26/18 0935  Weight: 250 lb 1.6 oz (113.4 kg)   .Body mass index is 42.93 kg/m.  GENERAL:alert, in no acute distress and comfortable SKIN: no acute rashes, no significant lesions EYES: conjunctiva are pink and non-injected, sclera anicteric OROPHARYNX: MMM, no exudates, no oropharyngeal erythema or ulceration NECK: supple, no JVD LYMPH:  no palpable lymphadenopathy in the cervical, axillary or inguinal regions LUNGS: clear to auscultation b/l with normal respiratory effort HEART: regular rate & rhythm ABDOMEN:  normoactive bowel sounds , non tender, not distended. No palpable hepatosplenomegaly.  Extremity: no pedal edema PSYCH: alert & oriented x 3 with fluent speech NEURO: no focal motor/sensory deficits   LABORATORY DATA:  I have reviewed the data as listed  . CBC Latest Ref Rng & Units 06/26/2018 03/26/2018 02/07/2018  WBC 3.9 - 10.3 K/uL 6.3 8.9 8.4  Hemoglobin 11.6 - 15.9 g/dL 11.1(L) 11.6 9.4(L)  Hematocrit 34.8 - 46.6 % 34.8 36.6 31.6(L)  Platelets 145 - 400  K/uL 341 415(H) 366   . CBC    Component Value Date/Time   WBC 6.3 06/26/2018 0900   RBC 3.87 06/26/2018 0900   HGB 11.1 (L) 06/26/2018 0900   HGB 9.4 (L) 02/07/2018 1625   HCT 34.8 06/26/2018 0900   HCT 31.6 (L) 02/07/2018 1625   PLT 341 06/26/2018 0900   PLT 366 02/07/2018 1625   MCV 89.9 06/26/2018 0900   MCV 73 (L) 02/07/2018 1625   MCV 74 (L) 10/25/2014 1428   MCH 28.7 06/26/2018 0900   MCHC 31.9 06/26/2018 0900   RDW 13.3 06/26/2018 0900   RDW 25.4 (H) 02/07/2018 1625   RDW 18.9 (H) 10/25/2014 1428   LYMPHSABS 2.4 06/26/2018 0900   LYMPHSABS 2.3 02/07/2018 1625   LYMPHSABS 2.6 10/25/2014 1428   MONOABS 0.5 06/26/2018 0900   MONOABS 0.5 10/25/2014 1428   EOSABS 0.2 06/26/2018 0900   EOSABS 0.2 02/07/2018 1625   EOSABS 0.2 10/25/2014 1428   BASOSABS 0.0 06/26/2018 0900   BASOSABS 0.0 02/07/2018 1625   BASOSABS 0.1 10/25/2014 1428     . CMP Latest Ref Rng & Units 03/26/2018 02/07/2018 01/30/2018  Glucose 70 - 140 mg/dL 93 89 98  BUN 7 - 26 mg/dL 11 13 7   Creatinine 0.60 - 1.10 mg/dL 0.81 0.76 0.70  Sodium 136 - 145 mmol/L 137 139 138  Potassium 3.5 - 5.1 mmol/L 4.1 4.2 3.6  Chloride 98 - 109 mmol/L 101 102 108  CO2 22 - 29 mmol/L 27 24 24   Calcium 8.4 - 10.4 mg/dL 10.0 9.3 8.5(L)  Total Protein 6.4 - 8.3 g/dL 8.5(H) - -  Total Bilirubin 0.2 - 1.2 mg/dL 0.4 - -  Alkaline Phos 40 - 150 U/L 57 - -  AST 5 - 34 U/L 16 - -  ALT 0 - 55 U/L 20 - -     RADIOGRAPHIC STUDIES: I have personally reviewed the radiological images as listed and agreed with the findings in the report. No results found.  ASSESSMENT & PLAN:  37 y.o. female with  1. Iron deficiency anemia Improved hgb from 9.4 to 11.6 in the setting  of PRBC transfusion and IV iron in 01/2018. Still quite iron deficient and with ongoing significant menorrhagia.  PLAN:  -Begin taking OTC Vitamin B complex - to support accelerated hematopoeisis -Recommend PCP address menorrhagia with OBGYN referral and GI  referral for possible colonoscopy given new constipation, family history of colon cancer, and in the setting of iron deficiency.  -Discussed pt labwork today, 06/26/18; HGB at 11.1, 27% Saturation ratio -06/26/18 Ferritin is 307-- adequate with no indication for additional IV Iron at this time. -Establish care with OBGYN on 07/08/18 -Recommended previously and recommended again that pt and her PCP consider GI workup given constipation and family history of GI cancer -Will see the pt back in 4 months   . Orders Placed This Encounter  Procedures  . CBC with Differential/Platelet    Standing Status:   Future    Standing Expiration Date:   07/31/2019  . CMP ([place] only)    Standing Status:   Future    Standing Expiration Date:   06/27/2019  . Ferritin    Standing Status:   Future    Standing Expiration Date:   06/27/2019  . Vitamin B12    Standing Status:   Future    Standing Expiration Date:   06/26/2019  . Iron and TIBC    Standing Status:   Future    Standing Expiration Date:   06/26/2019    RTC with dr kale in 4 months with labs   All of the patients questions were answered with apparent satisfaction. The patient knows to call the clinic with any problems, questions or concerns.  The total time spent in the appt was 20 minutes and more than 50% was on counseling and direct patient cares.     Sullivan Lone MD MS AAHIVMS [facility] Hematology/Oncology Physician [facility]  (Office):       [phone] (Work cell):  [phone] (Fax):           [phone]  06/26/2018 10:09 AM  I, Baldwin Jamaica, am acting as a scribe for Dr. Irene Limbo  .I have reviewed the above documentation for accuracy and completeness, and I agree with the above. Brunetta Genera MD

## 2018-06-26 ENCOUNTER — Inpatient Hospital Stay: Payer: Medicaid Other | Attending: Hematology | Admitting: Hematology

## 2018-06-26 ENCOUNTER — Telehealth: Payer: Self-pay | Admitting: Hematology

## 2018-06-26 ENCOUNTER — Inpatient Hospital Stay: Payer: Medicaid Other

## 2018-06-26 VITALS — BP 137/90 | HR 72 | Temp 98.3°F | Resp 18 | Ht 64.0 in | Wt 250.1 lb

## 2018-06-26 DIAGNOSIS — D509 Iron deficiency anemia, unspecified: Secondary | ICD-10-CM | POA: Diagnosis not present

## 2018-06-26 DIAGNOSIS — K589 Irritable bowel syndrome without diarrhea: Secondary | ICD-10-CM | POA: Diagnosis not present

## 2018-06-26 DIAGNOSIS — D500 Iron deficiency anemia secondary to blood loss (chronic): Secondary | ICD-10-CM

## 2018-06-26 DIAGNOSIS — N920 Excessive and frequent menstruation with regular cycle: Secondary | ICD-10-CM | POA: Insufficient documentation

## 2018-06-26 DIAGNOSIS — Z800 Family history of malignant neoplasm of digestive organs: Secondary | ICD-10-CM | POA: Diagnosis not present

## 2018-06-26 DIAGNOSIS — R531 Weakness: Secondary | ICD-10-CM

## 2018-06-26 LAB — CBC WITH DIFFERENTIAL/PLATELET
Basophils Absolute: 0 10*3/uL (ref 0.0–0.1)
Basophils Relative: 0 %
Eosinophils Absolute: 0.2 10*3/uL (ref 0.0–0.5)
Eosinophils Relative: 3 %
HCT: 34.8 % (ref 34.8–46.6)
Hemoglobin: 11.1 g/dL — ABNORMAL LOW (ref 11.6–15.9)
Lymphocytes Relative: 39 %
Lymphs Abs: 2.4 10*3/uL (ref 0.9–3.3)
MCH: 28.7 pg (ref 25.1–34.0)
MCHC: 31.9 g/dL (ref 31.5–36.0)
MCV: 89.9 fL (ref 79.5–101.0)
Monocytes Absolute: 0.5 10*3/uL (ref 0.1–0.9)
Monocytes Relative: 8 %
Neutro Abs: 3.1 10*3/uL (ref 1.5–6.5)
Neutrophils Relative %: 50 %
Platelets: 341 10*3/uL (ref 145–400)
RBC: 3.87 MIL/uL (ref 3.70–5.45)
RDW: 13.3 % (ref 11.2–14.5)
WBC: 6.3 10*3/uL (ref 3.9–10.3)

## 2018-06-26 LAB — IRON AND TIBC
Iron: 70 ug/dL (ref 41–142)
Saturation Ratios: 27 % (ref 21–57)
TIBC: 255 ug/dL (ref 236–444)
UIBC: 186 ug/dL

## 2018-06-26 LAB — FERRITIN: Ferritin: 307 ng/mL (ref 11–307)

## 2018-06-26 NOTE — Telephone Encounter (Signed)
Appts scheduled avs/calendar printed per 9/18 los °

## 2018-07-08 ENCOUNTER — Other Ambulatory Visit (HOSPITAL_COMMUNITY)
Admission: RE | Admit: 2018-07-08 | Discharge: 2018-07-08 | Disposition: A | Discharge status: Home / Self Care | Payer: Medicaid Other | Source: Ambulatory Visit | Attending: Family Medicine | Admitting: Family Medicine

## 2018-07-08 ENCOUNTER — Encounter: Payer: Self-pay | Admitting: Family Medicine

## 2018-07-08 ENCOUNTER — Ambulatory Visit (INDEPENDENT_AMBULATORY_CARE_PROVIDER_SITE_OTHER): Payer: Medicaid Other | Admitting: Family Medicine

## 2018-07-08 VITALS — BP 136/84 | HR 82 | Wt 248.0 lb

## 2018-07-08 DIAGNOSIS — N920 Excessive and frequent menstruation with regular cycle: Secondary | ICD-10-CM

## 2018-07-08 DIAGNOSIS — Z124 Encounter for screening for malignant neoplasm of cervix: Secondary | ICD-10-CM | POA: Insufficient documentation

## 2018-07-08 DIAGNOSIS — R102 Pelvic and perineal pain: Secondary | ICD-10-CM

## 2018-07-08 DIAGNOSIS — Z01419 Encounter for gynecological examination (general) (routine) without abnormal findings: Secondary | ICD-10-CM | POA: Diagnosis not present

## 2018-07-08 MED ORDER — MEGESTROL ACETATE 40 MG PO TABS: 40.0000 mg | ORAL_TABLET | Freq: Two times a day (BID) | ORAL | 5 refills | Status: DC

## 2018-07-08 NOTE — Progress Notes (Signed)
   Subjective:    Patient ID: Nancy Dickerson, female    DOB: Jul 13, 1981, 37 y.o.   MRN: [id number]  HPI Patient referred for menometrorrhagia with regular cycle. Also has some intermittent pelvic pain. Had essure placed several years ago. Also had fibroid seen on [place] about 5 years ago (measured 1.6cm. Endometrium 1.6cm at that time. Heavy bleeding with menses that causes anemia and necessitated a blood transfusion. Pain minimally improved with NSAIDs, worse with movements. Pain occurs 3 times a week, usually lasting for a few moments.  Ob hx - 6 vaginal deliveries.  No h/o surgeries.  I have reviewed the patients past medical, family, and social history.  I have reviewed the patient's medication list and allergies.   Review of Systems  All other systems reviewed and are negative.      Objective:   Physical Exam  Constitutional: She appears well-developed and well-nourished.  Cardiovascular: Normal rate and regular rhythm.  Pulmonary/Chest: Effort normal and breath sounds normal.  Abdominal: Soft. A hernia is present. Hernia confirmed negative in the right inguinal area and confirmed negative in the left inguinal area.  Genitourinary: There is no rash, tenderness, lesion or injury on the right labia. There is no rash, tenderness or injury on the left labia. Uterus is tender. Uterus is not deviated, not enlarged and not fixed. Cervix exhibits no motion tenderness, no discharge and no friability. Right adnexum displays tenderness. Right adnexum displays no mass and no fullness. Left adnexum displays tenderness. Left adnexum displays no mass and no fullness. No erythema, tenderness or bleeding in the vagina. No foreign body in the vagina. No signs of injury around the vagina. No vaginal discharge found.  Lymphadenopathy: No inguinal adenopathy noted on the right or left side.      Assessment & Plan:  1. Menorrhagia with regular cycle Will start megace, particularly as endometrium high normal  on last [place]. Also discussed IUD vs ablation. Patient would prefer not to have surgery. Will order [place] to evaluate for enlarging fibroids. Check CBC, TSH, HgA1c. F/u with gyn surgeon to discuss options. - US Transvaginal Non-OB; Future - CBC - TSH - Hemoglobin A1c  2. Cervical cancer screening - Cytology - PAP  3. Pelvic Pain Likely secondary to essure. Discussed that if she wants Essure removed, then hysterectomy is the only way to do that.

## 2018-07-09 LAB — HEMOGLOBIN A1C
Est. average glucose Bld gHb Est-mCnc: 105 mg/dL
Hgb A1c MFr Bld: 5.3 % (ref 4.8–5.6)

## 2018-07-09 LAB — CBC
Hematocrit: 35.7 % (ref 34.0–46.6)
Hemoglobin: 11.4 g/dL (ref 11.1–15.9)
MCH: 28.7 pg (ref 26.6–33.0)
MCHC: 31.9 g/dL (ref 31.5–35.7)
MCV: 90 fL (ref 79–97)
Platelets: 336 10*3/uL (ref 150–450)
RBC: 3.97 x10E6/uL (ref 3.77–5.28)
RDW: 13.2 % (ref 12.3–15.4)
WBC: 7 10*3/uL (ref 3.4–10.8)

## 2018-07-09 LAB — TSH: TSH: 0.768 u[IU]/mL (ref 0.450–4.500)

## 2018-07-10 LAB — CYTOLOGY - PAP
Bacterial vaginitis: POSITIVE — AB
Candida vaginitis: NEGATIVE
Chlamydia: NEGATIVE
HPV: DETECTED — AB
Neisseria Gonorrhea: NEGATIVE
Trichomonas: POSITIVE — AB

## 2018-07-11 ENCOUNTER — Other Ambulatory Visit: Payer: Self-pay | Admitting: Family Medicine

## 2018-07-11 MED ORDER — METRONIDAZOLE 500 MG PO TABS: 500.0000 mg | ORAL_TABLET | Freq: Two times a day (BID) | ORAL | 0 refills | Status: DC

## 2018-07-12 ENCOUNTER — Ambulatory Visit (HOSPITAL_COMMUNITY): Payer: Medicaid Other | Attending: Family Medicine

## 2018-07-22 ENCOUNTER — Encounter: Payer: Self-pay | Admitting: Family Medicine

## 2018-07-22 ENCOUNTER — Ambulatory Visit: Payer: Medicaid Other | Admitting: Family Medicine

## 2018-07-22 NOTE — Progress Notes (Signed)
Patient did not keep appointment today. She will be called to reschedule.  

## 2018-10-25 ENCOUNTER — Inpatient Hospital Stay: Payer: Medicaid Other | Admitting: Hematology

## 2018-10-25 ENCOUNTER — Inpatient Hospital Stay: Payer: Medicaid Other | Attending: Hematology

## 2018-11-28 ENCOUNTER — Encounter: Payer: Self-pay | Admitting: Physician Assistant

## 2018-11-28 ENCOUNTER — Telehealth (INDEPENDENT_AMBULATORY_CARE_PROVIDER_SITE_OTHER): Payer: Self-pay | Admitting: Physician Assistant

## 2018-11-28 NOTE — Telephone Encounter (Signed)
Pt called stating that the letter for her return to work - her job lost it.   she was wondering If she can come in next week and pick up a new letter

## 2018-11-29 NOTE — Telephone Encounter (Signed)
IC LM advising could pick up at front desk.

## 2018-12-04 ENCOUNTER — Ambulatory Visit: Payer: Medicaid Other

## 2018-12-11 ENCOUNTER — Encounter: Payer: Self-pay | Admitting: Internal Medicine

## 2018-12-12 ENCOUNTER — Ambulatory Visit: Payer: Medicaid Other

## 2018-12-12 ENCOUNTER — Encounter: Payer: Self-pay | Admitting: Internal Medicine

## 2018-12-18 ENCOUNTER — Other Ambulatory Visit: Payer: Self-pay

## 2018-12-18 ENCOUNTER — Encounter (HOSPITAL_COMMUNITY): Payer: Self-pay

## 2018-12-18 ENCOUNTER — Emergency Department (HOSPITAL_COMMUNITY)
Admission: AD | Admit: 2018-12-18 | Discharge: 2018-12-19 | Discharge status: Against Medical Advice | Payer: Medicaid Other | Source: Ambulatory Visit | Attending: Emergency Medicine | Admitting: Emergency Medicine

## 2018-12-18 ENCOUNTER — Emergency Department (HOSPITAL_COMMUNITY)
Admission: EM | Admit: 2018-12-18 | Discharge: 2018-12-18 | Discharge status: Against Medical Advice | Payer: Medicaid Other

## 2018-12-18 DIAGNOSIS — Z5321 Procedure and treatment not carried out due to patient leaving prior to being seen by health care provider: Secondary | ICD-10-CM | POA: Insufficient documentation

## 2018-12-18 DIAGNOSIS — N939 Abnormal uterine and vaginal bleeding, unspecified: Secondary | ICD-10-CM

## 2018-12-18 DIAGNOSIS — Z3202 Encounter for pregnancy test, result negative: Secondary | ICD-10-CM

## 2018-12-18 LAB — POCT PREGNANCY, URINE: Preg Test, Ur: NEGATIVE

## 2018-12-18 NOTE — MAU Provider Note (Signed)
  S Ms. Nancy Dickerson is a 38 y.o. [id number] non-pregnant female who presents to MAU today with complaint of VB.   O BP 134/79 (BP Location: Left Arm)   Pulse 95   Temp 98 F (36.7 C) (Oral)   Resp 18   Ht 5\' 4"  (1.626 m)   Wt 114.3 kg   LMP 10/04/2018 (Approximate)   SpO2 100%   BMI 43.26 kg/m  Physical Exam  A Non pregnant female Medical screening exam complete   P Transfer to [facility] in stable condition Patient may return to MAU as needed for pregnancy related complaints  Julianne Handler, CNM 12/18/2018 11:20 PM

## 2018-12-18 NOTE — ED Triage Notes (Signed)
Pt called from triage with no answer 

## 2018-12-18 NOTE — MAU Note (Signed)
Last LMP 11/04/2018. No period in February, is usually regular. March 1 started period, usually lasts 7 days, but has been bleeding since. Tonight she felt a gush of fluid- saw blood and lots of clots. Had no pain or cramping before that.  UPT- but lots of blood/clots mixed w/sample.

## 2018-12-18 NOTE — ED Notes (Signed)
Pt told Nancy Butts, RN she is going to [facility] to be seen because she doesn't want to wait here.

## 2018-12-18 NOTE — MAU Note (Signed)
Transported patient to [facility] ED registration.

## 2018-12-18 NOTE — ED Triage Notes (Signed)
[facility] Immediately demanding to speak to charge nurse because "this is the third hospital she has been to" and no one is "caring for her" Asked pt why she left [facility], she states they told her to leave. Per the note pt chose to leave on her own to [facility] due to wait time. Pt states they sent here there and she refuses to be called a liar. Refused to let me finish her triage until charge speaks to her. Told pt she can sit in lobby until charge RN is available, she is currently busy.

## 2018-12-19 LAB — URINALYSIS, MICROSCOPIC (REFLEX)
RBC / HPF: >50 RBC/hpf (ref 0–5)
WBC, UA: >50 WBC/hpf (ref 0–5)

## 2018-12-19 LAB — URINALYSIS, ROUTINE W REFLEX MICROSCOPIC
Glucose, UA: 100 mg/dL — AB
Ketones, ur: 15 mg/dL — AB
Nitrite: POSITIVE — AB
Protein, ur: >300 mg/dL — AB
Specific Gravity, Urine: 1.015 (ref 1.005–1.030)
pH: 7.5 (ref 5.0–8.0)

## 2018-12-19 NOTE — ED Triage Notes (Signed)
Pt taken back to triage room for Charge to speak with her

## 2018-12-19 NOTE — ED Notes (Signed)
Vaginal bleeding since beginning of March.

## 2018-12-19 NOTE — ED Notes (Addendum)
Pt sent to [facility] ED from [facility]U.  Pt states she was at [facility] and asked to speak to charge RN.  States charge RN told her it was a 7 hour wait and that she wasn't supposed to tell her but that it would be best for her to go to [facility] for her vaginal bleeding.  Pt went to [facility] and was sent to [facility] because of negative preg test.  Per chart, pt told [facility] that [facility] was going [facility].  Pt feels that she should go straight to room due to her symptoms. Explained triage process and apologized that we don't have a room for pt to go directly to.  Informed her that I will triage her, draw protocol labs, and as long as her vitals are still stable she will go to waiting room.  Explained if anything comes back critical it would bump her acuity up and she would go back sooner.  Encouraged pt to stay to be seen.  Pt declined.

## 2018-12-19 NOTE — ED Notes (Signed)
Pt registration desk demanding to speak to charge. Santiago Glad RN is already aware.

## 2019-01-08 ENCOUNTER — Encounter: Payer: Self-pay | Admitting: Internal Medicine

## 2019-01-09 NOTE — Telephone Encounter (Signed)
Patient mychart concern.

## 2019-01-10 ENCOUNTER — Ambulatory Visit: Payer: Self-pay | Attending: Internal Medicine | Admitting: Internal Medicine

## 2019-01-10 ENCOUNTER — Other Ambulatory Visit: Payer: Self-pay

## 2019-01-10 ENCOUNTER — Encounter: Payer: Self-pay | Admitting: Internal Medicine

## 2019-01-10 VITALS — BP 130/87 | HR 86 | Temp 98.1°F | Resp 16 | Wt 253.8 lb

## 2019-01-10 DIAGNOSIS — D259 Leiomyoma of uterus, unspecified: Secondary | ICD-10-CM

## 2019-01-10 DIAGNOSIS — D500 Iron deficiency anemia secondary to blood loss (chronic): Secondary | ICD-10-CM

## 2019-01-10 DIAGNOSIS — N938 Other specified abnormal uterine and vaginal bleeding: Secondary | ICD-10-CM

## 2019-01-10 MED ORDER — ONDANSETRON HCL 4 MG PO TABS: 4.0000 mg | ORAL_TABLET | Freq: Two times a day (BID) | ORAL | 0 refills | Status: DC | PRN

## 2019-01-10 MED ORDER — MEGESTROL ACETATE 40 MG PO TABS: 40.0000 mg | ORAL_TABLET | Freq: Two times a day (BID) | ORAL | 5 refills | Status: DC

## 2019-01-10 NOTE — Progress Notes (Signed)
Patient ID: Nancy Dickerson, female    DOB: 1981/06/02  MRN: [id number]  CC: Vaginal Bleeding   Subjective: Nancy Dickerson is a 38 y.o. female who presents for UC visit. Her concerns today include:  Patient with history of iron deficiency anemia and uterine fibroids.  "I've been bleeding since 12/08/2018." having to change large pad Q 1-2 hr.  Lots of clots. No cramps. Stopped for 1-2 days around 12/28/2018 then started back heavy again. Menses usually last 7 days with heavy bleeding starting 2nd-3rd day.  Had a normal period in January but no menses in February.  She has Essure.  Endorses feeling tired a lot.  Dizziness occasionally.  Notice bruises sometimes on extremities. No present today. Reports having hemorrhage during delivery of 1 of her children.  No known history of bleeding disorder.  No family history of bleeding disorder.  Denies any bleeding gums, epistaxis or blood in his stools. Seen at [facility] 12/18/2018 for the same.  Patient states that he did a pregnancy test that was negative and was told to be seen in the emergency room.  Patient left due to prolonged wait.  Subsequently seen in the emergency room on 12/23/2018 at [facility] in [place].  CBC revealed hemoglobin of 9.3 down from 11 in September of last year, with hematocrit of 29.5.  Platelet count was normal.  Chemistry looked okay.  INR was normal.  Pregnancy test negative.  Pelvic ultrasound revealed intramural uterine fibroid.  The endometrium is reported as thicker than typically seen in a patient this age.  GYN evaluation was suggested.  Patient has known history of iron deficiency anemia thought to be due to menorrhagia.  Reports that oral iron has never worked for her as it never seem to be absorbed well.  She has received iron infusions in the past because of this.  She had seen Dr. Irene Limbo last year and was given iron transfusion.  She had also seen gynecology September and was placed on Megace and discussion was  had about placement of an IUD versus uterine ablation.  The plan at that time was to get an ultrasound and patient was to follow-up in October.  However she no showed that visit due to loss of insurance. Patient states that she took Megace for short period because it made her nauseated and she was concerned about weight gain.  She is currently uninsured and is frustrated with the ongoing vaginal bleeding.  She states that she has never had an episode of bleeding that has lasted this long.  Patient Active Problem List   Diagnosis Date Noted  . Uterine leiomyoma 03/12/2018  . Trochanteric bursitis of right hip 03/12/2018  . Class 3 severe obesity due to excess calories without serious comorbidity with body mass index (BMI) of 40.0 to 44.9 in adult ([place]) 03/12/2018  . Iron deficiency anemia due to chronic blood loss 01/29/2018  . Hypokalemia 01/29/2018  . Right sided weakness 01/29/2018     Current Outpatient Medications on File Prior to Visit  Medication Sig Dispense Refill  . metroNIDAZOLE (FLAGYL) 500 MG tablet Take 1 tablet (500 mg total) by mouth 2 (two) times daily. (Patient not taking: Reported on 01/10/2019) 14 tablet 0   No current facility-administered medications on file prior to visit.     No Known Allergies  Social History   Socioeconomic History  . Marital status: Single    Spouse name: Not on file  . Number of children: Not on file  .  Years of education: Not on file  . Highest education level: Not on file  Occupational History  . Not on file  Social Needs  . Financial resource strain: Not on file  . Food insecurity:    Worry: Not on file    Inability: Not on file  . Transportation needs:    Medical: Not on file    Non-medical: Not on file  Tobacco Use  . Smoking status: Never Smoker  . Smokeless tobacco: Never Used  Substance and Sexual Activity  . Alcohol use: Yes    Comment: rare  . Drug use: Yes    Types: Marijuana  . Sexual activity: Not on file   Lifestyle  . Physical activity:    Days per week: Not on file    Minutes per session: Not on file  . Stress: Not on file  Relationships  . Social connections:    Talks on phone: Not on file    Gets together: Not on file    Attends religious service: Not on file    Active member of club or organization: Not on file    Attends meetings of clubs or organizations: Not on file    Relationship status: Not on file  . Intimate partner violence:    Fear of current or ex partner: Not on file    Emotionally abused: Not on file    Physically abused: Not on file    Forced sexual activity: Not on file  Other Topics Concern  . Not on file  Social History Narrative   ** Merged History Encounter **        No family history on file.  Past Surgical History:  Procedure Laterality Date  . NO PAST SURGERIES      ROS: Review of Systems Negative except as stated above  PHYSICAL EXAM: BP 130/87   Pulse 86   Temp 98.1 F (36.7 C) (Oral)   Resp 16   Wt 253 lb 12.8 oz (115.1 kg)   SpO2 100%   BMI 43.56 kg/m   Physical Exam  General appearance - alert, well appearing, young obese African-American female and in no distress Mental status - normal mood, behavior, speech, dress, motor activity, and thought processes Chest - clear to auscultation, no wheezes, rales or rhonchi, symmetric air entry Heart - normal rate, regular rhythm, normal S1, S2, no murmurs, rubs, clicks or gallops   CMP Latest Ref Rng & Units 03/26/2018 02/07/2018 01/30/2018  Glucose 70 - 140 mg/dL 93 89 98  BUN 7 - 26 mg/dL 11 13 7   Creatinine 0.60 - 1.10 mg/dL 0.81 0.76 0.70  Sodium 136 - 145 mmol/L 137 139 138  Potassium 3.5 - 5.1 mmol/L 4.1 4.2 3.6  Chloride 98 - 109 mmol/L 101 102 108  CO2 22 - 29 mmol/L 27 24 24   Calcium 8.4 - 10.4 mg/dL 10.0 9.3 8.5(L)  Total Protein 6.4 - 8.3 g/dL 8.5(H) - -  Total Bilirubin 0.2 - 1.2 mg/dL 0.4 - -  Alkaline Phos 40 - 150 U/L 57 - -  AST 5 - 34 U/L 16 - -  ALT 0 - 55 U/L 20 -  -   Lipid Panel     Component Value Date/Time   CHOL 115 01/30/2018 0355   TRIG 89 01/30/2018 0355   HDL 39 (L) 01/30/2018 0355   CHOLHDL 2.9 01/30/2018 0355   VLDL 18 01/30/2018 0355   LDLCALC 58 01/30/2018 0355    CBC    Component Value  Date/Time   WBC 7.0 07/08/2018 1042   WBC 6.3 06/26/2018 0900   RBC 3.97 07/08/2018 1042   RBC 3.87 06/26/2018 0900   HGB 11.4 07/08/2018 1042   HCT 35.7 07/08/2018 1042   PLT 336 07/08/2018 1042   MCV 90 07/08/2018 1042   MCV 74 (L) 10/25/2014 1428   MCH 28.7 07/08/2018 1042   MCH 28.7 06/26/2018 0900   MCHC 31.9 07/08/2018 1042   MCHC 31.9 06/26/2018 0900   RDW 13.2 07/08/2018 1042   RDW 18.9 (H) 10/25/2014 1428   LYMPHSABS 2.4 06/26/2018 0900   LYMPHSABS 2.3 02/07/2018 1625   LYMPHSABS 2.6 10/25/2014 1428   MONOABS 0.5 06/26/2018 0900   MONOABS 0.5 10/25/2014 1428   EOSABS 0.2 06/26/2018 0900   EOSABS 0.2 02/07/2018 1625   EOSABS 0.2 10/25/2014 1428   BASOSABS 0.0 06/26/2018 0900   BASOSABS 0.0 02/07/2018 1625   BASOSABS 0.1 10/25/2014 1428    ASSESSMENT AND PLAN: 1. DUB (dysfunctional uterine bleeding) 2. Uterine leiomyoma, unspecified location -Patient needs to see gynecology and may need to see hematology again but she is currently uninsured.  Advised her about applying for the orange card/cone discount. In the meantime in terms of trying to stop or slow down the bleeding, I recommend that she reconsider Megace or birth control pills.  She does not want to be placed on birth control pills but is willing to try Megace again.  I told her that I cannot prescribe some Phenergan or Zofran for her to use as needed if she does develop some nausea from the Megace -In regards to the reported easy bruising and hemorrhage during 1 of her deliveries, this can raise concern for ? bleeding disorder but I think current issue is really due to the fibroids.  I did not see the PTT results from the ER so I will go ahead and order that.  The INR  was reported as normal.  We will try to get him back with Dr. Irene Limbo when she has the orange card or cone discount. - CBC - APTT - megestrol (MEGACE) 40 MG tablet; Take 1 tablet (40 mg total) by mouth 2 (two) times daily.  Dispense: 60 tablet; Refill: 5 - Ambulatory referral to Gynecology  3. Iron deficiency anemia due to chronic blood loss    Patient was given the opportunity to ask questions.  Patient verbalized understanding of the plan and was able to repeat key elements of the plan.   Orders Placed This Encounter  Procedures  . CBC  . APTT  . Ambulatory referral to Gynecology     Requested Prescriptions   Signed Prescriptions Disp Refills  . megestrol (MEGACE) 40 MG tablet 60 tablet 5    Sig: Take 1 tablet (40 mg total) by mouth 2 (two) times daily.    Return if symptoms worsen or fail to improve.  Karle Plumber, MD, FACP

## 2019-01-10 NOTE — Patient Instructions (Signed)
Please get forms for the orange card on the cone discount card on your way out today. I will go ahead and submit a referral for you to see the gynecologist. In the meantime I will have you start Megace.

## 2019-01-11 LAB — CBC
Hematocrit: 31.6 % — ABNORMAL LOW (ref 34.0–46.6)
Hemoglobin: 9.8 g/dL — ABNORMAL LOW (ref 11.1–15.9)
MCH: 27.9 pg (ref 26.6–33.0)
MCHC: 31 g/dL — ABNORMAL LOW (ref 31.5–35.7)
MCV: 90 fL (ref 79–97)
Platelets: 363 10*3/uL (ref 150–450)
RBC: 3.51 x10E6/uL — ABNORMAL LOW (ref 3.77–5.28)
RDW: 13 % (ref 11.7–15.4)
WBC: 7 10*3/uL (ref 3.4–10.8)

## 2019-01-11 LAB — APTT: aPTT: 27 s (ref 24–33)

## 2019-01-16 NOTE — Telephone Encounter (Signed)
Please address patient concern.

## 2019-01-22 ENCOUNTER — Telehealth: Payer: Self-pay | Admitting: Internal Medicine

## 2019-01-22 NOTE — Telephone Encounter (Signed)
Contacted pt to make aware that letter is ready pt didn't answer lvm

## 2019-01-22 NOTE — Telephone Encounter (Signed)
-----   Message from Ladell Pier, MD sent at 01/17/2019 10:57 AM EDT ----- Regarding: needs letter for work.

## 2019-01-23 NOTE — Telephone Encounter (Signed)
Contacted pt and made aware that letter is ready for pickup at the front desk

## 2019-01-28 ENCOUNTER — Encounter: Payer: Self-pay | Admitting: Internal Medicine

## 2019-03-11 ENCOUNTER — Inpatient Hospital Stay: Admission: RE | Admit: 2019-03-11 | Payer: Medicaid Other | Source: Ambulatory Visit

## 2019-03-11 ENCOUNTER — Encounter: Payer: Self-pay | Admitting: Internal Medicine

## 2019-03-14 IMAGING — CT CT HEAD CODE STROKE
4 series · 16 of 47 positions shown, 18 images · non-contrast
Comparison: Head CT 01/07/2018

CLINICAL DATA: Code stroke.  Right-sided weakness and headache

EXAM:
CT HEAD WITHOUT CONTRAST
TECHNIQUE: Contiguous axial images were obtained from the base of the skull
through the vertex without intravenous contrast.

[Series 3: head wo · axial · 0.44mm/px · z∈[-72,+48]mm · 7 of 34 slices shown, 9 images]
[im 5/34  brain]
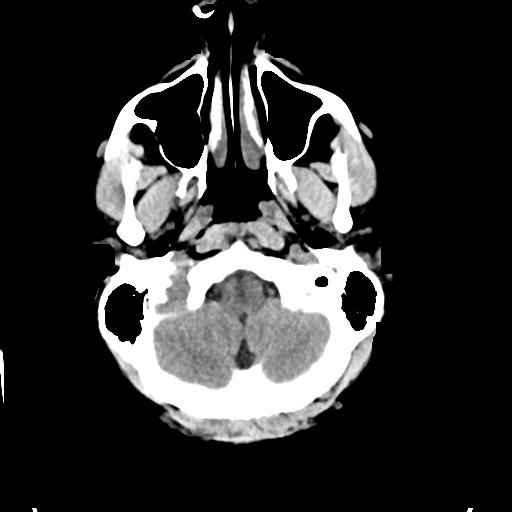
[im 5/34  bone]
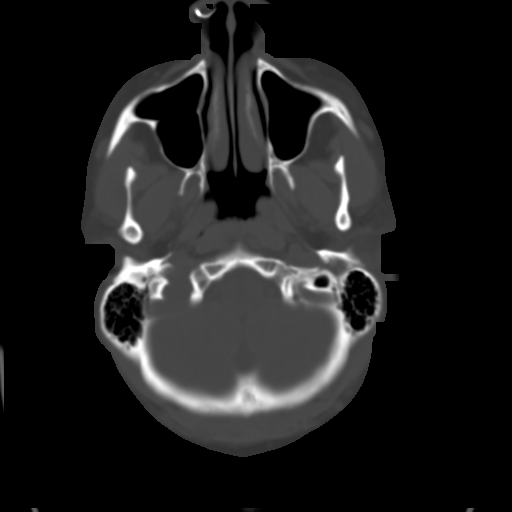
[im 9/34  brain]
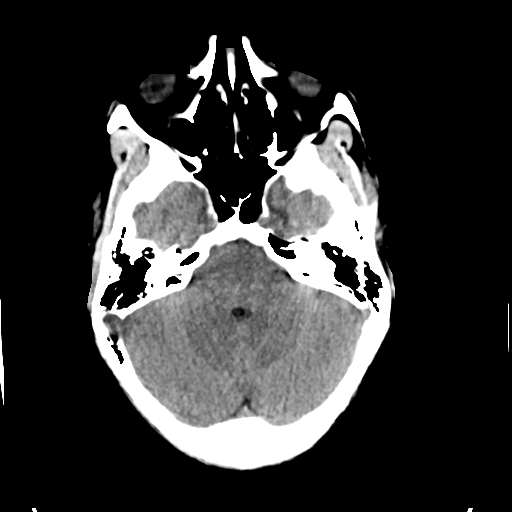
[im 13/34  brain]
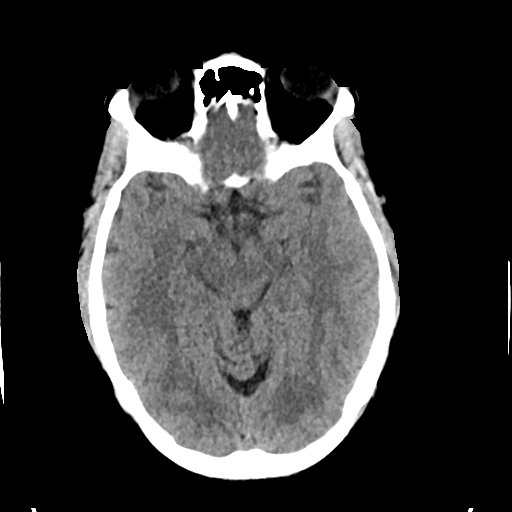
[im 17/34  brain]
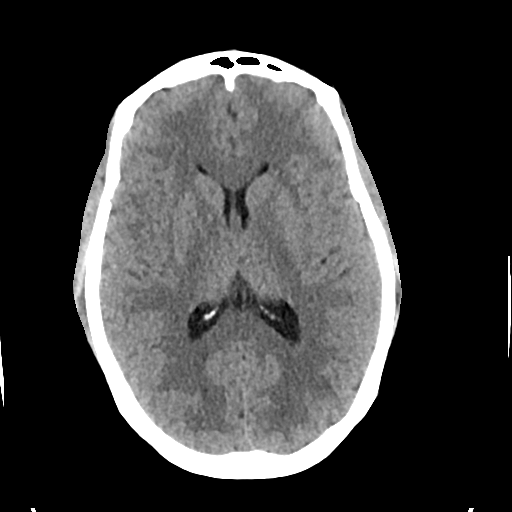
[im 21/34  brain]
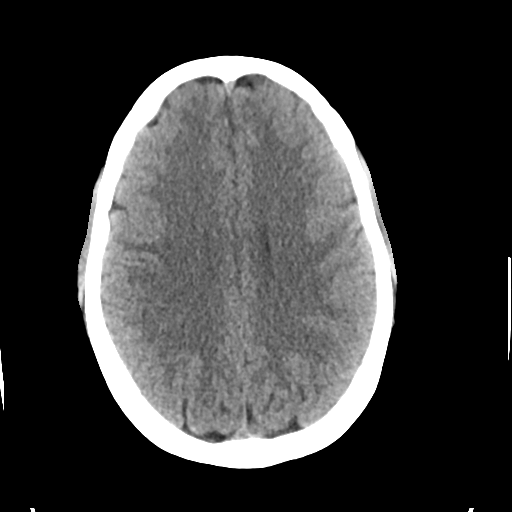
[im 21/34  bone]
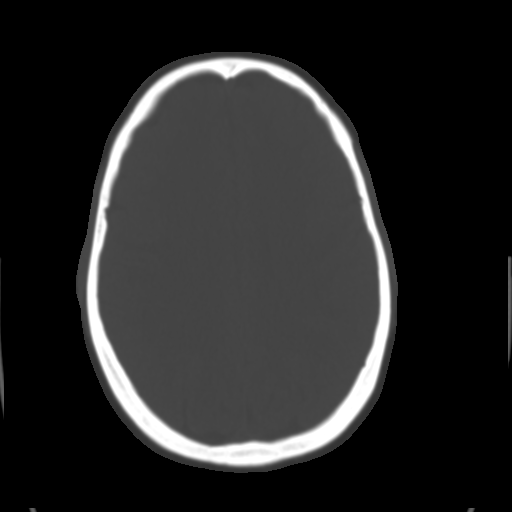
[im 25/34  brain]
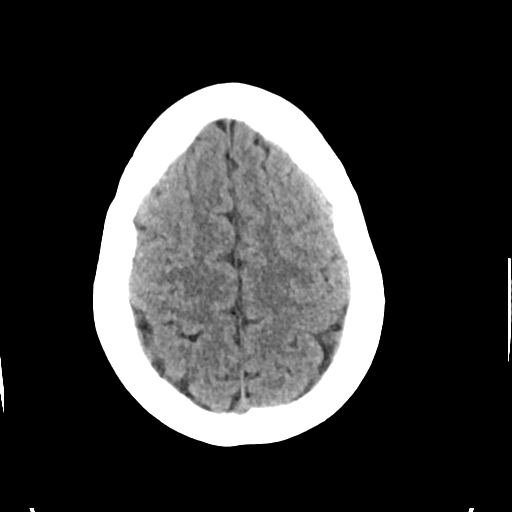
[im 29/34  brain]
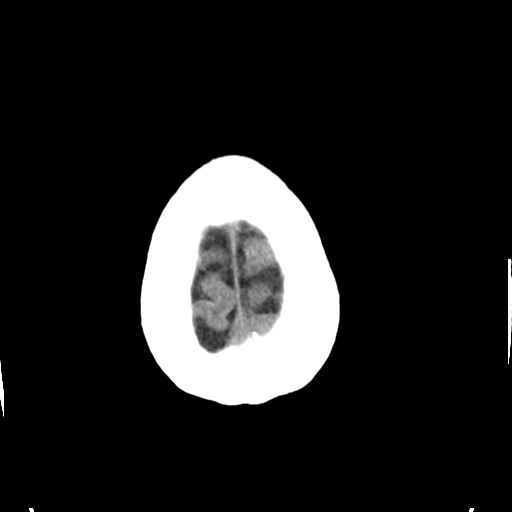

[Series 4: head bone · axial · 0.44mm/px · z∈[-76,-44]mm · 3 of 84 slices shown]
[im 9/84  bone]
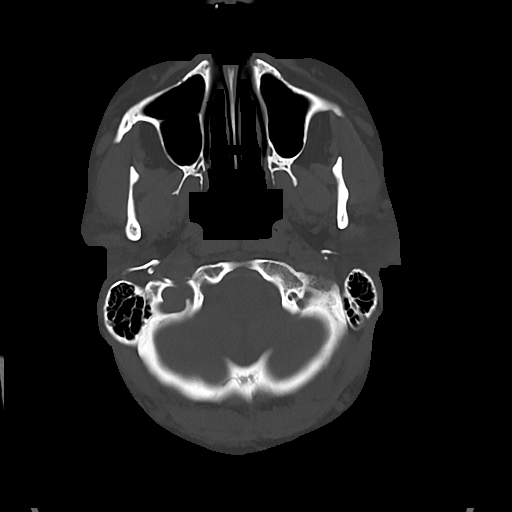
[im 17/84  bone]
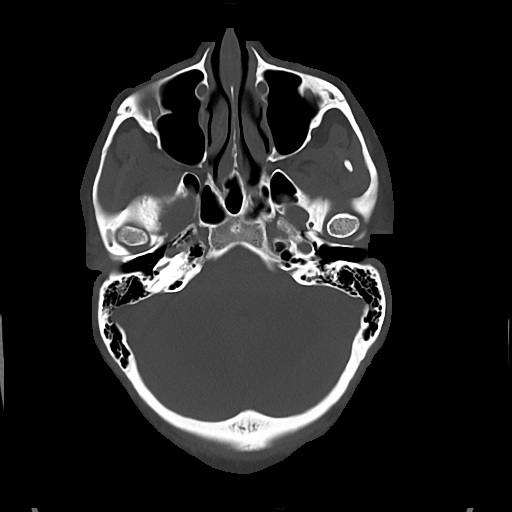
[im 25/84  bone]
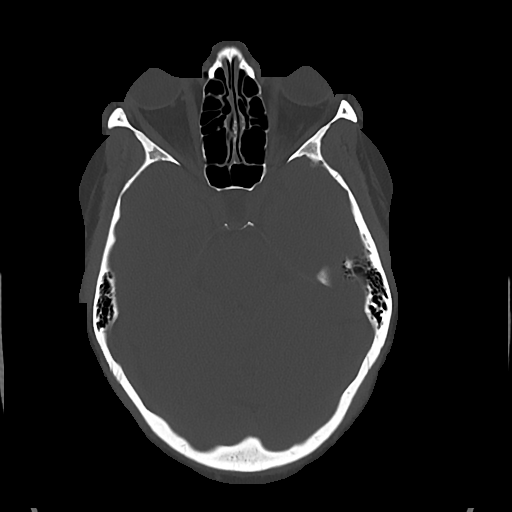

[Series 5: cor soft · coronal · 0.32mm/px · 3 of 72 slices shown]
[im 24/72  brain]
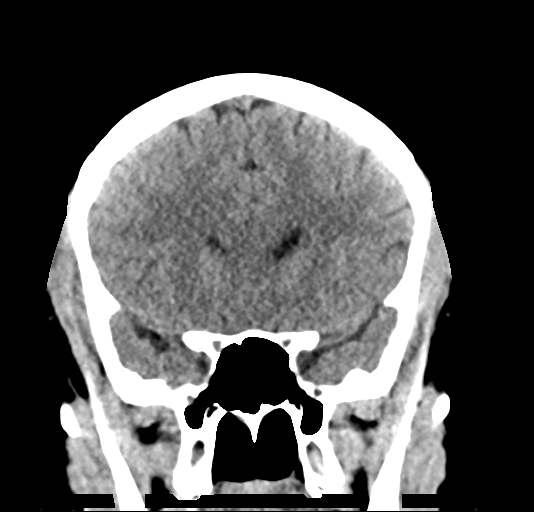
[im 32/72  brain]
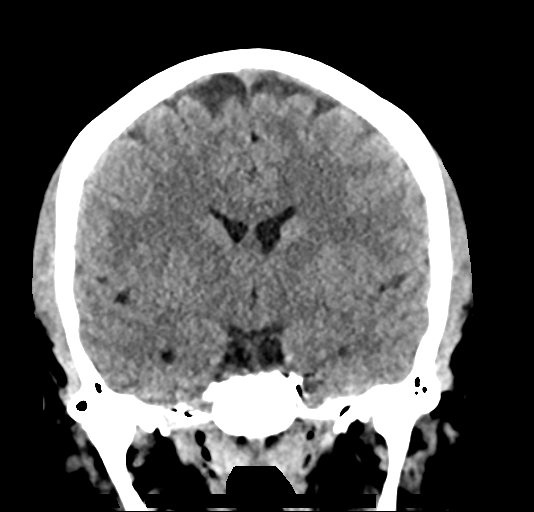
[im 40/72  brain]
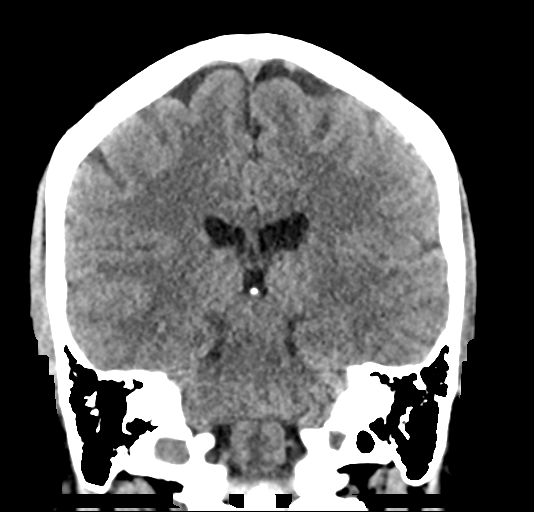

[Series 6: sag soft · sagittal · 0.32mm/px · 3 of 55 slices shown]
[im 19/55  brain]
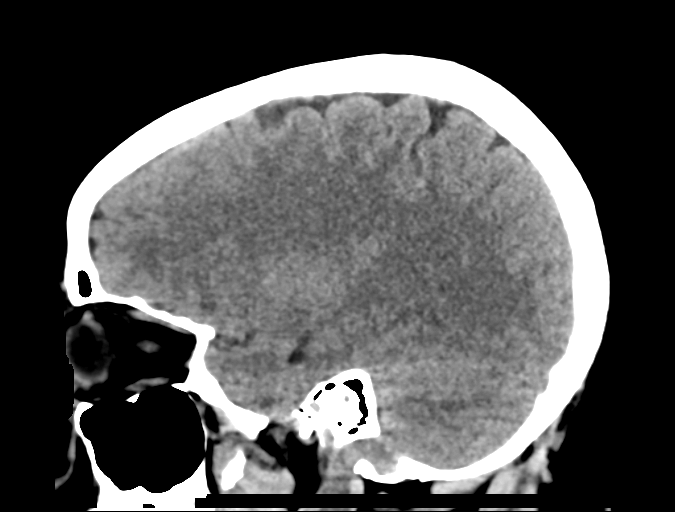
[im 28/55  brain]
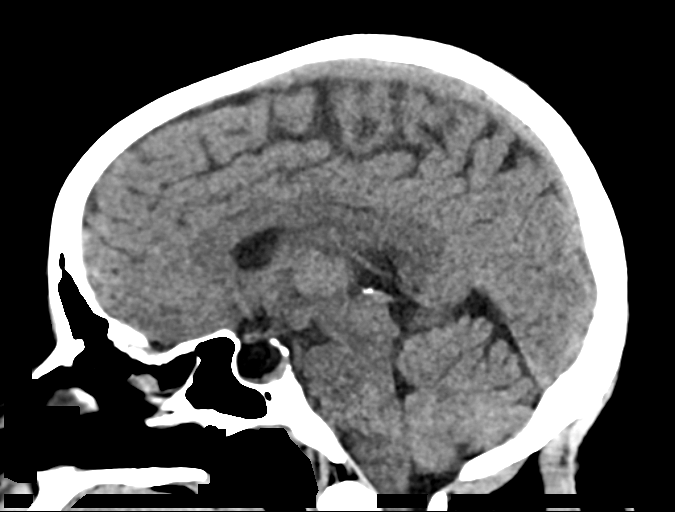
[im 37/55  brain]
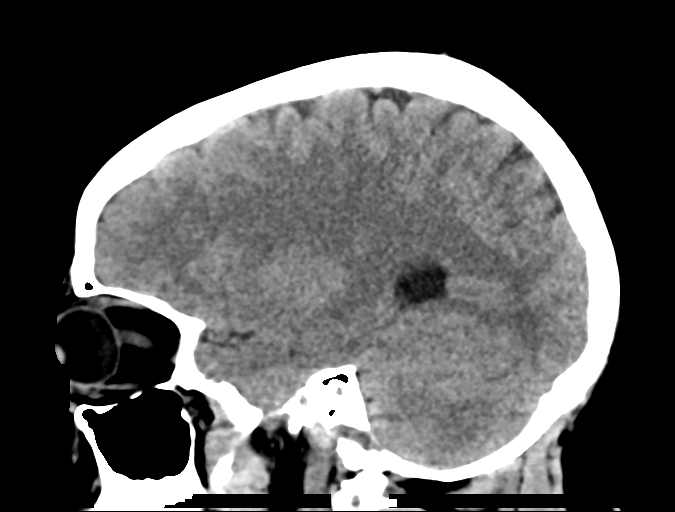

[16 of 47 positions shown; findings below may reference images not displayed]

FINDINGS: Brain: No mass lesion or acute hemorrhage. No focal hypoattenuation
of the basal ganglia or cortex to indicate infarcted tissue. No
hydrocephalus or age advanced atrophy.

Vascular: No hyperdense vessel. No advanced atherosclerotic
calcification of the arteries at the skull base.

Skull: Normal visualized skull base, calvarium and extracranial soft
tissues.

Sinuses/Orbits: No sinus fluid levels or advanced mucosal
thickening. No mastoid effusion. Normal orbits.

ASPECTS (Alberta Stroke Program Early CT Score)

- Ganglionic level infarction (caudate, lentiform nuclei, internal
capsule, insula, M1-M3 cortex): 7

- Supraganglionic infarction (M4-M6 cortex): 3

Total score (0-10 with 10 being normal): 10
IMPRESSION: 1. Normal head CT.
2. ASPECTS is 10.

These results were communicated to Dr. Soli Fleur at [DATE] on
01/29/2018 by text page via the [employer] messaging system.

## 2019-03-14 IMAGING — MR MR MRA HEAD W/O CM
9 of 11 series · 31 of 48 positions shown · non-contrast
Comparison: Head CT 01/29/2018

CLINICAL DATA: Dizziness with right-sided paresthesia and headache.

EXAM:
MRI HEAD WITHOUT CONTRAST
MRA HEAD WITHOUT CONTRAST
TECHNIQUE: Multiplanar, multiecho pulse sequences of the brain and surrounding
structures were obtained without intravenous contrast. Angiographic
images of the head were obtained using MRA technique without
contrast.

[Series 3: DWI · axial · 3.0mm · 0.94mm/px · z∈[-47,+102]mm · 7 of 104 slices shown (1 of 2)]
[im 1/104]
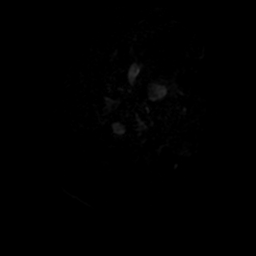
[im 18/104]
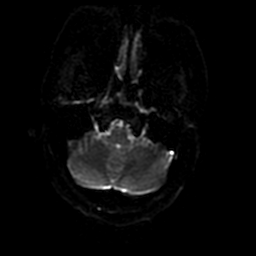
[im 35/104]
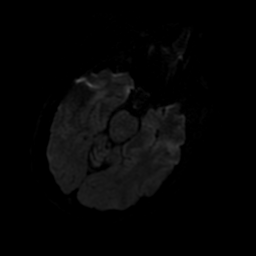
[im 52/104]
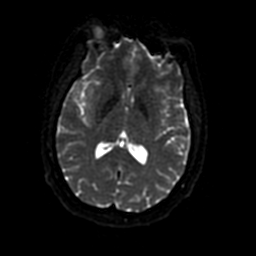
[im 69/104]
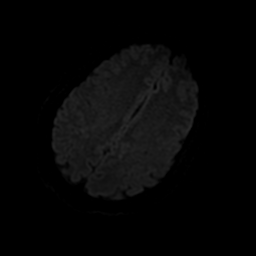
[im 86/104]
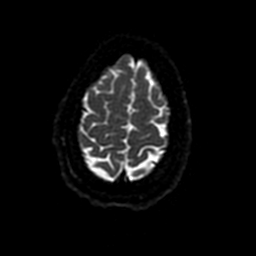
[im 104/104]
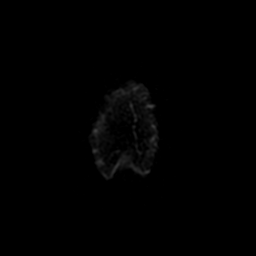

[Series 4: DWI · coronal · 4.0mm · 0.94mm/px · 5 of 80 slices shown (2 of 2)]
[im 1/80]
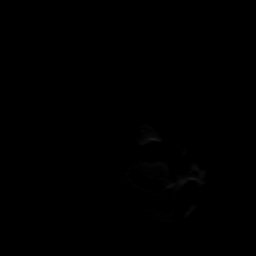
[im 20/80]
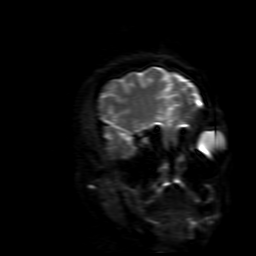
[im 40/80]
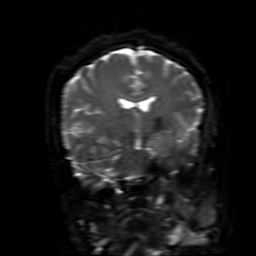
[im 60/80]
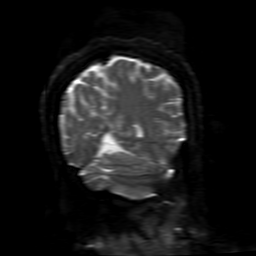
[im 80/80]
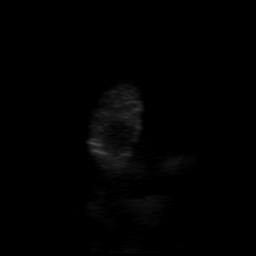

[Series 6: ax (id) 2 · axial · 1.0mm · 0.43mm/px · z∈[-27,+65]mm · 8 of 184 slices shown]
[im 1/184]
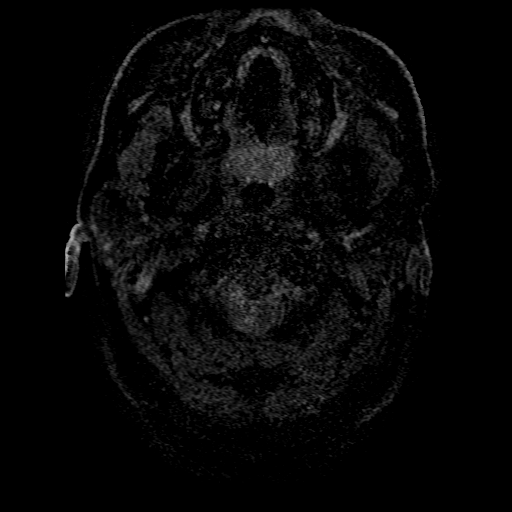
[im 37/184]
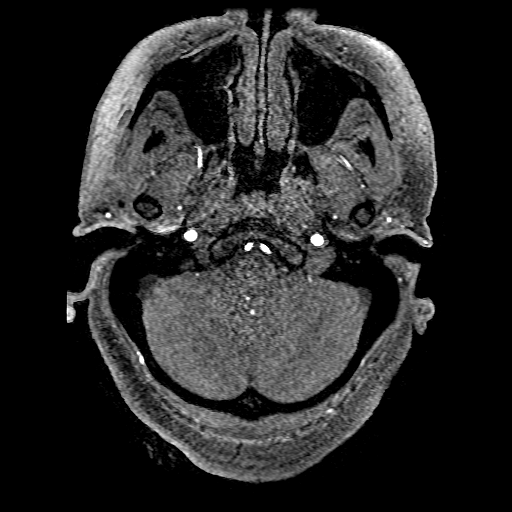
[im 55/184]
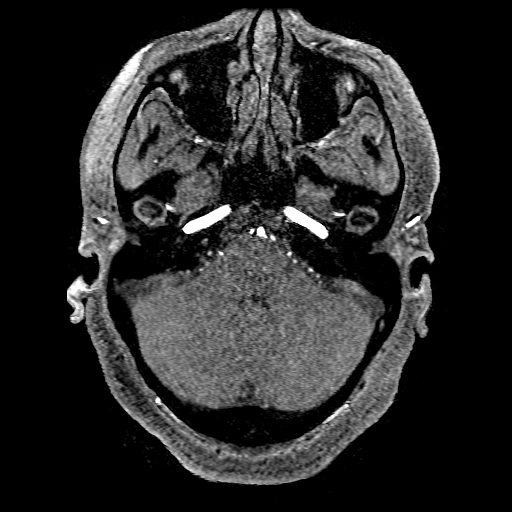
[im 74/184]
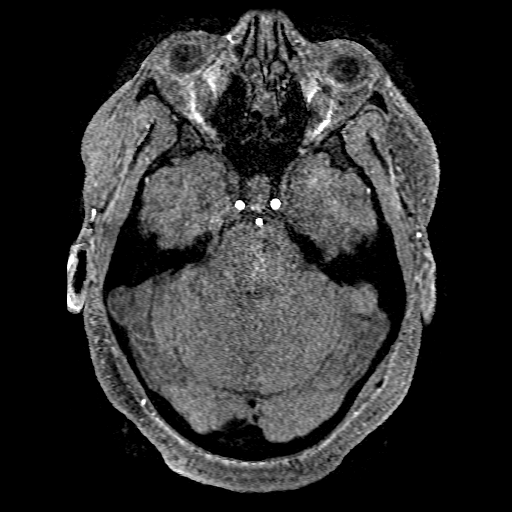
[im 110/184]
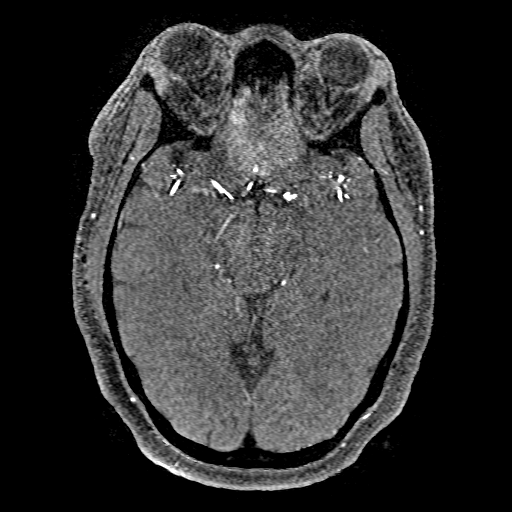
[im 129/184]
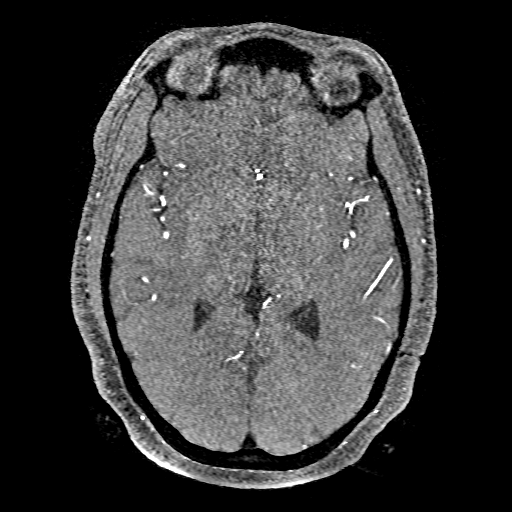
[im 147/184]
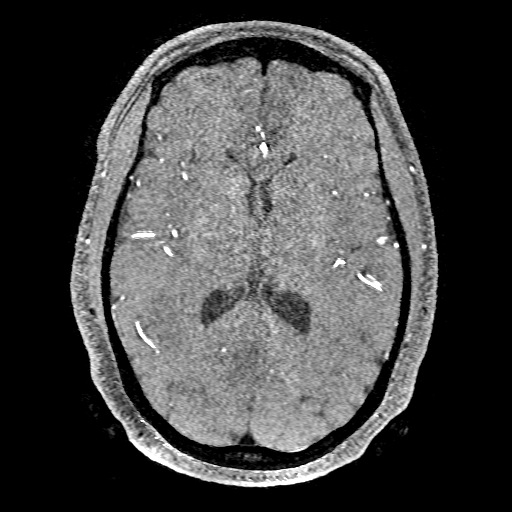
[im 184/184]
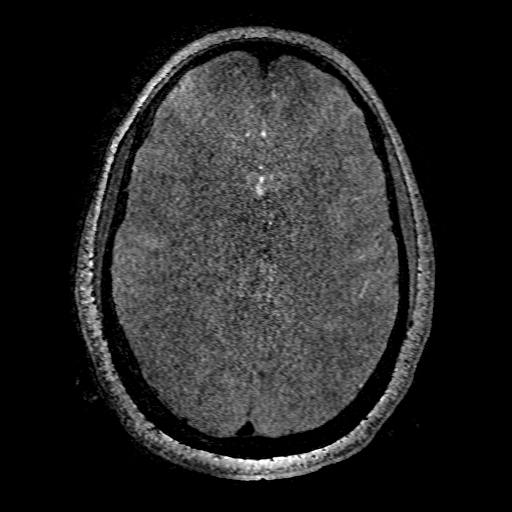

[Series 7: FLAIR · sagittal · 5.0mm · 0.47mm/px · 1 of 25 slices shown (1 of 2)]
[im 1/25]
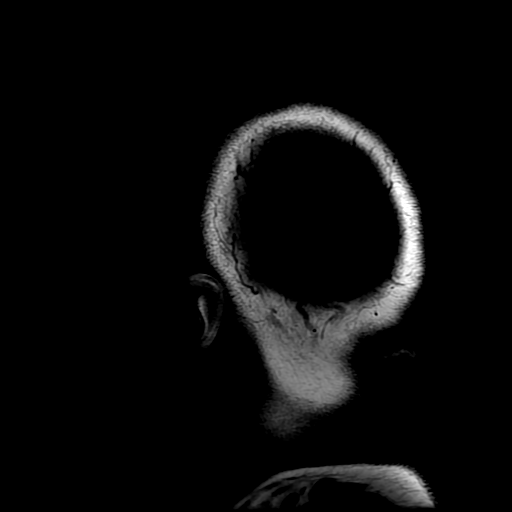

[Series 8: T2 · axial · 5.0mm · 0.47mm/px · z∈[-55,+106]mm · 2 of 28 slices shown]
[im 1/28]
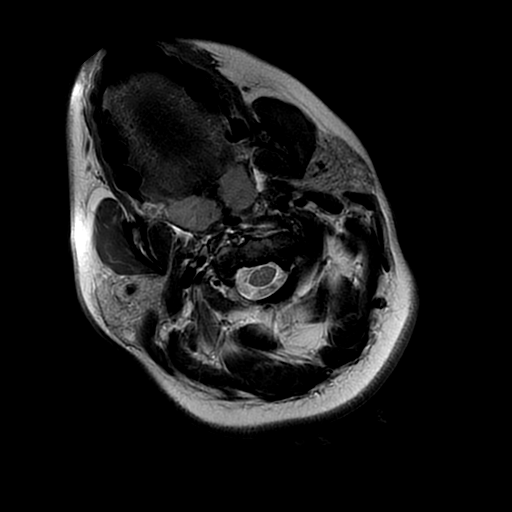
[im 28/28]
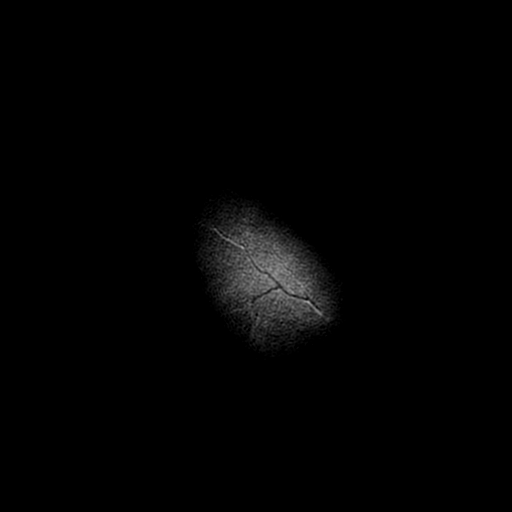

[Series 9: FLAIR · axial · 3.0mm · 0.45mm/px · z∈[-47,+108]mm · 2 of 27 slices shown (2 of 2)]
[im 1/27]
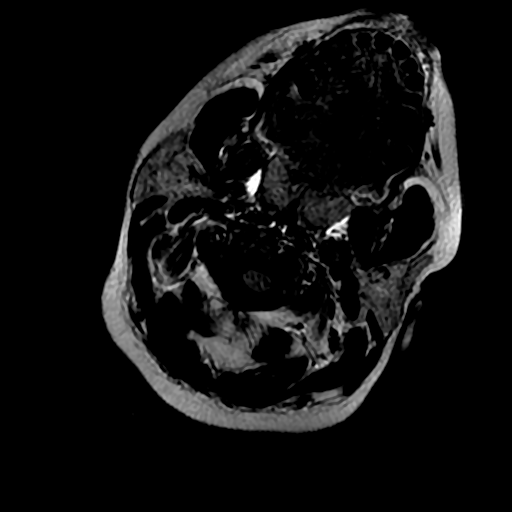
[im 27/27]
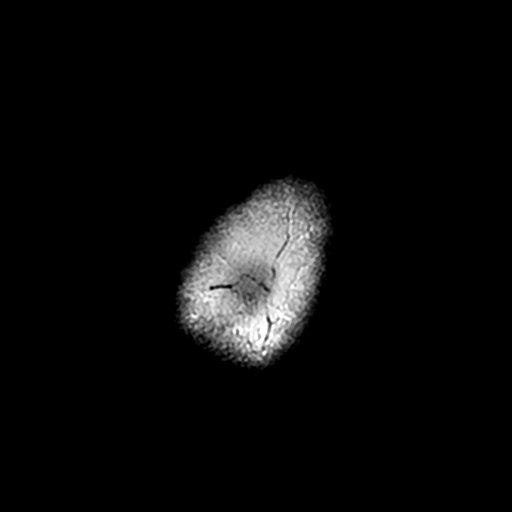

[Series 10: (person_name) · axial · 3.0mm · 0.47mm/px · 1 of 108 slices shown]
[im 1/108]
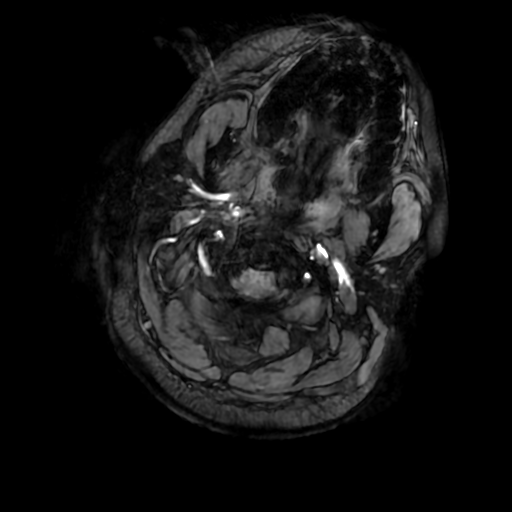

[Series 350: ADC · axial · 3.0mm · 0.94mm/px · z∈[-47,+102]mm · 3 of 52 slices shown (1 of 2)]
[im 1/52]
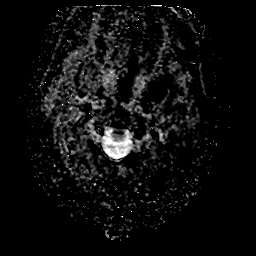
[im 26/52]
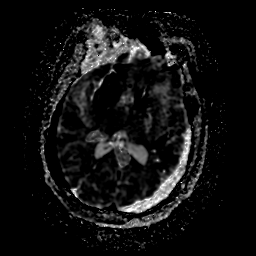
[im 52/52]
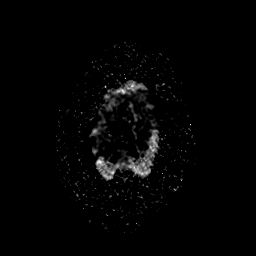

[Series 450: ADC · coronal · 4.0mm · 0.94mm/px · 2 of 40 slices shown (2 of 2)]
[im 1/40]
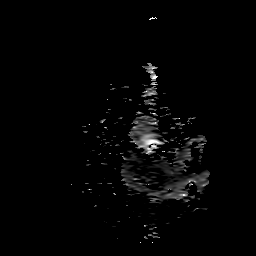
[im 40/40]
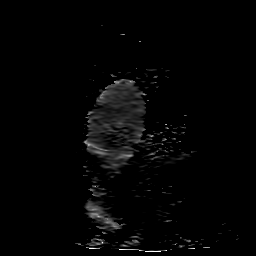

[31 of 48 positions shown; findings below may reference images not displayed]

FINDINGS: MRI HEAD FINDINGS

Brain: No focal diffusion restriction to indicate acute infarct. No
intraparenchymal hemorrhage. The brain parenchymal signal is normal.
No mass lesion. No chronic microhemorrhage or cerebral amyloid
angiopathy. No hydrocephalus, age advanced atrophy or lobar
predominant volume loss. No dural abnormality or extra-axial
collection.

Skull and upper cervical spine: The visualized skull base,
calvarium, upper cervical spine and extracranial soft tissues are
normal.

Sinuses/Orbits: No fluid levels or advanced mucosal thickening. No
mastoid effusion. Normal orbits.

MRA HEAD FINDINGS

Intracranial internal carotid arteries: Normal.

Anterior cerebral arteries: Normal.

Middle cerebral arteries: Normal.

Posterior communicating arteries: Present bilaterally.

Posterior cerebral arteries: Normal.

Basilar artery: Normal.

Vertebral arteries: Codominant normal.

Superior cerebellar arteries: Normal.

Anterior inferior cerebellar arteries: Normal.

Posterior inferior cerebellar arteries: Normal.
IMPRESSION: Normal MRI/MRA of the brain.

## 2019-03-28 ENCOUNTER — Encounter: Payer: Self-pay | Admitting: Internal Medicine

## 2019-04-07 ENCOUNTER — Other Ambulatory Visit: Payer: Self-pay

## 2019-04-07 ENCOUNTER — Telehealth (INDEPENDENT_AMBULATORY_CARE_PROVIDER_SITE_OTHER): Payer: Self-pay | Admitting: Family Medicine

## 2019-04-07 DIAGNOSIS — N938 Other specified abnormal uterine and vaginal bleeding: Secondary | ICD-10-CM

## 2019-04-07 MED ORDER — MEGESTROL ACETATE 40 MG PO TABS: 80.0000 mg | ORAL_TABLET | Freq: Two times a day (BID) | ORAL | 5 refills | Status: DC

## 2019-04-07 NOTE — Progress Notes (Signed)
   TELEHEALTH VIRTUAL GYNECOLOGY VISIT ENCOUNTER NOTE  I connected with Nancy Dickerson on 04/07/19 at  3:55 PM EDT by telephone at home and verified that I am speaking with the correct person using two identifiers.   I discussed the limitations, risks, security and privacy concerns of performing an evaluation and management service by telephone and the availability of in person appointments. I also discussed with the patient that there may be a patient responsible charge related to this service. The patient expressed understanding and agreed to proceed.   History:  Nancy Dickerson is a 38 y.o. [id number] female being evaluated today for AUB. Started bleeding in march and has bled every day since then. Passing clots, saturating pads every 1-2 hours, bleeding through clothes. Feels weak and tired. Was evaluated in ED, given megace, which helped for a week, then got worse again. She denies any abnormal vaginal discharge, bleeding, pelvic pain or other concerns.       Past Medical History:  Diagnosis Date  . Anemia   . Asthma   . Bursitis of hip   . Cardiomegaly   . Chondromalacia of hip   . Chondromalacia of right knee   . COPD (chronic obstructive pulmonary disease) ([place])   . GERD (gastroesophageal reflux disease)   . H/O transfusion of packed red blood cells   . Knee pain   . Tinnitus    Past Surgical History:  Procedure Laterality Date  . NO PAST SURGERIES     The following portions of the patient's history were reviewed and updated as appropriate: allergies, current medications, past family history, past medical history, past social history, past surgical history and problem list.   Health Maintenance:  Normal pap and negative HRHPV on 2019.  Review of Systems:  Pertinent items noted in HPI and remainder of comprehensive ROS otherwise negative.  Physical Exam:   General:  Alert, oriented and cooperative.   Mental Status: Normal mood and affect perceived. Normal judgment and  thought content.  Physical exam deferred due to nature of the encounter  Labs and Imaging No results found for this or any previous visit (from the past 336 hour(s)). No results found.    Assessment and Plan:     1. DUB (dysfunctional uterine bleeding) Will increase megace to 80mg  BID. Will get [place] to evaluate endometrium. F/u in office after that. May need Bloodwork - CBC. - US PELVIS TRANSVAGINAL NON-OB (TV ONLY); Future - megestrol (MEGACE) 40 MG tablet; Take 2 tablets (80 mg total) by mouth 2 (two) times daily.  Dispense: 120 tablet; Refill: 5       I discussed the assessment and treatment plan with the patient. The patient was provided an opportunity to ask questions and all were answered. The patient agreed with the plan and demonstrated an understanding of the instructions.   The patient was advised to call back or seek an in-person evaluation/go to the ED if the symptoms worsen or if the condition fails to improve as anticipated.  I provided 13 minutes of non-face-to-face time during this encounter.   [place] for [employer], [place]

## 2019-04-15 ENCOUNTER — Emergency Department (HOSPITAL_COMMUNITY): Payer: Medicaid Other

## 2019-04-15 ENCOUNTER — Encounter: Payer: Self-pay | Admitting: Hematology

## 2019-04-15 ENCOUNTER — Other Ambulatory Visit: Payer: Self-pay

## 2019-04-15 ENCOUNTER — Emergency Department (HOSPITAL_COMMUNITY)
Admission: EM | Admit: 2019-04-15 | Discharge: 2019-04-15 | Disposition: A | Discharge status: Home / Self Care | Payer: Medicaid Other | Attending: Emergency Medicine | Admitting: Emergency Medicine

## 2019-04-15 ENCOUNTER — Encounter (HOSPITAL_COMMUNITY): Payer: Self-pay | Admitting: Emergency Medicine

## 2019-04-15 DIAGNOSIS — N939 Abnormal uterine and vaginal bleeding, unspecified: Secondary | ICD-10-CM | POA: Diagnosis present

## 2019-04-15 DIAGNOSIS — R52 Pain, unspecified: Secondary | ICD-10-CM

## 2019-04-15 DIAGNOSIS — R531 Weakness: Secondary | ICD-10-CM | POA: Diagnosis not present

## 2019-04-15 DIAGNOSIS — F1210 Cannabis abuse, uncomplicated: Secondary | ICD-10-CM | POA: Insufficient documentation

## 2019-04-15 DIAGNOSIS — J449 Chronic obstructive pulmonary disease, unspecified: Secondary | ICD-10-CM | POA: Diagnosis not present

## 2019-04-15 DIAGNOSIS — N938 Other specified abnormal uterine and vaginal bleeding: Secondary | ICD-10-CM

## 2019-04-15 LAB — URINALYSIS, ROUTINE W REFLEX MICROSCOPIC
Bilirubin Urine: NEGATIVE
Glucose, UA: NEGATIVE mg/dL
Ketones, ur: NEGATIVE mg/dL
Nitrite: NEGATIVE
Protein, ur: 100 mg/dL — AB
Specific Gravity, Urine: 1.024 (ref 1.005–1.030)
pH: 6 (ref 5.0–8.0)

## 2019-04-15 LAB — BASIC METABOLIC PANEL
Anion gap: 8 (ref 5–15)
BUN: 6 mg/dL (ref 6–20)
CO2: 22 mmol/L (ref 22–32)
Calcium: 9.3 mg/dL (ref 8.9–10.3)
Chloride: 109 mmol/L (ref 98–111)
Creatinine, Ser: 0.75 mg/dL (ref 0.44–1.00)
GFR calc Af Amer: >60 mL/min (ref >60)
GFR calc non Af Amer: >60 mL/min (ref >60)
Glucose, Bld: 94 mg/dL (ref 70–99)
Potassium: 3.7 mmol/L (ref 3.5–5.1)
Sodium: 139 mmol/L (ref 135–145)

## 2019-04-15 LAB — CBC
HCT: 28 % — ABNORMAL LOW (ref 36.0–46.0)
Hemoglobin: 8 g/dL — ABNORMAL LOW (ref 12.0–15.0)
MCH: 20.9 pg — ABNORMAL LOW (ref 26.0–34.0)
MCHC: 28.6 g/dL — ABNORMAL LOW (ref 30.0–36.0)
MCV: 73.3 fL — ABNORMAL LOW (ref 80.0–100.0)
Platelets: 496 10*3/uL — ABNORMAL HIGH (ref 150–400)
RBC: 3.82 MIL/uL — ABNORMAL LOW (ref 3.87–5.11)
RDW: 17.9 % — ABNORMAL HIGH (ref 11.5–15.5)
WBC: 8.8 10*3/uL (ref 4.0–10.5)
nRBC: 0.2 % (ref 0.0–0.2)

## 2019-04-15 LAB — CBG MONITORING, ED: Glucose-Capillary: 79 mg/dL (ref 70–99)

## 2019-04-15 LAB — TYPE AND SCREEN
ABO/RH(D): O POS
Antibody Screen: NEGATIVE

## 2019-04-15 LAB — I-STAT BETA HCG BLOOD, ED (MC, WL, AP ONLY): I-stat hCG, quantitative: <5 m[IU]/mL (ref <5)

## 2019-04-15 MED ORDER — SODIUM CHLORIDE 0.9 % IV SOLN
510.0000 mg | Freq: Once | INTRAVENOUS | Status: AC
  Administered 2019-04-15: 510 mg via INTRAVENOUS
  Filled 2019-04-15: qty 17

## 2019-04-15 MED ORDER — SODIUM CHLORIDE 0.9% FLUSH
3.0000 mL | Freq: Once | INTRAVENOUS | Status: AC
  Administered 2019-04-15: 3 mL via INTRAVENOUS

## 2019-04-15 MED ORDER — MEGESTROL ACETATE 40 MG PO TABS: ORAL_TABLET | ORAL | 5 refills | Status: DC

## 2019-04-15 NOTE — ED Notes (Signed)
Patient transported to Ultrasound 

## 2019-04-15 NOTE — ED Provider Notes (Signed)
[facility] DEPART[facility] Provider Note   CSN: [id number] Arrival date & time: 04/15/19  1405     History   Chief Complaint Chief Complaint  Patient presents with  . Vaginal Bleeding  . Weakness    HPI Nancy Dickerson is a 38 y.o. female.     The history is provided by the patient. No language interpreter was used.  Vaginal Bleeding Quality:  Bright red Severity:  Moderate Onset quality:  Gradual Timing:  Constant Progression:  Worsening Chronicity:  New Relieved by:  Nothing Worsened by:  Nothing Ineffective treatments:  None tried Associated symptoms: no abdominal pain   Weakness Associated symptoms: no abdominal pain    Pt reports she has severe menstraul cramps.  Pt was seen by Dr. Nehemiah Settle recently and started on Megace 60 mg twice a day.  Pt was advised yesterday to come in because of continued bleeding for Ultrasound.  Past Medical History:  Diagnosis Date  . Anemia   . Asthma   . Bursitis of hip   . Cardiomegaly   . Chondromalacia of hip   . Chondromalacia of right knee   . COPD (chronic obstructive pulmonary disease) ([place])   . GERD (gastroesophageal reflux disease)   . H/O transfusion of packed red blood cells   . Knee pain   . Tinnitus     Patient Active Problem List   Diagnosis Date Noted  . Uterine leiomyoma 03/12/2018  . Trochanteric bursitis of right hip 03/12/2018  . Class 3 severe obesity due to excess calories without serious comorbidity with body mass index (BMI) of 40.0 to 44.9 in adult ([place]) 03/12/2018  . Iron deficiency anemia due to chronic blood loss 01/29/2018  . Hypokalemia 01/29/2018  . Right sided weakness 01/29/2018    Past Surgical History:  Procedure Laterality Date  . NO PAST SURGERIES       OB History    Gravida  7   Para      Term      Preterm      AB  1   Living  6     SAB  1   TAB      Ectopic      Multiple      Live Births               Home Medications    Prior  to Admission medications   Medication Sig Start Date End Date Taking? Authorizing Provider  megestrol (MEGACE) 40 MG tablet Take 3 tablets twice a day 04/15/19   Caryl Ada K, PA-C  ondansetron (ZOFRAN) 4 MG tablet Take 1 tablet (4 mg total) by mouth 2 (two) times daily as needed for nausea or vomiting. 01/10/19   Ladell Pier, MD    Family History No family history on file.  Social History Social History   Tobacco Use  . Smoking status: Never Smoker  . Smokeless tobacco: Never Used  Substance Use Topics  . Alcohol use: Yes    Comment: rare  . Drug use: Yes    Types: Marijuana     Allergies   Patient has no known allergies.   Review of Systems Review of Systems  Gastrointestinal: Negative for abdominal pain.  Genitourinary: Positive for vaginal bleeding.  Neurological: Positive for weakness.  All other systems reviewed and are negative.    Physical Exam Updated Vital Signs BP 127/80 (BP Location: Right Arm)   Pulse 88   Temp 98.6 F (37 C) (  Oral)   Resp (!) 21   Ht 5\' 4"  (1.626 m)   Wt 113.4 kg   SpO2 100%   BMI 42.91 kg/m   Physical Exam Vitals signs and nursing note reviewed.  Constitutional:      Appearance: She is well-developed.  HENT:     Head: Normocephalic.     Right Ear: Tympanic membrane normal.     Left Ear: Tympanic membrane normal.     Mouth/Throat:     Mouth: Mucous membranes are moist.  Eyes:     Pupils: Pupils are equal, round, and reactive to light.  Neck:     Musculoskeletal: Normal range of motion.  Cardiovascular:     Rate and Rhythm: Normal rate.  Pulmonary:     Effort: Pulmonary effort is normal.  Abdominal:     General: There is no distension.  Musculoskeletal: Normal range of motion.  Skin:    General: Skin is warm.  Neurological:     General: No focal deficit present.     Mental Status: She is alert and oriented to person, place, and time.  Psychiatric:        Mood and Affect: Mood normal.      ED  Treatments / Results  Labs (all labs ordered are listed, but only abnormal results are displayed) Labs Reviewed  CBC - Abnormal; Notable for the following components:      Result Value   RBC 3.82 (*)    Hemoglobin 8.0 (*)    HCT 28.0 (*)    MCV 73.3 (*)    MCH 20.9 (*)    MCHC 28.6 (*)    RDW 17.9 (*)    Platelets 496 (*)    All other components within normal limits  URINALYSIS, ROUTINE W REFLEX MICROSCOPIC - Abnormal; Notable for the following components:   Color, Urine AMBER (*)    APPearance CLOUDY (*)    Hgb urine dipstick LARGE (*)    Protein, ur 100 (*)    Leukocytes,Ua LARGE (*)    Bacteria, UA FEW (*)    Trichomonas, UA PRESENT (*)    All other components within normal limits  BASIC METABOLIC PANEL  I-STAT BETA HCG BLOOD, ED ([facility], [facility], [facility] ONLY)  CBG MONITORING, ED  TYPE AND SCREEN    EKG None  Radiology US Pelvic Complete W Transvaginal And Torsion R/o  Result Date: 04/15/2019 CLINICAL DATA:  Pelvic pain and vaginal bleeding for 4 months EXAM: TRANSABDOMINAL AND TRANSVAGINAL ULTRASOUND OF PELVIS DOPPLER ULTRASOUND OF OVARIES TECHNIQUE: Both transabdominal and transvaginal ultrasound examinations of the pelvis were performed. Transabdominal technique was performed for global imaging of the pelvis including uterus, ovaries, adnexal regions, and pelvic cul-de-sac. It was necessary to proceed with endovaginal exam following the transabdominal exam to visualize the uterus, endometrium and ovaries. Color and duplex Doppler ultrasound was utilized to evaluate blood flow to the ovaries. COMPARISON:  None. FINDINGS: Uterus Measurements: 10.6 x 8.6 x 7.6 cm = volume: 363 mL. 5.3 x 4.4 x 4.4 cm heterogeneously hypoechoic intramural fibroid within the anterior left lateral aspect of the uterine fundus. Additional 2.8 x 2.3 x 1.6 cm heterogeneously hypoechoic fundal fibroid seen posteriorly. Fibroids results in distortion of the fundal endometrium. Endometrium Thickness: 13.9 mm.  Endometrial canal contains fluid and echogenic debris. Right ovary Measurements: 3.2 x 2.4 x 2.3 cm = volume: 9.7 mL. Normal appearance/no adnexal mass. Left ovary Measurements: 3.6 x 1.8 x 2.5 cm = volume: 8.5 mL. Normal follicles. No adnexal masses. Pulsed  Doppler evaluation of both ovaries demonstrates normal low-resistance arterial and venous waveforms. Other findings Trace anechoic free fluid in the posterior cul-de-sac IMPRESSION: Leiomyomatous uterus. Fibroids appear to distort portion of the fundal endometrium. Heterogeneous debris and fluid within the endometrial canal likely reflect hemorrhagic products related to sub mucosal extension versus underlying endometrial abnormality. Consider gynecologic consultation and possible referral for hysterosonography for further endometrial assessment. Electronically Signed   By: MD Lovena Le   On: 04/15/2019 19:14    Procedures Procedures (including critical care time)  Medications Ordered in ED Medications  sodium chloride flush (NS) 0.9 % injection 3 mL (3 mLs Intravenous Given 04/15/19 1726)  ferumoxytol (FERAHEME) 510 mg in sodium chloride 0.9 % 100 mL IVPB (0 mg Intravenous Stopped 04/15/19 2201)     Initial Impression / Assessment and Plan / ED Course  I have reviewed the triage vital signs and the nursing notes.  Pertinent labs & imaging results that were available during my care of the patient were reviewed by me and considered in my medical decision making (see chart for details).        I spoke with Dr. Nehemiah Settle who advised Feraheme here.  Pt has received in the past.  Pt advised to call her hematologist for followup.  Pt is advised to increase Megace to 120mg  twice a day.  He advised recheck at [facility] clinic in 2 weeks.  Pt advised of results   Final Clinical Impressions(s) / ED Diagnoses   Final diagnoses:  Pain  Vaginal bleeding    ED Discharge Orders         Ordered    megestrol (MEGACE) 40 MG tablet     04/15/19 2236         An After Visit Summary was printed and given to the patient.    Sidney Ace 04/15/19 2241    Davonna Belling, MD 04/15/19 [phone]

## 2019-04-15 NOTE — ED Notes (Signed)
Pt given apple juice and a sandwich.

## 2019-04-15 NOTE — ED Notes (Signed)
Pt returns from US.

## 2019-04-15 NOTE — Discharge Instructions (Signed)
Increase Megace to 3 tablets twice a day. Follow up

## 2019-04-15 NOTE — ED Triage Notes (Addendum)
Pt being sent by Dr. Nehemiah Settle for vaginal bleeding with large clots, hx of low hemoglobin. The pt reports she has been hemorrhaging since march and is severely anemic. She reports left side of face tingling. She goes through a pad every 1 hr.

## 2019-04-16 ENCOUNTER — Other Ambulatory Visit: Payer: Self-pay | Admitting: Obstetrics & Gynecology

## 2019-04-16 ENCOUNTER — Encounter: Payer: Self-pay | Admitting: *Deleted

## 2019-04-16 ENCOUNTER — Encounter: Payer: Self-pay | Admitting: General Practice

## 2019-04-16 ENCOUNTER — Telehealth: Payer: Self-pay | Admitting: *Deleted

## 2019-04-16 ENCOUNTER — Encounter: Payer: Self-pay | Admitting: Internal Medicine

## 2019-04-16 DIAGNOSIS — R9431 Abnormal electrocardiogram [ECG] [EKG]: Secondary | ICD-10-CM

## 2019-04-16 NOTE — Progress Notes (Signed)
fereheme (second dose) ordered

## 2019-04-16 NOTE — Telephone Encounter (Signed)
Spoke with patient via phone around 1157 am today.  Appointment for follow up with Dr. Hulan Fray on 05/01/19 at 1335 given to patient.  Explained patient needed to see Dr. Hulan Fray instead of Dr. Nehemiah Settle because he doesn't do GYN surgery.  I explained she may not need surgery but I thought it might be one of the options presented to her.  I explained since she had an ultrasound in the emergency room yesterday I didn't think she needed another but I would have Dr. Hulan Fray look at it.  I explained if she did need another ultrasound I would call her back with an appointment.  Patient states she did have iron infusion yesterday and states she is still feeling very tired today.  Requests a note for work.  States the emergency room only gave her a note stating she was there yesterday.  I explained I would get her a note for work.  I explained to patient she might need an endometrial biopsy when she comes to see Dr. Hulan Fray.  I briefly explained what that would entail.  Patient states understanding and had no questions.

## 2019-04-16 NOTE — Telephone Encounter (Signed)
Patient is requesting additional PCP explanation for the t wave abnormality she can see in her mychart.

## 2019-04-16 NOTE — Progress Notes (Signed)
Abnormal EKG at ED I have placed an order for Cardiology referral.

## 2019-04-21 ENCOUNTER — Other Ambulatory Visit: Payer: Self-pay | Admitting: *Deleted

## 2019-04-21 ENCOUNTER — Telehealth: Payer: Self-pay | Admitting: Hematology

## 2019-04-21 DIAGNOSIS — D500 Iron deficiency anemia secondary to blood loss (chronic): Secondary | ICD-10-CM

## 2019-04-21 NOTE — Telephone Encounter (Signed)
Confirmed with patient 7/17 lab/fu.

## 2019-04-24 ENCOUNTER — Other Ambulatory Visit: Payer: Self-pay

## 2019-04-24 ENCOUNTER — Ambulatory Visit (HOSPITAL_COMMUNITY)
Admission: RE | Admit: 2019-04-24 | Discharge: 2019-04-24 | Disposition: A | Discharge status: Home / Self Care | Payer: Medicaid Other | Source: Ambulatory Visit | Attending: Obstetrics & Gynecology | Admitting: Obstetrics & Gynecology

## 2019-04-24 DIAGNOSIS — D649 Anemia, unspecified: Secondary | ICD-10-CM | POA: Insufficient documentation

## 2019-04-24 MED ORDER — SODIUM CHLORIDE 0.9 % IV SOLN
510.0000 mg | Freq: Once | INTRAVENOUS | Status: AC
  Administered 2019-04-24: 510 mg via INTRAVENOUS
  Filled 2019-04-24: qty 17

## 2019-04-25 ENCOUNTER — Inpatient Hospital Stay: Payer: Medicaid Other | Attending: Hematology | Admitting: Hematology

## 2019-04-25 ENCOUNTER — Inpatient Hospital Stay: Payer: Medicaid Other

## 2019-04-28 ENCOUNTER — Telehealth: Payer: Self-pay | Admitting: Hematology

## 2019-04-28 NOTE — Telephone Encounter (Signed)
Called pt to reschedule appt from 7/17 - pt states she is pending with insurance and will call us back to reschedule once she has her insurance all set up.

## 2019-04-29 ENCOUNTER — Telehealth: Payer: Self-pay

## 2019-04-29 NOTE — Telephone Encounter (Signed)
7-22 @ 420PM [facility]    COVID-19 Pre-Screening Questions:  . In the past 7 to 10 days have you had a cough,  shortness of breath, headache, congestion, fever (100 or greater) body aches, chills, sore throat, or sudden loss of taste or sense of smell? NO . Have you been around anyone with known Covid 19. NO . Have you been around anyone who is awaiting Covid 19 test results in the past 7 to 10 days? NO . Have you been around anyone who has been exposed to Covid 19, or has mentioned symptoms of Covid 19 within the past 7 to 10 days? NO  PT WILL ARRIVE EARLY W/MASK AND NO VISITORS

## 2019-04-29 NOTE — Progress Notes (Deleted)
Cardiology Office Note   Date:  04/29/2019   ID:  Nancy, Dickerson [birth year], MRN [id number]  PCP:  Ladell Pier, MD  Cardiologist:   No primary care provider on file. Referring:  ***  No chief complaint on file.     History of Present Illness: Nancy Dickerson is a 38 y.o. female who was referred by *** for evaluation of an abnormal EKG.  ***   Past Medical History:  Diagnosis Date  . Anemia   . Asthma   . Bursitis of hip   . Cardiomegaly   . Chondromalacia of hip   . Chondromalacia of right knee   . COPD (chronic obstructive pulmonary disease) ([place])   . GERD (gastroesophageal reflux disease)   . H/O transfusion of packed red blood cells   . Knee pain   . Tinnitus     Past Surgical History:  Procedure Laterality Date  . NO PAST SURGERIES       Current Outpatient Medications  Medication Sig Dispense Refill  . megestrol (MEGACE) 40 MG tablet Take 3 tablets twice a day 120 tablet 5  . ondansetron (ZOFRAN) 4 MG tablet Take 1 tablet (4 mg total) by mouth 2 (two) times daily as needed for nausea or vomiting. 20 tablet 0   No current facility-administered medications for this visit.     Allergies:   Patient has no known allergies.    Social History:  The patient  reports that she has never smoked. She has never used smokeless tobacco. She reports current alcohol use. She reports current drug use. Drug: Marijuana.   Family History:  The patient's ***family history is not on file.    ROS:  Please see the history of present illness.   Otherwise, review of systems are positive for {NONE DEFAULTED:18576::"none"}.   All other systems are reviewed and negative.    PHYSICAL EXAM: VS:  There were no vitals taken for this visit. , BMI There is no height or weight on file to calculate BMI. GENERAL:  Well appearing HEENT:  Pupils equal round and reactive, fundi not visualized, oral mucosa unremarkable NECK:  No jugular venous distention, waveform within normal  limits, carotid upstroke brisk and symmetric, no bruits, no thyromegaly LYMPHATICS:  No cervical, inguinal adenopathy LUNGS:  Clear to auscultation bilaterally BACK:  No CVA tenderness CHEST:  Unremarkable HEART:  PMI not displaced or sustained,S1 and S2 within normal limits, no S3, no S4, no clicks, no rubs, *** murmurs ABD:  Flat, positive bowel sounds normal in frequency in pitch, no bruits, no rebound, no guarding, no midline pulsatile mass, no hepatomegaly, no splenomegaly EXT:  2 plus pulses throughout, no edema, no cyanosis no clubbing SKIN:  No rashes no nodules NEURO:  Cranial nerves II through XII grossly intact, motor grossly intact throughout PSYCH:  Cognitively intact, oriented to person place and time    EKG:  EKG {ACTION; IS/IS QMV:[id number]} ordered today. The ekg ordered today demonstrates ***   Recent Labs: 07/08/2018: TSH 0.768 04/15/2019: BUN 6; Creatinine, Ser 0.75; Hemoglobin 8.0; Platelets 496; Potassium 3.7; Sodium 139    Lipid Panel    Component Value Date/Time   CHOL 115 01/30/2018 0355   TRIG 89 01/30/2018 0355   HDL 39 (L) 01/30/2018 0355   CHOLHDL 2.9 01/30/2018 0355   VLDL 18 01/30/2018 0355   LDLCALC 58 01/30/2018 0355      Wt Readings from Last 3 Encounters:  04/24/19 250 lb (113.4 kg)  04/15/19  250 lb (113.4 kg)  01/10/19 253 lb 12.8 oz (115.1 kg)      Other studies Reviewed: Additional studies/ records that were reviewed today include: ***. Review of the above records demonstrates:  Please see elsewhere in the note.  ***   ASSESSMENT AND PLAN:  ***   Current medicines are reviewed at length with the patient today.  The patient {ACTIONS; HAS/DOES NOT HAVE:19233} concerns regarding medicines.  The following changes have been made:  {PLAN; NO CHANGE:13088:s}  Labs/ tests ordered today include: *** No orders of the defined types were placed in this encounter.    Disposition:   FU with ***    Signed, Minus Breeding, MD   04/29/2019 6:36 PM    [facility]

## 2019-04-30 ENCOUNTER — Ambulatory Visit: Payer: Self-pay | Admitting: Cardiology

## 2019-04-30 ENCOUNTER — Ambulatory Visit (HOSPITAL_COMMUNITY): Payer: Medicaid Other

## 2019-05-01 ENCOUNTER — Other Ambulatory Visit: Payer: Self-pay

## 2019-05-01 ENCOUNTER — Other Ambulatory Visit (HOSPITAL_COMMUNITY)
Admission: RE | Admit: 2019-05-01 | Discharge: 2019-05-01 | Disposition: A | Discharge status: Home / Self Care | Payer: Medicaid Other | Source: Ambulatory Visit | Attending: Obstetrics & Gynecology | Admitting: Obstetrics & Gynecology

## 2019-05-01 ENCOUNTER — Ambulatory Visit (INDEPENDENT_AMBULATORY_CARE_PROVIDER_SITE_OTHER): Payer: Self-pay | Admitting: Obstetrics & Gynecology

## 2019-05-01 ENCOUNTER — Encounter: Payer: Self-pay | Admitting: Obstetrics & Gynecology

## 2019-05-01 VITALS — BP 126/83 | HR 95 | Wt 244.3 lb

## 2019-05-01 DIAGNOSIS — E66813 Obesity, class 3: Secondary | ICD-10-CM

## 2019-05-01 DIAGNOSIS — Z6841 Body Mass Index (BMI) 40.0 and over, adult: Secondary | ICD-10-CM

## 2019-05-01 DIAGNOSIS — R823 Hemoglobinuria: Secondary | ICD-10-CM

## 2019-05-01 DIAGNOSIS — N921 Excessive and frequent menstruation with irregular cycle: Secondary | ICD-10-CM | POA: Diagnosis not present

## 2019-05-01 DIAGNOSIS — N840 Polyp of corpus uteri: Secondary | ICD-10-CM

## 2019-05-01 DIAGNOSIS — Z8619 Personal history of other infectious and parasitic diseases: Secondary | ICD-10-CM

## 2019-05-01 DIAGNOSIS — D500 Iron deficiency anemia secondary to blood loss (chronic): Secondary | ICD-10-CM

## 2019-05-01 DIAGNOSIS — D259 Leiomyoma of uterus, unspecified: Secondary | ICD-10-CM

## 2019-05-01 MED ORDER — METRONIDAZOLE 500 MG PO TABS: 500.0000 mg | ORAL_TABLET | Freq: Two times a day (BID) | ORAL | 0 refills | Status: DC

## 2019-05-01 NOTE — Progress Notes (Signed)
Patient ID: MONASIA LAIR, female   DOB: [birth year], 38 y.o.   MRN: [id number]  No chief complaint on file.   HPI Jaydi SHONITA RINCK is a 38 y.o. divorced P6 (38, 7, 38, 38, 37, and 37 yo) here today for follow up of her DUB with anemia. She had a blood transfusion last year and iron infusion x 2 this last year.    Past Medical History:  Diagnosis Date  . Anemia   . Asthma   . Bursitis of hip   . Cardiomegaly   . Chondromalacia of hip   . Chondromalacia of right knee   . COPD (chronic obstructive pulmonary disease) ([place])   . GERD (gastroesophageal reflux disease)   . H/O transfusion of packed red blood cells   . Knee pain   . Tinnitus     Past Surgical History:  Procedure Laterality Date  . NO PAST SURGERIES      No family history on file.  Social History Social History   Tobacco Use  . Smoking status: Never Smoker  . Smokeless tobacco: Never Used  Substance Use Topics  . Alcohol use: Yes    Comment: rare  . Drug use: Yes    Types: Marijuana    No Known Allergies  Current Outpatient Medications  Medication Sig Dispense Refill  . megestrol (MEGACE) 40 MG tablet Take 3 tablets twice a day 120 tablet 5  . ondansetron (ZOFRAN) 4 MG tablet Take 1 tablet (4 mg total) by mouth 2 (two) times daily as needed for nausea or vomiting. 20 tablet 0   No current facility-administered medications for this visit.     Review of Systems Review of Systems She had a colpo in 2010.  There were no vitals taken for this visit.  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

## 2019-05-02 ENCOUNTER — Other Ambulatory Visit: Payer: Medicaid Other

## 2019-05-02 DIAGNOSIS — D509 Iron deficiency anemia, unspecified: Secondary | ICD-10-CM

## 2019-05-02 LAB — CBC
Hematocrit: 32 % — ABNORMAL LOW (ref 34.0–46.6)
Hemoglobin: 9.5 g/dL — ABNORMAL LOW (ref 11.1–15.9)
MCH: 23.3 pg — ABNORMAL LOW (ref 26.6–33.0)
MCHC: 29.7 g/dL — ABNORMAL LOW (ref 31.5–35.7)
MCV: 79 fL (ref 79–97)
Platelets: 352 10*3/uL (ref 150–450)
RBC: 4.07 x10E6/uL (ref 3.77–5.28)
RDW: 23.3 % — ABNORMAL HIGH (ref 11.7–15.4)
WBC: 7.2 10*3/uL (ref 3.4–10.8)

## 2019-05-02 NOTE — Addendum Note (Signed)
Addended by: Bethanne Ginger on: 05/02/2019 10:39 AM   Modules accepted: Orders

## 2019-05-02 NOTE — Addendum Note (Signed)
Addended by: Bethanne Ginger on: 05/02/2019 11:08 AM   Modules accepted: Orders

## 2019-05-06 ENCOUNTER — Telehealth: Payer: Self-pay

## 2019-05-06 NOTE — Telephone Encounter (Signed)
Received call from [place] in Pathology stating that due to [place] -19 , The CDC has restrictions on doing Blood Transfusions. The patient has to have a Hemoglobin under 9. Dr. Melina Copa states is you have any further questions she can be contacted at [phone].

## 2019-05-06 NOTE — Progress Notes (Unsigned)
Called Short Stay about making appt for pt to get a Blood Transfusion. They asked about her Hemoglobin & I told her it was 9.5 & she stated  that I needed to call the Blood bank. So called Blood bank, gave them the Hemoglobin # & they stated they needed to t/w someone & call me back.

## 2019-05-13 ENCOUNTER — Encounter (HOSPITAL_COMMUNITY): Payer: Self-pay

## 2019-05-13 ENCOUNTER — Emergency Department (HOSPITAL_COMMUNITY): Payer: Medicaid Other

## 2019-05-13 ENCOUNTER — Other Ambulatory Visit: Payer: Self-pay

## 2019-05-13 ENCOUNTER — Emergency Department (HOSPITAL_COMMUNITY)
Admission: EM | Admit: 2019-05-13 | Discharge: 2019-05-13 | Disposition: A | Discharge status: Home / Self Care | Payer: Medicaid Other | Attending: Emergency Medicine | Admitting: Emergency Medicine

## 2019-05-13 DIAGNOSIS — R1030 Lower abdominal pain, unspecified: Secondary | ICD-10-CM | POA: Insufficient documentation

## 2019-05-13 DIAGNOSIS — N3001 Acute cystitis with hematuria: Secondary | ICD-10-CM | POA: Diagnosis not present

## 2019-05-13 DIAGNOSIS — Z79899 Other long term (current) drug therapy: Secondary | ICD-10-CM | POA: Insufficient documentation

## 2019-05-13 DIAGNOSIS — J449 Chronic obstructive pulmonary disease, unspecified: Secondary | ICD-10-CM | POA: Diagnosis not present

## 2019-05-13 DIAGNOSIS — D259 Leiomyoma of uterus, unspecified: Secondary | ICD-10-CM | POA: Insufficient documentation

## 2019-05-13 DIAGNOSIS — R1031 Right lower quadrant pain: Secondary | ICD-10-CM | POA: Diagnosis present

## 2019-05-13 LAB — CBC WITH DIFFERENTIAL/PLATELET
Abs Immature Granulocytes: 0.01 10*3/uL (ref 0.00–0.07)
Basophils Absolute: 0 10*3/uL (ref 0.0–0.1)
Basophils Relative: 0 %
Eosinophils Absolute: 0.1 10*3/uL (ref 0.0–0.5)
Eosinophils Relative: 2 %
HCT: 34.8 % — ABNORMAL LOW (ref 36.0–46.0)
Hemoglobin: 10.4 g/dL — ABNORMAL LOW (ref 12.0–15.0)
Immature Granulocytes: 0 %
Lymphocytes Relative: 32 %
Lymphs Abs: 2 10*3/uL (ref 0.7–4.0)
MCH: 24.3 pg — ABNORMAL LOW (ref 26.0–34.0)
MCHC: 29.9 g/dL — ABNORMAL LOW (ref 30.0–36.0)
MCV: 81.3 fL (ref 80.0–100.0)
Monocytes Absolute: 0.3 10*3/uL (ref 0.1–1.0)
Monocytes Relative: 6 %
Neutro Abs: 3.7 10*3/uL (ref 1.7–7.7)
Neutrophils Relative %: 60 %
Platelets: 393 10*3/uL (ref 150–400)
RBC: 4.28 MIL/uL (ref 3.87–5.11)
WBC: 6.1 10*3/uL (ref 4.0–10.5)
nRBC: 0 % (ref 0.0–0.2)

## 2019-05-13 LAB — COMPREHENSIVE METABOLIC PANEL
ALT: 21 U/L (ref 0–44)
AST: 19 U/L (ref 15–41)
Albumin: 4 g/dL (ref 3.5–5.0)
Alkaline Phosphatase: 46 U/L (ref 38–126)
Anion gap: 7 (ref 5–15)
BUN: 7 mg/dL (ref 6–20)
CO2: 21 mmol/L — ABNORMAL LOW (ref 22–32)
Calcium: 8.7 mg/dL — ABNORMAL LOW (ref 8.9–10.3)
Chloride: 107 mmol/L (ref 98–111)
Creatinine, Ser: 0.72 mg/dL (ref 0.44–1.00)
GFR calc Af Amer: >60 mL/min (ref >60)
GFR calc non Af Amer: >60 mL/min (ref >60)
Glucose, Bld: 121 mg/dL — ABNORMAL HIGH (ref 70–99)
Potassium: 3.2 mmol/L — ABNORMAL LOW (ref 3.5–5.1)
Sodium: 135 mmol/L (ref 135–145)
Total Bilirubin: 0.6 mg/dL (ref 0.3–1.2)
Total Protein: 7.6 g/dL (ref 6.5–8.1)

## 2019-05-13 LAB — URINALYSIS, ROUTINE W REFLEX MICROSCOPIC
Bacteria, UA: NONE SEEN
Bilirubin Urine: NEGATIVE
Glucose, UA: NEGATIVE mg/dL
Ketones, ur: NEGATIVE mg/dL
Nitrite: NEGATIVE
Protein, ur: NEGATIVE mg/dL
RBC / HPF: >50 RBC/hpf — ABNORMAL HIGH (ref 0–5)
Specific Gravity, Urine: 1.018 (ref 1.005–1.030)
WBC, UA: >50 WBC/hpf — ABNORMAL HIGH (ref 0–5)
pH: 6 (ref 5.0–8.0)

## 2019-05-13 LAB — PREGNANCY, URINE: Preg Test, Ur: NEGATIVE

## 2019-05-13 MED ORDER — IOHEXOL 300 MG/ML  SOLN
100.0000 mL | Freq: Once | INTRAMUSCULAR | Status: AC | PRN
  Administered 2019-05-13: 100 mL via INTRAVENOUS

## 2019-05-13 MED ORDER — KETOROLAC TROMETHAMINE 30 MG/ML IJ SOLN
30.0000 mg | Freq: Once | INTRAMUSCULAR | Status: AC
  Administered 2019-05-13: 30 mg via INTRAVENOUS
  Filled 2019-05-13: qty 1

## 2019-05-13 MED ORDER — SODIUM CHLORIDE 0.9 % IV SOLN
2.0000 g | Freq: Once | INTRAVENOUS | Status: AC
  Administered 2019-05-13: 2 g via INTRAVENOUS
  Filled 2019-05-13: qty 20

## 2019-05-13 MED ORDER — CEPHALEXIN 500 MG PO CAPS: 500.0000 mg | ORAL_CAPSULE | Freq: Four times a day (QID) | ORAL | 0 refills | Status: AC

## 2019-05-13 MED ORDER — ONDANSETRON HCL 4 MG/2ML IJ SOLN
4.0000 mg | Freq: Once | INTRAMUSCULAR | Status: AC
  Administered 2019-05-13: 4 mg via INTRAVENOUS
  Filled 2019-05-13: qty 2

## 2019-05-13 MED ORDER — NAPROXEN 500 MG PO TABS: 500.0000 mg | ORAL_TABLET | Freq: Two times a day (BID) | ORAL | 0 refills | Status: DC

## 2019-05-13 MED ORDER — FENTANYL CITRATE (PF) 100 MCG/2ML IJ SOLN
50.0000 ug | Freq: Once | INTRAMUSCULAR | Status: AC
  Administered 2019-05-13: 50 ug via INTRAVENOUS
  Filled 2019-05-13: qty 2

## 2019-05-13 NOTE — ED Provider Notes (Signed)
[facility] EMERGENCY DEPARTMENT Provider Note   CSN: [id number] Arrival date & time: 05/13/19  1154    History   Chief Complaint Chief Complaint  Patient presents with   Groin Pain    HPI Nancy Dickerson is a 38 y.o. female with PMH/o COPD, GERD, anemia, asthma who presents for evaluation of right groin pain x3 days.  Patient reports that she has had constant pain in the right groin that waxes and wanes in severity over the last 3 days.  She states that she first noticed it as she was walking out of her cousin's house and states that she started having severe pain to the right groin.  She reports that over the last 2 days, she felt like it started radiating towards the back.  She states she has tried Tylenol and muscle relaxers with no improvement in symptoms.  She states pain is worse when she moves and describes it as a "stabbing" type pain.  She states pain is also worse when she has a bowel movement.  Her last bowel movement was earlier this morning.  She has had some associated nausea but denies any vomiting.  She has not noted any fevers but states that she felt like the area had gotten hot.  She denies any dysuria, hematuria, vaginal bleeding, vaginal discharge, chest pain, difficulty breathing.     The history is provided by the patient.    Past Medical History:  Diagnosis Date   Anemia    Asthma    Bursitis of hip    Cardiomegaly    Chondromalacia of hip    Chondromalacia of right knee    COPD (chronic obstructive pulmonary disease) (HCC)    GERD (gastroesophageal reflux disease)    H/O transfusion of packed red blood cells    Knee pain    Tinnitus     Patient Active Problem List   Diagnosis Date Noted   Uterine leiomyoma 03/12/2018   Trochanteric bursitis of right hip 03/12/2018   Class 3 severe obesity due to excess calories without serious comorbidity with body mass index (BMI) of 40.0 to 44.9 in adult ([place]) 03/12/2018   Iron deficiency anemia due  to chronic blood loss 01/29/2018   Hypokalemia 01/29/2018   Right sided weakness 01/29/2018    Past Surgical History:  Procedure Laterality Date   NO PAST SURGERIES       OB History    Gravida  7   Para      Term      Preterm      AB  1   Living  6     SAB  1   TAB      Ectopic      Multiple      Live Births               Home Medications    Prior to Admission medications   Medication Sig Start Date End Date Taking? Authorizing Provider  megestrol (MEGACE) 40 MG tablet Take 3 tablets twice a day 04/15/19  Yes Fransico Meadow, PA-C  cephALEXin (KEFLEX) 500 MG capsule Take 1 capsule (500 mg total) by mouth 4 (four) times daily for 7 days. 05/13/19 05/20/19  Volanda Napoleon, PA-C  metroNIDAZOLE (FLAGYL) 500 MG tablet Take 1 tablet (500 mg total) by mouth 2 (two) times daily. 05/01/19   Emily Filbert, MD  naproxen (NAPROSYN) 500 MG tablet Take 1 tablet (500 mg total) by mouth 2 (two) times daily.  05/13/19   Volanda Napoleon, PA-C  ondansetron (ZOFRAN) 4 MG tablet Take 1 tablet (4 mg total) by mouth 2 (two) times daily as needed for nausea or vomiting. Patient not taking: Reported on 05/13/2019 01/10/19   Ladell Pier, MD    Family History No family history on file.  Social History Social History   Tobacco Use   Smoking status: Never Smoker   Smokeless tobacco: Never Used  Substance Use Topics   Alcohol use: Yes    Comment: rare   Drug use: Yes    Types: Marijuana     Allergies   Patient has no known allergies.   Review of Systems Review of Systems  Constitutional: Negative for fever.  Respiratory: Negative for cough and shortness of breath.   Cardiovascular: Negative for chest pain.  Gastrointestinal: Positive for nausea. Negative for abdominal pain and vomiting.       Right groin pain  Genitourinary: Negative for dysuria and hematuria.  Neurological: Negative for headaches.  All other systems reviewed and are negative.    Physical  Exam Updated Vital Signs BP 116/80    Pulse 75    Temp 98.3 F (36.8 C) (Oral)    Resp 18    Ht 5\' 4"  (1.626 m)    Wt 110.7 kg    SpO2 100%    BMI 41.88 kg/m   Physical Exam Vitals signs and nursing note reviewed. Exam conducted with a chaperone present.  Constitutional:      Appearance: Normal appearance. She is well-developed.  HENT:     Head: Normocephalic and atraumatic.  Eyes:     General: Lids are normal.     Conjunctiva/sclera: Conjunctivae normal.     Pupils: Pupils are equal, round, and reactive to light.  Neck:     Musculoskeletal: Full passive range of motion without pain.  Cardiovascular:     Rate and Rhythm: Normal rate and regular rhythm.     Pulses: Normal pulses.          Radial pulses are 2+ on the right side and 2+ on the left side.       Dorsalis pedis pulses are 2+ on the right side and 2+ on the left side.     Heart sounds: Normal heart sounds. No murmur. No friction rub. No gallop.   Pulmonary:     Effort: Pulmonary effort is normal.     Breath sounds: Normal breath sounds.     Comments: Lungs clear to auscultation bilaterally.  Symmetric chest rise.  No wheezing, rales, rhonchi. Abdominal:     Palpations: Abdomen is soft. Abdomen is not rigid.     Tenderness: There is no abdominal tenderness. There is right CVA tenderness. There is no guarding.     Hernia: A hernia is present. There is no hernia in the left inguinal area, right femoral area, left femoral area or right inguinal area.       Comments: Abdomen is soft, non-distended.  Tenderness noted to the suprapubic region that extends just right over the mons pubis.  No overlying warmth, erythema, edema.  No evidence of fluctuant or mass.  No rigidity, No guarding. No peritoneal signs.  Right-sided CVA tenderness.  Genitourinary:    Comments: The exam was performed with a chaperone present. Normal external female genitalia. No lesions, rash, or sores. Tenderness overlying the mons pubis on the right side.   No evidence of palpable mass or fluctuance.  No inguinal lymphadenopathy noted bilaterally.  No overlying  warmth, erythema, edema.  No rash, lesions. Musculoskeletal: Normal range of motion.  Skin:    General: Skin is warm and dry.     Capillary Refill: Capillary refill takes less than 2 seconds.  Neurological:     Mental Status: She is alert and oriented to person, place, and time.  Psychiatric:        Speech: Speech normal.      ED Treatments / Results  Labs (all labs ordered are listed, but only abnormal results are displayed) Labs Reviewed  COMPREHENSIVE METABOLIC PANEL - Abnormal; Notable for the following components:      Result Value   Potassium 3.2 (*)    CO2 21 (*)    Glucose, Bld 121 (*)    Calcium 8.7 (*)    All other components within normal limits  CBC WITH DIFFERENTIAL/PLATELET - Abnormal; Notable for the following components:   Hemoglobin 10.4 (*)    HCT 34.8 (*)    MCH 24.3 (*)    MCHC 29.9 (*)    All other components within normal limits  URINALYSIS, ROUTINE W REFLEX MICROSCOPIC - Abnormal; Notable for the following components:   APPearance HAZY (*)    Hgb urine dipstick LARGE (*)    Leukocytes,Ua LARGE (*)    RBC / HPF >50 (*)    WBC, UA >50 (*)    All other components within normal limits  URINE CULTURE  PREGNANCY, URINE  I-STAT BETA HCG BLOOD, ED ([facility], [facility], [facility] ONLY)    EKG None  Radiology Ct Abdomen Pelvis W Contrast  Result Date: 05/13/2019 CLINICAL DATA:  Right lower quadrant abdominal pain and right leg pain. Symptoms for 3 days. EXAM: CT ABDOMEN AND PELVIS WITH CONTRAST TECHNIQUE: Multidetector CT imaging of the abdomen and pelvis was performed using the standard protocol following bolus administration of intravenous contrast. CONTRAST:  187mL OMNIPAQUE IOHEXOL 300 MG/ML  SOLN COMPARISON:  None. FINDINGS: Lower chest: The lung bases are clear of acute process. No pleural effusion or pulmonary lesions. The heart is normal in size. No pericardial  effusion. The distal esophagus and aorta are unremarkable. Hepatobiliary: No focal hepatic lesions or intrahepatic biliary dilatation. The gallbladder is mildly contracted. Normal common bile duct. Pancreas: No mass, inflammation or ductal dilatation. Spleen: Normal size.  No focal lesions. Adrenals/Urinary Tract: The adrenal glands are unremarkable. Small mid and upper pole right renal calculi but no obstructing ureteral calculi or bladder calculi. No worrisome renal lesions. Small right renal cyst is noted. The bladder is unremarkable. Stomach/Bowel: The stomach, duodenum, small bowel and colon are grossly normal without oral contrast. No inflammatory changes, mass lesions or obstructive findings. The terminal ileum and appendix are normal. Vascular/Lymphatic: The aorta is normal in caliber. No dissection. The branch vessels are patent. The major venous structures are patent. No mesenteric or retroperitoneal mass or adenopathy. Small scattered lymph nodes are noted. Reproductive: Enlarged fibroid uterus. Dominant fibroid measures 7.6 x 6.0 cm and involves the anterior myometrium with mass effect on the endometrium. The ovaries appear normal. Essure closure devices are noted. Other: No pelvic mass or pelvic lymphadenopathy. No free pelvic fluid collections. No inguinal mass or adenopathy. Periumbilical abdominal wall hernia containing fat. Musculoskeletal: No significant bony findings. The lumbar vertebral bodies are normally aligned. Disc spaces are maintained. No large disc protrusions are identified. IMPRESSION: 1. No acute abdominal/pelvic findings, mass lesions or adenopathy. 2. Right renal calculi but no obstructing ureteral calculi or bladder calculi. 3. Enlarged fibroid uterus. 4. Periumbilical abdominal wall hernia  containing fat. Electronically Signed   By: Marijo Sanes M.D.   On: 05/13/2019 17:12    Procedures Procedures (including critical care time)  Medications Ordered in ED Medications    fentaNYL (SUBLIMAZE) injection 50 mcg (50 mcg Intravenous Given 05/13/19 1410)  ondansetron (ZOFRAN) injection 4 mg (4 mg Intravenous Given 05/13/19 1410)  ketorolac (TORADOL) 30 MG/ML injection 30 mg (30 mg Intravenous Given 05/13/19 1524)  iohexol (OMNIPAQUE) 300 MG/ML solution 100 mL (100 mLs Intravenous Contrast Given 05/13/19 1648)  cefTRIAXone (ROCEPHIN) 2 g in sodium chloride 0.9 % 100 mL IVPB (0 g Intravenous Stopped 05/13/19 1853)     Initial Impression / Assessment and Plan / ED Course  I have reviewed the triage vital signs and the nursing notes.  Pertinent labs & imaging results that were available during my care of the patient were reviewed by me and considered in my medical decision making (see chart for details).        38 year old female who presents for evaluation of right groin pain x3 days.  No preceding trauma, injury.  Does report some nausea but no fevers, vomiting.  Patient is afebrile, non-toxic appearing, sitting comfortably on examination table. Vital signs reviewed and stable.  On exam, tenderness palpation noted to lower right abdomen/superior aspect of mons pubis.  I do not feel any palpable mass that would be concerning for hernia.  Additionally, I do not see any overlying warmth, erythema that would be concerning for infectious process such as an abscess.  Question if she is having muscle strain versus kidney stone versus Pyelo.  Plan to check labs.  Urine pregnancy negative.  UA does show large hemoglobin, large leukocytes, pyuria.  Urine culture sent.  CMP shows potassium of 3.2, bicarb 21.  Otherwise unremarkable.  He without any significant leukocytosis.  Hemoglobin stable at 10.4.  Urine culture sent.  Given large leukocytes, pyuria, will plan to treat.  Question if this is Pilo given that she does have some CVA tenderness.  CT on pelvis shows no acute abdominal pelvic findings.  She does have noted right renal calculi as well as renal cyst.  Additionally, she has a  periumbilical abdominal wall hernia containing fat.  This does not correlate where she is tender.  Additionally, she has enlarged you fibroid uterus.  Question if this is causing her pain.  Reevaluation.  Patient resting comfortably in bed.  We will plan to give dose of Rocephin here in the ED. additionally, will plan to treat as Pilo given urine.  Urine culture sent.  Discussed with patient that this could be her uterine fibroids.  She has had significant pain with that before.  Encourage follow-up with her OB/GYN. At this time, patient exhibits no emergent life-threatening condition that require further evaluation in ED or admission. Patient had ample opportunity for questions and discussion. All patient's questions were answered with full understanding. Strict return precautions discussed. Patient expresses understanding and agreement to plan.   Portions of this note were generated with Lobbyist. Dictation errors may occur despite best attempts at proofreading.   Final Clinical Impressions(s) / ED Diagnoses   Final diagnoses:  Lower abdominal pain  Acute cystitis with hematuria  Uterine leiomyoma, unspecified location    ED Discharge Orders         Ordered    cephALEXin (KEFLEX) 500 MG capsule  4 times daily     05/13/19 1826    naproxen (NAPROSYN) 500 MG tablet  2 times daily  05/13/19 1826           Volanda Napoleon, PA-C 05/13/19 2117    Francine Graven, DO 05/17/19 [phone]

## 2019-05-13 NOTE — Discharge Instructions (Signed)
As we discussed, your work-up today was reassuring.  Your lab work was reassuring.  Your CT scan did not show an obvious cause of what is causing her symptoms.  As we discussed, this could be a uterine fibroids versus muscle strain.    Take antibiotics as directed. Please take all of your antibiotics until finished.  Follow-up with your OB/GYN.  Return the emergency department for any worsening pain, fevers or any other.

## 2019-05-13 NOTE — ED Triage Notes (Signed)
Pt presents to ED with complaints of right groin pain x 3 days. Pt denies knot or fever.

## 2019-05-15 ENCOUNTER — Encounter: Payer: Self-pay | Admitting: Internal Medicine

## 2019-05-15 LAB — URINE CULTURE
Culture: NO GROWTH
Special Requests: NORMAL

## 2019-05-19 ENCOUNTER — Encounter: Payer: Self-pay | Admitting: *Deleted

## 2019-05-23 ENCOUNTER — Encounter: Payer: Self-pay | Admitting: Obstetrics & Gynecology

## 2019-05-23 ENCOUNTER — Other Ambulatory Visit: Payer: Self-pay

## 2019-05-23 ENCOUNTER — Other Ambulatory Visit (HOSPITAL_COMMUNITY)
Admission: RE | Admit: 2019-05-23 | Discharge: 2019-05-23 | Disposition: A | Discharge status: Home / Self Care | Payer: Medicaid Other | Source: Ambulatory Visit | Attending: Obstetrics & Gynecology | Admitting: Obstetrics & Gynecology

## 2019-05-23 ENCOUNTER — Ambulatory Visit (INDEPENDENT_AMBULATORY_CARE_PROVIDER_SITE_OTHER): Payer: Self-pay | Admitting: Obstetrics & Gynecology

## 2019-05-23 VITALS — BP 145/79 | HR 91 | Wt 243.5 lb

## 2019-05-23 DIAGNOSIS — D069 Carcinoma in situ of cervix, unspecified: Secondary | ICD-10-CM

## 2019-05-23 DIAGNOSIS — R87612 Low grade squamous intraepithelial lesion on cytologic smear of cervix (LGSIL): Secondary | ICD-10-CM

## 2019-05-23 DIAGNOSIS — N871 Moderate cervical dysplasia: Secondary | ICD-10-CM | POA: Diagnosis not present

## 2019-05-23 MED ORDER — METRONIDAZOLE 500 MG PO TABS: ORAL_TABLET | ORAL | 0 refills | Status: DC

## 2019-05-23 NOTE — Progress Notes (Signed)
   Subjective:    Patient ID: MARQUITE ATTWOOD, female    DOB: 12/29/1980, 38 y.o.   MRN: [id number]  HPI 38 yo divorced P6 here for a colpo. She had a LGSIL pap 9/19 and no follow up until today.  She has fibroids, DUB, mild anemia, and a negative EMBX from last month.  She is taking megace.  She did not pick up the flagyl prescribed last month for the trich. She wants it called to a different pharmacy.  Review of Systems She has Essure but she has been abstinent for several months.    Objective:   Physical Exam Breathing, conversing, and ambulating normally Well nourished, well hydrated Black female, no apparent distress UPT negative, consent signed, time out done Cervix prepped with acetic acid. Transformation zone seen in its entirety. Colpo adequate. Changes c/w LGSIL seen in a circumferencial fashion at the os (acetowhite changes) The ectocervix was friable There was an area of punctation at the 9 o'clock position and an excoriated area at 12 o'clock. Both were biopsied. Silver nitrate achieved hemostasis. ECC obtained. She tolerated the procedure well.      Assessment & Plan:  H/O LGSIL pap 9/19- await pathology results Fibroids/anemia- continue megace Come back 2 weeks to discuss treatment options Trich- flagyl re- prescribed.

## 2019-05-25 NOTE — Progress Notes (Deleted)
Cardiology Office Note:   Date:  05/25/2019  NAME:  Nancy Dickerson    MRN: [id number] DOB:  1981/04/20   PCP:  Ladell Pier, MD  Cardiologist:  No primary care provider on file.  Electrophysiologist:  None   Referring MD: Emily Filbert, MD   No chief complaint on file. ***  History of Present Illness:   Nancy Dickerson is a 38 y.o. female with a hx of dysfunctional uterine bleeding, COPD, iron deficiency anemia who is being seen today for the evaluation of abnormal EKG at the request of Clovia Cuff, MD. she was seen in emergency room in July of this year with complaints of vaginal bleeding, and was noted to have nonspecific ST-T changes on her EKG.  ECG demonstrated normal sinus rhythm, no arrhythmias.  She has had low hemoglobin values around 8.0, and is taking iron supplementation as well as intermittent infusions for review of medical records.  No prior cardiac history noted.  Past Medical History: Past Medical History:  Diagnosis Date  . Anemia   . Asthma   . Bursitis of hip   . Cardiomegaly   . Chondromalacia of hip   . Chondromalacia of right knee   . COPD (chronic obstructive pulmonary disease) ([place])   . GERD (gastroesophageal reflux disease)   . H/O transfusion of packed red blood cells   . Knee pain   . Tinnitus     Past Surgical History: Past Surgical History:  Procedure Laterality Date  . NO PAST SURGERIES      Current Medications: No outpatient medications have been marked as taking for the 05/26/19 encounter (Appointment) with O'Neal, Cassie Freer, MD.     Allergies:    Patient has no known allergies.   Social History: Social History   Socioeconomic History  . Marital status: Single    Spouse name: Not on file  . Number of children: Not on file  . Years of education: Not on file  . Highest education level: Not on file  Occupational History  . Not on file  Social Needs  . Financial resource strain: Not on file  . Food insecurity    Worry: Not  on file    Inability: Not on file  . Transportation needs    Medical: Not on file    Non-medical: Not on file  Tobacco Use  . Smoking status: Never Smoker  . Smokeless tobacco: Never Used  Substance and Sexual Activity  . Alcohol use: Yes    Comment: rare  . Drug use: Yes    Types: Marijuana  . Sexual activity: Not on file  Lifestyle  . Physical activity    Days per week: Not on file    Minutes per session: Not on file  . Stress: Not on file  Relationships  . Social Herbalist on phone: Not on file    Gets together: Not on file    Attends religious service: Not on file    Active member of club or organization: Not on file    Attends meetings of clubs or organizations: Not on file    Relationship status: Not on file  Other Topics Concern  . Not on file  Social History Narrative   ** Merged History Encounter **         Family History: The patient's ***family history is not on file.  ROS:   All other ROS reviewed and negative. Pertinent positives noted in the HPI.  EKGs/Labs/Other Studies Reviewed:   The following studies were personally reviewed by me today:  EKG:  EKG is *** ordered today.  The ekg ordered today demonstrates ***, and was personally reviewed by me.   Recent Labs: 07/08/2018: TSH 0.768 05/13/2019: ALT 21; BUN 7; Creatinine, Ser 0.72; Hemoglobin 10.4; Platelets 393; Potassium 3.2; Sodium 135   Recent Lipid Panel    Component Value Date/Time   CHOL 115 01/30/2018 0355   TRIG 89 01/30/2018 0355   HDL 39 (L) 01/30/2018 0355   CHOLHDL 2.9 01/30/2018 0355   VLDL 18 01/30/2018 0355   LDLCALC 58 01/30/2018 0355    Physical Exam:   VS:  There were no vitals taken for this visit.   Wt Readings from Last 3 Encounters:  05/23/19 243 lb 8 oz (110.5 kg)  05/13/19 244 lb (110.7 kg)  05/01/19 244 lb 4.8 oz (110.8 kg)    General: Well nourished, well developed, in no acute distress Heart: Atraumatic, normal size  Eyes: PEERLA, EOMI  Neck:  Supple, no JVD Endocrine: No thryomegaly Cardiac: Normal S1, S2; RRR; no murmurs, rubs, or gallops Lungs: Clear to auscultation bilaterally, no wheezing, rhonchi or rales  Abd: Soft, nontender, no hepatomegaly  Ext: No edema, pulses 2+ Musculoskeletal: No deformities, BUE and BLE strength normal and equal Skin: Warm and dry, no rashes   Neuro: Alert and oriented to person, place, time, and situation, CNII-XII grossly intact, no focal deficits  Psych: Normal mood and affect   ASSESSMENT:   NAME@ is a 38 y.o. female who presents for the following: No diagnosis found.  PLAN:   There are no diagnoses linked to this encounter.  Disposition: No follow-ups on file.  Medication Adjustments/Labs and Tests Ordered: Current medicines are reviewed at length with the patient today.  Concerns regarding medicines are outlined above.  No orders of the defined types were placed in this encounter.  No orders of the defined types were placed in this encounter.   There are no Patient Instructions on file for this visit.   Signed, Addison Naegeli. Audie Box, [place]  [address] [place], [place] [address] [phone]  05/25/2019 6:41 PM

## 2019-05-26 ENCOUNTER — Ambulatory Visit: Payer: Self-pay | Admitting: Cardiovascular Disease

## 2019-05-29 ENCOUNTER — Encounter: Payer: Self-pay | Admitting: General Practice

## 2019-06-03 ENCOUNTER — Telehealth: Payer: Self-pay

## 2019-06-03 NOTE — Telephone Encounter (Signed)
Called pt in regards to her MyChart message about her bleeding.  When pt was asked about what her current dosage was of the Megace.  Pt stated " I am taking six tablets a day and its not working."  Pt stated that " I am already overweight and this medication is making me heavier."  Per chart review pt has an appt scheduled with Dr. Hulan Fray on 06/11/19.  I advised pt bleeding precautions and to f/u with Dr. Hulan Fray as she can discuss with her management of her bleeding.  Pt verbalized understanding.  Mel Almond, RN 06/03/19

## 2019-06-10 ENCOUNTER — Telehealth: Payer: Self-pay | Admitting: Medical

## 2019-06-10 NOTE — Telephone Encounter (Signed)
Called the patient to inform of the upcoming mychart visit. The patient verbalized understanding of how to log in. Also informed she will receive a call from our office prior to the appointment time.

## 2019-06-11 ENCOUNTER — Ambulatory Visit (INDEPENDENT_AMBULATORY_CARE_PROVIDER_SITE_OTHER): Payer: Self-pay | Admitting: Obstetrics & Gynecology

## 2019-06-11 ENCOUNTER — Other Ambulatory Visit: Payer: Self-pay

## 2019-06-11 ENCOUNTER — Encounter: Payer: Self-pay | Admitting: Obstetrics & Gynecology

## 2019-06-11 VITALS — BP 139/79 | HR 96 | Wt 244.8 lb

## 2019-06-11 DIAGNOSIS — D259 Leiomyoma of uterus, unspecified: Secondary | ICD-10-CM

## 2019-06-11 DIAGNOSIS — Z6841 Body Mass Index (BMI) 40.0 and over, adult: Secondary | ICD-10-CM

## 2019-06-11 DIAGNOSIS — N921 Excessive and frequent menstruation with irregular cycle: Secondary | ICD-10-CM

## 2019-06-11 DIAGNOSIS — N938 Other specified abnormal uterine and vaginal bleeding: Secondary | ICD-10-CM

## 2019-06-11 DIAGNOSIS — D069 Carcinoma in situ of cervix, unspecified: Secondary | ICD-10-CM

## 2019-06-11 NOTE — Progress Notes (Signed)
   Subjective:    Patient ID: Nancy Dickerson, female    DOB: 1981/01/06, 39 y.o.   MRN: [id number]  HPI 38 yo divorced P6 here to discuss her DUB and colpo pathology results. She is not taking the megace because it wasn't helping her bleeding.  Review of Systems She has Essure for contraception. She is not currently employed.    Objective:   Physical Exam Breathing, conversing, and ambulating normally Well nourished, well hydrated Black female, no apparent distress Abd- benign, obese     Assessment & Plan:  Dalbert Batman- got treated on 05-29-19, abstinent currently (since February 2020) DUB- she would like to avoid a hysterectomy at this time, would like a ablation I sent Drema Balzarine a message to schedule this CIN 2&3 on cervical biopsy- plan for [facility]

## 2019-06-18 ENCOUNTER — Encounter (HOSPITAL_BASED_OUTPATIENT_CLINIC_OR_DEPARTMENT_OTHER): Payer: Self-pay | Admitting: *Deleted

## 2019-06-18 ENCOUNTER — Other Ambulatory Visit: Payer: Self-pay

## 2019-06-21 ENCOUNTER — Other Ambulatory Visit (HOSPITAL_COMMUNITY)
Admission: RE | Admit: 2019-06-21 | Discharge: 2019-06-21 | Disposition: A | Discharge status: Home / Self Care | Payer: Medicaid Other | Source: Ambulatory Visit | Attending: Obstetrics & Gynecology | Admitting: Obstetrics & Gynecology

## 2019-06-21 DIAGNOSIS — Z01812 Encounter for preprocedural laboratory examination: Secondary | ICD-10-CM | POA: Insufficient documentation

## 2019-06-21 DIAGNOSIS — Z20828 Contact with and (suspected) exposure to other viral communicable diseases: Secondary | ICD-10-CM | POA: Insufficient documentation

## 2019-06-22 LAB — NOVEL CORONAVIRUS, NAA (HOSP ORDER, SEND-OUT TO REF LAB; TAT 18-24 HRS): SARS-CoV-2, NAA: NOT DETECTED

## 2019-06-24 ENCOUNTER — Encounter (HOSPITAL_BASED_OUTPATIENT_CLINIC_OR_DEPARTMENT_OTHER)
Admission: RE | Admit: 2019-06-24 | Discharge: 2019-06-24 | Disposition: A | Discharge status: Home / Self Care | Payer: Medicaid Other | Source: Ambulatory Visit | Attending: Obstetrics & Gynecology | Admitting: Obstetrics & Gynecology

## 2019-06-24 DIAGNOSIS — Z01812 Encounter for preprocedural laboratory examination: Secondary | ICD-10-CM | POA: Insufficient documentation

## 2019-06-24 LAB — POCT PREGNANCY, URINE: Preg Test, Ur: NEGATIVE

## 2019-06-24 NOTE — Anesthesia Preprocedure Evaluation (Addendum)
Anesthesia Evaluation  Patient identified by MRN, date of birth, ID band Patient awake    Reviewed: Allergy & Precautions, NPO status , Patient's Chart, lab work & pertinent test results  Airway Mallampati: II  TM Distance: >3 FB Neck ROM: Full    Dental no notable dental hx. (+) Teeth Intact   Pulmonary asthma , COPD,    Pulmonary exam normal breath sounds clear to auscultation       Cardiovascular Exercise Tolerance: Good Normal cardiovascular exam Rhythm:Regular Rate:Normal     Neuro/Psych TIA   GI/Hepatic Neg liver ROS, GERD  Medicated,  Endo/Other    Renal/GU negative Renal ROS     Musculoskeletal  (+) Arthritis ,   Abdominal   Peds  Hematology  (+) anemia ,   Anesthesia Other Findings   Reproductive/Obstetrics negative OB ROS                            Anesthesia Physical Anesthesia Plan  ASA: III  Anesthesia Plan: General   Post-op Pain Management:    Induction: Intravenous  PONV Risk Score and Plan: 4 or greater and Treatment may vary due to age or medical condition, Ondansetron and Dexamethasone  Airway Management Planned: LMA  Additional Equipment: None  Intra-op Plan:   Post-operative Plan:   Informed Consent: I have reviewed the patients History and Physical, chart, labs and discussed the procedure including the risks, benefits and alternatives for the proposed anesthesia with the patient or authorized representative who has indicated his/her understanding and acceptance.     Dental advisory given  Plan Discussed with:   Anesthesia Plan Comments:         Anesthesia Quick Evaluation

## 2019-06-25 ENCOUNTER — Encounter (HOSPITAL_BASED_OUTPATIENT_CLINIC_OR_DEPARTMENT_OTHER)
Admission: RE | Disposition: A | Discharge status: Home / Self Care | Payer: Self-pay | Source: Home / Self Care | Attending: Obstetrics & Gynecology

## 2019-06-25 ENCOUNTER — Ambulatory Visit (HOSPITAL_BASED_OUTPATIENT_CLINIC_OR_DEPARTMENT_OTHER): Payer: Medicaid Other | Admitting: Certified Registered"

## 2019-06-25 ENCOUNTER — Encounter (HOSPITAL_BASED_OUTPATIENT_CLINIC_OR_DEPARTMENT_OTHER): Payer: Self-pay | Admitting: Certified Registered"

## 2019-06-25 ENCOUNTER — Other Ambulatory Visit: Payer: Self-pay

## 2019-06-25 ENCOUNTER — Ambulatory Visit (HOSPITAL_BASED_OUTPATIENT_CLINIC_OR_DEPARTMENT_OTHER)
Admission: RE | Admit: 2019-06-25 | Discharge: 2019-06-25 | Disposition: A | Discharge status: Home / Self Care | Payer: Medicaid Other | Attending: Obstetrics & Gynecology | Admitting: Obstetrics & Gynecology

## 2019-06-25 DIAGNOSIS — M94261 Chondromalacia, right knee: Secondary | ICD-10-CM | POA: Insufficient documentation

## 2019-06-25 DIAGNOSIS — H9319 Tinnitus, unspecified ear: Secondary | ICD-10-CM | POA: Diagnosis not present

## 2019-06-25 DIAGNOSIS — M94259 Chondromalacia, unspecified hip: Secondary | ICD-10-CM | POA: Diagnosis not present

## 2019-06-25 DIAGNOSIS — J45909 Unspecified asthma, uncomplicated: Secondary | ICD-10-CM | POA: Insufficient documentation

## 2019-06-25 DIAGNOSIS — I517 Cardiomegaly: Secondary | ICD-10-CM | POA: Insufficient documentation

## 2019-06-25 DIAGNOSIS — N840 Polyp of corpus uteri: Secondary | ICD-10-CM

## 2019-06-25 DIAGNOSIS — N871 Moderate cervical dysplasia: Secondary | ICD-10-CM

## 2019-06-25 DIAGNOSIS — D649 Anemia, unspecified: Secondary | ICD-10-CM | POA: Insufficient documentation

## 2019-06-25 DIAGNOSIS — K219 Gastro-esophageal reflux disease without esophagitis: Secondary | ICD-10-CM | POA: Insufficient documentation

## 2019-06-25 DIAGNOSIS — R87613 High grade squamous intraepithelial lesion on cytologic smear of cervix (HGSIL): Secondary | ICD-10-CM | POA: Insufficient documentation

## 2019-06-25 DIAGNOSIS — D069 Carcinoma in situ of cervix, unspecified: Secondary | ICD-10-CM | POA: Diagnosis not present

## 2019-06-25 DIAGNOSIS — N938 Other specified abnormal uterine and vaginal bleeding: Secondary | ICD-10-CM | POA: Insufficient documentation

## 2019-06-25 DIAGNOSIS — N870 Mild cervical dysplasia: Secondary | ICD-10-CM | POA: Diagnosis not present

## 2019-06-25 DIAGNOSIS — J449 Chronic obstructive pulmonary disease, unspecified: Secondary | ICD-10-CM | POA: Diagnosis not present

## 2019-06-25 DIAGNOSIS — D500 Iron deficiency anemia secondary to blood loss (chronic): Secondary | ICD-10-CM | POA: Diagnosis not present

## 2019-06-25 DIAGNOSIS — Z79899 Other long term (current) drug therapy: Secondary | ICD-10-CM | POA: Diagnosis not present

## 2019-06-25 DIAGNOSIS — Z791 Long term (current) use of non-steroidal anti-inflammatories (NSAID): Secondary | ICD-10-CM | POA: Insufficient documentation

## 2019-06-25 DIAGNOSIS — Z7951 Long term (current) use of inhaled steroids: Secondary | ICD-10-CM | POA: Insufficient documentation

## 2019-06-25 HISTORY — PX: HYSTEROSCOPY W/D&C: SHX1775

## 2019-06-25 HISTORY — PX: CERVICAL CONIZATION W/BX: SHX1330

## 2019-06-25 HISTORY — PX: ENDOMETRIAL ABLATION: SHX621

## 2019-06-25 SURGERY — CONE BIOPSY, CERVIX
Anesthesia: General

## 2019-06-25 MED ORDER — IODINE STRONG (LUGOLS) 5 % PO SOLN
ORAL | Status: AC
  Filled 2019-06-25: qty 1

## 2019-06-25 MED ORDER — SILVER NITRATE-POT NITRATE 75-25 % EX MISC
CUTANEOUS | Status: AC
  Filled 2019-06-25: qty 1

## 2019-06-25 MED ORDER — SODIUM CHLORIDE 0.9% IV SOLUTION: Freq: Once | INTRAVENOUS | Status: DC

## 2019-06-25 MED ORDER — PHENYLEPHRINE 40 MCG/ML (10ML) SYRINGE FOR IV PUSH (FOR BLOOD PRESSURE SUPPORT)
PREFILLED_SYRINGE | INTRAVENOUS | Status: AC
  Filled 2019-06-25: qty 10

## 2019-06-25 MED ORDER — PROPOFOL 10 MG/ML IV BOLUS
INTRAVENOUS | Status: AC
  Filled 2019-06-25: qty 20

## 2019-06-25 MED ORDER — LIDOCAINE 2% (20 MG/ML) 5 ML SYRINGE
INTRAMUSCULAR | Status: DC | PRN
  Administered 2019-06-25: 100 mg via INTRAVENOUS

## 2019-06-25 MED ORDER — ONDANSETRON HCL 4 MG/2ML IJ SOLN
INTRAMUSCULAR | Status: DC | PRN
  Administered 2019-06-25: 4 mg via INTRAVENOUS

## 2019-06-25 MED ORDER — OXYCODONE HCL 5 MG/5ML PO SOLN: 5.0000 mg | Freq: Once | ORAL | Status: AC | PRN

## 2019-06-25 MED ORDER — ONDANSETRON HCL 4 MG/2ML IJ SOLN: 4.0000 mg | Freq: Once | INTRAMUSCULAR | Status: DC | PRN

## 2019-06-25 MED ORDER — BUPIVACAINE HCL (PF) 0.5 % IJ SOLN
INTRAMUSCULAR | Status: AC
  Filled 2019-06-25: qty 30

## 2019-06-25 MED ORDER — ONDANSETRON HCL 4 MG/2ML IJ SOLN
INTRAMUSCULAR | Status: AC
  Filled 2019-06-25: qty 2

## 2019-06-25 MED ORDER — FENTANYL CITRATE (PF) 100 MCG/2ML IJ SOLN
INTRAMUSCULAR | Status: AC
  Filled 2019-06-25: qty 2

## 2019-06-25 MED ORDER — IODINE STRONG (LUGOLS) 5 % PO SOLN
ORAL | Status: DC | PRN
  Administered 2019-06-25: 0.1 mL via ORAL

## 2019-06-25 MED ORDER — FENTANYL CITRATE (PF) 100 MCG/2ML IJ SOLN
50.0000 ug | INTRAMUSCULAR | Status: DC | PRN
  Administered 2019-06-25 (×2): 50 ug via INTRAVENOUS

## 2019-06-25 MED ORDER — PROPOFOL 10 MG/ML IV BOLUS
INTRAVENOUS | Status: DC | PRN
  Administered 2019-06-25: 170 mg via INTRAVENOUS

## 2019-06-25 MED ORDER — MIDAZOLAM HCL 2 MG/2ML IJ SOLN: 1.0000 mg | INTRAMUSCULAR | Status: DC | PRN

## 2019-06-25 MED ORDER — KETOROLAC TROMETHAMINE 30 MG/ML IJ SOLN
30.0000 mg | Freq: Once | INTRAMUSCULAR | Status: AC
  Administered 2019-06-25: 30 mg via INTRAVENOUS

## 2019-06-25 MED ORDER — FENTANYL CITRATE (PF) 250 MCG/5ML IJ SOLN
INTRAMUSCULAR | Status: DC | PRN
  Administered 2019-06-25: 50 ug via INTRAVENOUS
  Administered 2019-06-25 (×2): 25 ug via INTRAVENOUS

## 2019-06-25 MED ORDER — FERRIC SUBSULFATE 259 MG/GM EX SOLN
CUTANEOUS | Status: AC
  Filled 2019-06-25: qty 8

## 2019-06-25 MED ORDER — OXYCODONE-ACETAMINOPHEN 5-325 MG PO TABS: 1.0000 | ORAL_TABLET | Freq: Four times a day (QID) | ORAL | 0 refills | Status: DC | PRN

## 2019-06-25 MED ORDER — OXYCODONE HCL 5 MG PO TABS
5.0000 mg | ORAL_TABLET | Freq: Once | ORAL | Status: AC | PRN
  Administered 2019-06-25: 5 mg via ORAL

## 2019-06-25 MED ORDER — OXYCODONE HCL 5 MG PO TABS
ORAL_TABLET | ORAL | Status: AC
  Filled 2019-06-25: qty 1

## 2019-06-25 MED ORDER — LACTATED RINGERS IV SOLN
INTRAVENOUS | Status: DC
  Administered 2019-06-25: 08:00:00 via INTRAVENOUS

## 2019-06-25 MED ORDER — KETOROLAC TROMETHAMINE 15 MG/ML IJ SOLN: 30.0000 mg | Freq: Once | INTRAMUSCULAR | Status: DC

## 2019-06-25 MED ORDER — DEXAMETHASONE SODIUM PHOSPHATE 10 MG/ML IJ SOLN
INTRAMUSCULAR | Status: AC
  Filled 2019-06-25: qty 1

## 2019-06-25 MED ORDER — MEPERIDINE HCL 25 MG/ML IJ SOLN: 6.2500 mg | INTRAMUSCULAR | Status: DC | PRN

## 2019-06-25 MED ORDER — ACETAMINOPHEN 500 MG PO TABS
ORAL_TABLET | ORAL | Status: AC
  Filled 2019-06-25: qty 2

## 2019-06-25 MED ORDER — LIDOCAINE 2% (20 MG/ML) 5 ML SYRINGE
INTRAMUSCULAR | Status: AC
  Filled 2019-06-25: qty 5

## 2019-06-25 MED ORDER — HYDROMORPHONE HCL 1 MG/ML IJ SOLN
0.2500 mg | INTRAMUSCULAR | Status: DC | PRN
  Administered 2019-06-25 (×2): 0.5 mg via INTRAVENOUS

## 2019-06-25 MED ORDER — ACETAMINOPHEN 500 MG PO TABS
1000.0000 mg | ORAL_TABLET | Freq: Once | ORAL | Status: AC
  Administered 2019-06-25: 1000 mg via ORAL

## 2019-06-25 MED ORDER — DEXAMETHASONE SODIUM PHOSPHATE 10 MG/ML IJ SOLN
INTRAMUSCULAR | Status: DC | PRN
  Administered 2019-06-25: 5 mg via INTRAVENOUS

## 2019-06-25 MED ORDER — ACETAMINOPHEN 10 MG/ML IV SOLN: 1000.0000 mg | Freq: Once | INTRAVENOUS | Status: DC | PRN

## 2019-06-25 MED ORDER — EPHEDRINE 5 MG/ML INJ
INTRAVENOUS | Status: AC
  Filled 2019-06-25: qty 10

## 2019-06-25 MED ORDER — KETOROLAC TROMETHAMINE 30 MG/ML IJ SOLN
INTRAMUSCULAR | Status: AC
  Filled 2019-06-25: qty 1

## 2019-06-25 MED ORDER — HYDROMORPHONE HCL 1 MG/ML IJ SOLN
INTRAMUSCULAR | Status: AC
  Filled 2019-06-25: qty 0.5

## 2019-06-25 MED ORDER — ROCURONIUM BROMIDE 10 MG/ML (PF) SYRINGE
PREFILLED_SYRINGE | INTRAVENOUS | Status: AC
  Filled 2019-06-25: qty 10

## 2019-06-25 MED ORDER — BUPIVACAINE HCL 0.5 % IJ SOLN
INTRAMUSCULAR | Status: DC | PRN
  Administered 2019-06-25: 30 mL

## 2019-06-25 MED ORDER — MIDAZOLAM HCL 2 MG/2ML IJ SOLN
INTRAMUSCULAR | Status: AC
  Filled 2019-06-25: qty 2

## 2019-06-25 MED ORDER — SUCCINYLCHOLINE CHLORIDE 200 MG/10ML IV SOSY
PREFILLED_SYRINGE | INTRAVENOUS | Status: AC
  Filled 2019-06-25: qty 10

## 2019-06-25 SURGICAL SUPPLY — 43 items
ABLATOR SURESOUND NOVASURE (ABLATOR) IMPLANT
BLADE SURG 11 STRL SS (BLADE) ×3 IMPLANT
BRIEF STRETCH FOR OB PAD XXL (UNDERPADS AND DIAPERS) ×3 IMPLANT
CANISTER SUCT 3000ML PPV (MISCELLANEOUS) IMPLANT
CLEANER CAUTERY TIP 5X5 PAD (MISCELLANEOUS) ×2 IMPLANT
CLOTH BEACON ORANGE TIMEOUT ST (SAFETY) IMPLANT
COUNTER NEEDLE 1200 MAGNETIC (NEEDLE) IMPLANT
DILATOR CANAL MILEX (MISCELLANEOUS) IMPLANT
DRSG TELFA 3X8 NADH (GAUZE/BANDAGES/DRESSINGS) IMPLANT
ELECT BALL LEEP 5MM RED (ELECTRODE) ×3 IMPLANT
ELECT REM PT RETURN 9FT ADLT (ELECTROSURGICAL) ×3
ELECTRODE REM PT RTRN 9FT ADLT (ELECTROSURGICAL) ×2 IMPLANT
GAUZE 4X4 16PLY RFD (DISPOSABLE) ×3 IMPLANT
GLOVE BIO SURGEON STRL SZ 6.5 (GLOVE) ×3 IMPLANT
GLOVE BIO SURGEON STRL SZ7 (GLOVE) ×3 IMPLANT
GLOVE BIOGEL PI IND STRL 6 (GLOVE) ×2 IMPLANT
GLOVE BIOGEL PI IND STRL 7.0 (GLOVE) ×2 IMPLANT
GLOVE BIOGEL PI INDICATOR 6 (GLOVE) ×1
GLOVE BIOGEL PI INDICATOR 7.0 (GLOVE) ×1
GOWN STRL REUS W/ TWL XL LVL3 (GOWN DISPOSABLE) ×2 IMPLANT
GOWN STRL REUS W/TWL LRG LVL3 (GOWN DISPOSABLE) ×6 IMPLANT
GOWN STRL REUS W/TWL XL LVL3 (GOWN DISPOSABLE) ×3
HANDPIECE ABLA MINERVA ENDO (MISCELLANEOUS) IMPLANT
IV NS IRRIG 3000ML ARTHROMATIC (IV SOLUTION) ×1 IMPLANT
KIT PROCEDURE FLUENT (KITS) ×3 IMPLANT
NDL SPNL 18GX3.5 QUINCKE PK (NEEDLE) ×2 IMPLANT
NEEDLE SPNL 18GX3.5 QUINCKE PK (NEEDLE) IMPLANT
PACK VAGINAL MINOR WOMEN LF (CUSTOM PROCEDURE TRAY) ×3 IMPLANT
PAD CLEANER CAUTERY TIP 5X5 (MISCELLANEOUS) ×1
PAD DRESSING TELFA 3X8 NADH (GAUZE/BANDAGES/DRESSINGS) ×2 IMPLANT
PAD OB MATERNITY 4.3X12.25 (PERSONAL CARE ITEMS) ×3 IMPLANT
PAD PREP 24X48 CUFFED NSTRL (MISCELLANEOUS) ×3 IMPLANT
PENCIL BUTTON HOLSTER BLD 10FT (ELECTRODE) ×3 IMPLANT
SCOPETTES 8  STERILE (MISCELLANEOUS) ×1
SCOPETTES 8 STERILE (MISCELLANEOUS) ×2 IMPLANT
SEAL ROD LENS SCOPE MYOSURE (ABLATOR) ×1 IMPLANT
SLEEVE SCD COMPRESS KNEE MED (MISCELLANEOUS) ×5 IMPLANT
SPONGE SURGIFOAM ABS GEL 12-7 (HEMOSTASIS) ×3 IMPLANT
SUT VIC AB 0 CT1 27 (SUTURE) ×3
SUT VIC AB 0 CT1 27XBRD ANBCTR (SUTURE) ×4 IMPLANT
SYR 30ML LL (SYRINGE) ×3 IMPLANT
TOWEL GREEN STERILE FF (TOWEL DISPOSABLE) ×6 IMPLANT
YANKAUER SUCT BULB TIP NO VENT (SUCTIONS) ×3 IMPLANT

## 2019-06-25 NOTE — Transfer of Care (Signed)
Immediate Anesthesia Transfer of Care Note  Patient: Nancy Dickerson  Procedure(s) Performed: CONIZATION CERVIX WITH BIOPSY - COLD KNIFE (N/A ) MINERVA ABLATION (N/A ) DILATATION AND CURETTAGE /HYSTEROSCOPY  Patient Location: PACU  Anesthesia Type:General  Level of Consciousness: awake, alert , oriented and patient cooperative  Airway & Oxygen Therapy: Patient Spontanous Breathing and Patient connected to nasal cannula oxygen  Post-op Assessment: Report given to RN, Post -op Vital signs reviewed and stable and Patient moving all extremities  Post vital signs: Reviewed and stable  Last Vitals:  Vitals Value Taken Time  BP 119/75 06/25/19 0953  Temp    Pulse 72 06/25/19 0955  Resp 16 06/25/19 0955  SpO2 100 % 06/25/19 0955  Vitals shown include unvalidated device data.  Last Pain:  Vitals:   06/25/19 0737  TempSrc: Oral  PainSc: 0-No pain         Complications: No apparent anesthesia complications

## 2019-06-25 NOTE — H&P (Addendum)
Nancy Dickerson is an 38 y.o. 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 Her u/s showed a large fibroid but she does not want a hysterectomy. She had a Essure in the past for contraception. Her bleeding is unpredictable, sometimes heavy, sometimes light. She has tried megace, but this didn't help. Depo likewise didn't help, only made her gain weight.  She had an infusion of iron x 2, last in July. Her hbg was 8, now 10.5.   Menstrual History: Menarche age: 26 No LMP recorded. (Menstrual status: Other).    Past Medical History:  Diagnosis Date  . Anemia   . Asthma   . Bursitis of hip   . Cardiomegaly   . Cardiomegaly   . Chondromalacia of hip   . Chondromalacia of right knee   . COPD (chronic obstructive pulmonary disease) ([place])   . GERD (gastroesophageal reflux disease)   . H/O transfusion of packed red blood cells   . Knee pain   . Tinnitus     Past Surgical History:  Procedure Laterality Date  . NO PAST SURGERIES      History reviewed. No pertinent family history.  Social History:  reports that she has never smoked. She has never used smokeless tobacco. She reports current alcohol use. She reports current drug use. Drug: Marijuana.  Allergies: No Known Allergies  Medications Prior to Admission  Medication Sig Dispense Refill Last Dose  . fluticasone (FLOVENT HFA) 110 MCG/ACT inhaler Inhale into the lungs 2 (two) times daily.   06/23/2019 at Unknown time  . Multiple Vitamin (MULTIVITAMIN) capsule Take 1 capsule by mouth daily.   06/18/2019 at Unknown time  . naproxen (NAPROSYN) 500 MG tablet Take 1 tablet (500 mg total) by mouth 2 (two) times daily. 10 tablet 0 06/24/2019 at Unknown time  . NON FORMULARY as needed.   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 rate 20,  height 5\' 4"  (1.626 m), weight 111.7 kg, SpO2 100 %. Physical Exam  Breathing, conversing, and ambulating normally Well nourished, well hydrated Black female, no apparent distress Heart- rrr Lungs- CTAB Abd- benign  Results for orders placed or performed during the hospital encounter of 06/24/19 (from the past 24 hour(s))  Pregnancy, urine POC     Status: None   Collection Time: 06/24/19 12:54 PM  Result Value Ref Range   Preg Test, Ur NEGATIVE NEGATIVE    No results found.  Assessment/Plan:  Dalbert Batman- got treated on 05-29-19, abstinent currently (since February 2020) DUB- she would like to avoid a hysterectomy at this time, would like a ablation CIN 2&3 on cervical biopsy- plan for CKC  She understands the risks of surgery, including, but not to infection, bleeding, DVTs, damage to bowel, bladder, ureters. She wishes to proceed.     Emily Filbert 06/25/2019, 8:12 AM

## 2019-06-25 NOTE — Discharge Instructions (Signed)
Cervical Conization, Care After This sheet gives you information about how to care for yourself after your procedure. Your doctor may also give you more specific instructions. If you have problems or questions, contact your doctor. Follow these instructions at home: Medicines   Take over-the-counter and prescription medicines only as told by your doctor.  Do not take aspirin until your doctor says it is okay.  If you take pain medicine: ? You may have constipation. To help treat this, your doctor may tell you to:  Drink enough fluid to keep your pee (urine) clear or pale yellow.  Take medicines.  Eat foods that are high in fiber. These include fresh fruits and vegetables, whole grains, bran, and beans.  Limit foods that are high in fat and sugar. These include fried foods and sweet foods. ? Do not drive or use heavy machines. General instructions  You can eat your usual diet unless your doctor tells you not to do so.  Take showers for the first week. Do not take baths, swim, or use hot tubs until your doctor says it is okay.  Do not douche, use tampons, or have sex until your doctor says it is okay.  For 7-14 days after your procedure, avoid: ? Being very active. ? Exercising. ? Heavy lifting.  Keep all follow-up visits as told by your doctor. This is important. Contact a doctor if:  You have a rash.  You are dizzy or lightheaded.  You feel sick to your stomach (nauseous).  You throw up (vomit).  You have fluid from your vagina (vaginal discharge) that smells bad. Get help right away if:  There are blood clots coming from your vagina.  You have more bleeding than you would have in a normal period. For example, you soak a pad in less than 1 hour.  You have a fever.  You have more and more cramps.  You pass out (faint).  You have pain when peeing.  Your have a lot of pain.  Your pain gets worse.  Your pain does not get better when you take your  medicine.  You have blood in your pee.  You throw up (vomit). Summary  After your procedure, take over-the-counter and prescription medicines only as told by your doctor.  Do not douche, use tampons, or have sex until your doctor says it is okay.  For about 7-14 days after your procedure, try not to exercise or lift heavy objects.  Get help right away if you have new symptoms, or if your symptoms become worse. This information is not intended to replace advice given to you by your health care provider. Make sure you discuss any questions you have with your health care provider. Document Released: 07/04/2008 Document Revised: 09/07/2017 Document Reviewed: 09/27/2016 Elsevier Patient Education  [place]. Dilation and Curettage or Vacuum Curettage, Care After This sheet gives you information about how to care for yourself after your procedure. Your health care provider may also give you more specific instructions. If you have problems or questions, contact your health care provider. What can I expect after the procedure? After your procedure, it is common to have:  Mild pain or cramping.  Some vaginal bleeding or spotting. These may last for up to 2 weeks after your procedure. Follow these instructions at home: Activity   Do not drive or use heavy machinery while taking prescription pain medicine.  Avoid driving for the first 24 hours after your procedure.  Take frequent, short walks, followed  by rest periods, throughout the day. Ask your health care provider what activities are safe for you. After 1-2 days, you may be able to return to your normal activities.  Do not lift anything heavier than 10 lb (4.5 kg) until your health care provider approves.  For at least 2 weeks, or as long as told by your health care provider, do not: ? Douche. ? Use tampons. ? Have sexual intercourse. General instructions   Take over-the-counter and prescription medicines only as told  by your health care provider. This is especially important if you take blood thinning medicine.  Do not take baths, swim, or use a hot tub until your health care provider approves. Take showers instead of baths.  Wear compression stockings as told by your health care provider. These stockings help to prevent blood clots and reduce swelling in your legs.  It is your responsibility to get the results of your procedure. Ask your health care provider, or the department performing the procedure, when your results will be ready.  Keep all follow-up visits as told by your health care provider. This is important. Contact a health care provider if:  You have severe cramps that get worse or that do not get better with medicine.  You have severe abdominal pain.  You cannot drink fluids without vomiting.  You develop pain in a different area of your pelvis.  You have bad-smelling vaginal discharge.  You have a rash. Get help right away if:  You have vaginal bleeding that soaks more than one sanitary pad in 1 hour, for 2 hours in a row.  You pass large blood clots from your vagina.  You have a fever that is above 100.6F (38.0C).  Your abdomen feels very tender or hard.  You have chest pain.  You have shortness of breath.  You cough up blood.  You feel dizzy or light-headed.  You faint.  You have pain in your neck or shoulder area. This information is not intended to replace advice given to you by your health care provider. Make sure you discuss any questions you have with your health care provider. Document Released: 09/22/2000 Document Revised: 09/07/2017 Document Reviewed: 04/27/2016 Elsevier Patient Education  [place] Instructions  Activity: Get plenty of rest for the remainder of the day. A responsible individual must stay with you for 24 hours following the procedure.  For the next 24 hours, DO NOT: -Drive a car -Conservation officer, nature -Drink alcoholic beverages -Take any medication unless instructed by your physician -Make any legal decisions or sign important papers.  Meals: Start with liquid foods such as gelatin or soup. Progress to regular foods as tolerated. Avoid greasy, spicy, heavy foods. If nausea and/or vomiting occur, drink only clear liquids until the nausea and/or vomiting subsides. Call your physician if vomiting continues.  Special Instructions/Symptoms: Your throat may feel dry or sore from the anesthesia or the breathing tube placed in your throat during surgery. If this causes discomfort, gargle with warm salt water. The discomfort should disappear within 24 hours.  If you had a scopolamine patch placed behind your ear for the management of post- operative nausea and/or vomiting:  1. The medication in the patch is effective for 72 hours, after which it should be removed.  Wrap patch in a tissue and discard in the trash. Wash hands thoroughly with soap and water. 2. You may remove the patch earlier than 72 hours if you experience unpleasant side  effects which may include dry mouth, dizziness or visual disturbances. 3. Avoid touching the patch. Wash your hands with soap and water after contact with the patch.      YOU WERE GIVEN OXYCODONE 5 MG AT 12:00 NOON, MAY REPEAT PAIN MEDICINE AFTER 6:00 PM

## 2019-06-25 NOTE — Anesthesia Procedure Notes (Signed)
Procedure Name: LMA Insertion Date/Time: 06/25/2019 8:43 AM Performed by: Myna Bright, CRNA Pre-anesthesia Checklist: Patient identified, Emergency Drugs available, Suction available and Patient being monitored Patient Re-evaluated:Patient Re-evaluated prior to induction Oxygen Delivery Method: Circle system utilized Preoxygenation: Pre-oxygenation with 100% oxygen Induction Type: IV induction Ventilation: Mask ventilation without difficulty LMA: LMA inserted LMA Size: 4.0 Tube type: Oral Number of attempts: 1 Placement Confirmation: positive ETCO2 and breath sounds checked- equal and bilateral Tube secured with: Tape Dental Injury: Teeth and Oropharynx as per pre-operative assessment

## 2019-06-25 NOTE — Anesthesia Postprocedure Evaluation (Signed)
Anesthesia Post Note  Patient: Nancy Dickerson  Procedure(s) Performed: CONIZATION CERVIX WITH BIOPSY - COLD KNIFE (N/A ) MINERVA ABLATION (N/A ) DILATATION AND CURETTAGE /HYSTEROSCOPY     Patient location during evaluation: PACU Anesthesia Type: General Level of consciousness: awake and alert Pain management: pain level controlled Vital Signs Assessment: post-procedure vital signs reviewed and stable Respiratory status: spontaneous breathing, nonlabored ventilation, respiratory function stable and patient connected to nasal cannula oxygen Cardiovascular status: blood pressure returned to baseline and stable Postop Assessment: no apparent nausea or vomiting Anesthetic complications: no    Last Vitals:  Vitals:   06/25/19 1141 06/25/19 1220  BP: 130/86 134/84  Pulse: 71 81  Resp:  16  Temp: 37.1 C 36.6 C  SpO2: 100% 100%    Last Pain:  Vitals:   06/25/19 1220  TempSrc:   PainSc: 4                  Barnet Glasgow

## 2019-06-25 NOTE — Op Note (Signed)
06/25/2019  9:47 AM  PATIENT:  Nancy Dickerson  38 y.o. 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  POST-OPERATIVE DIAGNOSIS:  CIN2-3 DUB  PROCEDURE:  Hysteroscopy, dilation and curettage, Minerva endometrial ablation, cone biopsy of cervix, frozen section of endometrial curettings  SURGEON:  Surgeon(s) and Role:    * [place] Ice C, MD - Primary   ANESTHESIA:   local and general  EBL:  50 mL   BLOOD ADMINISTERED:none  DRAINS: none   LOCAL MEDICATIONS USED:  MARCAINE     SPECIMEN:  Source of Specimen:  uterine curettings, cone biopsy of cervix, and endocervical curettings  DISPOSITION OF SPECIMEN:  PATHOLOGY  COUNTS:  YES  TOURNIQUET:  * No tourniquets in log *  DICTATION: .Dragon Dictation  PLAN OF CARE: Discharge to home after PACU  PATIENT DISPOSITION:  PACU - hemodynamically stable.   Delay start of Pharmacological VTE agent (>24hrs) due to surgical blood loss or risk of bleeding: not applicable  The risks, benefits, and alternatives of surgery were explained, understood, and accepted. All questions were answered. Consents were signed. In the operating room general anesthesia was applied without complication, and she was placed in the dorsal lithotomy position. Her vagina was prepped and draped in the usual sterile fashion.  A bimanual exam revealed a normal size and shape, anteverted mobile uterus with grade 3 prolapse with a patulous cervix. Her adnexa were nonenlarged. Her pubic arch is fair if she needs a vaginal hysterectomy in the future. A speculum was placed and a single-tooth tenaculum was used to grasp the anterior lip of her cervix. A total of 30 mL of 0.5% Marcaine was used to perform a paracervical block. Her uterus sounded to 11 cm and her cervical length was 4.5 cm. Her cervix was natuarally dilated to accommodate a hysteroscope. Hysteroscopy showed a collection of polypoid tissue on the anterior and posterior surfaces of the uterus. This looked slightly  unusual, so I sent a sample for a frozen section. It was read as benign.  I then proceeded with the Minerva procedure. In order to obtain a seal of her cervix, I placed a single tooth tenaculum on each side of the cervix.  The procedure proceeded as expected. There was no bleeding noted at the end of the case. I then proceeded with the cone biopsy portion of the case.  A weighted speculum was placed in the posterior aspect the vagina and the cervix was visualized. Lugol's solution was generously applied to the cervix. There was a small non-staining area just on the outside of the os. The anterior lip of the cervix was grasped with a single-tooth tenaculum.  A cone shaped specimen of the cervix was removed and endocervical curettings were obtained. The cone bed was cauterized with the Bovie. Hemostasis was noted. A small piece of gelfoam was placed on the cone bed.  A pursestring closure of the cervix was done with a 0 Vicryl suture. Excellent hemostasis was noted. The instrument sponge, and needle counts were correct. She was taken to the recovery room in stable condition. She tolerated the procedure well.

## 2019-06-26 ENCOUNTER — Encounter (HOSPITAL_BASED_OUTPATIENT_CLINIC_OR_DEPARTMENT_OTHER): Payer: Self-pay | Admitting: Obstetrics & Gynecology

## 2019-06-26 LAB — SURGICAL PATHOLOGY

## 2019-07-01 ENCOUNTER — Other Ambulatory Visit: Payer: Self-pay

## 2019-07-01 ENCOUNTER — Encounter: Payer: Self-pay | Admitting: Internal Medicine

## 2019-07-01 DIAGNOSIS — G8918 Other acute postprocedural pain: Secondary | ICD-10-CM

## 2019-07-01 MED ORDER — IBUPROFEN 800 MG PO TABS: 800.0000 mg | ORAL_TABLET | Freq: Three times a day (TID) | ORAL | 0 refills | Status: DC | PRN

## 2019-07-07 ENCOUNTER — Encounter: Payer: Self-pay | Admitting: Internal Medicine

## 2019-07-07 ENCOUNTER — Other Ambulatory Visit: Payer: Self-pay

## 2019-07-07 ENCOUNTER — Ambulatory Visit: Payer: Medicaid Other | Attending: Internal Medicine | Admitting: Internal Medicine

## 2019-07-07 VITALS — BP 136/88 | HR 96 | Temp 98.2°F | Resp 18 | Ht 64.0 in | Wt 252.0 lb

## 2019-07-07 DIAGNOSIS — Z2821 Immunization not carried out because of patient refusal: Secondary | ICD-10-CM

## 2019-07-07 DIAGNOSIS — G4700 Insomnia, unspecified: Secondary | ICD-10-CM | POA: Insufficient documentation

## 2019-07-07 DIAGNOSIS — M5416 Radiculopathy, lumbar region: Secondary | ICD-10-CM | POA: Insufficient documentation

## 2019-07-07 MED ORDER — GABAPENTIN 300 MG PO CAPS: ORAL_CAPSULE | ORAL | 3 refills | Status: DC

## 2019-07-07 MED FILL — GABAPENTIN 300 MG CAPSULE: 300 | 33 days supply | Qty: 60 | Fill #0

## 2019-07-07 NOTE — Progress Notes (Signed)
Patient ID: Nancy Dickerson, female    DOB: 05/16/1981  MRN: [id number]  CC: Medication Refill   Subjective: Nancy Dickerson is a 38 y.o. female who presents for chronic ds management Her concerns today include:  Patient with history of iron deficiency anemia and uterine fibroids  Pt c/o problem with her RT leg x 2 mths..  Starts in lower back.  Came on gradually then got stronger.  Goes and comes.   Can not walk more than 5-10 mins or work out due to  leg giving out and going numb x 2 mths -pain starts in R lower back and goes down to knee. Numbness in entire leg Worse with  Standing for 5 mins, walking, when having BM No initiating factors Tried Ibuprofen, Naprosyn, steroid.  Nothing works Has not worked in 2 mths.  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 She is working on trying to get medicaid.  No insurance  C/o problems sleeping x 1 mth Stress contributes In bed 8:30-9 p.m. Fall asleep around 12 a.m then gets up between 4-5 AM "then I be up.".  Tries to drink water, look at phone to help her fall asleep.  She does not drink any caffeinated beverages or excess alcohol at bedtime tried Melatonin over-the-counter and did not find it helpful States she was on Ambien in the past.  Patient Active Problem List   Diagnosis Date Noted  . Uterine leiomyoma 03/12/2018  . Trochanteric bursitis of right hip 03/12/2018  . Class 3 severe obesity due to excess calories without serious comorbidity with body mass index (BMI) of 40.0 to 44.9 in adult ([place]) 03/12/2018  . Iron deficiency anemia due to chronic blood loss 01/29/2018  . Hypokalemia 01/29/2018  . Right sided weakness 01/29/2018     Current Outpatient Medications on File Prior to Visit  Medication Sig Dispense Refill  . fluticasone (FLOVENT HFA) 110 MCG/ACT inhaler Inhale into the lungs 2 (two) times daily.    Marland Kitchen ibuprofen (ADVIL) 800 MG  tablet Take 1 tablet (800 mg total) by mouth every 8 (eight) hours as needed. 30 tablet 0  . Multiple Vitamin (MULTIVITAMIN) capsule Take 1 capsule by mouth daily.    . naproxen (NAPROSYN) 500 MG tablet Take 1 tablet (500 mg total) by mouth 2 (two) times daily. 10 tablet 0  . NON FORMULARY as needed.    Marland Kitchen oxyCODONE-acetaminophen (PERCOCET/ROXICET) 5-325 MG tablet Take 1 tablet by mouth every 6 (six) hours as needed. 10 tablet 0   No current facility-administered medications on file prior to visit.     No Known Allergies  Social History   Socioeconomic History  . Marital status: Single    Spouse name: Not on file  . Number of children: Not on file  . Years of education: Not on file  . Highest education level: Not on file  Occupational History  . Not on file  Social Needs  . Financial resource strain: Not on file  . Food insecurity    Worry: Not on file    Inability: Not on file  . Transportation needs    Medical: Not on file    Non-medical: Not on file  Tobacco Use  . Smoking status: Never Smoker  . Smokeless tobacco: Never Used  Substance and Sexual Activity  . Alcohol use: Yes    Comment: rare  . Drug use: Yes    Types:  Marijuana  . Sexual activity: Not on file  Lifestyle  . Physical activity    Days per week: Not on file    Minutes per session: Not on file  . Stress: Not on file  Relationships  . Social Herbalist on phone: Not on file    Gets together: Not on file    Attends religious service: Not on file    Active member of club or organization: Not on file    Attends meetings of clubs or organizations: Not on file    Relationship status: Not on file  . Intimate partner violence    Fear of current or ex partner: Not on file    Emotionally abused: Not on file    Physically abused: Not on file    Forced sexual activity: Not on file  Other Topics Concern  . Not on file  Social History Narrative   ** Merged History Encounter **        History  reviewed. No pertinent family history.  Past Surgical History:  Procedure Laterality Date  . CERVICAL CONIZATION W/BX N/A 06/25/2019   Procedure: CONIZATION CERVIX WITH BIOPSY - COLD KNIFE;  Surgeon: Emily Filbert, MD;  Location: [facility];  Service: Gynecology;  Laterality: N/A;  . ENDOMETRIAL ABLATION N/A 06/25/2019   Procedure: MINERVA ABLATION;  Surgeon: Emily Filbert, MD;  Location: [facility];  Service: Gynecology;  Laterality: N/A;  Minerva rep will here confirmed on 06/20/19 CS  . HYSTEROSCOPY W/D&C  06/25/2019   Procedure: DILATATION AND CURETTAGE /HYSTEROSCOPY;  Surgeon: Emily Filbert, MD;  Location: [facility];  Service: Gynecology;;  . NO PAST SURGERIES      ROS: Review of Systems Negative except as stated above  PHYSICAL EXAM: BP 136/88 (BP Location: Left Arm, Patient Position: Sitting, Cuff Size: Large)   Pulse 96   Temp 98.2 F (36.8 C) (Oral)   Resp 18   Ht 5\' 4"  (1.626 m)   Wt 252 lb (114.3 kg)   SpO2 100%   BMI 43.26 kg/m   Physical Exam  General appearance - alert, well appearing, obese young to middle-aged African-American female and in no distress Mental status - normal mood, behavior, speech, dress, motor activity, and thought processes Neurological -power in both lower extremities 5/5 bilaterally.  Gross sensation intact in both lower extremities.  Straight leg raise negative. Musculoskeletal -mild tenderness on palpation of lower lumbar region right paraspinal muscle   CMP Latest Ref Rng & Units 05/13/2019 04/15/2019 03/26/2018  Glucose 70 - 99 mg/dL 121(H) 94 93  BUN 6 - 20 mg/dL 7 6 11   Creatinine 0.44 - 1.00 mg/dL 0.72 0.75 0.81  Sodium 135 - 145 mmol/L 135 139 137  Potassium 3.5 - 5.1 mmol/L 3.2(L) 3.7 4.1  Chloride 98 - 111 mmol/L 107 109 101  CO2 22 - 32 mmol/L 21(L) 22 27  Calcium 8.9 - 10.3 mg/dL 8.7(L) 9.3 10.0  Total Protein 6.5 - 8.1 g/dL 7.6 - 8.5(H)  Total Bilirubin 0.3 - 1.2 mg/dL 0.6 - 0.4   Alkaline Phos 38 - 126 U/L 46 - 57  AST 15 - 41 U/L 19 - 16  ALT 0 - 44 U/L 21 - 20   Lipid Panel     Component Value Date/Time   CHOL 115 01/30/2018 0355   TRIG 89 01/30/2018 0355   HDL 39 (L) 01/30/2018 0355   CHOLHDL 2.9 01/30/2018 0355   VLDL 18 01/30/2018  0355   LDLCALC 58 01/30/2018 0355    CBC    Component Value Date/Time   WBC 6.1 05/13/2019 1324   RBC 4.28 05/13/2019 1324   HGB 10.4 (L) 05/13/2019 1324   HGB 9.5 (L) 05/02/2019 1113   HCT 34.8 (L) 05/13/2019 1324   HCT 32.0 (L) 05/02/2019 1113   PLT 393 05/13/2019 1324   PLT 352 05/02/2019 1113   MCV 81.3 05/13/2019 1324   MCV 79 05/02/2019 1113   MCV 74 (L) 10/25/2014 1428   MCH 24.3 (L) 05/13/2019 1324   MCHC 29.9 (L) 05/13/2019 1324   RDW Not Measured 05/13/2019 1324   RDW 23.3 (H) 05/02/2019 1113   RDW 18.9 (H) 10/25/2014 1428   LYMPHSABS 2.0 05/13/2019 1324   LYMPHSABS 2.3 02/07/2018 1625   LYMPHSABS 2.6 10/25/2014 1428   MONOABS 0.3 05/13/2019 1324   MONOABS 0.5 10/25/2014 1428   EOSABS 0.1 05/13/2019 1324   EOSABS 0.2 02/07/2018 1625   EOSABS 0.2 10/25/2014 1428   BASOSABS 0.0 05/13/2019 1324   BASOSABS 0.0 02/07/2018 1625   BASOSABS 0.1 10/25/2014 1428    ASSESSMENT AND PLAN:  1. Lumbar radicular pain -Patient reports that this has significantly impacted her quality of life over the past 2 months and is nonresponsive to conservative measures.  We will proceed with imaging.  Further management will be based on results.  In the meantime we will put her on gabapentin.  Advised that the medication can cause drowsiness - gabapentin (NEURONTIN) 300 MG capsule; 1 cap OP QHS x 1 wk then increases to twice a day.  Dispense: 60 capsule; Refill: 3 - MR Lumbar Spine Wo Contrast; Future  2. Influenza vaccination declined   3. Insomnia, unspecified type Good sleep hygiene discussed and encouraged.  Advised that she try getting in bed much later at night rather than at 8:30 p.m. also advised that she turn  off her phone outputted on vibrate when she gets in bed.  If she is not able to fall asleep within 45 minutes she should get up and do something or read until her eyes feel heavy.    Patient was given the opportunity to ask questions.  Patient verbalized understanding of the plan and was able to repeat key elements of the plan.   Orders Placed This Encounter  Procedures  . MR Lumbar Spine Wo Contrast     Requested Prescriptions   Signed Prescriptions Disp Refills  . gabapentin (NEURONTIN) 300 MG capsule 60 capsule 3    Sig: 1 cap OP QHS x 1 wk then increases to twice a day.    Return if symptoms worsen or fail to improve.  Karle Plumber, MD, FACP

## 2019-07-07 NOTE — Patient Instructions (Signed)

## 2019-07-14 ENCOUNTER — Encounter: Payer: Self-pay | Admitting: Internal Medicine

## 2019-07-21 ENCOUNTER — Encounter (HOSPITAL_COMMUNITY): Payer: Self-pay | Admitting: Radiology

## 2019-07-21 ENCOUNTER — Other Ambulatory Visit: Payer: Self-pay

## 2019-07-21 ENCOUNTER — Ambulatory Visit (HOSPITAL_COMMUNITY)
Admission: RE | Admit: 2019-07-21 | Discharge: 2019-07-21 | Disposition: A | Discharge status: Home / Self Care | Payer: Medicaid Other | Source: Ambulatory Visit | Attending: Internal Medicine | Admitting: Internal Medicine

## 2019-07-21 DIAGNOSIS — M5416 Radiculopathy, lumbar region: Secondary | ICD-10-CM

## 2019-07-23 ENCOUNTER — Other Ambulatory Visit: Payer: Self-pay | Admitting: Internal Medicine

## 2019-07-23 DIAGNOSIS — R200 Anesthesia of skin: Secondary | ICD-10-CM

## 2019-07-23 DIAGNOSIS — M79604 Pain in right leg: Secondary | ICD-10-CM

## 2019-07-24 ENCOUNTER — Encounter: Payer: Self-pay | Admitting: Internal Medicine

## 2019-07-24 DIAGNOSIS — M7071 Other bursitis of hip, right hip: Secondary | ICD-10-CM

## 2019-07-28 ENCOUNTER — Telehealth: Payer: Self-pay | Admitting: Internal Medicine

## 2019-07-28 ENCOUNTER — Telehealth: Payer: Self-pay | Admitting: *Deleted

## 2019-07-28 MED ORDER — DULOXETINE HCL 20 MG PO CPEP: 20.0000 mg | ORAL_CAPSULE | Freq: Every day | ORAL | 1 refills | Status: DC

## 2019-07-28 MED FILL — DULoxetine HCL 20 MG CPEP: 20 | 30 days supply | Qty: 30 | Fill #0

## 2019-07-28 NOTE — Telephone Encounter (Signed)
Pt called to request an alternative for  -gabapentin (NEURONTIN) 300 MG capsule  States she continues having pain that shoots through her leg. She is aware of her Neuro appt on 07/30/2019, however she cannot work with this pain until then. pt is also on ibuprofen 800MG  but this does not help at all. Please follow up.  New pharmacy-[facility] [address] [place], [place], [place] [address]

## 2019-07-28 NOTE — Telephone Encounter (Signed)
Pre previous message, patient would like Percocet and is okay with having Cymbalta. Informed patient message will be sent to provider.

## 2019-07-30 ENCOUNTER — Telehealth (INDEPENDENT_AMBULATORY_CARE_PROVIDER_SITE_OTHER): Payer: Medicaid Other | Admitting: Neurology

## 2019-07-30 ENCOUNTER — Encounter: Payer: Self-pay | Admitting: Neurology

## 2019-07-30 ENCOUNTER — Other Ambulatory Visit: Payer: Self-pay

## 2019-07-30 ENCOUNTER — Ambulatory Visit (HOSPITAL_COMMUNITY): Admission: RE | Admit: 2019-07-30 | Payer: Medicaid Other | Source: Ambulatory Visit

## 2019-07-30 ENCOUNTER — Telehealth: Payer: Self-pay | Admitting: Neurology

## 2019-07-30 VITALS — Ht 64.0 in | Wt 252.0 lb

## 2019-07-30 DIAGNOSIS — R202 Paresthesia of skin: Secondary | ICD-10-CM

## 2019-07-30 DIAGNOSIS — R200 Anesthesia of skin: Secondary | ICD-10-CM

## 2019-07-30 NOTE — Telephone Encounter (Signed)
Attempted to reach patient for scheduling with Dr. Posey Pronto but her voice mail box is full.  Please contact patient to schedule in-office follow-up visit (next available, not urgent).

## 2019-07-30 NOTE — Progress Notes (Signed)
New Patient Virtual Visit via Video Note The purpose of this virtual visit is to provide medical care while limiting exposure to the novel coronavirus.    Consent was obtained for video visit:  Yes.   Answered questions that patient had about telehealth interaction:  Yes.   I discussed the limitations, risks, security and privacy concerns of performing an evaluation and management service by telemedicine. I also discussed with the patient that there may be a patient responsible charge related to this service. The patient expressed understanding and agreed to proceed.  Pt location: Home Physician Location: office Name of referring provider:  Ladell Pier, MD I connected with [name] at patients initiation/request on 07/30/2019 at  8:10 AM EDT by video enabled telemedicine application and verified that I am speaking with the correct person using two identifiers. Pt MRN:  [id number] Pt DOB:  [birth year] Video Participants:  [name]    History of Present Illness: Nancy Dickerson is a 38 y.o. female presenting for evaluation of right leg numbness.  Over the past 3 months, she has noticed numbness/tingling/throbbing pain from the right side of her low back radiating into her thigh towards her knee.  It involves the medial and lateral aspect of the thigh.  She was started on Cymbalta 20mg  and has not noticed any improvement.  She denies any similar symptoms over the left leg. She complains of some weakness and walks with a limp.  There was no precipitating injury or trauma.    Out-side paper records, electronic medical record, and images have been reviewed where available and summarized as:  Lab Results  Component Value Date   HGBA1C 5.3 07/08/2018   Lab Results  Component Value Date   VITAMINB12 293 03/26/2018   Lab Results  Component Value Date   TSH 0.768 07/08/2018    Past Medical History:  Diagnosis Date  . Anemia   . Asthma   . Bursitis of hip   .  Cardiomegaly   . Cardiomegaly   . Chondromalacia of hip   . Chondromalacia of right knee   . COPD (chronic obstructive pulmonary disease) ([place])   . GERD (gastroesophageal reflux disease)   . H/O transfusion of packed red blood cells   . Knee pain   . Tinnitus     Past Surgical History:  Procedure Laterality Date  . CERVICAL CONIZATION W/BX N/A 06/25/2019   Procedure: CONIZATION CERVIX WITH BIOPSY - COLD KNIFE;  Surgeon: Emily Filbert, MD;  Location: [facility];  Service: Gynecology;  Laterality: N/A;  . ENDOMETRIAL ABLATION N/A 06/25/2019   Procedure: MINERVA ABLATION;  Surgeon: Emily Filbert, MD;  Location: [facility];  Service: Gynecology;  Laterality: N/A;  Minerva rep will here confirmed on 06/20/19 CS  . HYSTEROSCOPY W/D&C  06/25/2019   Procedure: DILATATION AND CURETTAGE /HYSTEROSCOPY;  Surgeon: Emily Filbert, MD;  Location: [facility];  Service: Gynecology;;  . NO PAST SURGERIES       Medications:  Outpatient Encounter Medications as of 07/30/2019  Medication Sig  . DULoxetine (CYMBALTA) 20 MG capsule Take 1 capsule (20 mg total) by mouth at bedtime.  . fluticasone (FLOVENT HFA) 110 MCG/ACT inhaler Inhale into the lungs 2 (two) times daily.  Marland Kitchen ibuprofen (ADVIL) 800 MG tablet Take 1 tablet (800 mg total) by mouth every 8 (eight) hours as needed.  . Multiple Vitamin (MULTIVITAMIN) capsule Take 1 capsule by mouth daily.  . naproxen (NAPROSYN) 500  MG tablet Take 1 tablet (500 mg total) by mouth 2 (two) times daily.  . NON FORMULARY as needed.  Marland Kitchen oxyCODONE-acetaminophen (PERCOCET/ROXICET) 5-325 MG tablet Take 1 tablet by mouth every 6 (six) hours as needed. (Patient not taking: Reported on 07/30/2019)  . [DISCONTINUED] gabapentin (NEURONTIN) 300 MG capsule 1 cap OP QHS x 1 wk then increases to twice a day.   No facility-administered encounter medications on file as of 07/30/2019.     Allergies: No Known Allergies  Family History:  History reviewed. No pertinent family history.  Social History: Social History   Tobacco Use  . Smoking status: Never Smoker  . Smokeless tobacco: Never Used  Substance Use Topics  . Alcohol use: Yes    Comment: rare  . Drug use: Yes    Types: Marijuana   Social History   Social History Narrative   ** Merged History Encounter **         Lives with someone   Right handed    Little caffeine - drinks Sprite   Education - some college           Review of Systems:  CONSTITUTIONAL: No fevers, chills, night sweats, or weight loss.   EYES: No visual changes or eye pain ENT: No hearing changes.  No history of nose bleeds.   RESPIRATORY: No cough, wheezing and shortness of breath.   CARDIOVASCULAR: Negative for chest pain, and palpitations.   GI: Negative for abdominal discomfort, blood in stools or black stools.  No recent change in bowel habits.   GU:  No history of incontinence.   MUSCLOSKELETAL: No history of joint pain or swelling.  No myalgias.   SKIN: Negative for lesions, rash, and itching.   HEMATOLOGY/ONCOLOGY: Negative for prolonged bleeding, bruising easily, and swollen nodes.  No history of cancer.   ENDOCRINE: Negative for cold or heat intolerance, polydipsia or goiter.   PSYCH:  No depression or anxiety symptoms.   NEURO: As Above.   Vital Signs:  Ht 5\' 4"  (1.626 m)   Wt 252 lb (114.3 kg)   BMI 43.26 kg/m    General Medical Exam:  Lethargic appearing, laying in bed.  Nonlabored breathing.  No deformity or edema.  No rash.  Neurological Exam: MENTAL STATUS including orientation to time, place, person, recent and remote memory, attention span and concentration, language, and fund of knowledge is fairly intact.  When asked to place the phone in a position that allows better exam, she seemed confused and requested in-person exam.  Speech is not dysarthric.  CRANIAL NERVES:  Normal conjugate, extra-ocular eye movements in all directions of gaze.  No ptosis.   Normal facial symmetry and movements.  Normal shoulder shrug and head rotation.    MOTOR:  Antigravity in the arms.  Examination of the legs was limited because she was unable to place the phone in a manner that would allow leg examination.  Despite trying to offer various suggestions to aide the exam, she elected to make an in-office exam.   IMPRESSION: Right lumbar radiculopathy at L2-3 vs meralgia paresthetica She will return to the office for formal neuromuscular exam.    Follow Up Instructions:  I discussed the assessment and treatment plan with the patient. The patient was provided an opportunity to ask questions and all were answered. The patient agreed with the plan and demonstrated an understanding of the instructions.   The patient was advised to call back or seek an in-person evaluation if the symptoms worsen or  if the condition fails to improve as anticipated.  Total Time spent:  20 min   Alda Berthold, DO

## 2019-07-31 MED ORDER — DULOXETINE HCL 20 MG PO CPEP: 20.0000 mg | ORAL_CAPSULE | Freq: Every day | ORAL | 1 refills | Status: DC

## 2019-07-31 NOTE — Telephone Encounter (Signed)
Visit yesterday was very limited and incomplete and therefore without understanding the nature of her symptoms, I will not prescribe any medications.  Patient is scheduled to be evaluated in the office on 11/2 at which time we can discuss management options.

## 2019-07-31 NOTE — Telephone Encounter (Signed)
Spoke with patient. She had virtual visit with MD yesterday and has another f/u  11/2. She said she wants to know if she can have something else for the pain in addition to cymbalta but that won't make her sleepy. 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 Low back down to knee/calf only on right leg. States her hands shake also.  Informed her will make Dr. Posey Pronto aware and call her back. Verbalizes understanding.

## 2019-07-31 NOTE — Telephone Encounter (Signed)
Patient scheduled for 08/11/19 with Dr. Posey Pronto at 10:50 AM.   Before then, she'd like to know if there is anything else beside Cymbalta that can help her that doesn't have a side effect of sleepiness.  [facility] in [place]

## 2019-08-01 NOTE — Telephone Encounter (Signed)
Called patient and informed below MD information that she will be assessed further at office visit in 1.5 weeks. Patient verbalized understanding.

## 2019-08-04 ENCOUNTER — Ambulatory Visit (HOSPITAL_COMMUNITY): Payer: Medicaid Other | Attending: Internal Medicine

## 2019-08-04 MED FILL — GABAPENTIN 300 MG CAPSULE: 300 | 30 days supply | Qty: 60 | Fill #1

## 2019-08-11 ENCOUNTER — Ambulatory Visit: Payer: Medicaid Other | Admitting: Neurology

## 2019-08-11 ENCOUNTER — Encounter: Payer: Self-pay | Admitting: Neurology

## 2019-08-11 ENCOUNTER — Other Ambulatory Visit: Payer: Self-pay

## 2019-08-11 VITALS — BP 142/88 | HR 78 | Ht 64.0 in | Wt 249.0 lb

## 2019-08-11 DIAGNOSIS — R202 Paresthesia of skin: Secondary | ICD-10-CM | POA: Diagnosis not present

## 2019-08-11 NOTE — Patient Instructions (Addendum)
Nerve of the right leg.  Do not apply any lotion or oil on the leg.  We will send a referral to physical therapy     Avoid any fitting clothing around your waist  Recommend starting Cymbalta as prescribed by Dr. Jinny Sanders AND NERVE CONDUCTION STUDIES (EMG/NCS) INSTRUCTIONS  How to Prepare The neurologist conducting the EMG will need to know if you have certain medical conditions. Tell the neurologist and other EMG lab personnel if you: . Have a pacemaker or any other electrical medical device . Take blood-thinning medications . Have hemophilia, a blood-clotting disorder that causes prolonged bleeding Bathing Take a shower or bath shortly before your exam in order to remove oils from your skin. Don't apply lotions or creams before the exam.  What to Expect You'll likely be asked to change into a hospital gown for the procedure and lie down on an examination table. The following explanations can help you understand what will happen during the exam.  . Electrodes. The neurologist or a technician places surface electrodes at various locations on your skin depending on where you're experiencing symptoms. Or the neurologist may insert needle electrodes at different sites depending on your symptoms.  . Sensations. The electrodes will at times transmit a tiny electrical current that you may feel as a twinge or spasm. The needle electrode may cause discomfort or pain that usually ends shortly after the needle is removed. If you are concerned about discomfort or pain, you may want to talk to the neurologist about taking a short break during the exam.  . Instructions. During the needle EMG, the neurologist will assess whether there is any spontaneous electrical activity when the muscle is at rest - activity that isn't present in healthy muscle tissue - and the degree of activity when you slightly contract the muscle.  He or she will give you instructions on resting and contracting a  muscle at appropriate times. Depending on what muscles and nerves the neurologist is examining, he or she may ask you to change positions during the exam.  After your EMG You may experience some temporary, minor bruising where the needle electrode was inserted into your muscle. This bruising should fade within several days. If it persists, contact your primary care doctor.

## 2019-08-11 NOTE — Progress Notes (Signed)
[facility] Clinic Note - Initial Visit   Date: 08/11/19  Nancy Dickerson MRN: [id number] DOB: Mar 28, 1981   Dear Dr. Wynetta Emery:  Thank you for your kind referral of Nancy Dickerson for consultation of right leg pain. Although her history is well known to you, please allow [place] to reiterate it for the purpose of our medical record. The patient was accompanied to the clinic by self.  History of Present Illness: Nancy Dickerson is a 38 y.o. right-handed female presenting for evaluation of right leg pain.  Starting around July 2020, she began having pain over the right thigh/hip, described as sharp and stingling over the lateral thigh. Pain usually lasts anywhere from 15-45 minutes and is worse with walking. Over the past week, she noticed the sharp pain extending into her lower anterior leg. It does not involve the right foot.  She reports sensation of heaviness in the right leg.  She has difficulty with walking and climbing stairs because within 5-10 minutes her leg starts to ache and she needs to rest.  She has not suffered any falls. She denies any low back pain or similar symptoms on the left leg.  She has tried gabapentin 300mg  BID (cognitive side effects).  She was prescribed Cymbalta which she did not pick up, because she does not want medications which are antidepressants.  MRI lumbar spine from 07/22/2019 did not show nerve impingement.  She endorses wearing very tight underwear and is trying to wear boyshorts because she feels like it pinches her skin at the waist.   She works as Retail buyer at [employer].    Out-side paper records, electronic medical record, and images have been reviewed where available and summarized as:  MRI lumbar spine wo contrast 07/22/2019: 1. No significant disc herniation, spinal canal stenosis or neural foraminal narrowing. 2. Diffuse T1 hypointense marrow signal. This finding is nonspecific, but may be seen in the setting of chronic anemia.   Lab Results  Component Value Date   HGBA1C 5.3 07/08/2018   Lab Results  Component Value Date   VITAMINB12 293 03/26/2018   Lab Results  Component Value Date   TSH 0.768 07/08/2018     Past Medical History:  Diagnosis Date  . Anemia   . Asthma   . Bursitis of hip   . Cardiomegaly   . Cardiomegaly   . Chondromalacia of hip   . Chondromalacia of right knee   . COPD (chronic obstructive pulmonary disease) ([place])   . GERD (gastroesophageal reflux disease)   . H/O transfusion of packed red blood cells   . Knee pain   . Tinnitus     Past Surgical History:  Procedure Laterality Date  . CERVICAL CONIZATION W/BX N/A 06/25/2019   Procedure: CONIZATION CERVIX WITH BIOPSY - COLD KNIFE;  Surgeon: Emily Filbert, MD;  Location: [facility];  Service: Gynecology;  Laterality: N/A;  . ENDOMETRIAL ABLATION N/A 06/25/2019   Procedure: MINERVA ABLATION;  Surgeon: Emily Filbert, MD;  Location: [facility];  Service: Gynecology;  Laterality: N/A;  Min[employer]a rep will here confirmed on 06/20/19 CS  . HYSTEROSCOPY W/D&C  06/25/2019   Procedure: DILATATION AND CURETTAGE /HYSTEROSCOPY;  Surgeon: Emily Filbert, MD;  Location: [facility];  Service: Gynecology;;  . NO PAST SURGERIES       Medications:  Outpatient Encounter Medications as of 08/11/2019  Medication Sig  . DULoxetine (CYMBALTA) 20 MG capsule Take 1 capsule (20 mg total) by  mouth daily.  . fluticasone (FLOVENT HFA) 110 MCG/ACT inhaler Inhale into the lungs 2 (two) times daily.  Marland Kitchen ibuprofen (ADVIL) 800 MG tablet Take 1 tablet (800 mg total) by mouth every 8 (eight) hours as needed.  . Multiple Vitamin (MULTIVITAMIN) capsule Take 1 capsule by mouth daily.  . naproxen (NAPROSYN) 500 MG tablet Take 1 tablet (500 mg total) by mouth 2 (two) times daily.  . NON FORMULARY as needed.  Marland Kitchen oxyCODONE-acetaminophen (PERCOCET/ROXICET) 5-325 MG tablet Take 1 tablet by mouth every 6 (six) hours as needed.   No  facility-administered encounter medications on file as of 08/11/2019.     Allergies: No Known Allergies  Family History: History reviewed. No pertinent family history.  Social History: Social History   Tobacco Use  . Smoking status: Never Smoker  . Smokeless tobacco: Never Used  Substance Use Topics  . Alcohol use: Yes    Comment: rare  . Drug use: Yes    Types: Marijuana   Social History   Social History Narrative   ** Merged History Encounter **         Lives with someone   Right handed    Little caffeine - drinks Sprite   Education - some college           Review of Systems:  CONSTITUTIONAL: No fevers, chills, night sweats, or weight loss.   EYES: No visual changes or eye pain ENT: No hearing changes.  No history of nose bleeds.   RESPIRATORY: No cough, wheezing and shortness of breath.   CARDIOVASCULAR: Negative for chest pain, and palpitations.   GI: Negative for abdominal discomfort, blood in stools or black stools.  No recent change in bowel habits.   GU:  No history of incontinence.   MUSCLOSKELETAL: No history of joint pain or swelling.  No myalgias.   SKIN: Negative for lesions, rash, and itching.   HEMATOLOGY/ONCOLOGY: Negative for prolonged bleeding, bruising easily, and swollen nodes.  No history of cancer.   ENDOCRINE: Negative for cold or heat intolerance, polydipsia or goiter.   PSYCH:  No depression or anxiety symptoms.   NEURO: As Above.   Vital Signs:  BP (!) 142/88   Pulse 78   Ht 5\' 4"  (1.626 m)   Wt 249 lb (112.9 kg)   SpO2 98%   BMI 42.74 kg/m    General Medical Exam:   General:  Well appearing, comfortable.   Eyes/ENT: see cranial nerve examination.   Neck:   No carotid bruits. Respiratory:  Clear to auscultation, good air entry bilaterally.   Cardiac:  Regular rate and rhythm, no murmur.   Extremities:  No deformities, edema, or skin discoloration.  Skin:  No rashes or lesions.  Neurological Exam: MENTAL STATUS including  orientation to time, place, person, recent and remote memory, attention span and concentration, language, and fund of knowledge is normal.  Speech is not dysarthric.  CRANIAL NERVES: II:  No visual field defects.  III-IV-VI: Pupils equal round and reactive to light.  Normal conjugate, extra-ocular eye movements in all directions of gaze.  No nystagmus.  No ptosis.   V:  Normal facial sensation.    VII:  Normal facial symmetry and movements.   VIII:  Normal hearing and vestibular function.   IX-X:  Normal palatal movement.   XI:  Normal shoulder shrug and head rotation.   XII:  Normal tongue strength and range of motion, no deviation or fasciculation.  MOTOR:  No atrophy, fasciculations or abnormal movements.  No pronator drift.   Upper Extremity:  Right  Left  Deltoid  5/5   5/5   Biceps  5/5   5/5   Triceps  5/5   5/5   Infraspinatus 5/5  5/5  Medial pectoralis 5/5  5/5  Wrist extensors  5/5   5/5   Wrist flexors  5/5   5/5   Finger extensors  5/5   5/5   Finger flexors  5/5   5/5   Dorsal interossei  5/5   5/5   Abductor pollicis  5/5   5/5   Tone (Ashworth scale)  0  0   Lower Extremity:  Right  Left  Hip flexors  5/5   5/5   Hip extensors  5/5   5/5   Adductor 5/5  5/5  Abductor 5/5  5/5  Knee flexors  5/5   5/5   Knee extensors  5/5   5/5   Dorsiflexors  5/5   5/5   Plantarflexors  5/5   5/5   Toe extensors  5/5   5/5   Toe flexors  5/5   5/5   Tone (Ashworth scale)  0  0   MSRs:  Right        Left                  brachioradialis 2+  2+  biceps 2+  2+  triceps 2+  2+  patellar 2+  2+  ankle jerk 2+  2+  Hoffman no  no  plantar response down  down   SENSORY:  Patchy area of reduced temperature over the lateral proximal lower leg; otherwise vibration and pin prick is intact throughout.  Romberg's sign absent.   COORDINATION/GAIT: Normal finger-to- nose-finger.  Intact rapid alternating movements bilaterally.  Able to rise from a chair without using arms.  Gait  is mildly wide-based due to body habitus, stable and unassisted.  Tandem and stressed gait intact.    IMPRESSION: Right leg pain, unclear etiology.  Exam is does not disclose any weakness, abnormal reflexes, or sensory change which would help understand the nature of her symptoms.  She had normal MRI lumbar spine.  Meralgia paresthetica was initially considered, but this would not involve the lower leg nor manifesting with weakness.  Nevertheless, I did counsel patient to avoid wearing tight constrictive clothing which could potentially cause nerve impingement at the waist. She will return to the office for electrodiagnostic testing of the right leg to help localize symptoms.Marland Kitchen   PLAN/RECOMMENDATIONS:  NCS/EMG of the right leg PT referral for right leg stretching Recommend starting Cymbalta 20mg  as prescribed by PCP  Further recommendations pending results.  Greater than 50% of this 30 minute visit was spent in counseling, explanation of diagnosis, planning of further management, and coordination of care.   Thank you for allowing me to participate in patient's care.  If I can answer any additional questions, I would be pleased to do so.    Sincerely,    Tyriek Hofman K. Posey Pronto, DO

## 2019-08-13 ENCOUNTER — Encounter: Payer: Self-pay | Admitting: Orthopaedic Surgery

## 2019-08-13 ENCOUNTER — Ambulatory Visit (INDEPENDENT_AMBULATORY_CARE_PROVIDER_SITE_OTHER): Payer: Medicaid Other | Admitting: Orthopaedic Surgery

## 2019-08-13 ENCOUNTER — Other Ambulatory Visit: Payer: Self-pay

## 2019-08-13 DIAGNOSIS — M25551 Pain in right hip: Secondary | ICD-10-CM | POA: Diagnosis not present

## 2019-08-13 DIAGNOSIS — M7061 Trochanteric bursitis, right hip: Secondary | ICD-10-CM | POA: Diagnosis not present

## 2019-08-13 DIAGNOSIS — M25559 Pain in unspecified hip: Secondary | ICD-10-CM | POA: Insufficient documentation

## 2019-08-13 DIAGNOSIS — M2241 Chondromalacia patellae, right knee: Secondary | ICD-10-CM | POA: Diagnosis not present

## 2019-08-13 DIAGNOSIS — M25562 Pain in left knee: Secondary | ICD-10-CM | POA: Insufficient documentation

## 2019-08-13 MED ORDER — LIDOCAINE HCL 1 % IJ SOLN
3.0000 mL | INTRAMUSCULAR | Status: AC | PRN
  Administered 2019-08-13: 3 mL

## 2019-08-13 MED ORDER — METHYLPREDNISOLONE ACETATE 40 MG/ML IJ SUSP
40.0000 mg | INTRAMUSCULAR | Status: AC | PRN
  Administered 2019-08-13: 40 mg via INTRA_ARTICULAR

## 2019-08-13 NOTE — Progress Notes (Signed)
Office Visit Note   Patient: Nancy Dickerson           Date of Birth: March 23, 1981           MRN: [id number] Visit Date: 08/13/2019              Requested by: Ladell Pier, MD [address] [place],  [place] [address] PCP: Ladell Pier, MD   Assessment & Plan: Visit Diagnoses:  1. Pain in right hip   2. Trochanteric bursitis, right hip   3. Chondromalacia patellae, right knee     Plan: Per her wishes I did provide a steroid injection over her right hip trochanteric area.  Of also recommended a topical anti-inflammatory such as Voltaren gel to try.  I again expressed the importance of losing weight.  She is interested in trying outpatient physical therapy and I do feel this would be worthwhile for any modalities that can help strengthen her quad muscles as well as treat pain at the trochanteric area on the right side.  We will work on setting up outpatient physical therapy hopefully here at our practice.  She is agreeable to this.  I will see her back in about 6 weeks to see how she is doing overall.  All question concerns were answered and addressed.  Follow-Up Instructions: Return in about 6 weeks (around 09/24/2019).   Orders:  Orders Placed This Encounter  Procedures  . Large Joint Inj   No orders of the defined types were placed in this encounter.     Procedures: Large Joint Inj: R greater trochanter on 08/13/2019 10:21 AM Indications: pain and diagnostic evaluation Details: 22 G 1.5 in needle, lateral approach  Arthrogram: No  Medications: 3 mL lidocaine 1 %; 40 mg methylPREDNISolone acetate 40 MG/ML Outcome: tolerated well, no immediate complications Procedure, treatment alternatives, risks and benefits explained, specific risks discussed. Consent was given by the patient. Immediately prior to procedure a time out was called to verify the correct patient, procedure, equipment, support staff and site/side marked as required. Patient was prepped and draped in  the usual sterile fashion.       Clinical Data: No additional findings.   Subjective: Chief Complaint  Patient presents with  . Right Hip - Pain  The patient's mom well-known to [place].  She is only 74 year old and has been dealing with right hip pain for some time now.  She also has bilateral knee pain with the right worse than left with grinding of the right knee.  She has had trochanteric bursitis of her right hip.  We have x-rayed her right hip before which was normal.  She is still pointing to the lateral aspect of her hip as a source of her pain on the right side.  She denies any groin pain.  She has been unable to lose weight.  Her BMI is almost 43.  HPI  Review of Systems She currently denies any headache, chest pain, shortness of breath, fever, chills, nausea, vomiting  Objective: Vital Signs: There were no vitals taken for this visit.  Physical Exam She is alert and orient x3 and in no acute distress Ortho Exam Examination of both hips show the move fluidly smooth.  She has pain to palpation of the trochanteric area on the right side and IT band.  Both knees hyperextend and has significant patellofemoral grinding crepitation. Specialty Comments:  No specialty comments available.  Imaging: No results found.   PMFS History: Patient Active Problem List  Diagnosis Date Noted  . Hip pain 08/13/2019  . Insomnia 07/07/2019  . Lumbar radicular pain 07/07/2019  . Uterine leiomyoma 03/12/2018  . Trochanteric bursitis of right hip 03/12/2018  . Class 3 severe obesity due to excess calories without serious comorbidity with body mass index (BMI) of 40.0 to 44.9 in adult ([place]) 03/12/2018  . Iron deficiency anemia due to chronic blood loss 01/29/2018  . Hypokalemia 01/29/2018  . Right sided weakness 01/29/2018   Past Medical History:  Diagnosis Date  . Anemia   . Asthma   . Bursitis of hip   . Cardiomegaly   . Cardiomegaly   . Chondromalacia of hip   . Chondromalacia  of right knee   . COPD (chronic obstructive pulmonary disease) ([place])   . GERD (gastroesophageal reflux disease)   . H/O transfusion of packed red blood cells   . Knee pain   . Tinnitus     History reviewed. No pertinent family history.  Past Surgical History:  Procedure Laterality Date  . CERVICAL CONIZATION W/BX N/A 06/25/2019   Procedure: CONIZATION CERVIX WITH BIOPSY - COLD KNIFE;  Surgeon: Emily Filbert, MD;  Location: [facility];  Service: Gynecology;  Laterality: N/A;  . ENDOMETRIAL ABLATION N/A 06/25/2019   Procedure: MINERVA ABLATION;  Surgeon: Emily Filbert, MD;  Location: [facility];  Service: Gynecology;  Laterality: N/A;  Min[employer] rep will here confirmed on 06/20/19 CS  . HYSTEROSCOPY W/D&C  06/25/2019   Procedure: DILATATION AND CURETTAGE /HYSTEROSCOPY;  Surgeon: Emily Filbert, MD;  Location: [facility];  Service: Gynecology;;  . NO PAST SURGERIES     Social History   Occupational History  . 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 Smoking status: Never Smoker  . Smokeless tobacco: Never Used  Substance and Sexual Activity  . Alcohol use: Yes    Comment: rare  . Drug use: Yes    Types: Marijuana  . Sexual activity: Not on file

## 2019-08-18 ENCOUNTER — Emergency Department
Admission: EM | Admit: 2019-08-18 | Discharge: 2019-08-18 | Disposition: A | Discharge status: Home / Self Care | Payer: Medicaid Other | Attending: Emergency Medicine | Admitting: Emergency Medicine

## 2019-08-18 ENCOUNTER — Other Ambulatory Visit: Payer: Self-pay

## 2019-08-18 ENCOUNTER — Emergency Department: Payer: Medicaid Other

## 2019-08-18 DIAGNOSIS — Y9389 Activity, other specified: Secondary | ICD-10-CM | POA: Insufficient documentation

## 2019-08-18 DIAGNOSIS — S8991XA Unspecified injury of right lower leg, initial encounter: Secondary | ICD-10-CM | POA: Diagnosis present

## 2019-08-18 DIAGNOSIS — J45909 Unspecified asthma, uncomplicated: Secondary | ICD-10-CM | POA: Insufficient documentation

## 2019-08-18 DIAGNOSIS — S86311A Strain of muscle(s) and tendon(s) of peroneal muscle group at lower leg level, right leg, initial encounter: Secondary | ICD-10-CM | POA: Diagnosis not present

## 2019-08-18 DIAGNOSIS — Z79899 Other long term (current) drug therapy: Secondary | ICD-10-CM | POA: Diagnosis not present

## 2019-08-18 DIAGNOSIS — X509XXA Other and unspecified overexertion or strenuous movements or postures, initial encounter: Secondary | ICD-10-CM | POA: Insufficient documentation

## 2019-08-18 DIAGNOSIS — Y999 Unspecified external cause status: Secondary | ICD-10-CM | POA: Diagnosis not present

## 2019-08-18 DIAGNOSIS — S86811A Strain of other muscle(s) and tendon(s) at lower leg level, right leg, initial encounter: Secondary | ICD-10-CM

## 2019-08-18 DIAGNOSIS — M79661 Pain in right lower leg: Secondary | ICD-10-CM | POA: Insufficient documentation

## 2019-08-18 DIAGNOSIS — Y929 Unspecified place or not applicable: Secondary | ICD-10-CM | POA: Insufficient documentation

## 2019-08-18 DIAGNOSIS — R600 Localized edema: Secondary | ICD-10-CM | POA: Diagnosis not present

## 2019-08-18 DIAGNOSIS — J449 Chronic obstructive pulmonary disease, unspecified: Secondary | ICD-10-CM | POA: Insufficient documentation

## 2019-08-18 DIAGNOSIS — S86911A Strain of unspecified muscle(s) and tendon(s) at lower leg level, right leg, initial encounter: Secondary | ICD-10-CM | POA: Diagnosis not present

## 2019-08-18 MED ORDER — NAPROXEN 500 MG PO TABS
500.0000 mg | ORAL_TABLET | Freq: Once | ORAL | Status: AC
  Administered 2019-08-18: 11:00:00 500 mg via ORAL
  Filled 2019-08-18: qty 1

## 2019-08-18 MED ORDER — NAPROXEN 500 MG PO TABS: 500.0000 mg | ORAL_TABLET | Freq: Two times a day (BID) | ORAL | 2 refills | Status: DC

## 2019-08-18 NOTE — ED Notes (Signed)
Had procedure with anesthesia 9/16 for cone bx.

## 2019-08-18 NOTE — ED Notes (Signed)
Noticed pain and swelling right calf.  No discoloration or heat in it.

## 2019-08-18 NOTE — ED Notes (Signed)
Crutches fitted.

## 2019-08-18 NOTE — ED Triage Notes (Signed)
Pt c/o right calf pain with swelling since last night. Denies injury.

## 2019-08-18 NOTE — ED Provider Notes (Signed)
[facility] Emergency Department Provider Note   ____________________________________________    I have reviewed the triage vital signs and the nursing notes.   HISTORY  Chief Complaint Leg Pain     HPI Nancy Dickerson is a 38 y.o. female who presents with complaints of right leg pain.  Patient reports last night before she went to bed she developed "a charley horse "in her right calf, she tried to aggressively stretch it out and developed some pain in the area.  She was tired so she decided to sleep.  This morning she reports it continues to hurt and is painful to walk on.  No redness, some mild swelling.  No discoloration.  Past Medical History:  Diagnosis Date  . Anemia   . Asthma   . Bursitis of hip   . Cardiomegaly   . Cardiomegaly   . Chondromalacia of hip   . Chondromalacia of right knee   . COPD (chronic obstructive pulmonary disease) ([place])   . GERD (gastroesophageal reflux disease)   . H/O transfusion of packed red blood cells   . Knee pain   . Tinnitus     Patient Active Problem List   Diagnosis Date Noted  . Hip pain 08/13/2019  . Insomnia 07/07/2019  . Lumbar radicular pain 07/07/2019  . Uterine leiomyoma 03/12/2018  . Trochanteric bursitis of right hip 03/12/2018  . Class 3 severe obesity due to excess calories without serious comorbidity with body mass index (BMI) of 40.0 to 44.9 in adult ([place]) 03/12/2018  . Iron deficiency anemia due to chronic blood loss 01/29/2018  . Hypokalemia 01/29/2018  . Right sided weakness 01/29/2018    Past Surgical History:  Procedure Laterality Date  . CERVICAL CONIZATION W/BX N/A 06/25/2019   Procedure: CONIZATION CERVIX WITH BIOPSY - COLD KNIFE;  Surgeon: Emily Filbert, MD;  Location: [facility];  Service: Gynecology;  Laterality: N/A;  . ENDOMETRIAL ABLATION N/A 06/25/2019   Procedure: MINERVA ABLATION;  Surgeon: Emily Filbert, MD;  Location: [facility];  Service:  Gynecology;  Laterality: N/A;  [employer] rep will here confirmed on 06/20/19 CS  . HYSTEROSCOPY W/D&C  06/25/2019   Procedure: DILATATION AND CURETTAGE /HYSTEROSCOPY;  Surgeon: Emily Filbert, MD;  Location: [facility];  Service: Gynecology;;  . NO PAST SURGERIES      Prior to Admission medications   Medication Sig Start Date End Date Taking? Authorizing Provider  DULoxetine (CYMBALTA) 20 MG capsule Take 1 capsule (20 mg total) by mouth daily. 07/31/19  Yes Ladell Pier, MD  Multiple Vitamin (MULTIVITAMIN) capsule Take 1 capsule by mouth daily.   Yes Joline Salt, RN  fluticasone (FLOVENT HFA) 110 MCG/ACT inhaler Inhale into the lungs 2 (two) times daily.    Joline Salt, RN  naproxen (NAPROSYN) 500 MG tablet Take 1 tablet (500 mg total) by mouth 2 (two) times daily with a meal. 08/18/19   Lavonia Drafts, MD  NON FORMULARY as needed.    Joline Salt, RN     Allergies Patient has no known allergies.  No family history on file.  Social History Social History   Tobacco Use  . Smoking status: Never Smoker  . Smokeless tobacco: Never Used  Substance Use Topics  . Alcohol use: Yes    Comment: rare  . Drug use: Yes    Types: Marijuana    Review of Systems  Constitutional: No fever/chills    Musculoskeletal: As  above Skin: Negative for rash. Neurological: Negative for weakness   ____________________________________________   PHYSICAL EXAM:  VITAL SIGNS: ED Triage Vitals  Enc Vitals Group     BP 08/18/19 0808 125/83     Pulse Rate 08/18/19 0808 83     Resp 08/18/19 0808 17     Temp 08/18/19 0808 98.4 F (36.9 C)     Temp Source 08/18/19 0808 Oral     SpO2 08/18/19 0808 98 %     Weight 08/18/19 0805 113.4 kg (250 lb)     Height 08/18/19 0805 1.626 m (5\' 4" )     Head Circumference --      Peak Flow --      Pain Score 08/18/19 0805 9     Pain Loc --      Pain Edu? --      Excl. in GC? --      Mouth/Throat: Mucous membranes are moist.     Cardiovascular: Normal rate, regular rhythm.  Good peripheral circulation. Respiratory: Normal respiratory effort.  No retractions..   Musculoskeletal: Mild swelling along the medial superior calf on the right, no cords, discoloration, normal perfusion distally  Neurologic:  Normal speech and language. No gross focal neurologic deficits are appreciated.  Skin:  Skin is warm, dry and intact. No rash noted. Psychiatric: Mood and affect are normal. Speech and behavior are normal.  ____________________________________________   LABS (all labs ordered are listed, but only abnormal results are displayed)  Labs Reviewed - No data to display ____________________________________________  EKG  None ____________________________________________  RADIOLOGY  Ultrasound negative for DVT ____________________________________________   PROCEDURES  Procedure(s) performed: No  Procedures   Critical Care performed: No ____________________________________________   INITIAL IMPRESSION / ASSESSMENT AND PLAN / ED COURSE  Pertinent labs & imaging results that were available during my care of the patient were reviewed by me and considered in my medical decision making (see chart for details).  Patient presents with likely calf strain as detailed above, ultrasound ordered to rule out DVT which was negative.  Will treat with NSAIDs, crutches, ice outpatient follow-up with PCP    ____________________________________________   FINAL CLINICAL IMPRESSION(S) / ED DIAGNOSES  Final diagnoses:  Strain of calf muscle, right, initial encounter        Note:  This document was prepared using Dragon voice recognition software and may include unintentional dictation errors.   Lavonia Drafts, MD 08/18/19 1040

## 2019-08-22 ENCOUNTER — Encounter: Payer: Self-pay | Admitting: Orthopaedic Surgery

## 2019-08-25 ENCOUNTER — Encounter: Payer: Self-pay | Admitting: Family Medicine

## 2019-08-25 ENCOUNTER — Ambulatory Visit: Payer: Medicaid Other | Admitting: Obstetrics & Gynecology

## 2019-08-28 ENCOUNTER — Other Ambulatory Visit: Payer: Self-pay

## 2019-08-28 ENCOUNTER — Ambulatory Visit (INDEPENDENT_AMBULATORY_CARE_PROVIDER_SITE_OTHER): Payer: Medicaid Other | Admitting: Neurology

## 2019-08-28 DIAGNOSIS — R202 Paresthesia of skin: Secondary | ICD-10-CM

## 2019-08-28 NOTE — Procedures (Signed)
[facility]  [place], [address]  [place], [place] [address] Tel: [phone] Fax:  [phone] Test Date:  08/28/2019  Patient: Nancy Dickerson DOB: 08/18/1981 Physician: Narda Amber, DO  Sex: Female Height: 5\' 4"  Ref Phys: Narda Amber, DO  ID#: [id number] Temp: 34.0C Technician:    Patient Complaints: This is a 38 year old female referred for evaluation of right leg pain.  NCV & EMG Findings: Extensive electrodiagnostic testing of the right lower extremity shows:  1. Right sural and superficial peroneal sensory responses are normal. 2. Right peroneal and tibial motor responses are normal. 3. Right tibial H reflex study is normal. 4. There is no evidence of active or chronic motor axonal loss changes affecting any of the tested muscles.  Motor unit configuration and recruitment pattern is within normal limits.  Impression: This is a normal study of the right lower extremity.  In particular, there is no evidence of a sensorimotor polyneuropathy or lumbosacral radiculopathy affecting the right leg.   ___________________________ Narda Amber, DO    Nerve Conduction Studies Anti Sensory Summary Table   Site NR Peak (ms) Norm Peak (ms) P-T Amp (V) Norm P-T Amp  Right Sup Peroneal Anti Sensory (Ant Lat Mall)  34C  12 cm    2.5 <4.5 20.8 >5  Right Sural Anti Sensory (Lat Mall)  34C  Calf    2.6 <4.5 23.0 >5   Motor Summary Table   Site NR Onset (ms) Norm Onset (ms) O-P Amp (mV) Norm O-P Amp Site1 Site2 Delta-0 (ms) Dist (cm) Vel (m/s) Norm Vel (m/s)  Right Peroneal Motor (Ext Dig Brev)  34C  Ankle    3.1 <5.5 7.7 >3 B Fib Ankle 7.1 39.0 55 >40  B Fib    10.2  7.5  Poplt B Fib 1.3 8.0 62 >40  Poplt    11.5  7.3         Right Tibial Motor (Abd Hall Brev)  34C  Ankle    3.0 <6.0 9.2 >8 Knee Ankle 7.7 38.0 49 >40  Knee    10.7  8.0          H Reflex Studies   NR H-Lat (ms) Lat Norm (ms) L-R H-Lat (ms)  Right Tibial (Gastroc)  34C     32.65 <35    EMG   Side Muscle Ins Act Fibs Psw Fasc Number Recrt Dur Dur. Amp Amp. Poly Poly. Comment  Right AntTibialis Nml Nml Nml Nml Nml Nml Nml Nml Nml Nml Nml Nml N/A  Right Gastroc Nml Nml Nml Nml Nml Nml Nml Nml Nml Nml Nml Nml N/A  Right Flex Dig Long Nml Nml Nml Nml Nml Nml Nml Nml Nml Nml Nml Nml N/A  Right RectFemoris Nml Nml Nml Nml Nml Nml Nml Nml Nml Nml Nml Nml N/A  Right GluteusMed Nml Nml Nml Nml Nml Nml Nml Nml Nml Nml Nml Nml N/A      Waveforms:

## 2019-09-01 ENCOUNTER — Ambulatory Visit: Payer: Medicaid Other | Attending: Orthopaedic Surgery | Admitting: Physical Therapy

## 2019-09-24 ENCOUNTER — Ambulatory Visit: Payer: Medicaid Other | Admitting: Orthopaedic Surgery

## 2019-09-29 ENCOUNTER — Ambulatory Visit: Payer: Medicaid Other | Admitting: Orthopaedic Surgery

## 2019-10-29 ENCOUNTER — Encounter: Payer: Self-pay | Admitting: Orthopaedic Surgery

## 2019-10-30 ENCOUNTER — Ambulatory Visit: Payer: Medicaid Other

## 2019-11-08 DIAGNOSIS — J449 Chronic obstructive pulmonary disease, unspecified: Secondary | ICD-10-CM | POA: Diagnosis not present

## 2019-11-08 DIAGNOSIS — Z79899 Other long term (current) drug therapy: Secondary | ICD-10-CM | POA: Diagnosis not present

## 2019-11-08 DIAGNOSIS — M222X2 Patellofemoral disorders, left knee: Secondary | ICD-10-CM | POA: Diagnosis not present

## 2019-11-08 DIAGNOSIS — M25562 Pain in left knee: Secondary | ICD-10-CM | POA: Diagnosis not present

## 2019-11-08 DIAGNOSIS — M25462 Effusion, left knee: Secondary | ICD-10-CM | POA: Diagnosis not present

## 2019-11-08 DIAGNOSIS — M25572 Pain in left ankle and joints of left foot: Secondary | ICD-10-CM | POA: Diagnosis not present

## 2019-11-15 DIAGNOSIS — J449 Chronic obstructive pulmonary disease, unspecified: Secondary | ICD-10-CM | POA: Diagnosis not present

## 2019-11-15 DIAGNOSIS — U071 COVID-19: Secondary | ICD-10-CM | POA: Diagnosis not present

## 2019-11-15 DIAGNOSIS — R05 Cough: Secondary | ICD-10-CM | POA: Diagnosis not present

## 2019-11-15 DIAGNOSIS — R6883 Chills (without fever): Secondary | ICD-10-CM | POA: Diagnosis not present

## 2019-11-15 DIAGNOSIS — Z79899 Other long term (current) drug therapy: Secondary | ICD-10-CM | POA: Diagnosis not present

## 2019-11-15 DIAGNOSIS — Z8673 Personal history of transient ischemic attack (TIA), and cerebral infarction without residual deficits: Secondary | ICD-10-CM | POA: Diagnosis not present

## 2019-12-09 ENCOUNTER — Ambulatory Visit: Payer: Medicaid Other

## 2020-04-01 ENCOUNTER — Encounter: Payer: Self-pay | Admitting: Internal Medicine

## 2020-04-23 ENCOUNTER — Ambulatory Visit: Payer: Medicaid Other | Admitting: Internal Medicine

## 2020-04-28 ENCOUNTER — Ambulatory Visit (INDEPENDENT_AMBULATORY_CARE_PROVIDER_SITE_OTHER): Payer: Medicaid Other | Admitting: Physician Assistant

## 2020-04-28 ENCOUNTER — Ambulatory Visit: Payer: Self-pay

## 2020-04-28 ENCOUNTER — Other Ambulatory Visit: Payer: Self-pay

## 2020-04-28 ENCOUNTER — Encounter: Payer: Self-pay | Admitting: Physician Assistant

## 2020-04-28 VITALS — Ht 64.0 in | Wt 250.0 lb

## 2020-04-28 DIAGNOSIS — M7061 Trochanteric bursitis, right hip: Secondary | ICD-10-CM

## 2020-04-28 DIAGNOSIS — M25562 Pain in left knee: Secondary | ICD-10-CM

## 2020-04-28 MED ORDER — METHYLPREDNISOLONE 4 MG PO TABS: ORAL_TABLET | ORAL | 0 refills | Status: DC

## 2020-04-28 NOTE — Progress Notes (Signed)
Office Visit Note   Patient: Nancy Dickerson           Date of Birth: 03/07/1981           MRN: [id number] Visit Date: 04/28/2020              Requested by: Ladell Pier, MD [address] [place],  [place] [address] PCP: Ladell Pier, MD   Assessment & Plan: Visit Diagnoses:  1. Left knee pain, unspecified chronicity   2. Trochanteric bursitis of right hip     Plan: We will send her to physical therapy for IT band stretching, back exercises, quad strengthening modalities and home exercise program.  Also placed her on a Medrol Dosepak.  We will see her back around 2 weeks to see what type of response she had to these treatments.  Questions were encouraged and answered.  Follow-Up Instructions: Return in about 2 weeks (around 05/12/2020).   Orders:  Orders Placed This Encounter  Procedures  . XR Knee 1-2 Views Left  . XR HIP UNILAT W OR W/O PELVIS 2-3 VIEWS RIGHT   Meds ordered this encounter  Medications  . methylPREDNISolone (MEDROL) 4 MG tablet    Sig: Take as directed    Dispense:  21 tablet    Refill:  0      Procedures: No procedures performed   Clinical Data: No additional findings.   Subjective: Chief Complaint  Patient presents with  . Right Hip - Pain  . Left Knee - Pain    HPI Nancy Dickerson is well-known to department service comes in today for right hip and left knee pain.  Patient has been treated for trochanteric bursitis in the past with injection on 08/13/2019 right trochanteric region states did not give her much relief.  States she is just had a lot going on is unable to get back into her office.  She has pain that radiates from her right hip down the right lower extremity.  Has some groin pain.  Left knee pain began over the last few months.  She notes that her knee feels weak like it could give way but has not given way.  She also has painful popping in the knee.  She notes most of the pain to the anterior aspect the knee.  Notes some  swelling.  She states she cannot walk for exercise due to the left knee pain right hip and right lower extremity pain.  Patient is nondiabetic. Patient with prior MRI 07/22/2019 showed no disc herniation, spinal canal stenosis or neural foraminal narrowing. Review of Systems Negative for fevers chills shortness of breath chest pain.  Objective: Vital Signs: Ht 5\' 4"  (1.626 m)   Wt 250 lb (113.4 kg)   BMI 42.91 kg/m   Physical Exam Constitutional:      Appearance: She is not ill-appearing or diaphoretic.  Pulmonary:     Effort: Pulmonary effort is normal.  Neurological:     Mental Status: She is alert and oriented to person, place, and time.  Psychiatric:        Mood and Affect: Mood normal.     Ortho Exam Lower extremities she has 5 out of 5 strength throughout lower extremities bilaterally.  Positive straight leg raise on the right negative on the left.  Good range of motion of both hips.  Tenderness over the right trochanteric region. Bilateral knees good range of motion but significant patellofemoral crepitus.  No abnormal warmth erythema or or effusion  of either knee.  Tenderness left knee along medial joint line.  Both knees slightly hyperextend. Specialty Comments:  No specialty comments available.  Imaging: No results found.   PMFS History: Patient Active Problem List   Diagnosis Date Noted  . Hip pain 08/13/2019  . Insomnia 07/07/2019  . Lumbar radicular pain 07/07/2019  . Uterine leiomyoma 03/12/2018  . Trochanteric bursitis of right hip 03/12/2018  . Class 3 severe obesity due to excess calories without serious comorbidity with body mass index (BMI) of 40.0 to 44.9 in adult ([place]) 03/12/2018  . Iron deficiency anemia due to chronic blood loss 01/29/2018  . Hypokalemia 01/29/2018  . Right sided weakness 01/29/2018   Past Medical History:  Diagnosis Date  . Anemia   . Asthma   . Bursitis of hip   . Cardiomegaly   . Cardiomegaly   . Chondromalacia of hip     . Chondromalacia of right knee   . COPD (chronic obstructive pulmonary disease) ([place])   . GERD (gastroesophageal reflux disease)   . H/O transfusion of packed red blood cells   . Knee pain   . Tinnitus     No family history on file.  Past Surgical History:  Procedure Laterality Date  . CERVICAL CONIZATION W/BX N/A 06/25/2019   Procedure: CONIZATION CERVIX WITH BIOPSY - COLD KNIFE;  Surgeon: Emily Filbert, MD;  Location: [facility];  Service: Gynecology;  Laterality: N/A;  . ENDOMETRIAL ABLATION N/A 06/25/2019   Procedure: MINERVA ABLATION;  Surgeon: Emily Filbert, MD;  Location: [facility];  Service: Gynecology;  Laterality: N/A;  [employer] rep will here confirmed on 06/20/19 CS  . HYSTEROSCOPY WITH D & C  06/25/2019   Procedure: DILATATION AND CURETTAGE /HYSTEROSCOPY;  Surgeon: Emily Filbert, MD;  Location: [facility];  Service: Gynecology;;  . NO PAST SURGERIES     Social History   Occupational History  . 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 Smoking status: Never Smoker  . Smokeless tobacco: Never Used  Vaping Use  . Vaping Use: Never used  Substance and Sexual Activity  . Alcohol use: Yes    Comment: rare  . Drug use: Yes    Types: Marijuana  . Sexual activity: Not on file

## 2020-04-30 ENCOUNTER — Telehealth: Payer: Self-pay | Admitting: Physician Assistant

## 2020-04-30 NOTE — Telephone Encounter (Signed)
Have reviewed Chart . She can see Artis Delay next week. Can not precribe pain meds

## 2020-04-30 NOTE — Telephone Encounter (Signed)
Reviewed Chart. She will need to see Artis Delay next week or go to ER

## 2020-04-30 NOTE — Telephone Encounter (Signed)
Can you advise? 

## 2020-04-30 NOTE — Telephone Encounter (Signed)
Patient called advised her right hip gave out and she was able to get in the car and but she can not get out. Patient asked if she can get something called in for pain. Patient said the medrol dose pack is not working. Patient said she do not want to keep going to the emergency room. The number to contact patient is  [phone]

## 2020-04-30 NOTE — Telephone Encounter (Signed)
Holding for CB/GC I spoke with patient and advised per note below from [employer]. Patient refused stating pointless to go to the ER because they are not going to do anything to help her pain. Told her we could see what Dr Ninfa Linden recommends when he returns next week.

## 2020-05-03 ENCOUNTER — Other Ambulatory Visit: Payer: Self-pay

## 2020-05-03 DIAGNOSIS — M7061 Trochanteric bursitis, right hip: Secondary | ICD-10-CM

## 2020-05-03 NOTE — Telephone Encounter (Signed)
See below

## 2020-05-04 ENCOUNTER — Other Ambulatory Visit: Payer: Self-pay

## 2020-05-04 DIAGNOSIS — M4807 Spinal stenosis, lumbosacral region: Secondary | ICD-10-CM

## 2020-05-04 NOTE — Telephone Encounter (Signed)
Ok.  MRI of the lumbar spine instead to rule out HNP. Thanks

## 2020-05-04 NOTE — Telephone Encounter (Signed)
Patient aware we are ordering an MRI for her

## 2020-05-04 NOTE — Telephone Encounter (Signed)
She states that she thinks it is actually her back She states she has a constant ache there and any pressure, even the pressure of taking a BM hurts her back She states it is really painful to stand or sit to long so she is unsure if it is her hip She is wondering if she can have the MRI on her back?

## 2020-05-04 NOTE — Telephone Encounter (Signed)
I am not sure what is going on with her hip and her stating that her hip "gives out".  Her x-rays did not show anything out of the ordinary and I did review the note from her last clinic visit.  Artis Delay was concerned about hip bursitis and sent her to therapy as well.  We can always obtain a MRI of the right hip to rule out any type of ligament tear or fracture if she would like.

## 2020-05-09 ENCOUNTER — Other Ambulatory Visit: Payer: Self-pay

## 2020-05-09 ENCOUNTER — Ambulatory Visit
Admission: RE | Admit: 2020-05-09 | Discharge: 2020-05-09 | Disposition: A | Discharge status: Home / Self Care | Payer: Medicaid Other | Source: Ambulatory Visit | Attending: Orthopaedic Surgery | Admitting: Orthopaedic Surgery

## 2020-05-09 DIAGNOSIS — M4807 Spinal stenosis, lumbosacral region: Secondary | ICD-10-CM

## 2020-05-09 DIAGNOSIS — M47817 Spondylosis without myelopathy or radiculopathy, lumbosacral region: Secondary | ICD-10-CM | POA: Diagnosis not present

## 2020-05-09 DIAGNOSIS — R531 Weakness: Secondary | ICD-10-CM | POA: Diagnosis not present

## 2020-05-09 DIAGNOSIS — M47816 Spondylosis without myelopathy or radiculopathy, lumbar region: Secondary | ICD-10-CM | POA: Diagnosis not present

## 2020-05-13 ENCOUNTER — Encounter: Payer: Self-pay | Admitting: Physician Assistant

## 2020-05-13 ENCOUNTER — Ambulatory Visit (INDEPENDENT_AMBULATORY_CARE_PROVIDER_SITE_OTHER): Payer: Medicaid Other | Admitting: Physician Assistant

## 2020-05-13 DIAGNOSIS — M7061 Trochanteric bursitis, right hip: Secondary | ICD-10-CM

## 2020-05-13 DIAGNOSIS — M25551 Pain in right hip: Secondary | ICD-10-CM | POA: Diagnosis not present

## 2020-05-13 MED ORDER — DULOXETINE HCL 20 MG PO CPEP
20.0000 mg | ORAL_CAPSULE | Freq: Every day | ORAL | 1 refills | Status: DC
Start: 2020-05-13 — End: 2020-07-16

## 2020-05-13 NOTE — Progress Notes (Signed)
Office Visit Note   Patient: Nancy Dickerson           Date of Birth: [birth year]           MRN: [id number] Visit Date: 05/13/2020              Requested by: Ladell Pier, MD [address] [place],  [place] [address] PCP: Ladell Pier, MD   Assessment & Plan: Visit Diagnoses:  1. Pain in right hip   2. Trochanteric bursitis of right hip     Plan: Discussed with Mai at length that I would recommend she try Cymbalta and she agrees.  We will send her to physical therapy to work on IT band stretching.  Like to see her back in 4 weeks to see what type of response she has had to the medication and therapy.  Questions were encouraged and answered at length.  Follow-Up Instructions: No follow-ups on file.   Orders:  No orders of the defined types were placed in this encounter.  Meds ordered this encounter  Medications  . DULoxetine (CYMBALTA) 20 MG capsule    Sig: Take 1 capsule (20 mg total) by mouth daily.    Dispense:  30 capsule    Refill:  1      Procedures: No procedures performed   Clinical Data: No additional findings.   Subjective: Chief Complaint  Patient presents with  . Lower Back - Follow-up    HPI [name] returns today to go over the MRI of her lumbar spine.  She continues to have numbness in the right thigh and low back pain.  She states at times that her leg becomes numb and she is unable to move it actively and has to move it using her arms.  She also states she is unable to stand for any longer than 15 minutes due to the leg becoming numb.  She also notes that constipation makes her leg numbness worse. MRI lumbar spine dated 05/10/2019 is reviewed with the patient.  This shows some very mild facet arthritic changes at L4-L5 and L5-S1.  There is no disc pathology no stenosis or neural compression.  Patient had an MRI of her lumbar spine in October 2020 which showed no neural compression or stenosis. She has seen neurologist and undergone EMG nerve  conduction studies 08/28/2019 and these were read as normal lower extremities.  She has tried gabapentin which she states caused her to be quite fatigued.  She states she does not want any pain medication.  It was recommended that she try Cymbalta which she has been reluctant to try.  She has not started physical therapy has helped yet for IT band stretching for the trochanteric bursitis on the right side.  Review of Systems See HPI.  Otherwise negative or noncontributory.  Objective: Vital Signs: There were no vitals taken for this visit.  Physical Exam Constitutional:      Appearance: She is not ill-appearing or diaphoretic.  Pulmonary:     Effort: Pulmonary effort is normal.  Neurological:     Mental Status: She is alert and oriented to person, place, and time.  Psychiatric:        Mood and Affect: Mood normal.     Ortho Exam Bilateral hips excellent range of motion.  She has pain with external rotation of the right hip.  Tenderness over the right trochanteric region.  Tenderness over the right lower lumbar paraspinous region.  Negative straight leg raise bilaterally.  Subjective decreased sensation over the right anterior thigh compared to the left thigh. Specialty Comments:  No specialty comments available.  Imaging: No results found.   PMFS History: Patient Active Problem List   Diagnosis Date Noted  . Hip pain 08/13/2019  . Insomnia 07/07/2019  . Lumbar radicular pain 07/07/2019  . Uterine leiomyoma 03/12/2018  . Trochanteric bursitis of right hip 03/12/2018  . Class 3 severe obesity due to excess calories without serious comorbidity with body mass index (BMI) of 40.0 to 44.9 in adult ([place]) 03/12/2018  . Iron deficiency anemia due to chronic blood loss 01/29/2018  . Hypokalemia 01/29/2018  . Right sided weakness 01/29/2018   Past Medical History:  Diagnosis Date  . Anemia   . Asthma   . Bursitis of hip   . Cardiomegaly   . Cardiomegaly   . Chondromalacia of hip    . Chondromalacia of right knee   . COPD (chronic obstructive pulmonary disease) ([place])   . GERD (gastroesophageal reflux disease)   . H/O transfusion of packed red blood cells   . Knee pain   . Tinnitus     History reviewed. No pertinent family history.  Past Surgical History:  Procedure Laterality Date  . CERVICAL CONIZATION W/BX N/A 06/25/2019   Procedure: CONIZATION CERVIX WITH BIOPSY - COLD KNIFE;  Surgeon: Emily Filbert, MD;  Location: [facility];  Service: Gynecology;  Laterality: N/A;  . ENDOMETRIAL ABLATION N/A 06/25/2019   Procedure: MINERVA ABLATION;  Surgeon: Emily Filbert, MD;  Location: [facility];  Service: Gynecology;  Laterality: N/A;  [employer] rep will here confirmed on 06/20/19 CS  . HYSTEROSCOPY WITH D & C  06/25/2019   Procedure: DILATATION AND CURETTAGE /HYSTEROSCOPY;  Surgeon: Emily Filbert, MD;  Location: [facility];  Service: Gynecology;;  . NO PAST SURGERIES     Social History   Occupational History  . 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 Smoking status: Never Smoker  . Smokeless tobacco: Never Used  Vaping Use  . Vaping Use: Never used  Substance and Sexual Activity  . Alcohol use: Yes    Comment: rare  . Drug use: Yes    Types: Marijuana  . Sexual activity: Not on file

## 2020-05-24 ENCOUNTER — Other Ambulatory Visit: Payer: Self-pay

## 2020-05-24 ENCOUNTER — Ambulatory Visit: Payer: Medicaid Other | Attending: Physician Assistant

## 2020-05-24 DIAGNOSIS — M7061 Trochanteric bursitis, right hip: Secondary | ICD-10-CM

## 2020-05-24 DIAGNOSIS — M6281 Muscle weakness (generalized): Secondary | ICD-10-CM

## 2020-05-24 DIAGNOSIS — M25551 Pain in right hip: Secondary | ICD-10-CM

## 2020-05-24 NOTE — Therapy (Signed)
[place], [place], [address] Phone: [phone]   Fax:  [phone]  Physical Therapy Evaluation  Patient Details  Name: Nancy Dickerson MRN: [id number] Date of Birth: 18-Sep-1981 Referring Provider (PT): Pete Pelt, PA-C   Encounter Date: 05/24/2020   PT End of Session - 05/24/20 0946    Visit Number 1    Number of Visits 4    Date for PT Re-Evaluation 06/21/20    Authorization Type [employer] Medicaid    PT Start Time 0933    PT Stop Time 1015    PT Time Calculation (min) 42 min    Activity Tolerance Patient tolerated treatment well    Behavior During Therapy [facility] for tasks assessed/performed           Past Medical History:  Diagnosis Date  . Anemia   . Asthma   . Bursitis of hip   . Cardiomegaly   . Cardiomegaly   . Chondromalacia of hip   . Chondromalacia of right knee   . COPD (chronic obstructive pulmonary disease) ([place])   . GERD (gastroesophageal reflux disease)   . H/O transfusion of packed red blood cells   . Knee pain   . Tinnitus     Past Surgical History:  Procedure Laterality Date  . CERVICAL CONIZATION W/BX N/A 06/25/2019   Procedure: CONIZATION CERVIX WITH BIOPSY - COLD KNIFE;  Surgeon: Emily Filbert, MD;  Location: [facility];  Service: Gynecology;  Laterality: N/A;  . ENDOMETRIAL ABLATION N/A 06/25/2019   Procedure: MINERVA ABLATION;  Surgeon: Emily Filbert, MD;  Location: [facility];  Service: Gynecology;  Laterality: N/A;  [employer] rep will here confirmed on 06/20/19 CS  . HYSTEROSCOPY WITH D & C  06/25/2019   Procedure: DILATATION AND CURETTAGE /HYSTEROSCOPY;  Surgeon: Emily Filbert, MD;  Location: [facility];  Service: Gynecology;;  . NO PAST SURGERIES      There were no vitals filed for this visit.    Subjective Assessment - 05/24/20 0937    Subjective Pt reports she has right hip pain that she has had for years, but last year  she started having numbness and excruciating pain that limits her walking. It goes from her lateral hip to outside of her knee. When she is at the store, she feels that she has to wait in the car for 30 minutes before she can drive. Cymbalta was prescribed and she feels nauseated often, and it additionally takes her appetite away.    Pertinent History Potential stroke 01/28/18, tattoo over R outer hip 2020 (pain started after), CSI 2019 (no relief)    Limitations Standing;Walking;House hold activities    How long can you stand comfortably? 10 min    How long can you walk comfortably? a few minutes    Diagnostic tests MRI low back: acute OA; MRI and XR of R hip: negative    Patient Stated Goals "Learn how to lose weight", improve her leg so she can get a job    Currently in Pain? No/denies    Pain Location Leg    Pain Orientation Right;Upper;Lateral    Pain Descriptors / Indicators Aching;Numbness    Pain Type Chronic pain    Pain Radiating Towards hip to knee    Pain Onset More than a month ago    Pain Frequency Intermittent    Aggravating Factors  standing, walking    Pain Relieving Factors sitting, medicine  Effect of Pain on Daily Activities cannot work              [facility] PT Assessment - 05/24/20 0001      Assessment   Medical Diagnosis Right Hip Pain, Trochanteric Bursitis    Referring Provider (PT) Pete Pelt, PA-C    Onset Date/Surgical Date 05/25/19    Hand Dominance Right    Next MD Visit 4 weeks    Prior Therapy None      Balance Screen   Has the patient fallen in the past 6 months No    Has the patient had a decrease in activity level because of a fear of falling?  Yes    Is the patient reluctant to leave their home because of a fear of falling?  Yes      ROM / Strength   AROM / PROM / Strength AROM;PROM;Strength      AROM   AROM Assessment Site Hip    Right/Left Hip Right;Left    Right Hip Flexion 110      PROM   PROM Assessment Site Hip     Right/Left Hip Right;Left    Right Hip External Rotation  58    Right Hip Internal Rotation  48                      Objective measurements completed on examination: See above findings.               PT Education - 05/24/20 1201    Education Details Diagnosis, prognosis, HEP, POC    Person(s) Educated Patient    Methods Explanation;Tactile cues;Verbal cues;Handout;Demonstration    Comprehension Verbalized understanding;Returned demonstration;Tactile cues required            PT Short Term Goals - 05/24/20 0948      PT SHORT TERM GOAL #1   Title Pt will be I and compliant with initial HEP.    Time 2    Period Weeks    Status New    Target Date 06/07/20      PT SHORT TERM GOAL #2   Title Pt will decrease R hip pain by 50%    Time 3    Period Weeks    Status New    Target Date 06/14/20      PT SHORT TERM GOAL #3   Title Pt will increase R hip MMT by at least 1 grade MMT.    Time 4    Period Weeks    Status New    Target Date 06/21/20             PT Long Term Goals - 05/24/20 1152      PT LONG TERM GOAL #1   Title Pt will decrease pain to <2/10 at most with activity, in order to return to work and participate in community activities.    Baseline unable to work    Time 8    Period Weeks    Status New    Target Date 07/19/20      PT LONG TERM GOAL #2   Title Pt will increase hip MMT to at least 4+/5, in order to stabilize pelvis and reduce ITB stress.    Baseline 3+/5 hip abd/ext    Time 8    Period Weeks    Status New    Target Date 07/19/20      PT LONG TERM GOAL #3   Title Pt will be able  to stand for > 30 minutes at a time, in order to perform IADLs.    Baseline 10 min    Time 8    Period Weeks    Status New    Target Date 07/19/20      PT LONG TERM GOAL #4   Title Pt will be able to ambulat for up to 30 minutes or more, in order to grocery shop, perform errands, and potentially for future employment.    Baseline a few  minutes    Time 8    Period Weeks    Status New    Target Date 07/19/20                  Plan - 05/24/20 0947    Clinical Impression Statement Pt is a 39 year old female with history of chronic R hip pain that has been worse over the last year. She reports numbness intermittently from right outer hip to knee and limited ability to perform IADLs, as well as work. Pt demonstrates weakness greatest in hip ABD/ER with pain into active hip flexion; soft tissue restrictions to hip adductors, rotators, and flexors; increased lumbar lordosis, and limited functional ability. Pt was educated on diagnosis, prognosis, HEP, and POC with pt verbalizing understanding and consent to tx. She will benefit from skilled physical therapy 2x/week for 8 weeks (1x/week for 1st 3 weeks secondary to medication authorization), in order to address impairments for return to participation in life activity and work.    Personal Factors and Comorbidities Comorbidity 1;Comorbidity 2;Past/Current Experience;Time since onset of injury/illness/exacerbation    Comorbidities Arthritis, COPD    Examination-Activity Limitations Transfers;Lift;Stairs;Squat;Locomotion Level;Stand    Examination-Participation Restrictions Shop;Community Activity;Occupation;Interpersonal Relationship;Laundry;Cleaning    Stability/Clinical Decision Making Stable/Uncomplicated    Clinical Decision Making Low    Rehab Potential Good    PT Frequency 2x / week   1x/week 1st 3 weeks per Medicaid Authorization   PT Duration 8 weeks    PT Next Visit Plan review HEP, manual as indicated to address soft tissue restrictions/MET for increased hip strength, progress lumbopelvic rhythm, glute and core strength, hip flexibility    PT Home Exercise Plan [id number]: figure 4 S, hip adductor frog S with diaphragmatic breathing, supine PPT, clams    Consulted and Agree with Plan of Care Patient           Patient will benefit from skilled therapeutic intervention  in order to improve the following deficits and impairments:  Difficulty walking, Improper body mechanics, Postural dysfunction, Pain, Obesity, Impaired flexibility, Increased fascial restricitons, Decreased strength, Decreased activity tolerance, Impaired perceived functional ability  Visit Diagnosis: Pain in right hip - Plan: PT plan of care cert/re-cert  Trochanteric bursitis of right hip - Plan: PT plan of care cert/re-cert  Muscle weakness (generalized) - Plan: PT plan of care cert/re-cert     Problem List Patient Active Problem List   Diagnosis Date Noted  . Hip pain 08/13/2019  . Insomnia 07/07/2019  . Lumbar radicular pain 07/07/2019  . Uterine leiomyoma 03/12/2018  . Trochanteric bursitis of right hip 03/12/2018  . Class 3 severe obesity due to excess calories without serious comorbidity with body mass index (BMI) of 40.0 to 44.9 in adult ([place]) 03/12/2018  . Iron deficiency anemia due to chronic blood loss 01/29/2018  . Hypokalemia 01/29/2018  . Right sided weakness 01/29/2018    Izell Gleneagle, PT, DPT 05/24/2020, 12:03 PM  [facility] [address] [place], [place], [address]  Phone: [phone]   Fax:  [phone]  Name: Nancy Dickerson MRN: [id number] Date of Birth: 10/07/1981

## 2020-06-02 ENCOUNTER — Ambulatory Visit: Payer: Medicaid Other | Admitting: Physical Therapy

## 2020-06-03 ENCOUNTER — Ambulatory Visit (INDEPENDENT_AMBULATORY_CARE_PROVIDER_SITE_OTHER): Payer: Medicaid Other | Admitting: Obstetrics and Gynecology

## 2020-06-03 ENCOUNTER — Other Ambulatory Visit: Payer: Self-pay

## 2020-06-03 ENCOUNTER — Encounter: Payer: Self-pay | Admitting: Obstetrics and Gynecology

## 2020-06-03 VITALS — BP 125/86 | HR 78 | Ht 64.0 in | Wt 270.7 lb

## 2020-06-03 DIAGNOSIS — D251 Intramural leiomyoma of uterus: Secondary | ICD-10-CM | POA: Diagnosis not present

## 2020-06-03 DIAGNOSIS — N938 Other specified abnormal uterine and vaginal bleeding: Secondary | ICD-10-CM

## 2020-06-03 DIAGNOSIS — Z6841 Body Mass Index (BMI) 40.0 and over, adult: Secondary | ICD-10-CM | POA: Diagnosis not present

## 2020-06-03 MED ORDER — NORETHIN ACE-ETH ESTRAD-FE 1-20 MG-MCG(24) PO TABS: 1.0000 | ORAL_TABLET | Freq: Every day | ORAL | 6 refills | Status: DC

## 2020-06-03 NOTE — Progress Notes (Signed)
Pt states has been bleeding on & off since Ablation in  06/25/2019 by Dr. Hulan Fray. Stopped bleeding recently 2 weeks ago.

## 2020-06-03 NOTE — Progress Notes (Signed)
   Subjective:    Patient ID: Nancy Dickerson, female    DOB: [birth year], 39 y.o.   MRN: [id number]  HPI  39 yo G7P6 seen at [facility] for discussion of continued vaginal bleeding.  Pt had hysteroscopy, cone biopsy and uterine ablation for DUB September 2020. Operative note was reviewed. Per pt she still has irregular heavy vaginal bleeding.  Her cycles are not regular.  She may skip 1-1.5 months before another cycle.  Pt had expectation that she would have no bleeding at all.  Explained to pt this is the optimal outcome for the procedure, but a more reasonable outcome would be decreased and lighter bleeding.  Pt is unsure about diabetes status or PCOS.    Review of Systems  Constitutional: Negative.   HENT: Negative.   Eyes: Negative.   Respiratory: Negative.   Cardiovascular: Negative.   Gastrointestinal:       Decreased appetite  Endocrine: Negative.   Genitourinary: Positive for vaginal bleeding.  Musculoskeletal: Negative.   Allergic/Immunologic: Negative.   Neurological: Negative.   Hematological: Negative.   Psychiatric/Behavioral: Negative.        Objective:   Physical Exam Constitutional:      Appearance: Normal appearance. She is obese.  HENT:     Head: Normocephalic and atraumatic.     Nose: Nose normal.  Cardiovascular:     Rate and Rhythm: Regular rhythm.     Heart sounds: Normal heart sounds.  Pulmonary:     Effort: Pulmonary effort is normal.     Breath sounds: Normal breath sounds.  Abdominal:     General: There is no distension.     Palpations: Abdomen is soft. There is no mass.     Tenderness: There is no abdominal tenderness.     Comments: Obese   Genitourinary:    Comments: SSE: vagina: WNL Cervix: WNL, light uterine bleeding Uterus normal size with bimanual Musculoskeletal:        General: Normal range of motion.     Cervical back: Normal range of motion.  Skin:    General: Skin is warm and dry.  Neurological:     General: No focal deficit  present.     Mental Status: She is alert.  Psychiatric:        Mood and Affect: Mood normal.        Behavior: Behavior normal.    Today's Vitals   06/03/20 1032  BP: 125/86  Pulse: 78  Weight: 270 lb 11.2 oz (122.8 kg)  Height: 5\' 4"  (1.626 m)  PainSc: 0-No pain   Body mass index is 46.47 kg/m.        Assessment & Plan:   1. Intramural leiomyoma of uterus Reviewed previous ultrasound results  2. Class 3 severe obesity due to excess calories without serious comorbidity with body mass index (BMI) of 40.0 to 44.9 in adult ([facility])  - Hemoglobin A1c - Referral to Nutrition and Diabetes Services  3. Dysfunctional uterine bleeding Pt is very suspicious for PCOS Will check hgb A1c, advised pt she needs glucose challenge from her PCP Advised 10% weight loss to address possible elevated insulin, decrease insulin resistance OCP for cycle control for now, would not repeat ablation.  Hysterectomy is a possibility but cases will be done on critical need. F/u in 3 months while on OCP   Lynnda Shields, MD Faculty attending, [facility] [employer]

## 2020-06-03 NOTE — Patient Instructions (Signed)
Diet for Polycystic Ovary Syndrome Polycystic ovary syndrome (PCOS) is a disorder of the chemicals (hormones) that regulate a woman's reproductive system, including monthly periods (menstruation). The condition causes important hormones to be out of balance. PCOS can:  Stop your periods or make them irregular.  Cause cysts to develop on your ovaries.  Make it difficult to get pregnant.  Stop your body from responding to the effects of insulin (insulin resistance). Insulin resistance can lead to obesity and diabetes. Changing what you eat can help you manage PCOS and improve your health. Following a balanced diet can help you lose weight and improve the way that your body uses insulin. What are tips for following this plan?  Follow a balanced diet for meals and snacks. Eat breakfast, lunch, dinner, and one or two snacks every day.  Include protein in each meal and snack.  Choose whole grains instead of products that are made with refined flour.  Eat a variety of foods.  Exercise regularly as told by your health care provider. Aim to do 30 or more minutes of exercise on most days of the week.  If you are overweight or obese: ? Pay attention to how many calories you eat. Cutting down on calories can help you lose weight. ? Work with your health care provider or a diet and nutrition specialist (dietitian) to figure out how many calories you need each day. What foods can I eat?  Fruits Include a variety of colors and types. All fruits are helpful for PCOS. Vegetables Include a variety of colors and types. All vegetables are helpful for PCOS. Grains Whole grains, such as whole wheat. Whole-grain breads, crackers, cereals, and pasta. Unsweetened oatmeal, bulgur, barley, quinoa, and brown rice. Tortillas made from corn or whole-wheat flour. Meats and other proteins Low-fat (lean) proteins, such as fish, chicken, beans, eggs, and tofu. Dairy Low-fat dairy products, such as skim milk,  cheese sticks, and yogurt. Beverages Low-fat or fat-free drinks, such as water, low-fat milk, sugar-free drinks, and small amounts of 100% fruit juice. Seasonings and condiments Ketchup. Mustard. Barbecue sauce. Relish. Low-fat or fat-free mayonnaise. Fats and oils Olive oil or canola oil. Walnuts and almonds. The items listed above may not be a complete list of recommended foods and beverages. Contact a dietitian for more options. What foods are not recommended? Foods that are high in calories or fat. Fried foods. Sweets. Products that are made from refined white flour, including white bread, pastries, white rice, and pasta. The items listed above may not be a complete list of foods and beverages to avoid. Contact a dietitian for more information. Summary  PCOS is a hormonal imbalance that affects a woman's reproductive system.  You can help to manage your PCOS by exercising regularly and eating a healthy, varied diet of vegetables, fruit, whole grains, low-fat (lean) protein, and low-fat dairy products.  Changing what you eat can improve the way that your body uses insulin, help your hormones reach normal levels, and help you lose weight. This information is not intended to replace advice given to you by your health care provider. Make sure you discuss any questions you have with your health care provider. Document Revised: 01/15/2019 Document Reviewed: 07/30/2017 Elsevier Patient Education  [place]. Polycystic Ovarian Syndrome  Polycystic ovarian syndrome (PCOS) is a common hormonal disorder among women of reproductive age. In most women with PCOS, many small fluid-filled sacs (cysts) grow on the ovaries, and the cysts are not part of a normal menstrual cycle.  PCOS can cause problems with your menstrual periods and make it difficult to get pregnant. It can also cause an increased risk of miscarriage with pregnancy. If it is not treated, PCOS can lead to serious health problems,  such as diabetes and heart disease. What are the causes? The cause of PCOS is not known, but it may be the result of a combination of certain factors, such as:  Irregular menstrual cycle.  High levels of certain hormones (androgens).  Problems with the hormone that helps to control blood sugar (insulin resistance).  Certain genes. What increases the risk? This condition is more likely to develop in women who have a family history of PCOS. What are the signs or symptoms? Symptoms of PCOS may include:  Multiple ovarian cysts.  Infrequent periods or no periods.  Periods that are too frequent or too heavy.  Unpredictable periods.  Inability to get pregnant (infertility) because of not ovulating.  Increased growth of hair on the face, chest, stomach, back, thumbs, thighs, or toes.  Acne or oily skin. Acne may develop during adulthood, and it may not respond to treatment.  Pelvic pain.  Weight gain or obesity.  Patches of thickened and dark brown or black skin on the neck, arms, breasts, or thighs (acanthosis nigricans).  Excess hair growth on the face, chest, abdomen, or upper thighs (hirsutism). How is this diagnosed? This condition is diagnosed based on:  Your medical history.  A physical exam, including a pelvic exam. Your health care provider may look for areas of increased hair growth on your skin.  Tests, such as: ? Ultrasound. This may be used to examine the ovaries and the lining of the uterus (endometrium) for cysts. ? Blood tests. These may be used to check levels of sugar (glucose), female hormone (testosterone), and female hormones (estrogen and progesterone) in your blood. How is this treated? There is no cure for PCOS, but treatment can help to manage symptoms and prevent more health problems from developing. Treatment varies depending on:  Your symptoms.  Whether you want to have a baby or whether you need birth control (contraception). Treatment may  include nutrition and lifestyle changes along with:  Progesterone hormone to start a menstrual period.  Birth control pills to help you have regular menstrual periods.  Medicines to make you ovulate, if you want to get pregnant.  Medicine to reduce excessive hair growth.  Surgery, in severe cases. This may involve making small holes in one or both of your ovaries. This decreases the amount of testosterone that your body produces. Follow these instructions at home:  Take over-the-counter and prescription medicines only as told by your health care provider.  Follow a healthy meal plan. This can help you reduce the effects of PCOS. ? Eat a healthy diet that includes lean proteins, complex carbohydrates, fresh fruits and vegetables, low-fat dairy products, and healthy fats. Make sure to eat enough fiber.  If you are overweight, lose weight as told by your health care provider. ? Losing 10% of your body weight may improve symptoms. ? Your health care provider can determine how much weight loss is best for you and can help you lose weight safely.  Keep all follow-up visits as told by your health care provider. This is important. Contact a health care provider if:  Your symptoms do not get better with medicine.  You develop new symptoms. This information is not intended to replace advice given to you by your health care provider. Make sure you  discuss any questions you have with your health care provider. Document Revised: 09/07/2017 Document Reviewed: 03/12/2016 Elsevier Patient Education  2020 [employer].

## 2020-06-04 ENCOUNTER — Ambulatory Visit: Payer: Medicaid Other | Admitting: Physical Therapy

## 2020-06-04 ENCOUNTER — Encounter: Payer: Self-pay | Admitting: Physical Therapy

## 2020-06-04 DIAGNOSIS — M25551 Pain in right hip: Secondary | ICD-10-CM

## 2020-06-04 DIAGNOSIS — M7061 Trochanteric bursitis, right hip: Secondary | ICD-10-CM

## 2020-06-04 DIAGNOSIS — M6281 Muscle weakness (generalized): Secondary | ICD-10-CM

## 2020-06-04 LAB — HEMOGLOBIN A1C
Est. average glucose Bld gHb Est-mCnc: 117 mg/dL
Hgb A1c MFr Bld: 5.7 % — ABNORMAL HIGH (ref 4.8–5.6)

## 2020-06-04 NOTE — Patient Instructions (Signed)
Access Code: [email or web address] URL: [email or web address] Date: 06/04/2020 Prepared by: Hessie Diener  Supine ITB Stretch with Strap - 2 x daily - 7 x weekly - 1 sets - 3 reps - 20-30 hold Supine Bridge - 2 x daily - 7 x weekly - 3 sets - 10 reps - 5 hold

## 2020-06-04 NOTE — Therapy (Addendum)
[facility] [place], [place], [address] Phone: [phone]   Fax:  [phone]  Physical Therapy Treatment  Patient Details  Name: Nancy Dickerson MRN: [id number] Date of Birth: 02/15/1981 Referring Provider (PT): Pete Pelt, PA-C   Encounter Date: 06/04/2020   PT End of Session - 06/04/20 1019    Visit Number 2    Number of Visits 4    Date for PT Re-Evaluation 06/21/20    Authorization Type [employer] Medicaid    PT Start Time 1015    PT Stop Time 1100    PT Time Calculation (min) 45 min           Past Medical History:  Diagnosis Date  . Anemia   . Asthma   . Bursitis of hip   . Cardiomegaly   . Cardiomegaly   . Chondromalacia of hip   . Chondromalacia of right knee   . COPD (chronic obstructive pulmonary disease) ([place])   . GERD (gastroesophageal reflux disease)   . H/O transfusion of packed red blood cells   . Knee pain   . Tinnitus     Past Surgical History:  Procedure Laterality Date  . CERVICAL CONIZATION W/BX N/A 06/25/2019   Procedure: CONIZATION CERVIX WITH BIOPSY - COLD KNIFE;  Surgeon: Emily Filbert, MD;  Location: [facility];  Service: Gynecology;  Laterality: N/A;  . ENDOMETRIAL ABLATION N/A 06/25/2019   Procedure: MINERVA ABLATION;  Surgeon: Emily Filbert, MD;  Location: [facility];  Service: Gynecology;  Laterality: N/A;  Minerva rep will here confirmed on 06/20/19 CS  . HYSTEROSCOPY WITH D & C  06/25/2019   Procedure: DILATATION AND CURETTAGE /HYSTEROSCOPY;  Surgeon: Emily Filbert, MD;  Location: [facility];  Service: Gynecology;;  . NO PAST SURGERIES      There were no vitals filed for this visit.   Subjective Assessment - 06/04/20 1025    Subjective I need to lose weight but I cannot get into a nutritionist. No pain today. It tried to hurt this morning. it depends on how much I walk    Currently in Pain? No/denies                              [facility] Adult PT Treatment/Exercise - 06/04/20 0001      Self-Care   Self-Care Other Self-Care Comments    Other Self-Care Comments  Aerobic Exercise, Activity and calorie trackers for weigt loss, use her [employer] membership for access to cardio-trial of Nustep today      Exercises   Exercises Lumbar      Lumbar Exercises: Stretches   Active Hamstring Stretch 3 reps;30 seconds    ITB Stretch 3 reps;30 seconds    Figure 4 Stretch 3 reps;30 seconds    Figure 4 Stretch Limitations push and pull       Lumbar Exercises: Aerobic   Nustep L4 UE/LE x 10 minutes    discussing step trackers, calroie trackers for weight loss     Lumbar Exercises: Supine   Pelvic Tilt 20 reps    Bridge 10 reps      Lumbar Exercises: Sidelying   Clam 20 reps    Other Sidelying Lumbar Exercises reverse clam x 20                   PT Education - 06/04/20 1101    Education  Details HEP    Person(s) Educated Patient    Methods Explanation;Handout    Comprehension Verbalized understanding            PT Short Term Goals - 05/24/20 0948      PT SHORT TERM GOAL #1   Title Pt will be I and compliant with initial HEP.    Time 2    Period Weeks    Status New    Target Date 06/07/20      PT SHORT TERM GOAL #2   Title Pt will decrease R hip pain by 50%    Time 3    Period Weeks    Status New    Target Date 06/14/20      PT SHORT TERM GOAL #3   Title Pt will increase R hip MMT by at least 1 grade MMT.    Time 4    Period Weeks    Status New    Target Date 06/21/20             PT Long Term Goals - 05/24/20 1152      PT LONG TERM GOAL #1   Title Pt will decrease pain to <2/10 at most with activity, in order to return to work and participate in community activities.    Baseline unable to work    Time 8    Period Weeks    Status New    Target Date 07/19/20      PT LONG TERM GOAL #2   Title Pt will increase hip MMT to at least 4+/5, in order to  stabilize pelvis and reduce ITB stress.    Baseline 3+/5 hip abd/ext    Time 8    Period Weeks    Status New    Target Date 07/19/20      PT LONG TERM GOAL #3   Title Pt will be able to stand for > 30 minutes at a time, in order to perform IADLs.    Baseline 10 min    Time 8    Period Weeks    Status New    Target Date 07/19/20      PT LONG TERM GOAL #4   Title Pt will be able to ambulat for up to 30 minutes or more, in order to grocery shop, perform errands, and potentially for future employment.    Baseline a few minutes    Time 8    Period Weeks    Status New    Target Date 07/19/20                 Plan - 06/04/20 1101    Clinical Impression Statement Pt arrives reporting min compliance with HEP due to the first week of shool for her 4 kids that live with her. She does not have pain today and states that it depends on how much she walks. Her MD has referrred her to a nutritionist and recommended she ask about weight loss tips at physical therapy. Began Nustep and suggested she use her [employer] appt to complete cardio and perform HEP. She was receptive to all information. Reviewed HEP and added ITB stretch and bridging. Began soft foam roller to lateral hip and IT Band. Session tolerated well.    PT Next Visit Plan review HEP, manual as indicated to address soft tissue restrictions/MET for increased hip strength, progress lumbopelvic rhythm, glute and core strength, hip flexibility    PT Home Exercise Plan [id number]: figure 4 S, hip  adductor frog S with diaphragmatic breathing, supine PPT, clams, 06/04/20- added bridge and ITB stretch with strap           Patient will benefit from skilled therapeutic intervention in order to improve the following deficits and impairments:  Difficulty walking, Improper body mechanics, Postural dysfunction, Pain, Obesity, Impaired flexibility, Increased fascial restricitons, Decreased strength, Decreased activity tolerance, Impaired perceived  functional ability  Visit Diagnosis: Pain in right hip  Trochanteric bursitis of right hip  Muscle weakness (generalized)     Problem List Patient Active Problem List   Diagnosis Date Noted  . Dysfunctional uterine bleeding 06/03/2020  . Hip pain 08/13/2019  . Insomnia 07/07/2019  . Lumbar radicular pain 07/07/2019  . Uterine leiomyoma 03/12/2018  . Trochanteric bursitis of right hip 03/12/2018  . Class 3 severe obesity due to excess calories without serious comorbidity with body mass index (BMI) of 40.0 to 44.9 in adult ([place]) 03/12/2018  . Iron deficiency anemia due to chronic blood loss 01/29/2018  . Hypokalemia 01/29/2018  . Right sided weakness 01/29/2018    Dorene Ar, PTA 06/04/2020, 11:09 AM  [facility] [address] [place], [place], [address] Phone: [phone]   Fax:  [phone]  Name: Nancy Dickerson MRN: [id number] Date of Birth: 12/16/1980  PHYSICAL THERAPY DISCHARGE SUMMARY  Visits from Start of Care: 2  Current functional level related to goals / functional outcomes: unknown   Remaining deficits: unknown   Education / Equipment: HEP  Plan: Patient agrees to discharge.  Patient goals were not met. Patient is being discharged due to not returning since the last visit.  ?????        Phill Myron. Yvette Rack, PT, DPT

## 2020-06-07 ENCOUNTER — Ambulatory Visit: Payer: Medicaid Other | Admitting: Physical Therapy

## 2020-06-09 ENCOUNTER — Telehealth: Payer: Self-pay | Admitting: Obstetrics and Gynecology

## 2020-06-09 NOTE — Telephone Encounter (Signed)
Spoke with patient to get her scheduled with Levada Dy for nutrition education. Patient stated that she is not able to miss any work right not because she just started a new job and is in her probation period. Patient instructed to give the office a call back when she is able to schedule this appointment.

## 2020-06-10 ENCOUNTER — Telehealth: Payer: Self-pay

## 2020-06-10 NOTE — Telephone Encounter (Signed)
Spoke with pt re: no show appt. Pt states she forgot after she returned home from work. Pt was reminded of her upcoming appt on 9/11 and of the attendance policy.

## 2020-06-19 ENCOUNTER — Ambulatory Visit: Payer: Medicaid Other | Attending: Orthopaedic Surgery

## 2020-06-21 ENCOUNTER — Encounter (HOSPITAL_COMMUNITY): Payer: Self-pay

## 2020-06-21 ENCOUNTER — Telehealth: Payer: Self-pay

## 2020-06-21 ENCOUNTER — Other Ambulatory Visit: Payer: Self-pay

## 2020-06-21 ENCOUNTER — Inpatient Hospital Stay (HOSPITAL_COMMUNITY)
Admission: AD | Admit: 2020-06-21 | Discharge: 2020-06-21 | Discharge status: Against Medical Advice | Payer: Medicaid Other | Attending: Obstetrics and Gynecology | Admitting: Obstetrics and Gynecology

## 2020-06-21 DIAGNOSIS — N939 Abnormal uterine and vaginal bleeding, unspecified: Secondary | ICD-10-CM | POA: Insufficient documentation

## 2020-06-21 DIAGNOSIS — Z3202 Encounter for pregnancy test, result negative: Secondary | ICD-10-CM | POA: Insufficient documentation

## 2020-06-21 DIAGNOSIS — R58 Hemorrhage, not elsewhere classified: Secondary | ICD-10-CM | POA: Diagnosis not present

## 2020-06-21 DIAGNOSIS — R252 Cramp and spasm: Secondary | ICD-10-CM | POA: Insufficient documentation

## 2020-06-21 DIAGNOSIS — R0902 Hypoxemia: Secondary | ICD-10-CM | POA: Diagnosis not present

## 2020-06-21 LAB — BASIC METABOLIC PANEL
Anion gap: 10 (ref 5–15)
BUN: 9 mg/dL (ref 6–20)
CO2: 26 mmol/L (ref 22–32)
Calcium: 9.3 mg/dL (ref 8.9–10.3)
Chloride: 100 mmol/L (ref 98–111)
Creatinine, Ser: 0.94 mg/dL (ref 0.44–1.00)
GFR calc Af Amer: >60 mL/min (ref >60)
GFR calc non Af Amer: >60 mL/min (ref >60)
Glucose, Bld: 102 mg/dL — ABNORMAL HIGH (ref 70–99)
Potassium: 3.6 mmol/L (ref 3.5–5.1)
Sodium: 136 mmol/L (ref 135–145)

## 2020-06-21 LAB — CBC
HCT: 34.3 % — ABNORMAL LOW (ref 36.0–46.0)
Hemoglobin: 10.1 g/dL — ABNORMAL LOW (ref 12.0–15.0)
MCH: 24.6 pg — ABNORMAL LOW (ref 26.0–34.0)
MCHC: 29.4 g/dL — ABNORMAL LOW (ref 30.0–36.0)
MCV: 83.5 fL (ref 80.0–100.0)
Platelets: 379 10*3/uL (ref 150–400)
RBC: 4.11 MIL/uL (ref 3.87–5.11)
RDW: 17.1 % — ABNORMAL HIGH (ref 11.5–15.5)
WBC: 8.2 10*3/uL (ref 4.0–10.5)
nRBC: 0 % (ref 0.0–0.2)

## 2020-06-21 LAB — I-STAT BETA HCG BLOOD, ED (MC, WL, AP ONLY): I-stat hCG, quantitative: <5 m[IU]/mL (ref <5)

## 2020-06-21 NOTE — ED Triage Notes (Signed)
Pt arrives to ED w/ c/o heavy vaginal bleeding x 1 day. Pt c/o lower abdominal pain. EMS VSS.

## 2020-06-21 NOTE — ED Triage Notes (Signed)
Emergency Medicine Provider OB Triage Evaluation Note  Nancy Dickerson is a 39 y.o. female, G7P0016, at Unknown gestation who presents to the emergency department with complaints of sudden onset vaginal bleeding beginning around 3 PM today.  Reports bleeding is very heavy.  Endorses lower abdominal/pelvic pains that she describes as "contractions".  She states that she had an ablation procedure several months ago and has had irregular menses since then but they are not typically this heavy.  Review of  Systems  Positive: Abdominal pain, vaginal bleeding Negative: Fevers, nausea, vomiting  Physical Exam  BP (!) 147/88 (BP Location: Right Arm)   Pulse 90   Temp 98.4 F (36.9 C) (Oral)   Resp 20   SpO2 99%  General: Awake, no distress  HEENT: Atraumatic  Resp: Normal effort  Cardiac: Normal rate Abd: Nondistended, nontender  MSK: Moves all extremities without difficulty Neuro: Speech clear  Medical Decision Making  Pt evaluated for pregnancy concern and is stable for transfer to MAU. Pt is in agreement with plan for transfer.  4:58 PM Discussed with MAU APP, Nancy Dickerson, who accepts patient in transfer.  Clinical Impression  No diagnosis found.     Renita Papa, PA-C 06/21/20 1702

## 2020-06-21 NOTE — MAU Note (Signed)
Assisted with change of clothes and lg pad given.

## 2020-06-21 NOTE — MAU Provider Note (Addendum)
S Ms. GELSEY AMYX is a 39 y.o. G62P0016 non-pregnant female who presents to MAU today with complaint of moderate vaginal bleeding with cramping. Her cramping comes in waves and causes gushes of blood. This began yesterday. She has not been using pads for her bleeding, but has bled through her underwear at least twice. She has an Essure and has had an ablation.   O BP (!) 142/95 (BP Location: Right Arm)   Pulse 94   Temp 98.4 F (36.9 C) (Oral)   Resp 18   Ht 5\' 4"  (1.626 m)   Wt 270 lb (122.5 kg)   LMP 06/04/2020 Comment: has "essure coils", also on bc pills  SpO2 100%   BMI 46.35 kg/m  Physical Exam Vitals and nursing note reviewed.  Constitutional:      Appearance: She is well-developed. She is obese.  Eyes:     Extraocular Movements: Extraocular movements intact.     Pupils: Pupils are equal, round, and reactive to light.  Cardiovascular:     Rate and Rhythm: Normal rate.  Pulmonary:     Effort: Pulmonary effort is normal.  Neurological:     Mental Status: She is alert and oriented to person, place, and time.  Psychiatric:        Mood and Affect: Mood normal.        Behavior: Behavior normal.    A Non pregnant female Medical screening exam complete Nonmenstrual vaginal bleeding Labs drawn in ED  Results for orders placed or performed during the hospital encounter of 06/21/20 (from the past 24 hour(s))  CBC     Status: Abnormal   Collection Time: 06/21/20  3:58 PM  Result Value Ref Range   WBC 8.2 4.0 - 10.5 K/uL   RBC 4.11 3.87 - 5.11 MIL/uL   Hemoglobin 10.1 (L) 12.0 - 15.0 g/dL   HCT 34.3 (L) 36 - 46 %   MCV 83.5 80.0 - 100.0 fL   MCH 24.6 (L) 26.0 - 34.0 pg   MCHC 29.4 (L) 30.0 - 36.0 g/dL   RDW 17.1 (H) 11.5 - 15.5 %   Platelets 379 150 - 400 K/uL   nRBC 0.0 0.0 - 0.2 %  Basic metabolic panel     Status: Abnormal   Collection Time: 06/21/20  3:58 PM  Result Value Ref Range   Sodium 136 135 - 145 mmol/L   Potassium 3.6 3.5 - 5.1 mmol/L   Chloride 100  98 - 111 mmol/L   CO2 26 22 - 32 mmol/L   Glucose, Bld 102 (H) 70 - 99 mg/dL   BUN 9 6 - 20 mg/dL   Creatinine, Ser 0.94 0.44 - 1.00 mg/dL   Calcium 9.3 8.9 - 10.3 mg/dL   GFR calc non Af Amer >60 >60 mL/min   GFR calc Af Amer >60 >60 mL/min   Anion gap 10 5 - 15  I-Stat beta hCG blood, ED     Status: None   Collection Time: 06/21/20  4:49 PM  Result Value Ref Range   I-stat hCG, quantitative <5.0 <5 mIU/mL   Comment 3            P Reviewed labs and vitals with patient during medical screening exam. Gave reassurance that she is medically stable. Explained at length the purpose of MAU (stabilization of patients) and encouraged close follow-up with her regular GYN. Pt requested ultrasound, I explained that GYN ultrasounds are done outpatient and that I would help her get scheduled at [facility].  Pt asked to be returned to waiting room.  Went to offer Megace prescription until she can be seen at [facility], pt had left AMA.  Gaylan Gerold, CNM, MSN, [facility] 06/21/20 7:19 PM

## 2020-06-21 NOTE — MAU Note (Signed)
Cramping started yesterday, felt like contractions.  Started bleeding today. No pad, "made one with tissue", bled through that and her clothes.  Was working from home, blood "just started gushing out of her", that was at 3.  Called 911, was brought to ER.  Blood drawn in ER, neg preg.. has "essure" and is on birth control pills.

## 2020-06-21 NOTE — Telephone Encounter (Signed)
Pt called and spoke with front office staff. States she is in urgent care waiting area and is hemorrhaging. Call transferred to clinical staff. Per chart review pt is in [facility] ED. Pt has an ongoing history of DUB. Recently began OCPs on 06/03/20; was to f/u in 3 months. Pt states she began bleeding today. States she has been waiting in the ED for 1 hour. Pt has been seen by a nurse and had lab work done. Pt states she was told she may be having a miscarriage; no pregnancy test results at this time. Pt states she did not have a pad with her when she began bleeding. Is now wearing first pad she has used; states she is also sitting on pads in wheelchair. Pt describes the bleeding as feeling like she is urinating. Pt does not report any other symptoms.  Called ED; spoke with Annie Main, RN. Recommended pt be transferred to MAU for f/u. RN agrees and states he will f/u with pt. Reports called to Anderson Malta, NP at MAU.

## 2020-06-22 ENCOUNTER — Telehealth (INDEPENDENT_AMBULATORY_CARE_PROVIDER_SITE_OTHER): Payer: Medicaid Other

## 2020-06-22 ENCOUNTER — Other Ambulatory Visit: Payer: Self-pay

## 2020-06-22 DIAGNOSIS — R109 Unspecified abdominal pain: Secondary | ICD-10-CM

## 2020-06-22 DIAGNOSIS — N938 Other specified abnormal uterine and vaginal bleeding: Secondary | ICD-10-CM

## 2020-06-22 MED ORDER — TRANEXAMIC ACID 650 MG PO TABS: 1300.0000 mg | ORAL_TABLET | Freq: Three times a day (TID) | ORAL | 0 refills | Status: DC

## 2020-06-22 MED ORDER — CYCLOBENZAPRINE HCL 10 MG PO TABS: 10.0000 mg | ORAL_TABLET | Freq: Three times a day (TID) | ORAL | 0 refills | Status: DC | PRN

## 2020-06-22 NOTE — Progress Notes (Signed)
error 

## 2020-06-22 NOTE — Telephone Encounter (Signed)
Pt called front office and requested to speak with clinical staff regarding continued bleeding. Per chart review pt was seen in MAU by Gilford Rile, CNM; however, pt left before evaluation was complete. Reviewed with Gilford Rile, CNM before calling pt. Walker recommends Megace until pt can be seen in office.  Called pt. Pt reports continued bleeding and pain that feels like a contraction. Pt states Megace did not improve bleeding when she has taken it before. Walker states pt can try Lysteda for bleeding and Flexeril for pain; pt is agreeable. Pt notified to discontinue birth control as taking this with Marshell Levan puts her at an increased risk for side effects such as blood clot. Pt verbalizes understanding. Appt scheduled for 07/08/20 with Elgie Congo, MD.

## 2020-06-26 ENCOUNTER — Ambulatory Visit: Payer: Medicaid Other

## 2020-06-30 ENCOUNTER — Telehealth (INDEPENDENT_AMBULATORY_CARE_PROVIDER_SITE_OTHER): Payer: Medicaid Other

## 2020-06-30 DIAGNOSIS — N938 Other specified abnormal uterine and vaginal bleeding: Secondary | ICD-10-CM

## 2020-07-03 ENCOUNTER — Ambulatory Visit: Payer: Medicaid Other

## 2020-07-03 ENCOUNTER — Telehealth: Payer: Self-pay

## 2020-07-03 NOTE — Telephone Encounter (Signed)
Spoke with family member who reports she has been sick this AM. Advised that her next appt is her last scheduled appt and to call ahead to cancel if she is not able to attend.

## 2020-07-08 ENCOUNTER — Ambulatory Visit (INDEPENDENT_AMBULATORY_CARE_PROVIDER_SITE_OTHER): Payer: Medicaid Other | Admitting: Obstetrics and Gynecology

## 2020-07-08 ENCOUNTER — Encounter: Payer: Self-pay | Admitting: Obstetrics and Gynecology

## 2020-07-08 ENCOUNTER — Other Ambulatory Visit: Payer: Self-pay

## 2020-07-08 ENCOUNTER — Ambulatory Visit: Payer: Medicaid Other

## 2020-07-08 VITALS — BP 142/88 | HR 83 | Wt 264.0 lb

## 2020-07-08 DIAGNOSIS — N938 Other specified abnormal uterine and vaginal bleeding: Secondary | ICD-10-CM

## 2020-07-08 DIAGNOSIS — D250 Submucous leiomyoma of uterus: Secondary | ICD-10-CM

## 2020-07-08 DIAGNOSIS — Z6841 Body Mass Index (BMI) 40.0 and over, adult: Secondary | ICD-10-CM

## 2020-07-08 NOTE — Patient Instructions (Signed)
Uterine Artery Embolization for Fibroids  Uterine artery embolization is a procedure to shrink uterine fibroids. Uterine fibroids are masses of tissue (tumors) that can develop in the womb (uterus). They are also called leiomyomas. This type of tumor is not cancerous (benign) and does not spread to other parts of the body outside of the pelvic area. The pelvic area is the part of the body between the hip bones. You can have one or many fibroids. Fibroids can vary in size, shape, weight, and where they grow in the uterus. Some can become quite large. In this procedure, a thin plastic tube (catheter) is used to inject a chemical that blocks off the blood supply to the fibroid, which causes the fibroid to shrink. Tell a health care provider about:  Any allergies you have.  All medicines you are taking, including vitamins, herbs, eye drops, creams, and over-the-counter medicines.  Any problems you or family members have had with anesthetic medicines.  Any blood disorders you have.  Any surgeries you have had.  Any medical conditions you have.  Whether you are pregnant or may be pregnant. What are the risks? Generally, this is a safe procedure. However, problems may occur, including:  Bleeding.  Allergic reactions to medicines or dyes.  Damage to other structures or organs.  Infection, including blood infection (septicemia).  Injury to the uterus from decreased blood supply.  Lack of menstrual periods (amenorrhea).  Death of tissue cells (necrosis) around your bladder or vulva.  Development of a hole between organs or from an organ to the surface of your skin (fistula).  Blood clot in the legs (deep vein thrombosis) or lung (pulmonary embolus).  Nausea and vomiting. What happens before the procedure? Staying hydrated Follow instructions from your health care provider about hydration, which may include:  Up to 2 hours before the procedure - you may continue to drink clear  liquids, such as water, clear fruit juice, black coffee, and plain tea. Eating and drinking restrictions Follow instructions from your health care provider about eating and drinking, which may include:  8 hours before the procedure - stop eating heavy meals or foods such as meat, fried foods, or fatty foods.  6 hours before the procedure - stop eating light meals or foods, such as toast or cereal.  6 hours before the procedure - stop drinking milk or drinks that contain milk.  2 hours before the procedure - stop drinking clear liquids. Medicines  Ask your health care provider about: ? Changing or stopping your regular medicines. This is especially important if you are taking diabetes medicines or blood thinners. ? Taking over-the-counter medicines, vitamins, herbs, and supplements. ? Taking medicines such as aspirin and ibuprofen. These medicines can thin your blood. Do not take these medicines unless your health care provider tells you to take them.  You may be given antibiotic medicine to help prevent infection.  You may be given medicine to prevent nausea and vomiting (antiemetic). General instructions  Ask your health care provider how your surgical site will be marked or identified.  You may be asked to shower with a germ-killing soap.  Plan to have someone take you home from the hospital or clinic.  If you will be going home right after the procedure, plan to have someone with you for 24 hours.  You will be asked to empty your bladder. What happens during the procedure?  To lower your risk of infection: ? Your health care team will wash or sanitize their hands. ?   Hair may be removed from the surgical area. ? Your skin will be washed with soap.  An IV will be inserted into one of your veins.  You will be given one or more of the following: ? A medicine to help you relax (sedative). ? A medicine to numb the area (local anesthetic).  A small cut (incision) will be made  in your groin.  A catheter will be inserted into the main artery of your leg. The catheter will be guided through the artery to your uterus.  A series of images will be taken while dye is injected through the catheter in your groin. X-rays are taken at the same time. This is done to provide a road map of the blood supply to your uterus and fibroids.  Tiny plastic spheres, about the size of sand grains, will be injected through the catheter. Metal coils may be used to help block the artery. The particles will lodge in tiny branches of the uterine artery that supplies blood to the fibroids.  The procedure will be repeated on the artery that supplies the other side of the uterus.  The catheter will be removed and pressure will be applied to stop the bleeding.  A dressing will be placed over the incision. The procedure may vary among health care providers and hospitals. What happens after the procedure?  Your blood pressure, heart rate, breathing rate, and blood oxygen level will be monitored until the medicines you were given have worn off.  You will be given pain medicine as needed.  You may be given medicine for nausea and vomiting as needed.  Do not drive for 24 hours if you were given a sedative. Summary  Uterine artery embolization is a procedure to shrink uterine fibroids by blocking the blood supply to the fibroid.  You may be given a sedative and local anesthetic for the procedure.  A catheter will be inserted into the main artery of your leg. The catheter will be guided through the artery to your uterus.  After the procedure you will be given pain medicine and medicine for nausea as needed.  Do not drive for 24 hours if you were given a sedative. This information is not intended to replace advice given to you by your health care provider. Make sure you discuss any questions you have with your health care provider. Document Revised: 09/07/2017 Document Reviewed:  12/28/2016 Elsevier Patient Education  2020 Elsevier Inc.  

## 2020-07-08 NOTE — Progress Notes (Signed)
   Subjective:    Patient ID: MURL ZOGG, female    DOB: June 12, 1981, 39 y.o.   MRN: [id number]  HPI  Pt seen for follow up for fibroids and menorrhagia/ irregular bleeding.  Pt has already discontinued her OCP after less than a month.   She states she had a bleeding incident where she had to leave work.  The patient states her sister gave her a pills "for cramps" which got her bleeding to stop.  She does not know what the medication is at this time. Previous ultrasound showed two dominant fibroids.  Pt at this time states she declines major surgery or hysterectomy.  We discussed her options at this time since she has already had an ablation procedure for her uterus.  Discussed progesterone IUD, uterine artery embolization or hysterectomy.  At this time pt will pursue consult with interventional radiology for possible [place].  Of note pt is aware of prediabetes and already has lost 6 pounds.              Assessment & Plan:   1. Class 3 severe obesity due to excess calories without serious comorbidity with body mass index (BMI) of 40.0 to 44.9 in adult [facility])   2. Dysfunctional uterine bleeding Consult for possible uterine artery embolization - Ambulatory referral to Interventional Radiology  3. Submucous leiomyoma of uterus Consult for possible uterine artery embolization - Ambulatory referral to Interventional Radiology  Total face-to-face time with patient:20 minutes. Over 50% of encounter was spent on counseling and coordination of care.   Lynnda Shields, Md

## 2020-07-10 ENCOUNTER — Ambulatory Visit: Payer: Medicaid Other | Attending: Orthopaedic Surgery | Admitting: Physical Therapy

## 2020-07-10 ENCOUNTER — Telehealth: Payer: Self-pay | Admitting: Physical Therapy
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
## 2020-07-12 NOTE — Telephone Encounter (Signed)
Fax received requesting prior authorization for Lysteda. Per chart review, pt has been seen by a provider on 07/08/20. Pt reported at that appt that bleeding has stopped. Pt pursuing treatment with IR for bleeding. Per Elgie Congo, MD's note on 07/08/20, no medication was recommended for bleeding. Will discontinue Lysteda at this time. Called pharmacy and pt with notification. Pt states she has not heard from IR; explained this is not abnormal as it has only been a few days since referral was sent.

## 2020-07-16 ENCOUNTER — Other Ambulatory Visit: Payer: Self-pay | Admitting: Physician Assistant

## 2020-08-22 ENCOUNTER — Other Ambulatory Visit: Payer: Self-pay

## 2020-08-22 ENCOUNTER — Emergency Department (HOSPITAL_COMMUNITY)
Admission: EM | Admit: 2020-08-22 | Discharge: 2020-08-22 | Disposition: A | Discharge status: Home / Self Care | Payer: Medicaid Other | Attending: Emergency Medicine | Admitting: Emergency Medicine

## 2020-08-22 ENCOUNTER — Encounter (HOSPITAL_COMMUNITY): Payer: Self-pay

## 2020-08-22 ENCOUNTER — Emergency Department (HOSPITAL_COMMUNITY): Payer: Medicaid Other

## 2020-08-22 DIAGNOSIS — R29818 Other symptoms and signs involving the nervous system: Secondary | ICD-10-CM | POA: Diagnosis not present

## 2020-08-22 DIAGNOSIS — Z7951 Long term (current) use of inhaled steroids: Secondary | ICD-10-CM | POA: Diagnosis not present

## 2020-08-22 DIAGNOSIS — J449 Chronic obstructive pulmonary disease, unspecified: Secondary | ICD-10-CM | POA: Diagnosis not present

## 2020-08-22 DIAGNOSIS — R490 Dysphonia: Secondary | ICD-10-CM | POA: Diagnosis not present

## 2020-08-22 DIAGNOSIS — R9431 Abnormal electrocardiogram [ECG] [EKG]: Secondary | ICD-10-CM | POA: Diagnosis not present

## 2020-08-22 DIAGNOSIS — R499 Unspecified voice and resonance disorder: Secondary | ICD-10-CM

## 2020-08-22 DIAGNOSIS — R202 Paresthesia of skin: Secondary | ICD-10-CM | POA: Diagnosis not present

## 2020-08-22 LAB — BASIC METABOLIC PANEL
Anion gap: 10 (ref 5–15)
BUN: 6 mg/dL (ref 6–20)
CO2: 26 mmol/L (ref 22–32)
Calcium: 9.1 mg/dL (ref 8.9–10.3)
Chloride: 104 mmol/L (ref 98–111)
Creatinine, Ser: 0.82 mg/dL (ref 0.44–1.00)
GFR, Estimated: >60 mL/min (ref >60)
Glucose, Bld: 103 mg/dL — ABNORMAL HIGH (ref 70–99)
Potassium: 3.3 mmol/L — ABNORMAL LOW (ref 3.5–5.1)
Sodium: 140 mmol/L (ref 135–145)

## 2020-08-22 LAB — CBC
HCT: 32.2 % — ABNORMAL LOW (ref 36.0–46.0)
Hemoglobin: 9.6 g/dL — ABNORMAL LOW (ref 12.0–15.0)
MCH: 24.6 pg — ABNORMAL LOW (ref 26.0–34.0)
MCHC: 29.8 g/dL — ABNORMAL LOW (ref 30.0–36.0)
MCV: 82.4 fL (ref 80.0–100.0)
Platelets: 387 10*3/uL (ref 150–400)
RBC: 3.91 MIL/uL (ref 3.87–5.11)
RDW: 18.2 % — ABNORMAL HIGH (ref 11.5–15.5)
WBC: 9 10*3/uL (ref 4.0–10.5)
nRBC: 0 % (ref 0.0–0.2)

## 2020-08-22 LAB — GROUP A STREP BY PCR: Group A Strep by PCR: NOT DETECTED

## 2020-08-22 MED ORDER — POTASSIUM CHLORIDE CRYS ER 20 MEQ PO TBCR
20.0000 meq | EXTENDED_RELEASE_TABLET | Freq: Once | ORAL | Status: DC
  Filled 2020-08-22: qty 1

## 2020-08-22 MED ORDER — SODIUM CHLORIDE 0.9% FLUSH
3.0000 mL | Freq: Once | INTRAVENOUS | Status: DC
Start: 2020-08-22 — End: 2020-08-23

## 2020-08-22 MED ORDER — POTASSIUM CHLORIDE 20 MEQ/15ML (10%) PO SOLN
20.0000 meq | Freq: Once | ORAL | Status: AC
  Administered 2020-08-22: 20 meq via ORAL
  Filled 2020-08-22 (×2): qty 15

## 2020-08-22 NOTE — ED Provider Notes (Signed)
[facility] EMERGENCY DEPARTMENT Provider Note   CSN: [id number] Arrival date & time: 08/22/20  1351     History No chief complaint on file.   Kada VERNA DESROCHER is a 39 y.o. female.  The history is provided by the patient.  Illness Location:  Unable to speak Onset quality:  Sudden Duration:  8 hours Timing:  Constant Progression:  Unchanged Chronicity:  New Context:  Felt "ill all over" last night, body felt weird and weak, pt had vomiting x1 (non bloody non bilious) Associated symptoms: headaches   Associated symptoms: no abdominal pain, no chest pain, no congestion, no cough, no fever, no nausea, no rash, no shortness of breath, no sore throat and no vomiting        Past Medical History:  Diagnosis Date  . Anemia   . Asthma   . Bursitis of hip   . Cardiomegaly   . Cardiomegaly   . Chondromalacia of hip   . Chondromalacia of right knee   . COPD (chronic obstructive pulmonary disease) ([place])   . GERD (gastroesophageal reflux disease)   . H/O transfusion of packed red blood cells   . Knee pain   . Tinnitus     Patient Active Problem List   Diagnosis Date Noted  . Dysfunctional uterine bleeding 06/03/2020  . Hip pain 08/13/2019  . Insomnia 07/07/2019  . Lumbar radicular pain 07/07/2019  . Uterine leiomyoma 03/12/2018  . Trochanteric bursitis of right hip 03/12/2018  . Class 3 severe obesity due to excess calories without serious comorbidity with body mass index (BMI) of 40.0 to 44.9 in adult ([place]) 03/12/2018  . Iron deficiency anemia due to chronic blood loss 01/29/2018  . Hypokalemia 01/29/2018  . Right sided weakness 01/29/2018    Past Surgical History:  Procedure Laterality Date  . CERVICAL CONIZATION W/BX N/A 06/25/2019   Procedure: CONIZATION CERVIX WITH BIOPSY - COLD KNIFE;  Surgeon: Emily Filbert, MD;  Location: [place];  Service: Gynecology;  Laterality: N/A;  . ENDOMETRIAL ABLATION N/A 06/25/2019   Procedure: MINERVA  ABLATION;  Surgeon: Emily Filbert, MD;  Location: [place];  Service: Gynecology;  Laterality: N/A;  Minerva rep will here confirmed on 06/20/19 CS  . HYSTEROSCOPY WITH D & C  06/25/2019   Procedure: DILATATION AND CURETTAGE /HYSTEROSCOPY;  Surgeon: Emily Filbert, MD;  Location: [facility];  Service: Gynecology;;  . NO PAST SURGERIES       OB History    Gravida  7   Para      Term      Preterm      AB  1   Living  6     SAB  1   TAB      Ectopic      Multiple      Live Births              No family history on file.  Social History   Tobacco Use  . Smoking status: Never Smoker  . Smokeless tobacco: Never Used  Vaping Use  . Vaping Use: Never used  Substance Use Topics  . Alcohol use: Yes    Comment: rare  . Drug use: Yes    Types: Marijuana    Home Medications Prior to Admission medications   Medication Sig Start Date End Date Taking? Authorizing Provider  BLISOVI 24 FE 1-20 MG-MCG(24) tablet Take 1 tablet by mouth daily. 06/29/20   [provider]  cyclobenzaprine (FLEXERIL) 10 MG tablet Take 1 tablet (10 mg total) by mouth every 8 (eight) hours as needed for muscle spasms. 06/22/20   Gaylan Gerold R, CNM  DULoxetine (CYMBALTA) 20 MG capsule TAKE 1 CAPSULE BY MOUTH EVERY DAY 07/16/20   Pete Pelt, PA-C  fluticasone (FLOVENT HFA) 110 MCG/ACT inhaler Inhale into the lungs 2 (two) times daily.    Joline Salt, RN  methylPREDNISolone (MEDROL) 4 MG tablet Take as directed 04/28/20   Pete Pelt, PA-C  naproxen (NAPROSYN) 500 MG tablet Take 1 tablet (500 mg total) by mouth 2 (two) times daily with a meal. 08/18/19   Lavonia Drafts, MD  NON FORMULARY as needed.    Joline Salt, RN    Allergies    Patient has no known allergies.  Review of Systems   Review of Systems  Constitutional: Negative for chills and fever.  HENT: Negative for congestion, drooling, facial swelling, sore throat and trouble  swallowing.        Trouble speaking, able to make some utterances  Eyes: Negative for visual disturbance.  Respiratory: Negative for cough and shortness of breath.   Cardiovascular: Negative for chest pain.  Gastrointestinal: Negative for abdominal pain, nausea and vomiting.  Genitourinary: Negative for difficulty urinating.  Musculoskeletal: Negative for back pain and neck pain.  Skin: Negative for rash and wound.  Neurological: Positive for light-headedness and headaches. Negative for weakness.       No word finding or communication difficulty  All other systems reviewed and are negative.   Physical Exam Updated Vital Signs BP (!) 160/99   Pulse 76   Temp 98.5 F (36.9 C)   Resp 14   SpO2 100%   Physical Exam Vitals reviewed.  Constitutional:      General: She is not in acute distress.    Appearance: Normal appearance. She is obese. She is not ill-appearing or toxic-appearing.  HENT:     Head: Normocephalic and atraumatic.     Nose: Nose normal.     Mouth/Throat:     Lips: Pink.     Mouth: Mucous membranes are moist.     Pharynx: Uvula midline. Oropharyngeal exudate and posterior oropharyngeal erythema present. No uvula swelling.     Tonsils: Tonsillar exudate present. No tonsillar abscesses.  Eyes:     Extraocular Movements: Extraocular movements intact.     Conjunctiva/sclera: Conjunctivae normal.     Pupils: Pupils are equal, round, and reactive to light.  Cardiovascular:     Rate and Rhythm: Normal rate.  Pulmonary:     Effort: Pulmonary effort is normal. No respiratory distress.     Breath sounds: Normal breath sounds. No stridor. No wheezing or rales.  Abdominal:     General: Abdomen is flat.     Palpations: Abdomen is soft.     Tenderness: There is no abdominal tenderness.  Musculoskeletal:     Cervical back: Normal range of motion and neck supple. No tenderness.     Right lower leg: No edema.     Left lower leg: No edema.  Skin:    General: Skin is warm  and dry.     Findings: No rash.  Neurological:     Mental Status: She is alert.     Cranial Nerves: No cranial nerve deficit or facial asymmetry.     Motor: No tremor or abnormal muscle tone.     Coordination: Finger-Nose-Finger Test and Heel to [employer] normal.  Gait: Gait normal.     ED Results / Procedures / Treatments   Labs (all labs ordered are listed, but only abnormal results are displayed) Labs Reviewed  CBC - Abnormal; Notable for the following components:      Result Value   Hemoglobin 9.6 (*)    HCT 32.2 (*)    MCH 24.6 (*)    MCHC 29.8 (*)    RDW 18.2 (*)    All other components within normal limits  BASIC METABOLIC PANEL - Abnormal; Notable for the following components:   Potassium 3.3 (*)    Glucose, Bld 103 (*)    All other components within normal limits  GROUP A STREP BY PCR  I-STAT BETA HCG BLOOD, ED ([facility], [facility], [facility] ONLY)  I-STAT BETA HCG BLOOD, ED ([facility], [facility], [facility] ONLY)  CBG MONITORING, ED    EKG EKG Interpretation  Date/Time:  Sunday August 22 2020 14:21:26 EST Ventricular Rate:  78 PR Interval:  156 QRS Duration: 96 QT Interval:  392 QTC Calculation: 446 R Axis:   39 Text Interpretation: Normal sinus rhythm Nonspecific T wave abnormality Confirmed by Lajean Saver [phone]) on 08/22/2020 6:37:45 PM   Radiology CT HEAD WO CONTRAST  Result Date: 08/22/2020 CLINICAL DATA:  Neuro deficit EXAM: CT HEAD WITHOUT CONTRAST TECHNIQUE: Contiguous axial images were obtained from the base of the skull through the vertex without intravenous contrast. COMPARISON:  January 29, 2018 FINDINGS: Brain: No evidence of acute infarction, hemorrhage, hydrocephalus, extra-axial collection or mass lesion/mass effect. Vascular: No hyperdense vessel or unexpected calcification. Skull: Normal. Negative for fracture or focal lesion. Sinuses/Orbits: No acute finding. Other: Partially empty sella, unchanged. IMPRESSION: No acute intracranial abnormality. Electronically Signed   By:  Valentino Saxon MD   On: 08/22/2020 15:43    Procedures Procedures (including critical care time)  Medications Ordered in ED Medications  sodium chloride flush (NS) 0.9 % injection 3 mL (has no administration in time range)  potassium chloride 20 MEQ/15ML (10%) solution 20 mEq (has no administration in time range)    ED Course  I have reviewed the triage vital signs and the nursing notes.  Pertinent labs & imaging results that were available during my care of the patient were reviewed by me and considered in my medical decision making (see chart for details).    MDM Rules/Calculators/A&P                           Medical Decision Making: Lupie GAYATRI TEASDALE is a 39 y.o. female who presented to the ED today with right facial tingling and unable to speak. Pt reports she was out last night and had sudden "ill feeling" with an episode of vomiting, feeling weak all over. Pt denies significant vomiting, denies caustic ingestion, denies difficulty swallowing or drooling, denies throat swelling or soreness. Pt reports no hx of similar episodes previously, reports she has had "stroke like episodes" before, but has never had a stroke diagnosed formally.   Past medical history significant for GERD, COPD, iron deficiency anemia, dysfunctional uterine bleeding   Pt seen in April 2019 for dizziness and headache with R sided paresthesia and MRI and CT negative for abnormality. Echo/carotid negative. Given Neurology referral.  October 2020 was seen outpt by [facility] neurology for LLE numbness, thought to be due to meralgia paresthetica vs lumbar radiculopathy, however MRI lumbar spine without nerve impingement and EMG normal. Tx with cymbalta and gabapentin.   Reviewed and confirmed nursing  documentation for past medical history, family history, social history.  On my initial exam, the pt was in NAD, no facial or oral swelling, no facial asymmetry, no weakness in extremities, normal EOM, normal gait,  bilateral tonsillar erythema and swelling with R tonsillar exudate.  Pt able to make deep utterances but unable to phonate full words.  Primary communication done through typing on phone.   CT head without acute abnormality.  CBC and BMP unremarkable.   Less likely stroke given pt clinical presentation, head CT negative.  Less likely tumor of vocal cord given acute onset of sx.  Pt denies trauma to neck or chest, no recent surgery or procedures, denies anything unusual that has gone into throat that could have caused injury.  Possibly infectious etiology given feeling ill prior, given enlarged tonsils, strep screen obtained, negative, likely viral. Pt advised to follow up with PCP.  Pt is afebrile, no leukocytosis, not septic appearing today.  Cannot rule out other neurologic condition, atypical flair of MS, advised neurology follow up, pt already established with [facility] neurology.   Given pt reporting hx of multiple neurologic episodes of unclear etiology, will give referal to neurology for further evaluation    Pt advised to follow up with PCP regarding elevated BP today.   Consults: none performed  All radiology and laboratory studies reviewed independently and with my attending physician, agree with reading provided by radiologist unless otherwise noted.   Upon reassessing patient, patient was in NAD, resting comfortably. Able to swallow.  Based on the above findings, I believe patient is hemodynamically stable for discharge.  Patient/and family educated about specific return precautions for given chief complaint and symptoms.  Patient/and family educated about follow-up with PCP and neurology and ENT as needed.  Patient/and family expressed understanding of return precautions and need for follow-up.  Patient discharged.  The above care was discussed with and agreed upon by my attending physician.   Emergency Department Medication Summary:  Medications  sodium chloride flush (NS)  0.9 % injection 3 mL (has no administration in time range)  potassium chloride 20 MEQ/15ML (10%) solution 20 mEq (has no administration in time range)       Final Clinical Impression(s) / ED Diagnoses Final diagnoses:  Paresthesia of skin  Hoarseness or changing voice    Rx / DC Orders ED Discharge Orders    None       Roosevelt Locks, MD 08/22/20 2034    Lajean Saver, MD 08/22/20 2337

## 2020-08-22 NOTE — ED Triage Notes (Signed)
Patient developed cheek and lip tingling yesterday and this am reports unable to speak well due to same, low vocal noted. Grips equal and moves all extremities. Denies trauma

## 2020-08-22 NOTE — Discharge Instructions (Addendum)
 ?  my N [place]:  Thank you for allowing [place] to take care of you today.  We hope you begin feeling better soon.  To-Do: Please follow-up with your primary doctor or call above to schedule an appointment with a new primary care doctor If sx continue, please discuss the need for ENT evaluation with your PCP Follow up with neurology as needed  Please return to the Emergency Department or call 911 if you experience chest pain, shortness of breath, severe pain, severe fever, altered mental status, or have any reason to think that you need emergency medical care.  Thank you again.  Hope you feel better soon.

## 2020-08-23 ENCOUNTER — Telehealth: Payer: Self-pay

## 2020-08-23 NOTE — Telephone Encounter (Signed)
Transition Care Management Follow-up Telephone Call  Date of discharge and from where: 08/22/2020 [facility]   How have you been since you were released from the hospital?Doing better, got her voice back is able to speak more. Going to purchase BP machine later today to start check BP.   Any questions or concerns? No  Items Reviewed:  Did the pt receive and understand the discharge instructions provided? Yes   Medications obtained and verified? No medication given.   Other? Yes   Any new allergies since your discharge? No   Dietary orders reviewed? Yes  Do you have support at home? Yes   Home Care and Equipment/Supplies: Were home health services ordered? not applicable If so, what is the name of the agency? n/a  Has the agency set up a time to come to the patient's home? not applicable Were any new equipment or medical supplies ordered?  No What is the name of the medical supply agency? n/a Were you able to get the supplies/equipment? not applicable Do you have any questions related to the use of the equipment or supplies? No  Functional Questionnaire: (I = Independent and D = Dependent) ADLs: I  Bathing/Dressing- I  Meal Prep- I  Eating- I  Maintaining continence- I  Transferring/Ambulation- I  Managing Meds- I  Follow up appointments reviewed:   PCP Hospital f/u appt confirmed? Yes  Scheduled to see Dionisio David, NP on 06/30/20 @ 9:20am.  [facility] f/u appt confirmed? Yes  Scheduled to see [facility] on 10/11/2020 @ 8:30am. Scheduled to see [facility] 08/24/2020 at 8:20am  Are transportation arrangements needed? No   If their condition worsens, is the pt aware to call PCP or go to the Emergency Dept.? Yes  Was the patient provided with contact information for the PCP's office or ED? Yes  Was to pt encouraged to call back with questions or concerns? Yes  Patient agreed with me calling [facility] for an appointment.  Patient stated she will look in to MyChart for appointment time and dates.

## 2020-08-24 DIAGNOSIS — K219 Gastro-esophageal reflux disease without esophagitis: Secondary | ICD-10-CM | POA: Diagnosis not present

## 2020-08-24 DIAGNOSIS — J3489 Other specified disorders of nose and nasal sinuses: Secondary | ICD-10-CM | POA: Diagnosis not present

## 2020-08-24 DIAGNOSIS — J342 Deviated nasal septum: Secondary | ICD-10-CM | POA: Diagnosis not present

## 2020-08-24 DIAGNOSIS — H93A1 Pulsatile tinnitus, right ear: Secondary | ICD-10-CM | POA: Diagnosis not present

## 2020-08-25 DIAGNOSIS — K219 Gastro-esophageal reflux disease without esophagitis: Secondary | ICD-10-CM | POA: Insufficient documentation

## 2020-08-25 DIAGNOSIS — H93A1 Pulsatile tinnitus, right ear: Secondary | ICD-10-CM | POA: Insufficient documentation

## 2020-08-25 DIAGNOSIS — R490 Dysphonia: Secondary | ICD-10-CM | POA: Insufficient documentation

## 2020-08-30 ENCOUNTER — Other Ambulatory Visit: Payer: Self-pay

## 2020-08-30 ENCOUNTER — Encounter: Payer: Self-pay | Admitting: Nurse Practitioner

## 2020-08-30 ENCOUNTER — Ambulatory Visit (INDEPENDENT_AMBULATORY_CARE_PROVIDER_SITE_OTHER): Payer: Medicaid Other | Admitting: Nurse Practitioner

## 2020-08-30 VITALS — BP 144/86 | HR 92 | Temp 98.1°F | Resp 20 | Ht 64.0 in | Wt 264.8 lb

## 2020-08-30 DIAGNOSIS — G4700 Insomnia, unspecified: Secondary | ICD-10-CM

## 2020-08-30 DIAGNOSIS — F32A Depression, unspecified: Secondary | ICD-10-CM | POA: Diagnosis not present

## 2020-08-30 DIAGNOSIS — Z7689 Persons encountering health services in other specified circumstances: Secondary | ICD-10-CM | POA: Diagnosis not present

## 2020-08-30 DIAGNOSIS — M25551 Pain in right hip: Secondary | ICD-10-CM

## 2020-08-30 DIAGNOSIS — R030 Elevated blood-pressure reading, without diagnosis of hypertension: Secondary | ICD-10-CM | POA: Diagnosis not present

## 2020-08-30 DIAGNOSIS — F419 Anxiety disorder, unspecified: Secondary | ICD-10-CM | POA: Diagnosis not present

## 2020-08-30 MED ORDER — DULOXETINE HCL 40 MG PO CPEP: 1.0000 | ORAL_CAPSULE | Freq: Every day | ORAL | 3 refills | Status: DC

## 2020-08-30 MED ORDER — ZOLPIDEM TARTRATE 5 MG PO TABS: 5.0000 mg | ORAL_TABLET | Freq: Every evening | ORAL | 0 refills | Status: DC | PRN

## 2020-08-30 NOTE — Progress Notes (Signed)
[place],   [address] Phone:  [phone]   Fax:  [phone]   New Patient Office Visit  Subjective:  Patient ID: Nancy Dickerson, female    DOB: 1981-08-20  Age: 39 y.o. MRN: [id number]  CC:  Chief Complaint  Patient presents with  . Establish Care    HPI  Nancy Dickerson is a 39 y.o. female who complains of insomnia. She is here to establish care. She  has a past medical history of Anemia, Asthma, Bursitis of hip, Cardiomegaly, Cardiomegaly, Chondromalacia of hip, Chondromalacia of right knee, COPD (chronic obstructive pulmonary disease) ([place]), GERD (gastroesophageal reflux disease), H/O transfusion of packed red blood cells, Knee pain, and Tinnitus.   Onset was several months ago. Patient describes symptoms as frequent night time awakening, difficulty falling asleep and non-restful sleep. Patient has found minimal relief with melatonin use and prescription sleep aid, Ambien years ago, without side effects. Associated symptoms include: anxiety, depression, snoring, stress and right leg pain. Patient denies restless legs. Symptoms have gradually worsened.  Depression Patient complains of depression. She complains of anhedonia and depressed mood. Onset was approximately several weeks ago. Symptoms have been gradually worsening since that time. Current symptoms include: anhedonia. Patient denies anhedonia, suicidal attempt, suicidal thoughts with specific plan and suicidal thoughts without plan. Family history significant for alcoholism. Possible organic causes contributing are: none. Risk factors: negative life event domestic issues and loss of her father and younger sister this year. . Previous treatment includes medication. She complains of the following side effects from the treatment: question effectiveness. This was started for pain and sleep. . She aslo complains of anxiety related to the above.   Past Medical History:  Diagnosis  Date  . Anemia   . Asthma   . Bursitis of hip   . Cardiomegaly   . Cardiomegaly   . Chondromalacia of hip   . Chondromalacia of right knee   . COPD (chronic obstructive pulmonary disease) ([place])   . GERD (gastroesophageal reflux disease)   . H/O transfusion of packed red blood cells   . Knee pain   . Tinnitus     Past Surgical History:  Procedure Laterality Date  . CERVICAL CONIZATION W/BX N/A 06/25/2019   Procedure: CONIZATION CERVIX WITH BIOPSY - COLD KNIFE;  Surgeon: Emily Filbert, MD;  Location: [facility];  Service: Gynecology;  Laterality: N/A;  . ENDOMETRIAL ABLATION N/A 06/25/2019   Procedure: MINERVA ABLATION;  Surgeon: Emily Filbert, MD;  Location: [facility];  Service: Gynecology;  Laterality: N/A;  Minerva rep will here confirmed on 06/20/19 CS  . HYSTEROSCOPY WITH D & C  06/25/2019   Procedure: DILATATION AND CURETTAGE /HYSTEROSCOPY;  Surgeon: Emily Filbert, MD;  Location: [facility];  Service: Gynecology;;  . NO PAST SURGERIES      History reviewed. No pertinent family history.  Social History   Socioeconomic History  . Marital status: Single    Spouse name: Not on file  . Number of children: 6  . Years of education: 18  . Highest education level: Not on file  Occupational History  . 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 Smoking status: Never Smoker  . Smokeless tobacco: Never Used  Vaping Use  . Vaping Use: Never used  Substance and Sexual Activity  . Alcohol use: Yes    Comment: rare  . Drug use: Yes    Types: Marijuana  .  Sexual activity: Not on file  Other Topics Concern  . Not on file  Social History Narrative   ** Merged History Encounter **         Lives with someone   Right handed    Little caffeine - drinks Sprite   Education - some college          Social Determinants of Health   Financial Resource Strain:   . Difficulty of Paying Living Expenses: Not on file  Food Insecurity: No Food  Insecurity  . Worried About Charity fundraiser in the Last Year: Never true  . Ran Out of Food in the Last Year: Never true  Transportation Needs: No Transportation Needs  . Lack of Transportation (Medical): No  . Lack of Transportation (Non-Medical): No  Physical Activity:   . Days of Exercise per Week: Not on file  . Minutes of Exercise per Session: Not on file  Stress:   . Feeling of Stress : Not on file  Social Connections:   . Frequency of Communication with Friends and Family: Not on file  . Frequency of Social Gatherings with Friends and Family: Not on file  . Attends Religious Services: Not on file  . Active Member of Clubs or Organizations: Not on file  . Attends Archivist Meetings: Not on file  . Marital Status: Not on file  Intimate Partner Violence:   . Fear of Current or Ex-Partner: Not on file  . Emotionally Abused: Not on file  . Physically Abused: Not on file  . Sexually Abused: Not on file    ROS Review of Systems  Objective:   Today's Vitals: BP (!) 144/86 (BP Location: Left Arm, Patient Position: Sitting, Cuff Size: Large)   Pulse 92   Temp 98.1 F (36.7 C)   Resp 20   Ht 5\' 4"  (1.626 m)   Wt 264 lb 12.8 oz (120.1 kg)   SpO2 99%   BMI 45.45 kg/m   Physical Exam Constitutional:      General: She is not in acute distress.    Appearance: She is obese. She is not ill-appearing, toxic-appearing or diaphoretic.  HENT:     Nose: Nose normal.     Mouth/Throat:     Mouth: Mucous membranes are moist.  Cardiovascular:     Rate and Rhythm: Normal rate and regular rhythm.     Pulses: Normal pulses.     Heart sounds: Normal heart sounds.  Pulmonary:     Effort: Pulmonary effort is normal.     Breath sounds: Normal breath sounds.  Abdominal:     General: Bowel sounds are normal.     Palpations: Abdomen is soft.  Musculoskeletal:        General: Tenderness present.     Cervical back: Normal range of motion.     Comments: Right hip  Skin:     General: Skin is warm and dry.     Capillary Refill: Capillary refill takes less than 2 seconds.  Neurological:     General: No focal deficit present.     Mental Status: She is alert.  Psychiatric:        Mood and Affect: Mood normal.        Behavior: Behavior normal.        Thought Content: Thought content normal.        Judgment: Judgment normal.     Assessment & Plan:   Problem List Items Addressed This Visit  Other   Insomnia - Primary Discussed sleep hygiene measures including regular sleep schedule, optimal sleep environment, and relaxing presleep rituals. Recommended daily exercise. Scientist, clinical (histocompatibility and immunogenetics) distributed. Short course of zolpidem (AMBIEN).    Other Visit Diagnoses    Anxiety       Encounter to establish care     Female health maintenance discussed  Including diet and exercise.      Elevated BP without diagnosis of hypertension       Depression, unspecified depression type    Will utilize Duloxetine increased to Duloxetine 40 mg daily   Counseling referral in place 4 week fu or sooner if needed   Relevant Orders   Ambulatory referral to Psychiatry   Right hip pain     Exercises provided      Outpatient Encounter Medications as of 08/30/2020  Medication Sig  . omeprazole (PRILOSEC) 40 MG capsule Take 1 capsule twice daily, 30 minutes before morning and evening meals.  . cyclobenzaprine (FLEXERIL) 10 MG tablet Take 1 tablet (10 mg total) by mouth every 8 (eight) hours as needed for muscle spasms.  . fluticasone (FLOVENT HFA) 110 MCG/ACT inhaler Inhale into the lungs 2 (two) times daily.  . [DISCONTINUED] BLISOVI 24 FE 1-20 MG-MCG(24) tablet Take 1 tablet by mouth daily.  . [DISCONTINUED] DULoxetine (CYMBALTA) 20 MG capsule TAKE 1 CAPSULE BY MOUTH EVERY DAY  . [DISCONTINUED] DULoxetine HCl 40 MG CPEP Take 1 tablet by mouth daily.  . [DISCONTINUED] methylPREDNISolone (MEDROL) 4 MG tablet Take as directed  . [DISCONTINUED] naproxen (NAPROSYN) 500  MG tablet Take 1 tablet (500 mg total) by mouth 2 (two) times daily with a meal.  . [DISCONTINUED] NON FORMULARY as needed.  . [DISCONTINUED] zolpidem (AMBIEN) 5 MG tablet Take 1 tablet (5 mg total) by mouth at bedtime as needed for sleep.   No facility-administered encounter medications on file as of 08/30/2020.    Follow-up: Return in about 4 weeks (around 09/27/2020).   Vevelyn Francois, NP

## 2020-08-30 NOTE — Patient Instructions (Addendum)
Managing Anxiety, Adult After being diagnosed with an anxiety disorder, you may be relieved to know why you have felt or behaved a certain way. You may also feel overwhelmed about the treatment ahead and what it will mean for your life. With care and support, you can manage this condition and recover from it. How to manage lifestyle changes Managing stress and anxiety  Stress is your body's reaction to life changes and events, both good and bad. Most stress will last just a few hours, but stress can be ongoing and can lead to more than just stress. Although stress can play a major role in anxiety, it is not the same as anxiety. Stress is usually caused by something external, such as a deadline, test, or competition. Stress normally passes after the triggering event has ended.  Anxiety is caused by something internal, such as imagining a terrible outcome or worrying that something will go wrong that will devastate you. Anxiety often does not go away even after the triggering event is over, and it can become long-term (chronic) worry. It is important to understand the differences between stress and anxiety and to manage your stress effectively so that it does not lead to an anxious response. Talk with your health care provider or a counselor to learn more about reducing anxiety and stress. He or she may suggest tension reduction techniques, such as:  Music therapy. This can include creating or listening to music that you enjoy and that inspires you.  Mindfulness-based meditation. This involves being aware of your normal breaths while not trying to control your breathing. It can be done while sitting or walking.  Centering prayer. This involves focusing on a word, phrase, or sacred image that means something to you and brings you peace.  Deep breathing. To do this, expand your stomach and inhale slowly through your nose. Hold your breath for 3-5 seconds. Then exhale slowly, letting your stomach muscles  relax.  Self-talk. This involves identifying thought patterns that lead to anxiety reactions and changing those patterns.  Muscle relaxation. This involves tensing muscles and then relaxing them. Choose a tension reduction technique that suits your lifestyle and personality. These techniques take time and practice. Set aside 5-15 minutes a day to do them. Therapists can offer counseling and training in these techniques. The training to help with anxiety may be covered by some insurance plans. Other things you can do to manage stress and anxiety include:  Keeping a stress/anxiety diary. This can help you learn what triggers your reaction and then learn ways to manage your response.  Thinking about how you react to certain situations. You may not be able to control everything, but you can control your response.  Making time for activities that help you relax and not feeling guilty about spending your time in this way.  Visual imagery and yoga can help you stay calm and relax.  Medicines Medicines can help ease symptoms. Medicines for anxiety include:  Anti-anxiety drugs.  Antidepressants. Medicines are often used as a primary treatment for anxiety disorder. Medicines will be prescribed by a health care provider. When used together, medicines, psychotherapy, and tension reduction techniques may be the most effective treatment. Relationships Relationships can play a big part in helping you recover. Try to spend more time connecting with trusted friends and family members. Consider going to couples counseling, taking family education classes, or going to family therapy. Therapy can help you and others better understand your condition. How to recognize changes in your  anxiety Everyone responds differently to treatment for anxiety. Recovery from anxiety happens when symptoms decrease and stop interfering with your daily activities at home or work. This may mean that you will start to:  Have  better concentration and focus. Worry will interfere less in your daily thinking.  Sleep better.  Be less irritable.  Have more energy.  Have improved memory. It is important to recognize when your condition is getting worse. Contact your health care provider if your symptoms interfere with home or work and you feel like your condition is not improving. Follow these instructions at home: Activity  Exercise. Most adults should do the following: ? Exercise for at least 150 minutes each week. The exercise should increase your heart rate and make you sweat (moderate-intensity exercise). ? Strengthening exercises at least twice a week.  Get the right amount and quality of sleep. Most adults need 7-9 hours of sleep each night. Lifestyle   Eat a healthy diet that includes plenty of vegetables, fruits, whole grains, low-fat dairy products, and lean protein. Do not eat a lot of foods that are high in solid fats, added sugars, or salt.  Make choices that simplify your life.  Do not use any products that contain nicotine or tobacco, such as cigarettes, e-cigarettes, and chewing tobacco. If you need help quitting, ask your health care provider.  Avoid caffeine, alcohol, and certain over-the-counter cold medicines. These may make you feel worse. Ask your pharmacist which medicines to avoid. General instructions  Take over-the-counter and prescription medicines only as told by your health care provider.  Keep all follow-up visits as told by your health care provider. This is important. Where to find support You can get help and support from these sources:  Self-help groups.  Online and [employer].  A trusted spiritual leader.  Couples counseling.  Family education classes.  Family therapy. Where to find more information You may find that joining a support group helps you deal with your anxiety. The following sources can help you locate counselors or support groups near  you:  [place]: www.mentalhealthamerica.net  Anxiety and Depression Association of [place] (ADAA): https://www.clark.net/  National Alliance on Mental Illness (NAMI): www.nami.org Contact a health care provider if you:  Have a hard time staying focused or finishing daily tasks.  Spend many hours a day feeling worried about everyday life.  Become exhausted by worry.  Start to have headaches, feel tense, or have nausea.  Urinate more than normal.  Have diarrhea. Get help right away if you have:  A racing heart and shortness of breath.  Thoughts of hurting yourself or others. If you ever feel like you may hurt yourself or others, or have thoughts about taking your own life, get help right away. You can go to your nearest emergency department or call:  Your local emergency services (911 in the [place]  A suicide crisis helpline, such as the [facility] at 639 866 0935. This is open 24 hours a day. Summary  Taking steps to learn and use tension reduction techniques can help calm you and help prevent triggering an anxiety reaction.  When used together, medicines, psychotherapy, and tension reduction techniques may be the most effective treatment.  Family, friends, and partners can play a big part in helping you recover from an anxiety disorder. This information is not intended to replace advice given to you by your health care provider. Make sure you discuss any questions you have with your health care provider. Document Revised:  02/25/2019 Document Reviewed: 02/25/2019 Elsevier Patient Education  [place].  Hip Exercises Ask your health care provider which exercises are safe for you. Do exercises exactly as told by your health care provider and adjust them as directed. It is normal to feel mild stretching, pulling, tightness, or discomfort as you do these exercises. Stop right away if you feel sudden pain or your pain gets worse. Do not  begin these exercises until told by your health care provider. Stretching and range-of-motion exercises These exercises warm up your muscles and joints and improve the movement and flexibility of your hip. These exercises also help to relieve pain, numbness, and tingling. You may be asked to limit your range of motion if you had a hip replacement. Talk to your health care provider about these restrictions. Hamstrings, supine  1. Lie on your back (supine position). 2. Loop a belt or towel over the ball of your left / right foot. The ball of your foot is on the walking surface, right under your toes. 3. Straighten your left / right knee and slowly pull on the belt or towel to raise your leg until you feel a gentle stretch behind your knee (hamstring). ? Do not let your knee bend while you do this. ? Keep your other leg flat on the floor. 4. Hold this position for __________ seconds. 5. Slowly return your leg to the starting position. Repeat __________ times. Complete this exercise __________ times a day. Hip rotation  1. Lie on your back on a firm surface. 2. With your left / right hand, gently pull your left / right knee toward the shoulder that is on the same side of the body. Stop when your knee is pointing toward the ceiling. 3. Hold your left / right ankle with your other hand. 4. Keeping your knee steady, gently pull your left / right ankle toward your other shoulder until you feel a stretch in your buttocks. ? Keep your hips and shoulders firmly planted while you do this stretch. 5. Hold this position for __________ seconds. Repeat __________ times. Complete this exercise __________ times a day. Seated stretch This exercise is sometimes called hamstrings and adductors stretch. 1. Sit on the floor with your legs stretched wide. Keep your knees straight during this exercise. 2. Keeping your head and back in a straight line, bend at your waist to reach for your left foot (position A). You  should feel a stretch in your right inner thigh (adductors). 3. Hold this position for __________ seconds. Then slowly return to the upright position. 4. Keeping your head and back in a straight line, bend at your waist to reach forward (position B). You should feel a stretch behind both of your thighs and knees (hamstrings). 5. Hold this position for __________ seconds. Then slowly return to the upright position. 6. Keeping your head and back in a straight line, bend at your waist to reach for your right foot (position C). You should feel a stretch in your left inner thigh (adductors). 7. Hold this position for __________ seconds. Then slowly return to the upright position. Repeat __________ times. Complete this exercise __________ times a day. Lunge This exercise stretches the muscles of the hip (hip flexors). 1. Place your left / right knee on the floor and bend your other knee so that is directly over your ankle. You should be half-kneeling. 2. Keep good posture with your head over your shoulders. 3. Tighten your buttocks to point your tailbone downward. This  will prevent your back from arching too much. 4. You should feel a gentle stretch in the front of your left / right thigh and hip. If you do not feel a stretch, slide your other foot forward slightly and then slowly lunge forward with your chest up until your knee once again lines up over your ankle. ? Make sure your tailbone continues to point downward. 5. Hold this position for __________ seconds. 6. Slowly return to the starting position. Repeat __________ times. Complete this exercise __________ times a day. Strengthening exercises These exercises build strength and endurance in your hip. Endurance is the ability to use your muscles for a long time, even after they get tired. Bridge This exercise strengthens the muscles of your hip (hip extensors). 1. Lie on your back on a firm surface with your knees bent and your feet flat on the  floor. 2. Tighten your buttocks muscles and lift your bottom off the floor until the trunk of your body and your hips are level with your thighs. ? Do not arch your back. ? You should feel the muscles working in your buttocks and the back of your thighs. If you do not feel these muscles, slide your feet 1-2 inches (2.5-5 cm) farther away from your buttocks. 3. Hold this position for __________ seconds. 4. Slowly lower your hips to the starting position. 5. Let your muscles relax completely between repetitions. Repeat __________ times. Complete this exercise __________ times a day. Straight leg raises, side-lying This exercise strengthens the muscles that move the hip joint away from the center of the body (hip abductors). 1. Lie on your side with your left / right leg in the top position. Lie so your head, shoulder, hip, and knee line up. You may bend your bottom knee slightly to help you balance. 2. Roll your hips slightly forward, so your hips are stacked directly over each other and your left / right knee is facing forward. 3. Leading with your heel, lift your top leg 4-6 inches (10-15 cm). You should feel the muscles in your top hip lifting. ? Do not let your foot drift forward. ? Do not let your knee roll toward the ceiling. 4. Hold this position for __________ seconds. 5. Slowly return to the starting position. 6. Let your muscles relax completely between repetitions. Repeat __________ times. Complete this exercise __________ times a day. Straight leg raises, side-lying This exercise strengthens the muscles that move the hip joint toward the center of the body (hip adductors). 1. Lie on your side with your left / right leg in the bottom position. Lie so your head, shoulder, hip, and knee line up. You may place your upper foot in front to help you balance. 2. Roll your hips slightly forward, so your hips are stacked directly over each other and your left / right knee is facing  forward. 3. Tense the muscles in your inner thigh and lift your bottom leg 4-6 inches (10-15 cm). 4. Hold this position for __________ seconds. 5. Slowly return to the starting position. 6. Let your muscles relax completely between repetitions. Repeat __________ times. Complete this exercise __________ times a day. Straight leg raises, supine This exercise strengthens the muscles in the front of your thigh (quadriceps). 1. Lie on your back (supine position) with your left / right leg extended and your other knee bent. 2. Tense the muscles in the front of your left / right thigh. You should see your kneecap slide up or see increased dimpling  just above your knee. 3. Keep these muscles tight as you raise your leg 4-6 inches (10-15 cm) off the floor. Do not let your knee bend. 4. Hold this position for __________ seconds. 5. Keep these muscles tense as you lower your leg. 6. Relax the muscles slowly and completely between repetitions. Repeat __________ times. Complete this exercise __________ times a day. Hip abductors, standing This exercise strengthens the muscles that move the leg and hip joint away from the center of the body (hip abductors). 1. Tie one end of a rubber exercise band or tubing to a secure surface, such as a chair, table, or pole. 2. Loop the other end of the band or tubing around your left / right ankle. 3. Keeping your ankle with the band or tubing directly opposite the secured end, step away until there is tension in the tubing or band. Hold on to a chair, table, or pole as needed for balance. 4. Lift your left / right leg out to your side. While you do this: ? Keep your back upright. ? Keep your shoulders over your hips. ? Keep your toes pointing forward. ? Make sure to use your hip muscles to slowly lift your leg. Do not tip your body or forcefully lift your leg. 5. Hold this position for __________ seconds. 6. Slowly return to the starting position. Repeat __________  times. Complete this exercise __________ times a day. Squats This exercise strengthens the muscles in the front of your thigh (quadriceps). 1. Stand in a door frame so your feet and knees are in line with the frame. You may place your hands on the frame for balance. 2. Slowly bend your knees and lower your hips like you are going to sit in a chair. ? Keep your lower legs in a straight-up-and-down position. ? Do not let your hips go lower than your knees. ? Do not bend your knees lower than told by your health care provider. ? If your hip pain increases, do not bend as low. 3. Hold this position for ___________ seconds. 4. Slowly push with your legs to return to standing. Do not use your hands to pull yourself to standing. Repeat __________ times. Complete this exercise __________ times a day. This information is not intended to replace advice given to you by your health care provider. Make sure you discuss any questions you have with your health care provider. Document Revised: 05/01/2019 Document Reviewed: 08/06/2018 Elsevier Patient Education  2020 [place] Following a healthy eating pattern may help you to achieve and maintain a healthy body weight, reduce the risk of chronic disease, and live a long and productive life. It is important to follow a healthy eating pattern at an appropriate calorie level for your body. Your nutritional needs should be met primarily through food by choosing a variety of nutrient-rich foods. What are tips for following this plan? Reading food labels  Read labels and choose the following: ? Reduced or low sodium. ? Juices with 100% fruit juice. ? Foods with low saturated fats and high polyunsaturated and monounsaturated fats. ? Foods with whole grains, such as whole wheat, cracked wheat, brown rice, and wild rice. ? Whole grains that are fortified with folic acid. This is recommended for women who are pregnant or who want to become  pregnant.  Read labels and avoid the following: ? Foods with a lot of added sugars. These include foods that contain brown sugar, corn sweetener, corn syrup, dextrose, fructose, glucose,  high-fructose corn syrup, honey, invert sugar, lactose, malt syrup, maltose, molasses, raw sugar, sucrose, trehalose, or turbinado sugar.  Do not eat more than the following amounts of added sugar per day:  6 teaspoons (25 g) for women.  9 teaspoons (38 g) for men. ? Foods that contain processed or refined starches and grains. ? Refined grain products, such as white flour, degermed cornmeal, white bread, and white rice. Shopping  Choose nutrient-rich snacks, such as vegetables, whole fruits, and nuts. Avoid high-calorie and high-sugar snacks, such as potato chips, fruit snacks, and candy.  Use oil-based dressings and spreads on foods instead of solid fats such as butter, stick margarine, or cream cheese.  Limit pre-made sauces, mixes, and "instant" products such as flavored rice, instant noodles, and ready-made pasta.  Try more plant-protein sources, such as tofu, tempeh, black beans, edamame, lentils, nuts, and seeds.  Explore eating plans such as the Mediterranean diet or vegetarian diet. Cooking  Use oil to saut or stir-fry foods instead of solid fats such as butter, stick margarine, or lard.  Try baking, boiling, grilling, or broiling instead of frying.  Remove the fatty part of meats before cooking.  Steam vegetables in water or broth. Meal planning   At meals, imagine dividing your plate into fourths: ? One-half of your plate is fruits and vegetables. ? One-fourth of your plate is whole grains. ? One-fourth of your plate is protein, especially lean meats, poultry, eggs, tofu, beans, or nuts.  Include low-fat dairy as part of your daily diet. Lifestyle  Choose healthy options in all settings, including home, work, school, restaurants, or stores.  Prepare your food safely: ? Wash  your hands after handling raw meats. ? Keep food preparation surfaces clean by regularly washing with hot, soapy water. ? Keep raw meats separate from ready-to-eat foods, such as fruits and vegetables. ? Cook seafood, meat, poultry, and eggs to the recommended internal temperature. ? Store foods at safe temperatures. In general:  Keep cold foods at 11F (4.4C) or below.  Keep hot foods at 111F (60C) or above.  Keep your freezer at [facility] (-17.8C) or below.  Foods are no longer safe to eat when they have been between the temperatures of 40-111F (4.4-60C) for more than 2 hours. What foods should I eat? Fruits Aim to eat 2 cup-equivalents of fresh, canned (in natural juice), or frozen fruits each day. Examples of 1 cup-equivalent of fruit include 1 small apple, 8 large strawberries, 1 cup canned fruit,  cup dried fruit, or 1 cup 100% juice. Vegetables Aim to eat 2-3 cup-equivalents of fresh and frozen vegetables each day, including different varieties and colors. Examples of 1 cup-equivalent of vegetables include 2 medium carrots, 2 cups raw, leafy greens, 1 cup chopped vegetable (raw or cooked), or 1 medium baked potato. Grains Aim to eat 6 ounce-equivalents of whole grains each day. Examples of 1 ounce-equivalent of grains include 1 slice of bread, 1 cup ready-to-eat cereal, 3 cups popcorn, or  cup cooked rice, pasta, or cereal. Meats and other proteins Aim to eat 5-6 ounce-equivalents of protein each day. Examples of 1 ounce-equivalent of protein include 1 egg, 1/2 cup nuts or seeds, or 1 tablespoon (16 g) peanut butter. A cut of meat or fish that is the size of a deck of cards is about 3-4 ounce-equivalents.  Of the protein you eat each week, try to have at least 8 ounces come from seafood. This includes salmon, trout, herring, and anchovies. Dairy Aim to eat 3  cup-equivalents of fat-free or low-fat dairy each day. Examples of 1 cup-equivalent of dairy include 1 cup (240 mL) milk, 8  ounces (250 g) yogurt, 1 ounces (44 g) natural cheese, or 1 cup (240 mL) fortified soy milk. Fats and oils  Aim for about 5 teaspoons (21 g) per day. Choose monounsaturated fats, such as canola and olive oils, avocados, peanut butter, and most nuts, or polyunsaturated fats, such as sunflower, corn, and soybean oils, walnuts, pine nuts, sesame seeds, sunflower seeds, and flaxseed. Beverages  Aim for six 8-oz glasses of water per day. Limit coffee to three to five 8-oz cups per day.  Limit caffeinated beverages that have added calories, such as soda and energy drinks.  Limit alcohol intake to no more than 1 drink a day for nonpregnant women and 2 drinks a day for men. One drink equals 12 oz of beer (355 mL), 5 oz of wine (148 mL), or 1 oz of hard liquor (44 mL). Seasoning and other foods  Avoid adding excess amounts of salt to your foods. Try flavoring foods with herbs and spices instead of salt.  Avoid adding sugar to foods.  Try using oil-based dressings, sauces, and spreads instead of solid fats. This information is based on general [place] nutrition guidelines. For more information, visit [employer]. Exact amounts may vary based on your nutrition needs. Summary  A healthy eating plan may help you to maintain a healthy weight, reduce the risk of chronic diseases, and stay active throughout your life.  Plan your meals. Make sure you eat the right portions of a variety of nutrient-rich foods.  Try baking, boiling, grilling, or broiling instead of frying.  Choose healthy options in all settings, including home, work, school, restaurants, or stores. This information is not intended to replace advice given to you by your health care provider. Make sure you discuss any questions you have with your health care provider. Document Revised: 01/07/2018 Document Reviewed: 01/07/2018 Elsevier Patient Education  [place].

## 2020-08-31 ENCOUNTER — Other Ambulatory Visit: Payer: Self-pay | Admitting: Family Medicine

## 2020-08-31 MED ORDER — ZOLPIDEM TARTRATE 5 MG PO TABS: 5.0000 mg | ORAL_TABLET | Freq: Every evening | ORAL | 0 refills | Status: DC | PRN

## 2020-08-31 MED ORDER — DULOXETINE HCL 40 MG PO CPEP: 1.0000 | ORAL_CAPSULE | Freq: Every day | ORAL | 3 refills | Status: DC

## 2020-09-08 MED FILL — DULoxetine HCL 20 MG CPEP: 20 | 90 days supply | Qty: 180 | Fill #0

## 2020-09-12 ENCOUNTER — Other Ambulatory Visit: Payer: Self-pay | Admitting: Nurse Practitioner

## 2020-09-27 ENCOUNTER — Ambulatory Visit: Payer: Medicaid Other | Admitting: Nurse Practitioner

## 2020-10-11 ENCOUNTER — Ambulatory Visit: Payer: Medicaid Other | Admitting: Neurology

## 2020-10-14 ENCOUNTER — Other Ambulatory Visit: Payer: Self-pay

## 2020-10-14 ENCOUNTER — Other Ambulatory Visit (HOSPITAL_COMMUNITY): Payer: Self-pay | Admitting: Psychiatry

## 2020-10-14 ENCOUNTER — Telehealth (INDEPENDENT_AMBULATORY_CARE_PROVIDER_SITE_OTHER): Payer: No Payment, Other | Admitting: Psychiatry

## 2020-10-14 ENCOUNTER — Encounter (HOSPITAL_COMMUNITY): Payer: Self-pay | Admitting: Psychiatry

## 2020-10-14 DIAGNOSIS — F411 Generalized anxiety disorder: Secondary | ICD-10-CM | POA: Diagnosis not present

## 2020-10-14 DIAGNOSIS — F331 Major depressive disorder, recurrent, moderate: Secondary | ICD-10-CM | POA: Diagnosis not present

## 2020-10-14 MED ORDER — DULOXETINE HCL 30 MG PO CPEP: 30.0000 mg | ORAL_CAPSULE | Freq: Every day | ORAL | 2 refills | Status: DC

## 2020-10-14 MED ORDER — TRAZODONE HCL 50 MG PO TABS: 100.0000 mg | ORAL_TABLET | Freq: Every evening | ORAL | 2 refills | Status: DC | PRN

## 2020-10-14 MED ORDER — HYDROXYZINE HCL 10 MG PO TABS: 10.0000 mg | ORAL_TABLET | Freq: Three times a day (TID) | ORAL | 2 refills | Status: DC | PRN

## 2020-10-14 MED FILL — traZODone HCL 50 MG TABS: 50 | 30 days supply | Qty: 60 | Fill #0

## 2020-10-14 MED FILL — hydrOXYzine HCL 10 MG TABS: 10 | 30 days supply | Qty: 90 | Fill #0

## 2020-10-14 MED FILL — DULoxetine HCL 30 MG CPEP: 30 | 30 days supply | Qty: 30 | Fill #0
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
## 2020-10-15 ENCOUNTER — Encounter: Payer: Self-pay | Admitting: Nurse Practitioner

## 2020-10-15 ENCOUNTER — Other Ambulatory Visit: Payer: Self-pay

## 2020-10-15 ENCOUNTER — Ambulatory Visit (INDEPENDENT_AMBULATORY_CARE_PROVIDER_SITE_OTHER): Payer: Medicaid Other | Admitting: Nurse Practitioner

## 2020-10-15 VITALS — BP 123/81 | HR 95 | Temp 98.0°F | Ht 64.0 in | Wt 263.8 lb

## 2020-10-15 DIAGNOSIS — Z6841 Body Mass Index (BMI) 40.0 and over, adult: Secondary | ICD-10-CM | POA: Diagnosis not present

## 2020-10-15 DIAGNOSIS — R7303 Prediabetes: Secondary | ICD-10-CM

## 2020-10-15 DIAGNOSIS — F331 Major depressive disorder, recurrent, moderate: Secondary | ICD-10-CM | POA: Diagnosis not present

## 2020-10-15 NOTE — Progress Notes (Signed)
Nancy Dickerson, Nancy Dickerson  [id number] Phone:  [phone]   Fax:  [phone]   Established Patient Office Visit  Subjective:  Patient ID: Nancy Dickerson, female    DOB: [birth year]  Age: 40 y.o. MRN: [id number]  CC:  Chief Complaint  Patient presents with  . Follow-up    Follow up , discuss weight management, still having a period for 1 month     HPI Nancy Dickerson presents for follow up. She  has a past medical history of Anemia, Asthma, Bursitis of hip, Cardiomegaly, Cardiomegaly, Chondromalacia of hip, Chondromalacia of right knee, COPD (chronic obstructive pulmonary disease) ([place]), GERD (gastroesophageal reflux disease), H/O transfusion of packed red blood cells, Knee pain, and Tinnitus.   She is eating better. She is trying to exercise some with sit ups and leg stretches. She was not able to start the Cymbalta 40 mg. She does not feel like the Ambien.   She was seen by therapist on yesterday.   She has been trying to move on in her personal/domestic relationships . She is wanting to try to lose weight.   She has had a trying to connect with OBGYN. She was started on OC, this caused more bleeding. She has been trying several different treatment. She has been on iron infusions in the past.   Past Medical History:  Diagnosis Date  . Anemia   . Asthma   . Bursitis of hip   . Cardiomegaly   . Cardiomegaly   . Chondromalacia of hip   . Chondromalacia of right knee   . COPD (chronic obstructive pulmonary disease) ([place])   . GERD (gastroesophageal reflux disease)   . H/O transfusion of packed red blood cells   . Knee pain   . Tinnitus     Past Surgical History:  Procedure Laterality Date  . CERVICAL CONIZATION W/BX N/A 06/25/2019   Procedure: CONIZATION CERVIX WITH BIOPSY - COLD KNIFE;  Surgeon: Emily Filbert, MD;  Location: [place];  Service: Gynecology;  Laterality: N/A;  . ENDOMETRIAL ABLATION N/A 06/25/2019    Procedure: MINERVA ABLATION;  Surgeon: Emily Filbert, MD;  Location: [place];  Service: Gynecology;  Laterality: N/A;  Minerva rep will here confirmed on 06/20/19 CS  . HYSTEROSCOPY WITH D & C  06/25/2019   Procedure: DILATATION AND CURETTAGE /HYSTEROSCOPY;  Surgeon: Emily Filbert, MD;  Location: [place];  Service: Gynecology;;  . NO PAST SURGERIES      History reviewed. No pertinent family history.  Social History   Socioeconomic History  . Marital status: Single    Spouse name: Not on file  . Number of children: 6  . Years of education: 13  . Highest education level: Not on file  Occupational History  . 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 Smoking status: Never Smoker  . Smokeless tobacco: Never Used  Vaping Use  . Vaping Use: Never used  Substance and Sexual Activity  . Alcohol use: Yes    Comment: rare  . Drug use: Yes    Types: Marijuana  . Sexual activity: Not on file  Other Topics Concern  . Not on file  Social History Narrative   ** Merged History Encounter **         Lives with someone   Right handed    Little caffeine - drinks Sprite   Education - some college  Social Determinants of Health   Financial Resource Strain: Not on file  Food Insecurity: No Food Insecurity  . Worried About Charity fundraiser in the Last Year: Never true  . Ran Out of Food in the Last Year: Never true  Transportation Needs: No Transportation Needs  . Lack of Transportation (Medical): No  . Lack of Transportation (Non-Medical): No  Physical Activity: Not on file  Stress: Not on file  Social Connections: Not on file  Intimate Partner Violence: Not on file    Outpatient Medications Prior to Visit  Medication Sig Dispense Refill  . DULoxetine (CYMBALTA) 30 MG capsule Take 1 capsule (30 mg total) by mouth daily. 30 capsule 2  . fluticasone (FLOVENT HFA) 110 MCG/ACT inhaler Inhale into the lungs 2 (two) times daily.    . hydrOXYzine  (ATARAX/VISTARIL) 10 MG tablet Take 1 tablet (10 mg total) by mouth 3 (three) times daily as needed. 90 tablet 2  . omeprazole (PRILOSEC) 40 MG capsule Take 1 capsule twice daily, 30 minutes before morning and evening meals.    . traZODone (DESYREL) 50 MG tablet Take 2 tablets (100 mg total) by mouth at bedtime as needed for sleep. 60 tablet 2  . cyclobenzaprine (FLEXERIL) 10 MG tablet Take 1 tablet (10 mg total) by mouth every 8 (eight) hours as needed for muscle spasms. (Patient not taking: Reported on 10/15/2020) 30 tablet 0  . zolpidem (AMBIEN) 5 MG tablet Take 1 tablet (5 mg total) by mouth at bedtime as needed for sleep. 30 tablet 0   No facility-administered medications prior to visit.    No Known Allergies  ROS Review of Systems    Objective:    Physical Exam  BP 123/81   Pulse 95   Temp 98 F (36.7 C) (Temporal)   Ht 5\' 4"  (1.626 m)   Wt 263 lb 12.8 oz (119.7 kg)   BMI 45.28 kg/m  Wt Readings from Last 3 Encounters:  10/15/20 263 lb 12.8 oz (119.7 kg)  08/30/20 264 lb 12.8 oz (120.1 kg)  07/08/20 264 lb (119.7 kg)     There are no preventive care reminders to display for this patient.  There are no preventive care reminders to display for this patient.  Lab Results  Component Value Date   TSH 0.768 07/08/2018   Lab Results  Component Value Date   WBC 9.0 08/22/2020   HGB 9.6 (L) 08/22/2020   HCT 32.2 (L) 08/22/2020   MCV 82.4 08/22/2020   PLT 387 08/22/2020   Lab Results  Component Value Date   NA 140 08/22/2020   K 3.3 (L) 08/22/2020   CO2 26 08/22/2020   GLUCOSE 103 (H) 08/22/2020   BUN 6 08/22/2020   CREATININE 0.82 08/22/2020   BILITOT 0.6 05/13/2019   ALKPHOS 46 05/13/2019   AST 19 05/13/2019   ALT 21 05/13/2019   PROT 7.6 05/13/2019   ALBUMIN 4.0 05/13/2019   CALCIUM 9.1 08/22/2020   ANIONGAP 10 08/22/2020   Lab Results  Component Value Date   CHOL 115 01/30/2018   Lab Results  Component Value Date   HDL 39 (L) 01/30/2018   Lab  Results  Component Value Date   LDLCALC 58 01/30/2018   Lab Results  Component Value Date   TRIG 89 01/30/2018   Lab Results  Component Value Date   CHOLHDL 2.9 01/30/2018   Lab Results  Component Value Date   HGBA1C 5.7 (H) 06/03/2020      Assessment &  Plan:   Problem List Items Addressed This Visit      Other   Moderate episode of recurrent major depressive disorder ([place]) Continue to follow up with psychiatrist   Class 3 severe obesity due to excess calories without serious comorbidity with body mass index (BMI) of 40.0 to 44.9 in adult [facility]) Referral to weight management     Other Visit Diagnoses    Prediabetes    -  Primary Consider home glucose monitoring Weight loss at least 5% of current body weight is can be achieved with lifestyle modification dietary changes and regular daily exercise Encourage blood pressure control goal <120/80 and maintaining total cholesterol <200 Follow-up every 3 to 6 months for reevaluation       No orders of the defined types were placed in this encounter.   Follow-up: Return in about 6 weeks (around 11/26/2020) for [facility] management [id number].    Vevelyn Francois, NP

## 2020-10-15 NOTE — Patient Instructions (Signed)
Calorie Counting for Weight Loss Calories are units of energy. Your body needs a certain amount of calories from food to keep you going throughout the day. When you eat more calories than your body needs, your body stores the extra calories as fat. When you eat fewer calories than your body needs, your body burns fat to get the energy it needs. Calorie counting means keeping track of how many calories you eat and drink each day. Calorie counting can be helpful if you need to lose weight. If you make sure to eat fewer calories than your body needs, you should lose weight. Ask your health care provider what a healthy weight is for you. For calorie counting to work, you will need to eat the right number of calories in a day in order to lose a healthy amount of weight per week. A dietitian can help you determine how many calories you need in a day and will give you suggestions on how to reach your calorie goal.  A healthy amount of weight to lose per week is usually 1-2 lb (0.5-0.9 kg). This usually means that your daily calorie intake should be reduced by 500-750 calories.  Eating 1,200 - 1,500 calories per day can help most women lose weight.  Eating 1,500 - 1,800 calories per day can help most men lose weight. What is my plan? My goal is to have __________ calories per day. If I have this many calories per day, I should lose around __________ pounds per week. What do I need to know about calorie counting? In order to meet your daily calorie goal, you will need to:  Find out how many calories are in each food you would like to eat. Try to do this before you eat.  Decide how much of the food you plan to eat.  Write down what you ate and how many calories it had. Doing this is called keeping a food log. To successfully lose weight, it is important to balance calorie counting with a healthy lifestyle that includes regular activity. Aim for 150 minutes of moderate exercise (such as walking) or 75  minutes of vigorous exercise (such as running) each week. Where do I find calorie information?  The number of calories in a food can be found on a Nutrition Facts label. If a food does not have a Nutrition Facts label, try to look up the calories online or ask your dietitian for help. Remember that calories are listed per serving. If you choose to have more than one serving of a food, you will have to multiply the calories per serving by the amount of servings you plan to eat. For example, the label on a package of bread might say that a serving size is 1 slice and that there are 90 calories in a serving. If you eat 1 slice, you will have eaten 90 calories. If you eat 2 slices, you will have eaten 180 calories. How do I keep a food log? Immediately after each meal, record the following information in your food log:  What you ate. Don't forget to include toppings, sauces, and other extras on the food.  How much you ate. This can be measured in cups, ounces, or number of items.  How many calories each food and drink had.  The total number of calories in the meal. Keep your food log near you, such as in a small notebook in your pocket, or use a mobile app or website. Some programs will calculate   calories for you and show you how many calories you have left for the day to meet your goal. What are some calorie counting tips?   Use your calories on foods and drinks that will fill you up and not leave you hungry: ? Some examples of foods that fill you up are nuts and nut butters, vegetables, lean proteins, and high-fiber foods like whole grains. High-fiber foods are foods with more than 5 g fiber per serving. ? Drinks such as sodas, specialty coffee drinks, alcohol, and juices have a lot of calories, yet do not fill you up.  Eat nutritious foods and avoid empty calories. Empty calories are calories you get from foods or beverages that do not have many vitamins or protein, such as candy, sweets, and  soda. It is better to have a nutritious high-calorie food (such as an avocado) than a food with few nutrients (such as a bag of chips).  Know how many calories are in the foods you eat most often. This will help you calculate calorie counts faster.  Pay attention to calories in drinks. Low-calorie drinks include water and unsweetened drinks.  Pay attention to nutrition labels for "low fat" or "fat free" foods. These foods sometimes have the same amount of calories or more calories than the full fat versions. They also often have added sugar, starch, or salt, to make up for flavor that was removed with the fat.  Find a way of tracking calories that works for you. Get creative. Try different apps or programs if writing down calories does not work for you. What are some portion control tips?  Know how many calories are in a serving. This will help you know how many servings of a certain food you can have.  Use a measuring cup to measure serving sizes. You could also try weighing out portions on a kitchen scale. With time, you will be able to estimate serving sizes for some foods.  Take some time to put servings of different foods on your favorite plates, bowls, and cups so you know what a serving looks like.  Try not to eat straight from a bag or box. Doing this can lead to overeating. Put the amount you would like to eat in a cup or on a plate to make sure you are eating the right portion.  Use smaller plates, glasses, and bowls to prevent overeating.  Try not to multitask (for example, watch TV or use your computer) while eating. If it is time to eat, sit down at a table and enjoy your food. This will help you to know when you are full. It will also help you to be aware of what you are eating and how much you are eating. What are tips for following this plan? Reading food labels  Check the calorie count compared to the serving size. The serving size may be smaller than what you are used to  eating.  Check the source of the calories. Make sure the food you are eating is high in vitamins and protein and low in saturated and trans fats. Shopping  Read nutrition labels while you shop. This will help you make healthy decisions before you decide to purchase your food.  Make a grocery list and stick to it. Cooking  Try to cook your favorite foods in a healthier way. For example, try baking instead of frying.  Use low-fat dairy products. Meal planning  Use more fruits and vegetables. Half of your plate should be fruits   and vegetables.  Include lean proteins like poultry and fish. How do I count calories when eating out?  Ask for smaller portion sizes.  Consider sharing an entree and sides instead of getting your own entree.  If you get your own entree, eat only half. Ask for a box at the beginning of your meal and put the rest of your entree in it so you are not tempted to eat it.  If calories are listed on the menu, choose the lower calorie options.  Choose dishes that include vegetables, fruits, whole grains, low-fat dairy products, and lean protein.  Choose items that are boiled, broiled, grilled, or steamed. Stay away from items that are buttered, battered, fried, or served with cream sauce. Items labeled "crispy" are usually fried, unless stated otherwise.  Choose water, low-fat milk, unsweetened iced tea, or other drinks without added sugar. If you want an alcoholic beverage, choose a lower calorie option such as a glass of wine or light beer.  Ask for dressings, sauces, and syrups on the side. These are usually high in calories, so you should limit the amount you eat.  If you want a salad, choose a garden salad and ask for grilled meats. Avoid extra toppings like bacon, cheese, or fried items. Ask for the dressing on the side, or ask for olive oil and vinegar or lemon to use as dressing.  Estimate how many servings of a food you are given. For example, a serving of  cooked rice is  cup or about the size of half a baseball. Knowing serving sizes will help you be aware of how much food you are eating at restaurants. The list below tells you how big or small some common portion sizes are based on everyday objects: ? 1 oz--4 stacked dice. ? 3 oz--1 deck of cards. ? 1 tsp--1 die. ? 1 Tbsp-- a ping-pong ball. ? 2 Tbsp--1 ping-pong ball. ?  cup-- baseball. ? 1 cup--1 baseball. Summary  Calorie counting means keeping track of how many calories you eat and drink each day. If you eat fewer calories than your body needs, you should lose weight.  A healthy amount of weight to lose per week is usually 1-2 lb (0.5-0.9 kg). This usually means reducing your daily calorie intake by 500-750 calories.  The number of calories in a food can be found on a Nutrition Facts label. If a food does not have a Nutrition Facts label, try to look up the calories online or ask your dietitian for help.  Use your calories on foods and drinks that will fill you up, and not on foods and drinks that will leave you hungry.  Use smaller plates, glasses, and bowls to prevent overeating. This information is not intended to replace advice given to you by your health care provider. Make sure you discuss any questions you have with your health care provider. Document Revised: 06/14/2018 Document Reviewed: 08/25/2016 Elsevier Patient Education  2020 [place].    Prediabetes Eating Plan Prediabetes is a condition that causes blood sugar (glucose) levels to be higher than normal. This increases the risk for developing diabetes. In order to prevent diabetes from developing, your health care provider may recommend a diet and other lifestyle changes to help you:  Control your blood glucose levels.  Improve your cholesterol levels.  Manage your blood pressure. Your health care provider may recommend working with a diet and nutrition specialist (dietitian) to make a meal plan that is  best for you. What  are tips for following this plan? Lifestyle  Set weight loss goals with the help of your health care team. It is recommended that most people with prediabetes lose 7% of their current body weight.  Exercise for at least 30 minutes at least 5 days a week.  Attend a support group or seek ongoing support from a mental health counselor.  Take over-the-counter and prescription medicines only as told by your health care provider. Reading food labels  Read food labels to check the amount of fat, salt (sodium), and sugar in prepackaged foods. Avoid foods that have: ? Saturated fats. ? Trans fats. ? Added sugars.  Avoid foods that have more than 300 milligrams (mg) of sodium per serving. Limit your daily sodium intake to less than 2,300 mg each day. Shopping  Avoid buying pre-made and processed foods. Cooking  Cook with olive oil. Do not use butter, lard, or ghee.  Bake, broil, grill, or boil foods. Avoid frying. Meal planning   Work with your dietitian to develop an eating plan that is right for you. This may include: ? Tracking how many calories you take in. Use a food diary, notebook, or mobile application to track what you eat at each meal. ? Using the glycemic index (GI) to plan your meals. The index tells you how quickly a food will raise your blood glucose. Choose low-GI foods. These foods take a longer time to raise blood glucose.  Consider following a Mediterranean diet. This diet includes: ? Several servings each day of fresh fruits and vegetables. ? Eating fish at least twice a week. ? Several servings each day of whole grains, beans, nuts, and seeds. ? Using olive oil instead of other fats. ? Moderate alcohol consumption. ? Eating small amounts of red meat and whole-fat dairy.  If you have high blood pressure, you may need to limit your sodium intake or follow a diet such as the DASH eating plan. DASH is an eating plan that aims to lower high blood  pressure. What foods are recommended? The items listed below may not be a complete list. Talk with your dietitian about what dietary choices are best for you. Grains Whole grains, such as whole-wheat or whole-grain breads, crackers, cereals, and pasta. Unsweetened oatmeal. Bulgur. Barley. Quinoa. Brown rice. Corn or whole-wheat flour tortillas or taco shells. Vegetables Lettuce. Spinach. Peas. Beets. Cauliflower. Cabbage. Broccoli. Carrots. Tomatoes. Squash. Eggplant. Herbs. Peppers. Onions. Cucumbers. Brussels sprouts. Fruits Berries. Bananas. Apples. Oranges. Grapes. Papaya. Mango. Pomegranate. Kiwi. Grapefruit. Cherries. Meats and other protein foods Seafood. Poultry without skin. Lean cuts of pork and beef. Tofu. Eggs. Nuts. Beans. Dairy Low-fat or fat-free dairy products, such as yogurt, cottage cheese, and cheese. Beverages Water. Tea. Coffee. Sugar-free or diet soda. Seltzer water. Lowfat or no-fat milk. Milk alternatives, such as soy or almond milk. Fats and oils Olive oil. Canola oil. Sunflower oil. Grapeseed oil. Avocado. Walnuts. Sweets and desserts Sugar-free or low-fat pudding. Sugar-free or low-fat ice cream and other frozen treats. Seasoning and other foods Herbs. Sodium-free spices. Mustard. Relish. Low-fat, low-sugar ketchup. Low-fat, low-sugar barbecue sauce. Low-fat or fat-free mayonnaise. What foods are not recommended? The items listed below may not be a complete list. Talk with your dietitian about what dietary choices are best for you. Grains Refined white flour and flour products, such as bread, pasta, snack foods, and cereals. Vegetables Canned vegetables. Frozen vegetables with butter or cream sauce. Fruits Fruits canned with syrup. Meats and other protein foods Fatty cuts of meat. Poultry with  skin. Breaded or fried meat. Processed meats. Dairy Full-fat yogurt, cheese, or milk. Beverages Sweetened drinks, such as sweet iced tea and soda. Fats and  oils Butter. Lard. Ghee. Sweets and desserts Baked goods, such as cake, cupcakes, pastries, cookies, and cheesecake. Seasoning and other foods Spice mixes with added salt. Ketchup. Barbecue sauce. Mayonnaise. Summary  To prevent diabetes from developing, you may need to make diet and other lifestyle changes to help control blood sugar, improve cholesterol levels, and manage your blood pressure.  Set weight loss goals with the help of your health care team. It is recommended that most people with prediabetes lose 7 percent of their current body weight.  Consider following a Mediterranean diet that includes plenty of fresh fruits and vegetables, whole grains, beans, nuts, seeds, fish, lean meat, low-fat dairy, and healthy oils. This information is not intended to replace advice given to you by your health care provider. Make sure you discuss any questions you have with your health care provider. Document Revised: 01/17/2019 Document Reviewed: 11/29/2016 Elsevier Patient Education  2020 [employer].

## 2020-10-27 ENCOUNTER — Encounter (HOSPITAL_COMMUNITY): Payer: Self-pay

## 2020-10-27 ENCOUNTER — Ambulatory Visit (HOSPITAL_COMMUNITY)
Admission: EM | Admit: 2020-10-27 | Discharge: 2020-10-27 | Disposition: A | Discharge status: Other Acute Inpatient Hospital | Payer: Medicaid Other | Attending: Family Medicine | Admitting: Family Medicine

## 2020-10-27 ENCOUNTER — Emergency Department (HOSPITAL_COMMUNITY)
Admission: EM | Admit: 2020-10-27 | Discharge: 2020-10-28 | Disposition: A | Discharge status: Home / Self Care | Payer: Medicaid Other | Attending: Emergency Medicine | Admitting: Emergency Medicine

## 2020-10-27 ENCOUNTER — Emergency Department (HOSPITAL_COMMUNITY): Payer: Medicaid Other

## 2020-10-27 ENCOUNTER — Other Ambulatory Visit: Payer: Self-pay

## 2020-10-27 DIAGNOSIS — U071 COVID-19: Secondary | ICD-10-CM | POA: Diagnosis not present

## 2020-10-27 DIAGNOSIS — R0602 Shortness of breath: Secondary | ICD-10-CM | POA: Diagnosis present

## 2020-10-27 DIAGNOSIS — Z209 Contact with and (suspected) exposure to unspecified communicable disease: Secondary | ICD-10-CM | POA: Diagnosis not present

## 2020-10-27 DIAGNOSIS — J45909 Unspecified asthma, uncomplicated: Secondary | ICD-10-CM | POA: Diagnosis not present

## 2020-10-27 DIAGNOSIS — Z7951 Long term (current) use of inhaled steroids: Secondary | ICD-10-CM | POA: Insufficient documentation

## 2020-10-27 DIAGNOSIS — R509 Fever, unspecified: Secondary | ICD-10-CM | POA: Diagnosis present

## 2020-10-27 DIAGNOSIS — J449 Chronic obstructive pulmonary disease, unspecified: Secondary | ICD-10-CM | POA: Diagnosis not present

## 2020-10-27 DIAGNOSIS — R000 Tachycardia, unspecified: Secondary | ICD-10-CM | POA: Diagnosis not present

## 2020-10-27 DIAGNOSIS — R079 Chest pain, unspecified: Secondary | ICD-10-CM | POA: Diagnosis not present

## 2020-10-27 LAB — BASIC METABOLIC PANEL
Anion gap: 11 (ref 5–15)
BUN: 8 mg/dL (ref 6–20)
CO2: 24 mmol/L (ref 22–32)
Calcium: 9.4 mg/dL (ref 8.9–10.3)
Chloride: 100 mmol/L (ref 98–111)
Creatinine, Ser: 0.83 mg/dL (ref 0.44–1.00)
GFR, Estimated: >60 mL/min (ref >60)
Glucose, Bld: 94 mg/dL (ref 70–99)
Potassium: 3.6 mmol/L (ref 3.5–5.1)
Sodium: 135 mmol/L (ref 135–145)

## 2020-10-27 LAB — CBC
HCT: 30.2 % — ABNORMAL LOW (ref 36.0–46.0)
Hemoglobin: 9.7 g/dL — ABNORMAL LOW (ref 12.0–15.0)
MCH: 26.2 pg (ref 26.0–34.0)
MCHC: 32.1 g/dL (ref 30.0–36.0)
MCV: 81.6 fL (ref 80.0–100.0)
Platelets: 392 10*3/uL (ref 150–400)
RBC: 3.7 MIL/uL — ABNORMAL LOW (ref 3.87–5.11)
RDW: 18.5 % — ABNORMAL HIGH (ref 11.5–15.5)
WBC: 8.2 10*3/uL (ref 4.0–10.5)
nRBC: 0 % (ref 0.0–0.2)

## 2020-10-27 LAB — I-STAT BETA HCG BLOOD, ED (MC, WL, AP ONLY): I-stat hCG, quantitative: 9 m[IU]/mL — ABNORMAL HIGH (ref <5)

## 2020-10-27 LAB — TROPONIN I (HIGH SENSITIVITY): Troponin I (High Sensitivity): 6 ng/L (ref <18)

## 2020-10-27 MED ORDER — ACETAMINOPHEN 325 MG PO TABS
ORAL_TABLET | ORAL | Status: AC
  Filled 2020-10-27: qty 3

## 2020-10-27 MED ORDER — ACETAMINOPHEN 325 MG PO TABS
975.0000 mg | ORAL_TABLET | Freq: Once | ORAL | Status: AC
  Administered 2020-10-27: 975 mg via ORAL

## 2020-10-27 NOTE — ED Notes (Signed)
Patient is being discharged from the Urgent Care and sent to the Emergency Department via EMS . Per DR. Hagler, patient is in need of higher level of care due to chest pain, SOB and tachycardia. Patient is aware and verbalizes understanding of plan of care.  Vitals:   10/27/20 1613  BP: 138/78  Pulse: (!) 144  Resp: 17  Temp: (!) 102.8 F (39.3 C)  SpO2: 98%

## 2020-10-27 NOTE — ED Triage Notes (Signed)
Patient sent from urgent care, presented for fever, chills, body aches, nausea, headache, was given tylenol PTA, sent for tachycardia.

## 2020-10-27 NOTE — ED Notes (Addendum)
Report called to ED "Roselyn Reef".

## 2020-10-27 NOTE — ED Triage Notes (Signed)
Pt fever, chills, body aches, nausea, headaches X 1 day. Pt states she has not taken any OTC medicine for her body aches. Pt states she has been having chest pain when inhaling. Pt denies coughing. Pt states she feels dizzy.

## 2020-10-28 LAB — SARS CORONAVIRUS 2 (TAT 6-24 HRS): SARS Coronavirus 2: POSITIVE — AB

## 2020-10-28 NOTE — ED Provider Notes (Signed)
[facility] EMERGENCY DEPARTMENT Provider Note   CSN: [id number] Arrival date & time: 10/27/20  1726     History Chief Complaint  Patient presents with  . Chest Pain    Nancy Dickerson is a 40 y.o. female.  HPI   Patient presents to the emergency room for evaluation of fever and tachycardia.  Patient states she suddenly started having symptoms yesterday where she had body aches nausea and headache.  She also had some chest discomfort with inhaling.  She has not been having coughing.  She has not had any urinary discomfort.  Patient went to an urgent care yesterday.  She was found to have a heart rate of 144 with a temperature of 102.8.  She was sent to the ED.  While the patient was waiting for evaluation in the ED her heart rate has decreased.  Patient has not been vaccinated for COVID.  She did previously have COVID.  Patient states she did not feel too bad the first time she had covid  Past Medical History:  Diagnosis Date  . Anemia   . Asthma   . Bursitis of hip   . Cardiomegaly   . Cardiomegaly   . Chondromalacia of hip   . Chondromalacia of right knee   . COPD (chronic obstructive pulmonary disease) ([place])   . GERD (gastroesophageal reflux disease)   . H/O transfusion of packed red blood cells   . Knee pain   . Tinnitus     Patient Active Problem List   Diagnosis Date Noted  . Moderate episode of recurrent major depressive disorder ([place]) 10/14/2020  . Generalized anxiety disorder 10/14/2020  . Dysphonia 08/25/2020  . Laryngopharyngeal reflux (LPR) 08/25/2020  . Pulsatile tinnitus of right ear 08/25/2020  . Dysfunctional uterine bleeding 06/03/2020  . Patellofemoral pain syndrome of left knee 11/08/2019  . Acute pain of left knee 08/13/2019  . Insomnia 07/07/2019  . Lumbar radicular pain 07/07/2019  . Uterine leiomyoma 03/12/2018  . Trochanteric bursitis of right hip 03/12/2018  . Class 3 severe obesity due to excess calories without serious  comorbidity with body mass index (BMI) of 40.0 to 44.9 in adult ([place]) 03/12/2018  . Iron deficiency anemia due to chronic blood loss 01/29/2018  . Hypokalemia 01/29/2018  . Right sided weakness 01/29/2018    Past Surgical History:  Procedure Laterality Date  . CERVICAL CONIZATION W/BX N/A 06/25/2019   Procedure: CONIZATION CERVIX WITH BIOPSY - COLD KNIFE;  Surgeon: Emily Filbert, MD;  Location: [facility];  Service: Gynecology;  Laterality: N/A;  . ENDOMETRIAL ABLATION N/A 06/25/2019   Procedure: MINERVA ABLATION;  Surgeon: Emily Filbert, MD;  Location: [facility];  Service: Gynecology;  Laterality: N/A;  [employer] rep will here confirmed on 06/20/19 CS  . HYSTEROSCOPY WITH D & C  06/25/2019   Procedure: DILATATION AND CURETTAGE /HYSTEROSCOPY;  Surgeon: Emily Filbert, MD;  Location: [facility];  Service: Gynecology;;  . NO PAST SURGERIES       OB History    Gravida  7   Para      Term      Preterm      AB  1   Living  6     SAB  1   IAB      Ectopic      Multiple      Live Births              History  reviewed. No pertinent family history.  Social History   Tobacco Use  . Smoking status: Never Smoker  . Smokeless tobacco: Never Used  Vaping Use  . Vaping Use: Never used  Substance Use Topics  . Alcohol use: Yes    Comment: rare  . Drug use: Yes    Types: Marijuana    Home Medications Prior to Admission medications   Medication Sig Start Date End Date Taking? Authorizing Provider  cyclobenzaprine (FLEXERIL) 10 MG tablet Take 1 tablet (10 mg total) by mouth every 8 (eight) hours as needed for muscle spasms. Patient not taking: Reported on 10/15/2020 06/22/20   Gabriel Carina, CNM  DULoxetine (CYMBALTA) 30 MG capsule Take 1 capsule (30 mg total) by mouth daily. 10/14/20   Salley Slaughter, NP  fluticasone (FLOVENT HFA) 110 MCG/ACT inhaler Inhale into the lungs 2 (two) times daily.    Joline Salt, RN   hydrOXYzine (ATARAX/VISTARIL) 10 MG tablet Take 1 tablet (10 mg total) by mouth 3 (three) times daily as needed. 10/14/20   Salley Slaughter, NP  omeprazole (PRILOSEC) 40 MG capsule Take 1 capsule twice daily, 30 minutes before morning and evening meals. 08/24/20   [provider]  traZODone (DESYREL) 50 MG tablet Take 2 tablets (100 mg total) by mouth at bedtime as needed for sleep. 10/14/20   Salley Slaughter, NP    Allergies    Patient has no known allergies.  Review of Systems   Review of Systems  All other systems reviewed and are negative.   Physical Exam Updated Vital Signs BP 125/85   Pulse 97   Temp 98.8 F (37.1 C) (Oral)   Resp 18   Ht 1.626 m (5\' 4" )   Wt 119.3 kg   SpO2 97%   BMI 45.14 kg/m   Physical Exam Vitals and nursing note reviewed.  Constitutional:      General: She is not in acute distress.    Appearance: She is well-developed and well-nourished.  HENT:     Head: Normocephalic and atraumatic.     Right Ear: External ear normal.     Left Ear: External ear normal.  Eyes:     General: No scleral icterus.       Right eye: No discharge.        Left eye: No discharge.     Conjunctiva/sclera: Conjunctivae normal.  Neck:     Trachea: No tracheal deviation.  Cardiovascular:     Rate and Rhythm: Normal rate and regular rhythm.     Pulses: Intact distal pulses.  Pulmonary:     Effort: Pulmonary effort is normal. No respiratory distress.     Breath sounds: Normal breath sounds. No stridor. No wheezing or rales.  Abdominal:     General: Bowel sounds are normal. There is no distension.     Palpations: Abdomen is soft.     Tenderness: There is no abdominal tenderness. There is no guarding or rebound.  Musculoskeletal:        General: No tenderness or edema.     Cervical back: Neck supple.  Skin:    General: Skin is warm and dry.     Findings: No rash.  Neurological:     Mental Status: She is alert.     Cranial Nerves: No cranial nerve  deficit (no facial droop, extraocular movements intact, no slurred speech).     Sensory: No sensory deficit.     Motor: No abnormal muscle tone or seizure activity.  Coordination: Coordination normal.     Deep Tendon Reflexes: Strength normal.  Psychiatric:        Mood and Affect: Mood and affect normal.     ED Results / Procedures / Treatments   Labs (all labs ordered are listed, but only abnormal results are displayed) Labs Reviewed  CBC - Abnormal; Notable for the following components:      Result Value   RBC 3.70 (*)    Hemoglobin 9.7 (*)    HCT 30.2 (*)    RDW 18.5 (*)    All other components within normal limits  I-STAT BETA HCG BLOOD, ED (MC, WL, AP ONLY) - Abnormal; Notable for the following components:   I-stat hCG, quantitative 9.0 (*)    All other components within normal limits  BASIC METABOLIC PANEL  TROPONIN I (HIGH SENSITIVITY)    EKG None  Radiology DG Chest 2 View  Result Date: 10/27/2020 CLINICAL DATA:  Chest pain a EXAM: CHEST - 2 VIEW COMPARISON:  None. FINDINGS: The heart size and mediastinal contours are within normal limits. Both lungs are clear. The visualized skeletal structures are unremarkable. IMPRESSION: No active cardiopulmonary disease. Electronically Signed   By: Prudencio Pair M.D.   On: 10/27/2020 20:15    Procedures Procedures (including critical care time)  Medications Ordered in ED Medications - No data to display  ED Course  I have reviewed the triage vital signs and the nursing notes.  Pertinent labs & imaging results that were available during my care of the patient were reviewed by me and considered in my medical decision making (see chart for details).  Clinical Course as of 10/28/20 5009  Thu Oct 28, 2020  3818 Labs reviewed.  CBC and metabolic panel notable for anemia hemoglobin 9.7 [JK]  0805 I-STAT hCG is 9.  I suspect that is just a false positive.  Metabolic panel normal [JK]  0805 Chest x-ray without signs of  pneumonia [JK]  0818 Patient did have a COVID test yesterday at the urgent care that was negative [JK]    Clinical Course User Index [JK] Dorie Rank, MD   MDM Rules/Calculators/A&P                          Patient presented to the ED for evaluation of fever and tachycardia.  Patient's vital signs this morning are normal.  She is afebrile.  Her pulse was 100.  Patient had laboratory test that are reassuring.  She does not have pneumonia.  Did go over the fact that her hCG was slightly elevated.  I suspect this is a false positive but explained the patient if she does not have her menstrual period would recommend rechecking it.  Patient does have covid.  Patient's only risk factor is obesity.  She has not been vaccinated.  I asked patient if she was interested in being evaluated for any potential outpatient treatment.  Patient is not interested in that patient is stable for discharge. Final Clinical Impression(s) / ED Diagnoses Final diagnoses:  COVID-19 virus infection    Rx / DC Orders ED Discharge Orders    None       Dorie Rank, MD 10/28/20 [phone]

## 2020-10-28 NOTE — Discharge Instructions (Addendum)
Take Tylenol as needed for fever.  He can also take ibuprofen for body aches.  Return to the ED for any trouble with shortness of breath.  Remained quarantined at home for 10 days

## 2020-10-29 ENCOUNTER — Telehealth: Payer: Self-pay

## 2020-10-29 NOTE — Telephone Encounter (Signed)
Called to discuss with patient about COVID-19 symptoms and the use of one of the available treatments for those with mild to moderate Covid symptoms and at a high risk of hospitalization.  Pt appears to qualify for outpatient treatment due to co-morbid conditions and/or a member of an at-risk group in accordance with the FDA Emergency Use Authorization.    Symptom onset: 10/26/20 Vaccinated: No Booster? No Immunocompromised? No Qualifiers: Obesity  Unable to reach pt - Left message with call back number [phone].  Nancy Dickerson

## 2020-11-01 ENCOUNTER — Telehealth (INDEPENDENT_AMBULATORY_CARE_PROVIDER_SITE_OTHER): Payer: Medicaid Other | Admitting: Nurse Practitioner

## 2020-11-01 ENCOUNTER — Other Ambulatory Visit: Payer: Self-pay

## 2020-11-01 DIAGNOSIS — U071 COVID-19: Secondary | ICD-10-CM

## 2020-11-01 NOTE — Progress Notes (Signed)
   [place], [place]  [address] Phone:  [phone]   Fax:  [phone] Virtual Visit via Video Note  I connected with Nancy Dickerson on 11/01/20 at  3:00 PM EST by video.  I discussed the limitations, risks, security and privacy concerns of performing an evaluation and management service by telephone and the availability of in person appointments. I also discussed with the patient that there may be a patient responsible charge related to this service. The patient expressed understanding and agreed to proceed.  Patient home Provider Office  History of Present Illness:   Patient complains of fever and associated symptoms are shortness of breath, headache and tachycardia. She has had the fever up to 101.9 degrees,on 10/27/2020 .She was seen at Urgent Care diagnosed with COVID. She was sent to the ER due the tachycardia that did not respond to valsalva maneuver. She was in the ER for 16 hours and did not receive any additional treatment. She is having flutters. She has had this in the past and they continue to come and go. Her heart rate has not been elevated in the last few days. Symptoms have been improved. . Patient denies abdominal pain, diarrhea, otitis symptoms and urinary tract symptoms. She has tried to alleviate the symptoms with acetaminophen with moderate relief. This was initiated in the Urgent Care The patient has no known comorbidities (structural heart/valvular disease, prosthetic joints, immunocompromised state, recent dental work, known abscesses).  Currently she has decreased endurance. She has been monitoring her BP with was elevated on yesterday BP 120's/ 100's   Observations/Objective: BP while on the phone 172/82 HR 86 while on the phone. She was able to speak in full sentences without difficulty, visual apperance in no acute distress   Assessment and Plan: Assessment  Primary Diagnosis & Pertinent Problem List: The encounter  diagnosis was COVID-19.  Visit Diagnosis: 1. COVID-19  Stable symptoms are improving Encouraged patient to continue to monitor of any changes follow up.       Follow Up Instructions: Apt as scheduled    I discussed the assessment and treatment plan with the patient. The patient was provided an opportunity to ask questions and all were answered. The patient agreed with the plan and demonstrated an understanding of the instructions.   The patient was advised to call back or seek an in-person evaluation if the symptoms worsen or if the condition fails to improve as anticipated.  I provided 30 minutes of video- face-to-face time during this encounter.   Vevelyn Francois, NP

## 2020-11-01 NOTE — Progress Notes (Signed)
Would like to discuss recent EKG from hospital visit.  Having trouble with breathing. Very bad headaches

## 2020-11-26 ENCOUNTER — Encounter: Payer: Self-pay | Admitting: Nurse Practitioner

## 2020-11-26 ENCOUNTER — Other Ambulatory Visit: Payer: Self-pay

## 2020-11-26 ENCOUNTER — Ambulatory Visit (INDEPENDENT_AMBULATORY_CARE_PROVIDER_SITE_OTHER): Payer: Medicaid Other | Admitting: Nurse Practitioner

## 2020-11-26 VITALS — BP 132/85 | HR 85 | Ht 64.0 in | Wt 260.0 lb

## 2020-11-26 DIAGNOSIS — Z6841 Body Mass Index (BMI) 40.0 and over, adult: Secondary | ICD-10-CM

## 2020-11-26 DIAGNOSIS — R000 Tachycardia, unspecified: Secondary | ICD-10-CM | POA: Diagnosis not present

## 2020-11-26 DIAGNOSIS — J449 Chronic obstructive pulmonary disease, unspecified: Secondary | ICD-10-CM | POA: Diagnosis not present

## 2020-11-26 NOTE — Progress Notes (Signed)
[place], [place]  [address] Phone:  [phone]   Fax:  [phone]   Established Patient Office Visit  Subjective:  Patient ID: Nancy Dickerson, female    DOB: 1981/08/25  Age: 40 y.o. MRN: [id number]  CC:  Chief Complaint  Patient presents with  . Follow-up    HPI Nancy Dickerson presents for follow up. She  has a past medical history of Anemia, Asthma, Bursitis of hip, Cardiomegaly, Chondromalacia of hip, Chondromalacia of right knee, COPD (chronic obstructive pulmonary disease) ([place]), GERD (gastroesophageal reflux disease), H/O transfusion of packed red blood cells, and Tinnitus.   Patient is in today for follow-up for weight loss management.  She was encouraged to make lifestyle modifications by increasing water eating small regular meals daily to promote metabolism.  Patient reinforcement of a healthy well-balanced diet.  Patient instructed during the visit if she did show some weight loss.  Weight loss supplement would be a possibility.  October 15, 2020 weight at that time 263.  Current weight 260.  Patient tested positive for COVID-19 on 10/27/2020 with an increased heart rate of 140 150 bpm which was thought to be contributing to elevated temp of 102.8.  She admits that her symptoms have resolved.  She denies any further problems with tachycardia.  She has been checking her blood pressure and heart rate at home and denies any abnormalities. Past Medical History:  Diagnosis Date  . Anemia   . Asthma   . Bursitis of hip   . Cardiomegaly   . Chondromalacia of hip   . Chondromalacia of right knee   . COPD (chronic obstructive pulmonary disease) ([place])   . GERD (gastroesophageal reflux disease)   . H/O transfusion of packed red blood cells   . Tinnitus     Past Surgical History:  Procedure Laterality Date  . CERVICAL CONIZATION W/BX N/A 06/25/2019   Procedure: CONIZATION CERVIX WITH BIOPSY - COLD KNIFE;  Surgeon: Emily Filbert,  MD;  Location: [facility]  Service: Gynecology;  Laterality: N/A;  . ENDOMETRIAL ABLATION N/A 06/25/2019   Procedure: MINERVA ABLATION;  Surgeon: Emily Filbert, MD;  Location: [facility];  Service: Gynecology;  Laterality: N/A;  [employer] rep will here confirmed on 06/20/19 CS  . HYSTEROSCOPY WITH D & C  06/25/2019   Procedure: DILATATION AND CURETTAGE /HYSTEROSCOPY;  Surgeon: Emily Filbert, MD;  Location: [facility];  Service: Gynecology;;  . NO PAST SURGERIES      Family History  Problem Relation Age of Onset  . Sudden death Father 14    Social History   Socioeconomic History  . Marital status: Single    Spouse name: Not on file  . Number of children: 6  . Years of education: 26  . Highest education level: Not on file  Occupational History  . 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 Smoking status: Never Smoker  . Smokeless tobacco: Never Used  Vaping Use  . Vaping Use: Never used  Substance and Sexual Activity  . Alcohol use: Yes    Comment: rare  . Drug use: Yes    Types: Marijuana  . Sexual activity: Not on file  Other Topics Concern  . Not on file  Social History Narrative   ** Merged History Encounter **         Lives with someone   Right handed    Little caffeine - drinks  Sprite   Education - some college          Social Determinants of Radio broadcast assistant Strain: Not on file  Food Insecurity: No Food Insecurity  . Worried About Charity fundraiser in the Last Year: Never true  . Ran Out of Food in the Last Year: Never true  Transportation Needs: No Transportation Needs  . Lack of Transportation (Medical): No  . Lack of Transportation (Non-Medical): No  Physical Activity: Not on file  Stress: Not on file  Social Connections: Not on file  Intimate Partner Violence: Not on file    Outpatient Medications Prior to Visit  Medication Sig Dispense Refill  . DULoxetine (CYMBALTA) 30 MG capsule Take 1 capsule (30  mg total) by mouth daily. 30 capsule 2  . fluticasone (FLOVENT HFA) 110 MCG/ACT inhaler Inhale into the lungs 2 (two) times daily.    . hydrOXYzine (ATARAX/VISTARIL) 10 MG tablet Take 1 tablet (10 mg total) by mouth 3 (three) times daily as needed. 90 tablet 2  . traZODone (DESYREL) 50 MG tablet Take 2 tablets (100 mg total) by mouth at bedtime as needed for sleep. 60 tablet 2  . cyclobenzaprine (FLEXERIL) 10 MG tablet Take 1 tablet (10 mg total) by mouth every 8 (eight) hours as needed for muscle spasms. (Patient not taking: No sig reported) 30 tablet 0  . DULoxetine (CYMBALTA) 20 MG capsule Take 20 mg by mouth daily.    Marland Kitchen omeprazole (PRILOSEC) 40 MG capsule Take 1 capsule twice daily, 30 minutes before morning and evening meals. (Patient not taking: Reported on 11/01/2020)     No facility-administered medications prior to visit.    No Known Allergies  ROS Review of Systems    Objective:    Physical Exam Constitutional:      Appearance: She is obese.  HENT:     Head: Normocephalic and atraumatic.     Nose: Nose normal.     Mouth/Throat:     Mouth: Mucous membranes are moist.  Cardiovascular:     Rate and Rhythm: Normal rate and regular rhythm.     Pulses: Normal pulses.     Heart sounds: Normal heart sounds.  Pulmonary:     Effort: Pulmonary effort is normal.     Breath sounds: Normal breath sounds.  Musculoskeletal:        General: Normal range of motion.     Cervical back: Normal range of motion.  Skin:    General: Skin is warm and dry.     Capillary Refill: Capillary refill takes less than 2 seconds.  Neurological:     General: No focal deficit present.     Mental Status: She is alert and oriented to person, place, and time.  Psychiatric:        Mood and Affect: Mood normal.        Behavior: Behavior normal.        Thought Content: Thought content normal.     BP 132/85   Pulse 85   Ht 5\' 4"  (1.626 m)   Wt 260 lb (117.9 kg)   SpO2 97%   BMI 44.63 kg/m  Wt  Readings from Last 3 Encounters:  11/29/20 263 lb 3.2 oz (119.4 kg)  11/26/20 260 lb (117.9 kg)  10/27/20 263 lb (119.3 kg)     Health Maintenance Due  Topic Date Due  . COVID-19 Vaccine (1) Never done    There are no preventive care reminders to display for this  patient.  Lab Results  Component Value Date   TSH 0.768 07/08/2018   Lab Results  Component Value Date   WBC 8.2 10/27/2020   HGB 9.7 (L) 10/27/2020   HCT 30.2 (L) 10/27/2020   MCV 81.6 10/27/2020   PLT 392 10/27/2020   Lab Results  Component Value Date   NA 135 10/27/2020   K 3.6 10/27/2020   CO2 24 10/27/2020   GLUCOSE 94 10/27/2020   BUN 8 10/27/2020   CREATININE 0.83 10/27/2020   BILITOT 0.6 05/13/2019   ALKPHOS 46 05/13/2019   AST 19 05/13/2019   ALT 21 05/13/2019   PROT 7.6 05/13/2019   ALBUMIN 4.0 05/13/2019   CALCIUM 9.4 10/27/2020   ANIONGAP 11 10/27/2020   Lab Results  Component Value Date   CHOL 115 01/30/2018   Lab Results  Component Value Date   HDL 39 (L) 01/30/2018   Lab Results  Component Value Date   LDLCALC 58 01/30/2018   Lab Results  Component Value Date   TRIG 89 01/30/2018   Lab Results  Component Value Date   CHOLHDL 2.9 01/30/2018   Lab Results  Component Value Date   HGBA1C 5.7 (H) 06/03/2020      Assessment & Plan:   Problem List Items Addressed This Visit      Respiratory   Chronic obstructive pulmonary disease (HCC) Stable on no current treatment     Other   Class 3 severe obesity due to excess calories without serious comorbidity with body mass index (BMI) of 40.0 to 44.9 in adult ([place]) - Primary Persistent slightly improved down 3 pounds in 1 month.  Admits that she has been making lifestyle modifications.  Patient desires to start phentermine however due to tachycardia heart rate greater than 140 while tested positive for COVID-19.  Patient was told that precautions had to be taken in the agreement was a at least a 7-day Holter monitor be placed  before phentermine to be prescribed    Other Visit Diagnoses    Tachycardia   Referral to cardiology to have 7-day cardiac monitor placed   Relevant Orders   Ambulatory referral to Cardiology      No orders of the defined types were placed in this encounter.   Follow-up: Return in about 6 weeks (around 01/07/2021) for Follow-up for weight management [address].    Vevelyn Francois, NP

## 2020-11-26 NOTE — Patient Instructions (Signed)
Ventricular Tachycardia  Ventricular tachycardia is a type of arrhythmia that begins in the lower chambers of the heart (ventricles). An arrhythmia is a type of abnormal heart rhythm. A normal heartbeat usually starts when an area in the heart called the sinoatrial (SA) node releases an electrical signal. With ventricular tachycardia, electrical signals in the ventricles fire abnormally and interfere with the electrical signals released by the SA node. A normal resting heart rate is 60-100 beats per minute. During an episode of ventricular tachycardia, the heart reaches 100 beats per minute or higher. This condition can be life-threatening and should be treated right away. What are the causes? This condition is caused by abnormal electrical activity in your ventricles. This may result from:  Your heart not getting enough oxygen. Blood flow problems in your arteries may cause this.  Cardiomyopathy, or diseases of your heart muscle.  Medicines.  Sarcoidosis, or a disease that causes inflammation and affects multiple areas of your body.  Drug use, such as use of cocaine, methamphetamines, or anabolic steroids. What increases the risk? The following factors may make you more likely to develop this condition:  A previous heart attack.  Diseases or disorders of your heart, such as: ? Structural abnormalities of the heart. These may be present at birth (congenital) or may develop over time. ? Narrowed or blocked arteries that lead to the heart (coronary artery disease). ? Abnormal heart tissue. ? Leaking or narrow valves in your heart. ? A family history of stopped heartbeat (cardiac arrest). ? A family history of coronary artery disease.  Certain health conditions that can cause heart problems, such as: ? Diabetes. ? An infection that affects your heart. ? High blood pressure (hypertension). ? An overactive or underactive thyroid. ? Sleep apnea. This is paused breathing or shallow  breathing during sleep. ? High cholesterol.  Using products that contain tobacco.  Heavy alcohol use. What are the signs or symptoms? Symptoms of this condition include:  Fast or irregular heartbeats (palpitations).  Shortness of breath.  Anxiety.  Dizziness or light-headedness.  Fainting.  Chest pain.  Cardiac arrest caused by an arrhythmia. How is this diagnosed? This condition may be diagnosed based on:  Electrocardiogram (ECG), which checks for problems with electrical activity in your heart.  A physical exam.  Your symptoms and medical history.  Holter monitor or event monitor test, which involves wearing a portable device that monitors your heart rate over time. You may also have other tests, including:  Blood tests.  Chest X-ray.  Echocardiogram. This test uses sound waves to create images of your heart.  Angiogram. During this test, dye is injected into your bloodstream, and then X-rays are taken. The dye helps to show blood flow in your arteries.  Exercise stress test. During this test, you will have an ECG while you exercise on a treadmill.  Cardiac CT scan or cardiac MRI. How is this treated? This condition is a medical emergency that must be treated right away. If the condition is not treated right away, it becomes life-threatening. Treatment for this condition depends on the cause. Treatment may include:  An electric shock, or cardioversion, to return your heartbeat to a normal rhythm.  Medicines that slow your heart rate and return it to a normal rhythm (anti-arrhythmics).  An electrophysiology study. This can help locate areas of heart tissue that are causing fast heartbeats. ? In this procedure, a long, thin tube (catheter) is inserted into a vein and moved to your heart to evaluate  your heart's electrical activity. ? In some cases, the heart tissue that is causing problems may be burned out with a low-level energy source delivered through the  catheter. This may help your heart keep a normal rhythm.  An implantable cardioverter-defibrillator (ICD). This device is inserted under the skin in your chest to monitor your heartbeat. When the ICD senses an irregular heartbeat, it sends a shock to return the heartbeat to normal.  A wearable cardioverter-defibrillator (WCD) to use while treatment options are being evaluated.  Genetic counseling to check whether your family members are at risk for ventricular tachycardia.  Surgery to improve blood flow to the heart. Follow these instructions at home: Eating and drinking  Eat a healthy diet. This includes plenty of fruits and vegetables, whole grains, lean meats, and low-fat or fat-free dairy products.  Avoid eating foods that are high in saturated fat, trans fat, sugar, or salt.   Lifestyle  Do not drink alcohol if: ? Your health care provider tells you not to drink. ? You are pregnant, may be pregnant, or are planning to become pregnant.  If you drink alcohol: ? Limit how much you use to:  0-1 drink a day for women.  0-2 drinks a day for men. ? Be aware of how much alcohol is in your drink. In the [place] one drink equals one 12 oz bottle of beer (355 mL), one 5 oz glass of wine (148 mL), or one 1 oz glass of hard liquor (44 mL).  Do not use any products that contain nicotine or tobacco, such as cigarettes, e-cigarettes, and chewing tobacco. If you need help quitting, ask your health care provider.  Do not use drugs that excite, or stimulate, your system, such as cocaine or methamphetamines.  Maintain a healthy weight.  Manage stress through relaxation exercises, yoga, quiet time, or meditation.  Try to get at least 7 hours of sleep each night.   General instructions  Take over-the-counter and prescription medicines only as told by your health care provider.  Exercise regularly. Ask what activities are safe for you. Aim for one or a combination of the following each  week: ? 150 minutes of moderate-intensity exercise. ? 75 minutes of exercise that takes a lot of effort.  Keep all follow-up visits as told by your health care provider. This is important. Where to find more information  American Heart Association: www.heart.org Contact a health care provider if:  Your symptoms get worse.  You develop new symptoms, such as new palpitations or shortness of breath.  You feel depressed. Get help right away if you have:  An episode of ventricular tachycardia that lasts more than a few seconds.  Chest pain.  Trouble breathing.  Light-headedness or fainting. These symptoms may represent a serious problem that is an emergency. Do not wait to see if the symptoms will go away. Get medical help right away. Call your local emergency services (911 in the [place] Do not drive yourself to the hospital. Summary  Ventricular tachycardia is a fast heartbeat that begins in the lower chambers of the heart (ventricles). This condition can be life-threatening and should be treated right away.  Treatment may include electric shock, medicines, low-level energy therapy, a cardioverter-defibrillator, or surgery.  Get help right away if you have ventricular tachycardia symptoms lasting longer than a few seconds, chest pain, or trouble breathing. This information is not intended to replace advice given to you by your health care provider. Make sure you discuss any questions you  have with your health care provider. Document Revised: 07/30/2019 Document Reviewed: 07/30/2019 Elsevier Patient Education  [employer].

## 2020-11-28 NOTE — Progress Notes (Addendum)
Cardiology Office Note   Date:  12/02/2020   ID:  Nancy Dickerson, Nancy Dickerson 21, 1982, MRN [id number]  PCP:  Vevelyn Francois, NP  Cardiologist:   No primary care provider on file. Referring:  Vevelyn Francois, NP  Chief Complaint  Patient presents with  . Palpitations      History of Present Illness: Nancy Dickerson is a 40 y.o. female who is referred by Vevelyn Francois, NP for evaluation of tachycardia.  She has no pain history. You see an echocardiogram from a few years ago 50 to 55%. She describes some kind of stress testing but it was not a treadmill test. She kept being told that she had poor inspiratory effort. I do not have these results. 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 However, she really could not give me any other details on her cardiac history. She has some chronic anemia. She is sent here because of palpitations. I do see that she was in the emergency room. This was in January of this year. She tested positive for Covid. This was her second bout of Covid and she had it last year as well. She was seen initially in urgent care and her heart rate was in the 140s. I see an EKG with a heart rate of 137 sinus tachycardia. I see another EKG with heart rates again sinus tachycardia in the 120s. She says she feels her heart fluttering. It comes on at rest. She is limited in her walking secondary to increased heart rate and shortness of breath. She gets some mild chest tightness. He does not have presyncope or syncope. She is not describing PND or orthopnea. She has had no cough fevers or chills.  Past Medical History:  Diagnosis Date  . Anemia   . Asthma   . Bursitis of hip   . Cardiomegaly   . Chondromalacia of hip   . Chondromalacia of right knee   . COPD (chronic obstructive pulmonary disease) ([place])   . GERD (gastroesophageal reflux disease)   . H/O transfusion of packed red blood cells   . Tinnitus     Past Surgical  History:  Procedure Laterality Date  . CERVICAL CONIZATION W/BX N/A 06/25/2019   Procedure: CONIZATION CERVIX WITH BIOPSY - COLD KNIFE;  Surgeon: Emily Filbert, MD;  Location: [facility];  Service: Gynecology;  Laterality: N/A;  . ENDOMETRIAL ABLATION N/A 06/25/2019   Procedure: MINERVA ABLATION;  Surgeon: Emily Filbert, MD;  Location: [facility];  Service: Gynecology;  Laterality: N/A;  Minerva rep will here confirmed on 06/20/19 CS  . HYSTEROSCOPY WITH D & C  06/25/2019   Procedure: DILATATION AND CURETTAGE /HYSTEROSCOPY;  Surgeon: Emily Filbert, MD;  Location: [facility];  Service: Gynecology;;  . NO PAST SURGERIES       Current Outpatient Medications  Medication Sig Dispense Refill  . DULoxetine (CYMBALTA) 30 MG capsule Take 1 capsule (30 mg total) by mouth daily. 30 capsule 2  . fluticasone (FLOVENT HFA) 110 MCG/ACT inhaler Inhale into the lungs 2 (two) times daily.    . hydrOXYzine (ATARAX/VISTARIL) 10 MG tablet Take 1 tablet (10 mg total) by mouth 3 (three) times daily as needed. 90 tablet 2  . traZODone (DESYREL) 50 MG tablet Take 2 tablets (100 mg total) by mouth at bedtime as needed for sleep. 60 tablet 2   No current facility-administered medications for  this visit.    Allergies:   Patient has no known allergies.    Social History:  The patient  reports that she has never smoked. She has never used smokeless tobacco. She reports current alcohol use. She reports current drug use. Drug: Marijuana.   Family History:  The patient's family history includes Sudden death (age of onset: 61) in her father.    ROS:  Please see the history of present illness.   Otherwise, review of systems are positive for none.   All other systems are reviewed and negative.    PHYSICAL EXAM: VS:  BP 130/84   Pulse 73   Ht 5\' 4"  (1.626 m)   Wt 263 lb 3.2 oz (119.4 kg)   BMI 45.18 kg/m  , BMI Body mass index is 45.18 kg/m. GENERAL:  Well appearing HEENT:   Pupils equal round and reactive, fundi not visualized, oral mucosa unremarkable NECK:  No jugular venous distention, waveform within normal limits, carotid upstroke brisk and symmetric, no bruits, no thyromegaly LYMPHATICS:  No cervical, inguinal adenopathy LUNGS:  Clear to auscultation bilaterally BACK:  No CVA tenderness CHEST:  Unremarkable HEART:  PMI not displaced or sustained,S1 and S2 within normal limits, no S3, no S4, no clicks, no rubs, no murmurs ABD:  Flat, positive bowel sounds normal in frequency in pitch, no bruits, no rebound, no guarding, no midline pulsatile mass, no hepatomegaly, no splenomegaly EXT:  2 plus pulses throughout, no edema, no cyanosis no clubbing SKIN:  No rashes no nodules NEURO:  Cranial nerves II through XII grossly intact, motor grossly intact throughout PSYCH:  Cognitively intact, oriented to person place and time    EKG:  EKG is ordered today. The ekg ordered today demonstrates sinus rhythm, rate 73, axis 50, intervals within normal limits, no acute ST-T wave changes.   Recent Labs: 10/27/2020: BUN 8; Creatinine, Ser 0.83; Hemoglobin 9.7; Platelets 392; Potassium 3.6; Sodium 135    Lipid Panel    Component Value Date/Time   CHOL 115 01/30/2018 0355   TRIG 89 01/30/2018 0355   HDL 39 (L) 01/30/2018 0355   CHOLHDL 2.9 01/30/2018 0355   VLDL 18 01/30/2018 0355   LDLCALC 58 01/30/2018 0355      Wt Readings from Last 3 Encounters:  11/29/20 263 lb 3.2 oz (119.4 kg)  11/26/20 260 lb (117.9 kg)  10/27/20 263 lb (119.3 kg)      Other studies Reviewed: Additional studies/ records that were reviewed today include: Echo and ED records. Review of the above records demonstrates:  Please see elsewhere in the note.     ASSESSMENT AND PLAN:  TACHYCARDIA:    I am going to have her wear a 2-week event monitor. I do note that she has not had recent thyroid so I would like her to get this done when she comes back. She is anemic but this is  baseline.  SOB: Given the chest discomfort shortness of breath and tachycardia I am going to bring her back for a POET (Plain Old Exercise Treadmill)   Current medicines are reviewed at length with the patient today.  The patient does not have concerns regarding medicines.  The following changes have been made:  no change  Labs/ tests ordered today include:   Orders Placed This Encounter  Procedures  . Cardiac Stress Test: Informed Consent Details: Physician/Practitioner Attestation; Transcribe to consent form and obtain patient signature  . EXERCISE TOLERANCE TEST (ETT)  . LONG TERM MONITOR (3-14 DAYS)  .  EKG 12-Lead     Disposition:   FU with me in one month.     Signed, Minus Breeding, MD  12/02/2020 9:07 AM    [facility]

## 2020-11-29 ENCOUNTER — Ambulatory Visit (INDEPENDENT_AMBULATORY_CARE_PROVIDER_SITE_OTHER): Payer: Medicaid Other

## 2020-11-29 ENCOUNTER — Encounter: Payer: Self-pay | Admitting: Cardiology

## 2020-11-29 ENCOUNTER — Other Ambulatory Visit: Payer: Self-pay

## 2020-11-29 ENCOUNTER — Ambulatory Visit (INDEPENDENT_AMBULATORY_CARE_PROVIDER_SITE_OTHER): Payer: Medicaid Other | Admitting: Cardiology

## 2020-11-29 ENCOUNTER — Encounter: Payer: Self-pay | Admitting: *Deleted

## 2020-11-29 VITALS — BP 130/84 | HR 73 | Ht 64.0 in | Wt 263.2 lb

## 2020-11-29 DIAGNOSIS — R000 Tachycardia, unspecified: Secondary | ICD-10-CM

## 2020-11-29 DIAGNOSIS — R0602 Shortness of breath: Secondary | ICD-10-CM

## 2020-11-29 NOTE — Progress Notes (Signed)
Patient ID: Nancy Dickerson, female   DOB: 1981-09-27, 40 y.o.   MRN: [id number] Patient enrolled for [employer] to ship a 14 day ZIO XT to her home.

## 2020-11-29 NOTE — Patient Instructions (Signed)
Medication Instructions:  Your physician recommends that you continue on your current medications as directed. Please refer to the Current Medication list given to you today.  Testing/Procedures: Your physician has requested that you have an exercise tolerance test. For further information please visit [email or web address]. Please also follow instruction sheet, as given. --this will require a covid test 3 days prior, we will schedule this for you  ZIO XT- Long Term Monitor Instructions   Your physician has requested you wear your ZIO patch monitor 14 days.   This is a single patch monitor.  [employer] supplies one patch monitor per enrollment.  Additional stickers are not available.   Please do not apply patch if you will be having a Nuclear Stress Test, Echocardiogram, Cardiac CT, MRI, or Chest Xray during the time frame you would be wearing the monitor. The patch cannot be worn during these tests.  You cannot remove and re-apply the ZIO XT patch monitor.   Your ZIO patch monitor will be sent USPS Priority mail from [facility] directly to your home address. The monitor may also be mailed to a PO BOX if home delivery is not available.   It may take 3-5 days to receive your monitor after you have been enrolled.   Once you have received you monitor, please review enclosed instructions.  Your monitor has already been registered assigning a specific monitor serial # to you.   Applying the monitor   Shave hair from upper left chest.   Hold abrader disc by orange tab.  Rub abrader in 40 strokes over left upper chest as indicated in your monitor instructions.   Clean area with 4 enclosed alcohol pads .  Use all pads to assure are is cleaned thoroughly.  Let dry.   Apply patch as indicated in monitor instructions.  Patch will be place under collarbone on left side of chest with arrow pointing upward.   Rub patch adhesive wings for 2 minutes.Remove white label marked "1".  Remove white  label marked "2".  Rub patch adhesive wings for 2 additional minutes.   While looking in a mirror, press and release button in center of patch.  A small green light will flash 3-4 times .  This will be your only indicator the monitor has been turned on.     Do not shower for the first 24 hours.  You may shower after the first 24 hours.   Press button if you feel a symptom. You will hear a small click.  Record Date, Time and Symptom in the Patient Log Book.   When you are ready to remove patch, follow instructions on last 2 pages of Patient Log Book.  Stick patch monitor onto last page of Patient Log Book.   Place Patient Log Book in [place] box.  Use locking tab on box and tape box closed securely.  The [employer] and [employer] has [employer] on it.  Please place in mailbox as soon as possible.  Your physician should have your test results approximately 7 days after the monitor has been mailed back to [facility].   Call [facility] at [phone] if you have questions regarding your ZIO XT patch monitor.  Call them immediately if you see an orange light blinking on your monitor.   If your monitor falls off in less than 4 days contact our Monitor department at [phone].  If your monitor becomes loose or falls off after 4 days call [employer] at [phone] for suggestions on  securing your monitor.   Follow-Up: At [facility], you and your health needs are our priority.  As part of our continuing mission to provide you with exceptional heart care, we have created designated Provider Care Teams.  These Care Teams include your primary Cardiologist (physician) and Advanced Practice Providers (APPs -  Physician Assistants and Nurse Practitioners) who all work together to provide you with the care you need, when you need it.  We recommend signing up for the patient portal called "MyChart".  Sign up information is provided on this After Visit Summary.  MyChart is used to  connect with patients for Virtual Visits (Telemedicine).  Patients are able to view lab/test results, encounter notes, upcoming appointments, etc.  Non-urgent messages can be sent to your provider as well.   To learn more about what you can do with MyChart, go to [email or web address].    Your next appointment:   AS NEEDED with Dr. Percival Spanish

## 2020-11-29 NOTE — Addendum Note (Signed)
Addended by: Patria Mane A on: 11/29/2020 12:55 PM   Modules accepted: Orders

## 2020-12-02 ENCOUNTER — Encounter: Payer: Self-pay | Admitting: Cardiology

## 2020-12-02 NOTE — Addendum Note (Signed)
Addended by: Minus Breeding on: 12/02/2020 09:08 AM   Modules accepted: Orders

## 2020-12-07 ENCOUNTER — Other Ambulatory Visit (HOSPITAL_COMMUNITY): Payer: Medicaid Other

## 2020-12-07 ENCOUNTER — Telehealth (HOSPITAL_COMMUNITY): Payer: Self-pay | Admitting: Cardiology

## 2020-12-07 DIAGNOSIS — R000 Tachycardia, unspecified: Secondary | ICD-10-CM

## 2020-12-07 NOTE — Telephone Encounter (Signed)
Patient called and cancelled GXT for reason below:  12/07/2020 11:45 AM By: By: By: Laurine Blazer, FALECHA L   Cancel Rsn: Patient (can't take time off from work ..will cb to rsc/fc)   Order will be removed from the WQ.

## 2020-12-10 ENCOUNTER — Inpatient Hospital Stay (HOSPITAL_COMMUNITY): Admission: RE | Admit: 2020-12-10 | Payer: Medicaid Other | Source: Ambulatory Visit

## 2020-12-13 ENCOUNTER — Other Ambulatory Visit: Payer: Self-pay

## 2020-12-13 ENCOUNTER — Ambulatory Visit (HOSPITAL_COMMUNITY)
Admission: EM | Admit: 2020-12-13 | Discharge: 2020-12-13 | Disposition: A | Discharge status: Home / Self Care | Payer: Medicaid Other | Attending: Medical Oncology | Admitting: Medical Oncology

## 2020-12-13 ENCOUNTER — Encounter (HOSPITAL_COMMUNITY): Payer: Self-pay | Admitting: *Deleted

## 2020-12-13 ENCOUNTER — Ambulatory Visit (INDEPENDENT_AMBULATORY_CARE_PROVIDER_SITE_OTHER): Payer: Medicaid Other

## 2020-12-13 DIAGNOSIS — S61309A Unspecified open wound of unspecified finger with damage to nail, initial encounter: Secondary | ICD-10-CM

## 2020-12-13 DIAGNOSIS — M79672 Pain in left foot: Secondary | ICD-10-CM

## 2020-12-13 DIAGNOSIS — M7989 Other specified soft tissue disorders: Secondary | ICD-10-CM | POA: Diagnosis not present

## 2020-12-13 DIAGNOSIS — S99922A Unspecified injury of left foot, initial encounter: Secondary | ICD-10-CM | POA: Diagnosis not present

## 2020-12-13 DIAGNOSIS — Z23 Encounter for immunization: Secondary | ICD-10-CM | POA: Diagnosis not present

## 2020-12-13 MED ORDER — DOXYCYCLINE HYCLATE 100 MG PO CAPS: 100.0000 mg | ORAL_CAPSULE | Freq: Two times a day (BID) | ORAL | 0 refills | Status: DC

## 2020-12-13 MED ORDER — IBUPROFEN 800 MG PO TABS: 800.0000 mg | ORAL_TABLET | Freq: Three times a day (TID) | ORAL | 0 refills | Status: DC

## 2020-12-13 MED ORDER — TETANUS-DIPHTH-ACELL PERTUSSIS 5-2.5-18.5 LF-MCG/0.5 IM SUSY
PREFILLED_SYRINGE | INTRAMUSCULAR | Status: AC
  Filled 2020-12-13: qty 0.5

## 2020-12-13 MED ORDER — TETANUS-DIPHTH-ACELL PERTUSSIS 5-2.5-18.5 LF-MCG/0.5 IM SUSY
0.5000 mL | PREFILLED_SYRINGE | Freq: Once | INTRAMUSCULAR | Status: AC
  Administered 2020-12-13: 0.5 mL via INTRAMUSCULAR
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
## 2020-12-13 NOTE — ED Notes (Signed)
Ace wrap to left foot, cleansed right fingers and non-stick dsg with coban applied.
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
## 2020-12-31 ENCOUNTER — Telehealth (HOSPITAL_COMMUNITY): Payer: Self-pay | Admitting: Cardiology

## 2020-12-31 ENCOUNTER — Telehealth: Payer: Self-pay | Admitting: Cardiology

## 2020-12-31 ENCOUNTER — Telehealth: Payer: Self-pay | Admitting: *Deleted

## 2020-12-31 MED ORDER — METOPROLOL TARTRATE 25 MG PO TABS: 25.0000 mg | ORAL_TABLET | Freq: Two times a day (BID) | ORAL | 3 refills | Status: DC

## 2020-12-31 NOTE — Telephone Encounter (Signed)
Patient states she got her results for her Zio in her mychart. She wanted to know if she would need to come in for another appointment or not. Please advise

## 2020-12-31 NOTE — Telephone Encounter (Signed)
I responded in the results notes.

## 2020-12-31 NOTE — Telephone Encounter (Signed)
Spoke with patient who states that she saw her Zio Heart Monitor results on MyChart. Patient concerned about summary stating bigeminy, trigeminy, and SVT with rate 292. Patient states she was wanting to see what Dr. Percival Spanish recommendations are. Patient would like to know if she needs an appointment or not. Advised patient that Dr. Percival Spanish has not yet reviewed the results yet but as soon as he did someone will be in touch with her in regards to his recommendations. Advised patient I would forward message to Dr. Percival Spanish. Patient verbalized understanding.   Patient also states that she has not been able to get ETT scheduled due to work schedule issues. Patient would like to try and get ETT scheduled for either early morning or a day off if possible. Advised patient I would send message to Scheduling to get someone to reach out to her in regard to this. Patient verbalized understanding.

## 2020-12-31 NOTE — Telephone Encounter (Signed)
-----   Message from Minus Breeding, MD sent at 12/31/2020  1:04 PM EDT ----- I would suggest a low dose beta blocker with the SVT noted on the monitor.  Metoprolol 25 mg bid.  Follow up with APP in 2 weeks.  Call Ms. Vandall with the results and send results to Vevelyn Francois, NP

## 2020-12-31 NOTE — Telephone Encounter (Signed)
Called patient to discuss rescheduling the ETT and COVID screening ordered by Dr. Percival Spanish.  Patient states she will check with her supervisor regarding her scheduled and call back to reschedule.

## 2020-12-31 NOTE — Telephone Encounter (Signed)
Order for GXT will be removed from the GXT WQ and when patient calls back to reschedule we will reinstate the order.   12/07/2020 11:45 AM FV:[facility], FALECHA L  Cancel Rsn: Patient (can't take time off from work ..will cb to rsc/fc)  Thank you.

## 2020-12-31 NOTE — Telephone Encounter (Signed)
Spoke with pt, aware of results. New script sent to the pharmacy and Follow up scheduled

## 2021-01-07 ENCOUNTER — Other Ambulatory Visit: Payer: Self-pay

## 2021-01-07 ENCOUNTER — Encounter: Payer: Self-pay | Admitting: Nurse Practitioner

## 2021-01-07 ENCOUNTER — Ambulatory Visit (INDEPENDENT_AMBULATORY_CARE_PROVIDER_SITE_OTHER): Payer: Medicaid Other | Admitting: Nurse Practitioner

## 2021-01-07 VITALS — BP 128/79 | HR 99 | Ht 64.0 in | Wt 263.0 lb

## 2021-01-07 DIAGNOSIS — Z566 Other physical and mental strain related to work: Secondary | ICD-10-CM | POA: Diagnosis not present

## 2021-01-07 DIAGNOSIS — R197 Diarrhea, unspecified: Secondary | ICD-10-CM | POA: Diagnosis not present

## 2021-01-07 DIAGNOSIS — Z6841 Body Mass Index (BMI) 40.0 and over, adult: Secondary | ICD-10-CM | POA: Diagnosis not present

## 2021-01-07 MED ORDER — PHENTERMINE HCL 37.5 MG PO CAPS: 37.5000 mg | ORAL_CAPSULE | ORAL | 0 refills | Status: DC

## 2021-01-07 NOTE — Patient Instructions (Addendum)
Calorie Counting for Weight Loss Calories are units of energy. Your body needs a certain number of calories from food to keep going throughout the day. When you eat or drink more calories than your body needs, your body stores the extra calories mostly as fat. When you eat or drink fewer calories than your body needs, your body burns fat to get the energy it needs. Calorie counting means keeping track of how many calories you eat and drink each day. Calorie counting can be helpful if you need to lose weight. If you eat fewer calories than your body needs, you should lose weight. Ask your health care provider what a healthy weight is for you. For calorie counting to work, you will need to eat the right number of calories each day to lose a healthy amount of weight per week. A dietitian can help you figure out how many calories you need in a day and will suggest ways to reach your calorie goal.  A healthy amount of weight to lose each week is usually 1-2 lb (0.5-0.9 kg). This usually means that your daily calorie intake should be reduced by 500-750 calories.  Eating 1,200-1,500 calories a day can help most women lose weight.  Eating 1,500-1,800 calories a day can help most men lose weight. What do I need to know about calorie counting? Work with your health care provider or dietitian to determine how many calories you should get each day. To meet your daily calorie goal, you will need to:  Find out how many calories are in each food that you would like to eat. Try to do this before you eat.  Decide how much of the food you plan to eat.  Keep a food log. Do this by writing down what you ate and how many calories it had. To successfully lose weight, it is important to balance calorie counting with a healthy lifestyle that includes regular activity. Where do I find calorie information? The number of calories in a food can be found on a Nutrition Facts label. If a food does not have a Nutrition Facts  label, try to look up the calories online or ask your dietitian for help. Remember that calories are listed per serving. If you choose to have more than one serving of a food, you will have to multiply the calories per serving by the number of servings you plan to eat. For example, the label on a package of bread might say that a serving size is 1 slice and that there are 90 calories in a serving. If you eat 1 slice, you will have eaten 90 calories. If you eat 2 slices, you will have eaten 180 calories.   How do I keep a food log? After each time that you eat, record the following in your food log as soon as possible:  What you ate. Be sure to include toppings, sauces, and other extras on the food.  How much you ate. This can be measured in cups, ounces, or number of items.  How many calories were in each food and drink.  The total number of calories in the food you ate. Keep your food log near you, such as in a pocket-sized notebook or on an app or website on your mobile phone. Some programs will calculate calories for you and show you how many calories you have left to meet your daily goal. What are some portion-control tips?  Know how many calories are in a serving. This will   help you know how many servings you can have of a certain food.  Use a measuring cup to measure serving sizes. You could also try weighing out portions on a kitchen scale. With time, you will be able to estimate serving sizes for some foods.  Take time to put servings of different foods on your favorite plates or in your favorite bowls and cups so you know what a serving looks like.  Try not to eat straight from a food's packaging, such as from a bag or box. Eating straight from the package makes it hard to see how much you are eating and can lead to overeating. Put the amount you would like to eat in a cup or on a plate to make sure you are eating the right portion.  Use smaller plates, glasses, and bowls for smaller  portions and to prevent overeating.  Try not to multitask. For example, avoid watching TV or using your computer while eating. If it is time to eat, sit down at a table and enjoy your food. This will help you recognize when you are full. It will also help you be more mindful of what and how much you are eating. What are tips for following this plan? Reading food labels  Check the calorie count compared with the serving size. The serving size may be smaller than what you are used to eating.  Check the source of the calories. Try to choose foods that are high in protein, fiber, and vitamins, and low in saturated fat, trans fat, and sodium. Shopping  Read nutrition labels while you shop. This will help you make healthy decisions about which foods to buy.  Pay attention to nutrition labels for low-fat or fat-free foods. These foods sometimes have the same number of calories or more calories than the full-fat versions. They also often have added sugar, starch, or salt to make up for flavor that was removed with the fat.  Make a grocery list of lower-calorie foods and stick to it. Cooking  Try to cook your favorite foods in a healthier way. For example, try baking instead of frying.  Use low-fat dairy products. Meal planning  Use more fruits and vegetables. One-half of your plate should be fruits and vegetables.  Include lean proteins, such as chicken, turkey, and fish. Lifestyle Each week, aim to do one of the following:  150 minutes of moderate exercise, such as walking.  75 minutes of vigorous exercise, such as running. General information  Know how many calories are in the foods you eat most often. This will help you calculate calorie counts faster.  Find a way of tracking calories that works for you. Get creative. Try different apps or programs if writing down calories does not work for you. What foods should I eat?  Eat nutritious foods. It is better to have a nutritious,  high-calorie food, such as an avocado, than a food with few nutrients, such as a bag of potato chips.  Use your calories on foods and drinks that will fill you up and will not leave you hungry soon after eating. ? Examples of foods that fill you up are nuts and nut butters, vegetables, lean proteins, and high-fiber foods such as whole grains. High-fiber foods are foods with more than 5 g of fiber per serving.  Pay attention to calories in drinks. Low-calorie drinks include water and unsweetened drinks. The items listed above may not be a complete list of foods and beverages you can eat.   Contact a dietitian for more information.   What foods should I limit? Limit foods or drinks that are not good sources of vitamins, minerals, or protein or that are high in unhealthy fats. These include:  Candy.  Other sweets.  Sodas, specialty coffee drinks, alcohol, and juice. The items listed above may not be a complete list of foods and beverages you should avoid. Contact a dietitian for more information. How do I count calories when eating out?  Pay attention to portions. Often, portions are much larger when eating out. Try these tips to keep portions smaller: ? Consider sharing a meal instead of getting your own. ? If you get your own meal, eat only half of it. Before you start eating, ask for a container and put half of your meal into it. ? When available, consider ordering smaller portions from the menu instead of full portions.  Pay attention to your food and drink choices. Knowing the way food is cooked and what is included with the meal can help you eat fewer calories. ? If calories are listed on the menu, choose the lower-calorie options. ? Choose dishes that include vegetables, fruits, whole grains, low-fat dairy products, and lean proteins. ? Choose items that are boiled, broiled, grilled, or steamed. Avoid items that are buttered, battered, fried, or served with cream sauce. Items labeled as  crispy are usually fried, unless stated otherwise. ? Choose water, low-fat milk, unsweetened iced tea, or other drinks without added sugar. If you want an alcoholic beverage, choose a lower-calorie option, such as a glass of wine or light beer. ? Ask for dressings, sauces, and syrups on the side. These are usually high in calories, so you should limit the amount you eat. ? If you want a salad, choose a garden salad and ask for grilled meats. Avoid extra toppings such as bacon, cheese, or fried items. Ask for the dressing on the side, or ask for olive oil and vinegar or lemon to use as dressing.  Estimate how many servings of a food you are given. Knowing serving sizes will help you be aware of how much food you are eating at restaurants. Where to find more information  Centers for Disease Control and Prevention: http://www.wolf.info/  U.[place]. Department of Agriculture: http://www.wilson-mendoza.org/ Summary  Calorie counting means keeping track of how many calories you eat and drink each day. If you eat fewer calories than your body needs, you should lose weight.  A healthy amount of weight to lose per week is usually 1-2 lb (0.5-0.9 kg). This usually means reducing your daily calorie intake by 500-750 calories.  The number of calories in a food can be found on a Nutrition Facts label. If a food does not have a Nutrition Facts label, try to look up the calories online or ask your dietitian for help.  Use smaller plates, glasses, and bowls for smaller portions and to prevent overeating.  Use your calories on foods and drinks that will fill you up and not leave you hungry shortly after a meal. This information is not intended to replace advice given to you by your health care provider. Make sure you discuss any questions you have with your health care provider. Document Revised: 11/06/2019 Document Reviewed: 11/06/2019 Elsevier Patient Education  2021 [employer].   Diarrhea, Adult Diarrhea is when you pass loose and  watery poop (stool) often. Diarrhea can make you feel weak and cause you to lose water in your body (get dehydrated). Losing water in your  body can cause you to:  Feel tired and thirsty.  Have a dry mouth.  Go pee (urinate) less often. Diarrhea often lasts 2-3 days. However, it can last longer if it is a sign of something more serious. It is important to treat your diarrhea as told by your doctor. Follow these instructions at home: Eating and drinking Follow these instructions as told by your doctor:  Take an ORS (oral rehydration solution). This is a drink that helps you replace fluids and minerals your body lost. It is sold at pharmacies and stores.  Drink plenty of fluids, such as: ? Water. ? Ice chips. ? Diluted fruit juice. ? Low-calorie sports drinks. ? Milk, if you want.  Avoid drinking fluids that have a lot of sugar or caffeine in them.  Eat bland, easy-to-digest foods in small amounts as you are able. These foods include: ? Bananas. ? Applesauce. ? Rice. ? Low-fat (lean) meats. ? Toast. ? Crackers.  Avoid alcohol.  Avoid spicy or fatty foods.      Medicines  Take over-the-counter and prescription medicines only as told by your doctor.  If you were prescribed an antibiotic medicine, take it as told by your doctor. Do not stop using the antibiotic even if you start to feel better. General instructions  Wash your hands often using soap and water. If soap and water are not available, use a hand sanitizer. Others in your home should wash their hands as well. Hands should be washed: ? After using the toilet or changing a diaper. ? Before preparing, cooking, or serving food. ? While caring for a sick person. ? While visiting someone in a hospital.  Drink enough fluid to keep your pee (urine) pale yellow.  Rest at home while you get better.  Watch your condition for any changes.  Take a warm bath to help with any burning or pain from having diarrhea.  Keep  all follow-up visits as told by your doctor. This is important.   Contact a doctor if:  You have a fever.  Your diarrhea gets worse.  You have new symptoms.  You cannot keep fluids down.  You feel light-headed or dizzy.  You have a headache.  You have muscle cramps. Get help right away if:  You have chest pain.  You feel very weak or you pass out (faint).  You have bloody or black poop or poop that looks like tar.  You have very bad pain, cramping, or bloating in your belly (abdomen).  You have trouble breathing or you are breathing very quickly.  Your heart is beating very quickly.  Your skin feels cold and clammy.  You feel confused.  You have signs of losing too much water in your body, such as: ? Dark pee, very little pee, or no pee. ? Cracked lips. ? Dry mouth. ? Sunken eyes. ? Sleepiness. ? Weakness. Summary  Diarrhea is when you pass loose and watery poop (stool) often.  Diarrhea can make you feel weak and cause you to lose water in your body (get dehydrated).  Take an ORS (oral rehydration solution). This is a drink that is sold at pharmacies and stores.  Eat bland, easy-to-digest foods in small amounts as you are able.  Contact a doctor if your condition gets worse. Get help right away if you have signs that you have lost too much water in your body. This information is not intended to replace advice given to you by your health care provider. Make sure  you discuss any questions you have with your health care provider. Document Revised: 03/01/2018 Document Reviewed: 03/01/2018 Elsevier Patient Education  2021 [employer].

## 2021-01-07 NOTE — Progress Notes (Signed)
[place], [place]  [address] Phone:  [phone]   Fax:  [phone]   Established Patient Office Visit  Subjective:  Patient ID: Nancy Dickerson, female    DOB: 11/17/1980  Age: 40 y.o. MRN: [id number]  CC:  Chief Complaint  Patient presents with  . Diarrhea  . Weight Check    HPI Nancy Dickerson presents for follow up. She  has a past medical history of Anemia, Asthma, Bursitis of hip, Cardiomegaly, Chondromalacia of hip, Chondromalacia of right knee, COPD (chronic obstructive pulmonary disease) ([place]), GERD (gastroesophageal reflux disease), H/O transfusion of packed red blood cells, and Tinnitus.   Diarrhea Patient complains of diarrhea. Onset of diarrhea was 1 day ago. Diarrhea is occurring approximately 1 times per day. Patient describes diarrhea as watery. Diarrhea has been associated with no additional symptoms. . Patient denies blood in stool, fever, illness in household contacts, recent antibiotic use, recent camping, recent travel, significant abdominal pain, unintentional weight loss. Previous visits for diarrhea: none. Evaluation to date: none. Treatment to date:  kaopectate . She is ready to start weight loss medication. She was diagnosed with SVT. She is aware of the side effects of phentermine but would like to trial this anyway.   Past Medical History:  Diagnosis Date  . Anemia   . Asthma   . Bursitis of hip   . Cardiomegaly   . Chondromalacia of hip   . Chondromalacia of right knee   . COPD (chronic obstructive pulmonary disease) ([place])   . GERD (gastroesophageal reflux disease)   . H/O transfusion of packed red blood cells   . Tinnitus     Past Surgical History:  Procedure Laterality Date  . CERVICAL CONIZATION W/BX N/A 06/25/2019   Procedure: CONIZATION CERVIX WITH BIOPSY - COLD KNIFE;  Surgeon: Emily Filbert, MD;  Location: [facility];  Service: Gynecology;  Laterality: N/A;  . ENDOMETRIAL  ABLATION N/A 06/25/2019   Procedure: MINERVA ABLATION;  Surgeon: Emily Filbert, MD;  Location: [facility];  Service: Gynecology;  Laterality: N/A;  [employer] rep will here confirmed on 06/20/19 CS  . HYSTEROSCOPY WITH D & C  06/25/2019   Procedure: DILATATION AND CURETTAGE /HYSTEROSCOPY;  Surgeon: Emily Filbert, MD;  Location: [facility];  Service: Gynecology;;  . NO PAST SURGERIES      Family History  Problem Relation Age of Onset  . Sudden death Father 78    Social History   Socioeconomic History  . Marital status: Single    Spouse name: Not on file  . Number of children: 6  . Years of education: 41  . Highest education level: Not on file  Occupational History  . 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 Smoking status: Never Smoker  . Smokeless tobacco: Never Used  Vaping Use  . Vaping Use: Never used  Substance and Sexual Activity  . Alcohol use: Yes    Comment: rare  . Drug use: Yes    Types: Marijuana  . Sexual activity: Not on file  Other Topics Concern  . Not on file  Social History Narrative   ** Merged History Encounter **         Lives with someone   Right handed    Little caffeine - drinks Sprite   Education - some college          Social Determinants of Health   Financial Resource Strain:  Not on file  Food Insecurity: No Food Insecurity  . Worried About Charity fundraiser in the Last Year: Never true  . Ran Out of Food in the Last Year: Never true  Transportation Needs: No Transportation Needs  . Lack of Transportation (Medical): No  . Lack of Transportation (Non-Medical): No  Physical Activity: Not on file  Stress: Not on file  Social Connections: Not on file  Intimate Partner Violence: Not on file    Outpatient Medications Prior to Visit  Medication Sig Dispense Refill  . fluticasone (FLOVENT HFA) 110 MCG/ACT inhaler Inhale into the lungs 2 (two) times daily.    Marland Kitchen ibuprofen (ADVIL) 800 MG tablet Take 1 tablet (800 mg  total) by mouth 3 (three) times daily. 21 tablet 0  . DULoxetine (CYMBALTA) 30 MG capsule Take 1 capsule (30 mg total) by mouth daily. 30 capsule 2  . hydrOXYzine (ATARAX/VISTARIL) 10 MG tablet Take 1 tablet (10 mg total) by mouth 3 (three) times daily as needed. 90 tablet 2  . traZODone (DESYREL) 50 MG tablet Take 2 tablets (100 mg total) by mouth at bedtime as needed for sleep. 60 tablet 2  . metoprolol tartrate (LOPRESSOR) 25 MG tablet Take 1 tablet (25 mg total) by mouth 2 (two) times daily. (Patient not taking: Reported on 01/07/2021) 180 tablet 3  . doxycycline (VIBRAMYCIN) 100 MG capsule Take 1 capsule (100 mg total) by mouth 2 (two) times daily. 20 capsule 0   No facility-administered medications prior to visit.    No Known Allergies  ROS Review of Systems  Constitutional: Negative for chills and fatigue.  Gastrointestinal: Positive for diarrhea (watery stool with green ). Negative for nausea.      Objective:    Physical Exam Constitutional:      Appearance: She is obese.  HENT:     Head: Normocephalic.  Skin:    General: Skin is warm and dry.     Capillary Refill: Capillary refill takes less than 2 seconds.  Neurological:     General: No focal deficit present.     Mental Status: She is alert and oriented to person, place, and time.  Psychiatric:        Mood and Affect: Mood normal.        Behavior: Behavior normal.        Thought Content: Thought content normal.     BP 128/79   Pulse 99   Ht 5\' 4"  (1.626 m)   Wt 263 lb (119.3 kg)   LMP  (LMP Unknown)   SpO2 100%   BMI 45.14 kg/m  Wt Readings from Last 3 Encounters:  01/07/21 263 lb (119.3 kg)  11/29/20 263 lb 3.2 oz (119.4 kg)  11/26/20 260 lb (117.9 kg)     There are no preventive care reminders to display for this patient.  There are no preventive care reminders to display for this patient.  Lab Results  Component Value Date   TSH 0.768 07/08/2018   Lab Results  Component Value Date   WBC 8.2  10/27/2020   HGB 9.7 (L) 10/27/2020   HCT 30.2 (L) 10/27/2020   MCV 81.6 10/27/2020   PLT 392 10/27/2020   Lab Results  Component Value Date   NA 135 10/27/2020   K 3.6 10/27/2020   CO2 24 10/27/2020   GLUCOSE 94 10/27/2020   BUN 8 10/27/2020   CREATININE 0.83 10/27/2020   BILITOT 0.6 05/13/2019   ALKPHOS 46 05/13/2019   AST 19 05/13/2019  ALT 21 05/13/2019   PROT 7.6 05/13/2019   ALBUMIN 4.0 05/13/2019   CALCIUM 9.4 10/27/2020   ANIONGAP 11 10/27/2020   Lab Results  Component Value Date   CHOL 115 01/30/2018   Lab Results  Component Value Date   HDL 39 (L) 01/30/2018   Lab Results  Component Value Date   LDLCALC 58 01/30/2018   Lab Results  Component Value Date   TRIG 89 01/30/2018   Lab Results  Component Value Date   CHOLHDL 2.9 01/30/2018   Lab Results  Component Value Date   HGBA1C 5.7 (H) 06/03/2020      Assessment & Plan:   Problem List Items Addressed This Visit   None   Visit Diagnoses    Diarrhea, unspecified type    -  Primary Current  Pepto Bismol or kaopectate after 24 hours due to virus symptoms.    Work-related stress     Persistent looking for new job   Class 3 severe obesity due to excess calories without serious comorbidity with body mass index (BMI) of 45.0 to 49.9 in adult [employer])     Worsening trial phentermine per patient request to help just start weight loss.    Relevant Medications   phentermine 37.5 MG capsule      Meds ordered this encounter  Medications  . phentermine 37.5 MG capsule    Sig: Take 1 capsule (37.5 mg total) by mouth every morning.    Dispense:  30 capsule    Refill:  0    Order Specific Question:   Supervising Provider    Answer:   Tresa Garter [[id number]]    Follow-up: Return in about 4 weeks (around 02/04/2021).    Vevelyn Francois, NP

## 2021-01-12 ENCOUNTER — Telehealth (INDEPENDENT_AMBULATORY_CARE_PROVIDER_SITE_OTHER): Payer: Medicaid Other | Admitting: Psychiatry

## 2021-01-12 ENCOUNTER — Other Ambulatory Visit: Payer: Self-pay

## 2021-01-12 ENCOUNTER — Encounter (HOSPITAL_COMMUNITY): Payer: Self-pay | Admitting: Psychiatry

## 2021-01-12 ENCOUNTER — Telehealth (HOSPITAL_COMMUNITY): Payer: Self-pay

## 2021-01-12 DIAGNOSIS — F331 Major depressive disorder, recurrent, moderate: Secondary | ICD-10-CM

## 2021-01-12 DIAGNOSIS — F411 Generalized anxiety disorder: Secondary | ICD-10-CM | POA: Diagnosis not present

## 2021-01-12 MED ORDER — HYDROXYZINE HCL 50 MG PO TABS
50.0000 mg | ORAL_TABLET | Freq: Two times a day (BID) | ORAL | 2 refills | Status: DC | PRN
  Filled 2021-01-12: qty 60, 30d supply, fill #0

## 2021-01-12 MED ORDER — DULOXETINE HCL 40 MG PO CPEP
40.0000 mg | ORAL_CAPSULE | Freq: Every day | ORAL | 2 refills | Status: DC
  Filled 2021-01-12 – 2021-01-13 (×2): qty 30, 30d supply, fill #0

## 2021-01-12 NOTE — Telephone Encounter (Signed)
Patient called and stated that she and you had talked about medication for focusing. She would like to try something. Please review and advise. Thank you
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
## 2021-01-13 ENCOUNTER — Other Ambulatory Visit: Payer: Self-pay

## 2021-01-13 ENCOUNTER — Ambulatory Visit: Payer: Self-pay | Admitting: Nurse Practitioner

## 2021-01-13 ENCOUNTER — Other Ambulatory Visit (HOSPITAL_COMMUNITY): Payer: Self-pay | Admitting: Psychiatry

## 2021-01-13 DIAGNOSIS — F902 Attention-deficit hyperactivity disorder, combined type: Secondary | ICD-10-CM | POA: Insufficient documentation

## 2021-01-13 DIAGNOSIS — F331 Major depressive disorder, recurrent, moderate: Secondary | ICD-10-CM

## 2021-01-13 DIAGNOSIS — F411 Generalized anxiety disorder: Secondary | ICD-10-CM

## 2021-01-13 MED ORDER — DULOXETINE HCL 40 MG PO CPEP
40.0000 mg | ORAL_CAPSULE | Freq: Every day | ORAL | 2 refills | Status: DC
  Filled 2021-01-13: qty 30, 30d supply, fill #0

## 2021-01-13 MED ORDER — ATOMOXETINE HCL 40 MG PO CAPS
40.0000 mg | ORAL_CAPSULE | Freq: Every day | ORAL | 2 refills | Status: DC
Start: 2021-01-13 — End: 2021-02-15

## 2021-01-13 NOTE — Telephone Encounter (Signed)
Provider called patient who notes that she has been having difficulty focusing, disorganization, avoidance of mentally taxing task, poor concentration, and difficulty listening.  She notes that it is difficult for her to read a book or complete task.  Patient agreeable to start Strattera 40 mg daily to help manage symptoms of ADHD.  Potential side effects of medication and risks vs benefits of treatment vs non-treatment were explained and discussed. All questions were answered. She will continue all other medications described.  No other concerns noted at this time.

## 2021-01-17 NOTE — Progress Notes (Deleted)
Cardiology Clinic Note   Patient Name: Nancy Dickerson Date of Encounter: 01/18/2021  Primary Care Provider:  Vevelyn Francois, NP Primary Cardiologist:  Minus Breeding, MD  Patient Profile    Nancy Dickerson 40 year old female presents the clinic today for follow-up evaluation of her tachycardia.  Past Medical History    Past Medical History:  Diagnosis Date  . Anemia   . Asthma   . Bursitis of hip   . Cardiomegaly   . Chondromalacia of hip   . Chondromalacia of right knee   . COPD (chronic obstructive pulmonary disease) ([place])   . GERD (gastroesophageal reflux disease)   . H/O transfusion of packed red blood cells   . Tinnitus    Past Surgical History:  Procedure Laterality Date  . CERVICAL CONIZATION W/BX N/A 06/25/2019   Procedure: CONIZATION CERVIX WITH BIOPSY - COLD KNIFE;  Surgeon: Emily Filbert, MD;  Location: [facility];  Service: Gynecology;  Laterality: N/A;  . ENDOMETRIAL ABLATION N/A 06/25/2019   Procedure: MINERVA ABLATION;  Surgeon: Emily Filbert, MD;  Location: [facility];  Service: Gynecology;  Laterality: N/A;  Minerva rep will here confirmed on 06/20/19 CS  . HYSTEROSCOPY WITH D & C  06/25/2019   Procedure: DILATATION AND CURETTAGE /HYSTEROSCOPY;  Surgeon: Emily Filbert, MD;  Location: [facility];  Service: Gynecology;;  . NO PAST SURGERIES      Allergies  No Known Allergies  History of Present Illness    [name] is a PMH of tachycardia and shortness of breath.  She was seen by Dr. Percival Spanish on 11/29/2020.  During that time she has been referred for evaluation of her tachycardia by her PCP.  She denied history of chest pain.  Her echocardiogram had shown 50-55% EF.  She also described a stress test that was not a treadmill type test.  During that time she was told that she had poor inspiratory effort.  These results were not attainable.  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  She also  reported chronic anemia.  She presented to the emergency room 1/22.  She tested positive for COVID-19 at that time.  She reported that it has been her second bout of Covid.  She was initially seen in urgent care and her heart rate was in the 140s.  Her EKG showed a heart rate of 137 and sinus tachycardia.  Another EKG was reviewed and showed sinus tachycardia in the 120s.  She reported that the sensation felt like her heart was fluttering.  She reported she was limited with her walking secondary to her increased heart rate and shortness of breath.  She also reported mild chest tightness.  She denied presyncope and syncope.  She denied orthopnea and PND.  She denied cough fever and chills.  She wore a cardiac event monitor and the results on 12/30/2020 showed a minimum heart rate of 60 with a maximum heart rate of 292 and an average heart rate of 89.  She was predominantly in sinus rhythm.  She did however have 1 episode of SVT that lasted 13.1 seconds.  Metoprolol 25 mg twice daily was added to her medication regimen.  She presents the clinic today for follow-up evaluation states***  *** denies chest pain, shortness of breath, lower extremity edema, fatigue, palpitations, melena, hematuria, hemoptysis, diaphoresis, weakness, presyncope, syncope, orthopnea, and PND.   Home Medications    Prior to Admission medications  Medication Sig Start Date End Date Taking? Authorizing Provider  atomoxetine (STRATTERA) 40 MG capsule Take 1 capsule (40 mg total) by mouth daily. 01/13/21   Salley Slaughter, NP  DULoxetine HCl 40 MG CPEP Take 40 mg by mouth daily. 01/13/21 01/13/22  Salley Slaughter, NP  fluticasone (FLOVENT HFA) 110 MCG/ACT inhaler Inhale into the lungs 2 (two) times daily.    Joline Salt, RN  hydrOXYzine (ATARAX/VISTARIL) 50 MG tablet Take 1 tablet (50 mg total) by mouth 2 (two) times daily as needed for anxiety (Sleep). 01/12/21   Salley Slaughter, NP  ibuprofen (ADVIL) 800 MG tablet Take 1  tablet (800 mg total) by mouth 3 (three) times daily. 12/13/20   Hughie Closs, PA-C  metoprolol tartrate (LOPRESSOR) 25 MG tablet Take 1 tablet (25 mg total) by mouth 2 (two) times daily. Patient not taking: Reported on 01/07/2021 12/31/20 03/31/21  Minus Breeding, MD  phentermine 37.5 MG capsule Take 1 capsule (37.5 mg total) by mouth every morning. 01/07/21 02/06/21  Vevelyn Francois, NP    Family History    Family History  Problem Relation Age of Onset  . Sudden death Father 64   She indicated that her mother is alive. She indicated that her father is deceased.  Social History    Social History   Socioeconomic History  . Marital status: Single    Spouse name: Not on file  . Number of children: 6  . Years of education: 58  . Highest education level: Not on file  Occupational History  . 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 Smoking status: Never Smoker  . Smokeless tobacco: Never Used  Vaping Use  . Vaping Use: Never used  Substance and Sexual Activity  . Alcohol use: Yes    Comment: rare  . Drug use: Yes    Types: Marijuana  . Sexual activity: Not on file  Other Topics Concern  . Not on file  Social History Narrative   ** Merged History Encounter **         Lives with someone   Right handed    Little caffeine - drinks Sprite   Education - some college          Social Determinants of Health   Financial Resource Strain: Not on file  Food Insecurity: No Food Insecurity  . Worried About Charity fundraiser in the Last Year: Never true  . Ran Out of Food in the Last Year: Never true  Transportation Needs: No Transportation Needs  . Lack of Transportation (Medical): No  . Lack of Transportation (Non-Medical): No  Physical Activity: Not on file  Stress: Not on file  Social Connections: Not on file  Intimate Partner Violence: Not on file     Review of Systems    General:  No chills, fever, night sweats or weight changes.  Cardiovascular:  No chest pain, dyspnea  on exertion, edema, orthopnea, palpitations, paroxysmal nocturnal dyspnea. Dermatological: No rash, lesions/masses Respiratory: No cough, dyspnea Urologic: No hematuria, dysuria Abdominal:   No nausea, vomiting, diarrhea, bright red blood per rectum, melena, or hematemesis Neurologic:  No visual changes, wkns, changes in mental status. All other systems reviewed and are otherwise negative except as noted above.  Physical Exam    VS:  There were no vitals taken for this visit. , BMI There is no height or weight on file to calculate BMI. GEN: Well nourished, well developed, in no acute distress. HEENT: normal. Neck:  Supple, no JVD, carotid bruits, or masses. Cardiac: RRR, no murmurs, rubs, or gallops. No clubbing, cyanosis, edema.  Radials/DP/PT 2+ and equal bilaterally.  Respiratory:  Respirations regular and unlabored, clear to auscultation bilaterally. GI: Soft, nontender, nondistended, BS + x 4. MS: no deformity or atrophy. Skin: warm and dry, no rash. Neuro:  Strength and sensation are intact. Psych: Normal affect.  Accessory Clinical Findings    Recent Labs: 10/27/2020: BUN 8; Creatinine, Ser 0.83; Hemoglobin 9.7; Platelets 392; Potassium 3.6; Sodium 135   Recent Lipid Panel    Component Value Date/Time   CHOL 115 01/30/2018 0355   TRIG 89 01/30/2018 0355   HDL 39 (L) 01/30/2018 0355   CHOLHDL 2.9 01/30/2018 0355   VLDL 18 01/30/2018 0355   LDLCALC 58 01/30/2018 0355    ECG personally reviewed by me today- *** - No acute changes  Cardiac event monitor 12/30/2020  Patch Wear Time: 5 days and 20 hours (2022-03-01T00:34:06-499 to 2022-03-06T21:05:32-0500) Patient had a min HR of 60 bpm, max HR of 292 bpm, and avg HR of 89 bpm. Predominant underlying rhythm was Sinus Rhythm. 1 run of Supraventricular Tachycardia occurred lasting 13.1 secs with a max rate of 292 bpm (avg 257 bpm). Isolated SVEs were rare (<1.0%), SVE Triplets were rare (<1.0%), and no SVE Couplets were  present. Isolated VEs were rare (<1.0%), and no VE Couplets or VE Triplets were present. Ventricular Bigeminy and Trigeminy were present.   Normal sinus rhythm Supraventricular tachycardia with fastest rate of 292. Run of SVT lasted 13.1 seconds.   Ventricular triplets, bigeminy and trigeminy noted.     Assessment & Plan   1.  Tachycardia/palpitations-no recent episodes of increased heart rate or irregular heartbeats.  Wore cardiac event monitor 12/30/2020 which showed predominantly normal sinus rhythm with an episode of SVT.  She was started on metoprolol at that time. Continue metoprolol Heart healthy low-sodium diet-salty 6 given Increase physical activity as tolerated Avoid triggers caffeine, chocolate, EtOH, dehydration etc.  Shortness of breath-resolved.  A stress test was recommended for further evaluation however, it was not completed.  She has increased physical activity and longer has any increased shortness of breath with normal activities. Continue to monitor  Disposition: Follow-up with Dr. Percival Spanish in 6 months.   Jossie Ng. Taytum Scheck NP-C    01/18/2021, 12:50 PM [place] [address] Office [phone] Fax [phone]  Notice: This dictation was prepared with Dragon dictation along with smaller phrase technology. Any transcriptional errors that result from this process are unintentional and may not be corrected upon review.  I spent***minutes examining this patient, reviewing medications, and using patient centered shared decision making involving her cardiac care.  Prior to her visit I spent greater than 20 minutes reviewing her past medical history,  medications, and prior cardiac tests.

## 2021-01-19 ENCOUNTER — Ambulatory Visit: Payer: Medicaid Other | Admitting: General Practice

## 2021-01-20 ENCOUNTER — Telehealth (HOSPITAL_COMMUNITY): Payer: Self-pay | Admitting: Cardiology

## 2021-01-20 NOTE — Telephone Encounter (Signed)
Patient not able to schedule GXT due to she is not able to get off work. Patient stated that she would discuss with MD at appt on 01/19/21 but patient no showed the Dr. Hilaria Ota. Order will be removed from the [facility] and if patient calls back to reschedule we will reinstate the order. Thank you

## 2021-01-24 ENCOUNTER — Other Ambulatory Visit: Payer: Self-pay

## 2021-02-01 ENCOUNTER — Telehealth (HOSPITAL_COMMUNITY): Payer: Self-pay | Admitting: Psychiatry

## 2021-02-01 ENCOUNTER — Other Ambulatory Visit (HOSPITAL_COMMUNITY): Payer: Self-pay | Admitting: Psychiatry

## 2021-02-01 DIAGNOSIS — F331 Major depressive disorder, recurrent, moderate: Secondary | ICD-10-CM

## 2021-02-01 MED ORDER — ARIPIPRAZOLE 5 MG PO TABS: 5.0000 mg | ORAL_TABLET | Freq: Every day | ORAL | 2 refills | Status: DC

## 2021-02-01 MED ORDER — TRAZODONE HCL 50 MG PO TABS
50.0000 mg | ORAL_TABLET | Freq: Every evening | ORAL | 2 refills | Status: DC | PRN
Start: 2021-02-01 — End: 2021-02-15
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
## 2021-02-01 NOTE — Telephone Encounter (Signed)
Patient contacted office to speak with provider, Dr. Parsons. She stated that she is taking several medications for heart care, weight loss, and mental health. She states that medications may not be working well together because she is more depressed. Patient requested call from provider or nursing staff to discuss this concern.  

## 2021-02-01 NOTE — Telephone Encounter (Signed)
Patient contacted office to speak with provider, Dr. Ronne Binning. She stated that she is taking several medications for heart care, weight loss, and mental health. She states that medications may not be working well together because she is more depressed. Patient requested call from provider or nursing staff to discuss this concern.

## 2021-02-04 ENCOUNTER — Other Ambulatory Visit: Payer: Self-pay

## 2021-02-04 ENCOUNTER — Encounter: Payer: Self-pay | Admitting: Nurse Practitioner

## 2021-02-04 ENCOUNTER — Ambulatory Visit (INDEPENDENT_AMBULATORY_CARE_PROVIDER_SITE_OTHER): Payer: Medicaid Other | Admitting: Nurse Practitioner

## 2021-02-04 ENCOUNTER — Other Ambulatory Visit: Payer: Self-pay | Admitting: Nurse Practitioner

## 2021-02-04 VITALS — BP 139/89 | HR 93 | Temp 98.2°F | Ht 64.0 in | Wt 258.2 lb

## 2021-02-04 DIAGNOSIS — Z6841 Body Mass Index (BMI) 40.0 and over, adult: Secondary | ICD-10-CM

## 2021-02-04 MED ORDER — CONTRAVE 8-90 MG PO TB12: ORAL_TABLET | ORAL | 0 refills | Status: DC

## 2021-02-04 NOTE — Progress Notes (Signed)
[place], [place]  [address] Phone:  [phone]   Fax:  [phone]   Established Patient Office Visit  Subjective:  Patient ID: Nancy Dickerson, female    DOB: 12-26-1980  Age: 40 y.o. MRN: [id number]  CC:  Chief Complaint  Patient presents with  . Follow-up    4 week follow up , having blood , diet pills didn't work causes her be depress     HPI Stefani BLESSEN KIMBROUGH presents for follow up. She  has a past medical history of Anemia, Asthma, Bursitis of hip, Cardiomegaly, Chondromalacia of hip, Chondromalacia of right knee, COPD (chronic obstructive pulmonary disease) ([place]), GERD (gastroesophageal reflux disease), H/O transfusion of packed red blood cells, and Tinnitus.   She is follow up for weight management. She was started on Phentermine per her request and this has not been effective for her. She feels like it has made her depressed.  However her weight is down 5 pounds in the last four weeks and this from increased stress domestic and work related stress.   She has elevation in her BP which may be related to stress.  She has a history of tachycardia and was prescribed metoprolol tartrate 25 mg twice daily.  She is not taking.  She reports not liking to take medications.   Past Medical History:  Diagnosis Date  . Anemia   . Asthma   . Bursitis of hip   . Cardiomegaly   . Chondromalacia of hip   . Chondromalacia of right knee   . COPD (chronic obstructive pulmonary disease) ([place])   . GERD (gastroesophageal reflux disease)   . H/O transfusion of packed red blood cells   . Tinnitus     Past Surgical History:  Procedure Laterality Date  . CERVICAL CONIZATION W/BX N/A 06/25/2019   Procedure: CONIZATION CERVIX WITH BIOPSY - COLD KNIFE;  Surgeon: Emily Filbert, MD;  Location: [facility];  Service: Gynecology;  Laterality: N/A;  . ENDOMETRIAL ABLATION N/A 06/25/2019   Procedure: MINERVA ABLATION;  Surgeon: Emily Filbert,  MD;  Location: [facility];  Service: Gynecology;  Laterality: N/A;  Minerva rep will here confirmed on 06/20/19 CS  . HYSTEROSCOPY WITH D & C  06/25/2019   Procedure: DILATATION AND CURETTAGE /HYSTEROSCOPY;  Surgeon: Emily Filbert, MD;  Location: [facility];  Service: Gynecology;;  . NO PAST SURGERIES      Family History  Problem Relation Age of Onset  . Sudden death Father 87    Social History   Socioeconomic History  . Marital status: Single    Spouse name: Not on file  . Number of children: 6  . Years of education: 33  . Highest education level: Not on file  Occupational History  . 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 Smoking status: Never Smoker  . Smokeless tobacco: Never Used  Vaping Use  . Vaping Use: Never used  Substance and Sexual Activity  . Alcohol use: Yes    Comment: rare  . Drug use: Yes    Types: Marijuana  . Sexual activity: Not on file  Other Topics Concern  . Not on file  Social History Narrative   ** Merged History Encounter **         Lives with someone   Right handed    Little caffeine - drinks Sprite   Education - some college  Social Determinants of Health   Financial Resource Strain: Not on file  Food Insecurity: No Food Insecurity  . Worried About Charity fundraiser in the Last Year: Never true  . Ran Out of Food in the Last Year: Never true  Transportation Needs: No Transportation Needs  . Lack of Transportation (Medical): No  . Lack of Transportation (Non-Medical): No  Physical Activity: Not on file  Stress: Not on file  Social Connections: Not on file  Intimate Partner Violence: Not on file    Outpatient Medications Prior to Visit  Medication Sig Dispense Refill  . fluticasone (FLOVENT HFA) 110 MCG/ACT inhaler Inhale into the lungs 2 (two) times daily.    . ARIPiprazole (ABILIFY) 5 MG tablet Take 1 tablet (5 mg total) by mouth daily. (Patient not taking: Reported on 02/04/2021) 30 tablet 2   . atomoxetine (STRATTERA) 40 MG capsule Take 1 capsule (40 mg total) by mouth daily. (Patient not taking: Reported on 02/04/2021) 30 capsule 2  . DULoxetine HCl 40 MG CPEP Take 40 mg by mouth daily. (Patient not taking: Reported on 02/04/2021) 30 capsule 2  . hydrOXYzine (ATARAX/VISTARIL) 50 MG tablet Take 1 tablet (50 mg total) by mouth 2 (two) times daily as needed for anxiety (Sleep). (Patient not taking: Reported on 02/04/2021) 60 tablet 2  . ibuprofen (ADVIL) 800 MG tablet Take 1 tablet (800 mg total) by mouth 3 (three) times daily. (Patient not taking: Reported on 02/04/2021) 21 tablet 0  . metoprolol tartrate (LOPRESSOR) 25 MG tablet Take 1 tablet (25 mg total) by mouth 2 (two) times daily. (Patient not taking: No sig reported) 180 tablet 3  . phentermine 37.5 MG capsule Take 1 capsule (37.5 mg total) by mouth every morning. (Patient not taking: Reported on 02/04/2021) 30 capsule 0  . traZODone (DESYREL) 50 MG tablet Take 1 tablet (50 mg total) by mouth at bedtime as needed for sleep. (Patient not taking: Reported on 02/04/2021) 30 tablet 2   No facility-administered medications prior to visit.    No Known Allergies  ROS Review of Systems    Objective:    Physical Exam Constitutional:      Appearance: She is obese.  HENT:     Head: Normocephalic and atraumatic.  Cardiovascular:     Rate and Rhythm: Normal rate and regular rhythm.     Pulses: Normal pulses.  Pulmonary:     Breath sounds: Normal breath sounds.  Abdominal:     Palpations: Abdomen is soft.  Musculoskeletal:     Cervical back: Normal range of motion.     Right lower leg: No edema.     Left lower leg: No edema.  Skin:    General: Skin is warm and dry.     Capillary Refill: Capillary refill takes less than 2 seconds.  Neurological:     General: No focal deficit present.     Mental Status: She is alert and oriented to person, place, and time.  Psychiatric:        Mood and Affect: Mood normal.        Behavior:  Behavior normal.        Thought Content: Thought content normal.        Judgment: Judgment normal.     BP 139/89   Pulse 93   Temp 98.2 F (36.8 C) (Temporal)   Ht 5\' 4"  (1.626 m)   Wt 258 lb 3.2 oz (117.1 kg)   SpO2 98%   BMI 44.32 kg/m  Wt  Readings from Last 3 Encounters:  02/04/21 258 lb 3.2 oz (117.1 kg)  01/07/21 263 lb (119.3 kg)  11/29/20 263 lb 3.2 oz (119.4 kg)     Health Maintenance Due  Topic Date Due  . COVID-19 Vaccine (1) Never done    There are no preventive care reminders to display for this patient.  Lab Results  Component Value Date   TSH 0.768 07/08/2018   Lab Results  Component Value Date   WBC 8.2 10/27/2020   HGB 9.7 (L) 10/27/2020   HCT 30.2 (L) 10/27/2020   MCV 81.6 10/27/2020   PLT 392 10/27/2020   Lab Results  Component Value Date   NA 135 10/27/2020   K 3.6 10/27/2020   CO2 24 10/27/2020   GLUCOSE 94 10/27/2020   BUN 8 10/27/2020   CREATININE 0.83 10/27/2020   BILITOT 0.6 05/13/2019   ALKPHOS 46 05/13/2019   AST 19 05/13/2019   ALT 21 05/13/2019   PROT 7.6 05/13/2019   ALBUMIN 4.0 05/13/2019   CALCIUM 9.4 10/27/2020   ANIONGAP 11 10/27/2020   Lab Results  Component Value Date   CHOL 115 01/30/2018   Lab Results  Component Value Date   HDL 39 (L) 01/30/2018   Lab Results  Component Value Date   LDLCALC 58 01/30/2018   Lab Results  Component Value Date   TRIG 89 01/30/2018   Lab Results  Component Value Date   CHOLHDL 2.9 01/30/2018   Lab Results  Component Value Date   HGBA1C 5.7 (H) 06/03/2020      Assessment & Plan:   Problem List Items Addressed This Visit      Other   Class 3 severe obesity due to excess calories without serious comorbidity with body mass index (BMI) of 40.0 to 44.9 in adult ([place]) - Primary Persistent 5 pound weight loss Discontinue phentermine due to side effects Initiated Contrave with tapering dose   Relevant Medications   Naltrexone-buPROPion HCl ER (CONTRAVE) 8-90 MG TB12       Meds ordered this encounter  Medications  . Naltrexone-buPROPion HCl ER (CONTRAVE) 8-90 MG TB12    Sig: 1 tab BID x7; 1 tab BID x 7; 2 tab am and 1 pm x 7; and 2 tabs BID until    Dispense:  120 tablet    Refill:  0    Order Specific Question:   Supervising Provider    Answer:   Tresa Garter [[id number]]    Follow-up: Return in about 4 weeks (around 03/04/2021) for Follow-up for weight management [id number] virtual appointment okay.    Vevelyn Francois, NP

## 2021-02-04 NOTE — Patient Instructions (Signed)
Bupropion; Naltrexone extended-release tablets What is this medicine? BUPROPION; NALTREXONE (byoo PROE pee on; nal TREX one) is a combination of two drugs that help you lose weight. This product is used with a reduced calorie diet and exercise. This product can also help you maintain weight loss. This medicine may be used for other purposes; ask your health care provider or pharmacist if you have questions. COMMON BRAND NAME(S): Contrave What should I tell my health care provider before I take this medicine? They need to know if you have any of these conditions:  an eating disorder, such as anorexia or bulimia  diabetes  depression  glaucoma  head injury  heart disease  high blood pressure  history of drug abuse or alcohol abuse problem  history of a tumor or infection of your brain or spine  history of heart attack or stroke  history of irregular heartbeat  if you often drink alcohol  kidney disease  liver disease  low levels of sodium in the blood  mental illness  seizures  suicidal thoughts, plans, or attempt; a previous suicide attempt by you or a family member  taken an MAOI like Carbex, Eldepryl, Marplan, Nardil, or Parnate in last 14 days  an unusual or allergic reaction to bupropion, naltrexone, other medicines, foods, dyes, or preservatives  breast-feeding  pregnant or trying to become pregnant How should I use this medicine? Take this medicine by mouth with a glass of water. Follow the directions on the prescription label. Do not cut, crush or chew this medicine. Swallow the tablets whole. You can take it with or without food. Do not take with high-fat meals as this may increase your risk of seizures. Take your medicine at regular intervals. Do not take it more often than directed. Do not stop taking except on your doctor's advice. A special MedGuide will be given to you by the pharmacist with each prescription and refill. Be sure to read this  information carefully each time. Talk to your pediatrician regarding the use of this medicine in children. Special care may be needed. Overdosage: If you think you have taken too much of this medicine contact a poison control center or emergency room at once. NOTE: This medicine is only for you. Do not share this medicine with others. What if I miss a dose? If you miss a dose, skip it. Take your next dose at the normal time. Do not take extra or 2 doses at the same time to make up for the missed dose. What may interact with this medicine? Do not take this medicine with any of the following medications:  any medicines used to stop taking opioids such as methadone or buprenorphine  linezolid  MAOIs like Carbex, Eldepryl, Marplan, Nardil, and Parnate  methylene blue (injected into a vein)  often take narcotic medicines for pain or cough  other medicines that contain bupropion like Zyban or Wellbutrin This medicine may also interact with the following medications:  alcohol  certain medicines for blood pressure like metoprolol, propranolol  certain medicines for depression, anxiety, or psychotic disturbances  certain medicines for HIV or hepatitis  certain medicines for irregular heart beat like propafenone, flecainide  certain medicines for Parkinson's disease like amantadine, levodopa  certain medicines for seizures like carbamazepine, phenytoin, phenobarbital  certain medicines for sleep  cimetidine  clopidogrel  cyclophosphamide  digoxin  disulfiram  furazolidone  isoniazid  nicotine  orphenadrine  procarbazine  steroid medicines like prednisone or cortisone  stimulant medicines for attention disorders,   weight loss, or to stay awake  tamoxifen  theophylline  thiotepa  ticlopidine  tramadol  warfarin This list may not describe all possible interactions. Give your health care provider a list of all the medicines, herbs, non-prescription drugs, or  dietary supplements you use. Also tell them if you smoke, drink alcohol, or use illegal drugs. Some items may interact with your medicine. What should I watch for while using this medicine? Visit your doctor or healthcare provider for regular checks on your progress. This medicine may cause serious skin reactions. They can happen weeks to months after starting the medicine. Contact your healthcare provider right away if you notice fevers or flu-like symptoms with a rash. The rash may be red or purple and then turn into blisters or peeling of the skin. Or, you might notice a red rash with swelling of the face, lips or lymph nodes in your neck or under your arms. This medicine may affect blood sugar. Ask your healthcare provider if changes in diet or medicines are needed if you have diabetes. Patients and their families should watch out for new or worsening depression or thoughts of suicide. Also watch out for sudden changes in feelings such as feeling anxious, agitated, panicky, irritable, hostile, aggressive, impulsive, severely restless, overly excited and hyperactive, or not being able to sleep. If this happens, especially at the beginning of treatment or after a change in dose, call your healthcare provider. Avoid alcoholic drinks while taking this medicine. Drinking large amounts of alcoholic beverages, using sleeping or anxiety medicines, or quickly stopping the use of these agents while taking this medicine may increase your risk for a seizure. Do not drive or use heavy machinery until you know how this medicine affects you. This medicine can impair your ability to perform these tasks. Women should inform their health care provider if they wish to become pregnant or think they might be pregnant. Losing weight while pregnant is not advised and may cause harm to the unborn child. Talk to your health care provider for more information. What side effects may I notice from receiving this medicine? Side  effects that you should report to your doctor or health care professional as soon as possible:  allergic reactions like skin rash, itching or hives, swelling of the face, lips, or tongue  breathing problems  changes in vision  confusion  elevated mood, decreased need for sleep, racing thoughts, impulsive behavior  fast or irregular heartbeat  hallucinations, loss of contact with reality  increased blood pressure  rash, fever, and swollen lymph nodes  redness, blistering, peeling, or loosening of the skin, including inside the mouth  seizures  signs and symptoms of liver injury like dark yellow or brown urine; general ill feeling or flu-like symptoms; light-colored stools; loss of appetite; nausea; right upper belly pain; unusually weak or tired; yellowing of the eyes or skin  suicidal thoughts or other mood changes  vomiting Side effects that usually do not require medical attention (report to your doctor or health care professional if they continue or are bothersome):  constipation  headache  loss of appetite  indigestion, stomach upset  tremors This list may not describe all possible side effects. Call your doctor for medical advice about side effects. You may report side effects to FDA at 1-800-FDA-1088. Where should I keep my medicine? Keep out of the reach of children. Store at room temperature between 15 and 30 degrees C (59 and 86 degrees F). Throw away any unused medicine after the   expiration date. NOTE: This sheet is a summary. It may not cover all possible information. If you have questions about this medicine, talk to your doctor, pharmacist, or health care provider.  2021 Elsevier/Gold Standard (2019-08-01 16:26:28)  

## 2021-02-14 ENCOUNTER — Ambulatory Visit: Payer: Medicaid Other | Admitting: Obstetrics and Gynecology

## 2021-02-15 ENCOUNTER — Telehealth (INDEPENDENT_AMBULATORY_CARE_PROVIDER_SITE_OTHER): Payer: Medicaid Other | Admitting: Psychiatry

## 2021-02-15 ENCOUNTER — Encounter (HOSPITAL_COMMUNITY): Payer: Self-pay | Admitting: Psychiatry

## 2021-02-15 ENCOUNTER — Other Ambulatory Visit: Payer: Self-pay

## 2021-02-15 DIAGNOSIS — F411 Generalized anxiety disorder: Secondary | ICD-10-CM | POA: Diagnosis not present

## 2021-02-15 DIAGNOSIS — F331 Major depressive disorder, recurrent, moderate: Secondary | ICD-10-CM

## 2021-02-15 MED ORDER — HYDROXYZINE HCL 10 MG PO TABS
10.0000 mg | ORAL_TABLET | Freq: Two times a day (BID) | ORAL | 2 refills | Status: DC | PRN
Start: 2021-02-15 — End: 2021-05-18

## 2021-02-15 MED ORDER — TRAZODONE HCL 50 MG PO TABS: 50.0000 mg | ORAL_TABLET | Freq: Every evening | ORAL | 2 refills | Status: DC | PRN

## 2021-02-15 NOTE — Progress Notes (Signed)
[place] MD/PA/NP OP Progress Note Virtual Visit via Telephone Note  I connected with Nancy Dickerson on 02/15/21 at 11:10 AM EDT by telephone and verified that I am speaking with the correct person using two identifiers.  Location: Patient: home Provider: Clinic   I discussed the limitations, risks, security and privacy concerns of performing an evaluation and management service by telephone and the availability of in person appointments. I also discussed with the patient that there may be a patient responsible charge related to this service. The patient expressed understanding and agreed to proceed.   I provided 30 minutes of non-face-to-face time during this encounter.   02/15/2021 12:00 PM Nancy Dickerson  MRN:  [id number]  Chief Complaint:  "I discontinued all of my medications and im more anxious"  HPI: 40 year old female seen today for follow up psychiatric evaluation. She has a psychiatric history of anxiety, depression, and insomnia. She is currently managed on Cymbalta 30 mg, Abilify 5 mg daily, Trazodne 50 mg nightly, and hydroxyzine 50 mg three times daily as needed. Today she discontinued all of her medications.  Today patient was unable to login virtually so her assessment was done over the phone. During exam she was  pleasant, cooperative, and engaged in conversation. She informed provider that she discontinued her medications because she needs to be more focused on her goddaughter who she is adopting.  She notes that this child is 8 months and inform provider that her medications made her too sleepy and inattentive to the baby.  She notes that since being off her medications her anxiety and depression has improved however reports that most days she still has mild anxiety/depression.  Provider conducted a GAD-7 and patient scored a 7, at her last visit she scored a 10.  Provider also conducted a PHQ-9 and patient scored a 9, at her last visit she scored a 12.  Patient notes that having  the child as a part of her life has improved her anxiety and depression.  She does endorse disturbed sleep noting that she sleeps 4 to 5 hours.  She informed Probation officer that her 31-month-year-old god daughter have has issues sleeping and she notes that she is up attending to her most of the night.  Patient notes that her appetite has increased however reports that she is lost approximately 5 pounds.  Today she denies SI/HI/VAH, mania, or paranoia.  Today she is agreeable to reduce hydroxyzine 50-100 mg nightly to 10 mg 3 times daily as needed to help manage anxiety.  She will also restart trazodone 50 mg as needed to help manage sleep.  At this time she does not want to restart other medications.  No other concerns at this time.     Visit Diagnosis:    ICD-10-CM   1. Generalized anxiety disorder  F41.1 hydrOXYzine (ATARAX/VISTARIL) 10 MG tablet  2. Moderate episode of recurrent major depressive disorder (HCC)  F33.1 traZODone (DESYREL) 50 MG tablet    Past Psychiatric History: Anxiety, Depression, and insomnia   Past Medical History:  Past Medical History:  Diagnosis Date  . Anemia   . Asthma   . Bursitis of hip   . Cardiomegaly   . Chondromalacia of hip   . Chondromalacia of right knee   . COPD (chronic obstructive pulmonary disease) ([place])   . GERD (gastroesophageal reflux disease)   . H/O transfusion of packed red blood cells   . Tinnitus     Past Surgical History:  Procedure Laterality Date  .  CERVICAL CONIZATION W/BX N/A 06/25/2019   Procedure: CONIZATION CERVIX WITH BIOPSY - COLD KNIFE;  Surgeon: Emily Filbert, MD;  Location: [facility];  Service: Gynecology;  Laterality: N/A;  . ENDOMETRIAL ABLATION N/A 06/25/2019   Procedure: MINERVA ABLATION;  Surgeon: Emily Filbert, MD;  Location: [facility];  Service: Gynecology;  Laterality: N/A;  Minerva rep will here confirmed on 06/20/19 CS  . HYSTEROSCOPY WITH D & C  06/25/2019   Procedure: DILATATION AND  CURETTAGE /HYSTEROSCOPY;  Surgeon: Emily Filbert, MD;  Location: [facility];  Service: Gynecology;;  . NO PAST SURGERIES      Family Psychiatric History: Daughter SA, Mother Bipolar disorder, and sister Bipolar disorder  Family History:  Family History  Problem Relation Age of Onset  . Sudden death Father 27    Social History:  Social History   Socioeconomic History  . Marital status: Single    Spouse name: Not on file  . Number of children: 6  . Years of education: 59  . Highest education level: Not on file  Occupational History  . 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 Smoking status: Never Smoker  . Smokeless tobacco: Never Used  Vaping Use  . Vaping Use: Never used  Substance and Sexual Activity  . Alcohol use: Yes    Comment: rare  . Drug use: Yes    Types: Marijuana  . Sexual activity: Not on file  Other Topics Concern  . Not on file  Social History Narrative   ** Merged History Encounter **         Lives with someone   Right handed    Little caffeine - drinks Sprite   Education - some college          Social Determinants of Health   Financial Resource Strain: Not on file  Food Insecurity: No Food Insecurity  . Worried About Charity fundraiser in the Last Year: Never true  . Ran Out of Food in the Last Year: Never true  Transportation Needs: No Transportation Needs  . Lack of Transportation (Medical): No  . Lack of Transportation (Non-Medical): No  Physical Activity: Not on file  Stress: Not on file  Social Connections: Not on file    Allergies: No Known Allergies  Metabolic Disorder Labs: Lab Results  Component Value Date   HGBA1C 5.7 (H) 06/03/2020   MPG 99.67 01/30/2018   No results found for: PROLACTIN Lab Results  Component Value Date   CHOL 115 01/30/2018   TRIG 89 01/30/2018   HDL 39 (L) 01/30/2018   CHOLHDL 2.9 01/30/2018   VLDL 18 01/30/2018   LDLCALC 58 01/30/2018   Lab Results  Component Value Date   TSH  0.768 07/08/2018    Therapeutic Level Labs: No results found for: LITHIUM No results found for: VALPROATE No components found for:  CBMZ  Current Medications: Current Outpatient Medications  Medication Sig Dispense Refill  . fluticasone (FLOVENT HFA) 110 MCG/ACT inhaler Inhale into the lungs 2 (two) times daily.    . hydrOXYzine (ATARAX/VISTARIL) 10 MG tablet Take 1 tablet (10 mg total) by mouth 2 (two) times daily as needed for anxiety (Sleep). 90 tablet 2  . ibuprofen (ADVIL) 800 MG tablet Take 1 tablet (800 mg total) by mouth 3 (three) times daily. (Patient not taking: Reported on 02/04/2021) 21 tablet 0  . metoprolol tartrate (LOPRESSOR) 25 MG tablet Take 1 tablet (25 mg total) by mouth 2 (  two) times daily. (Patient not taking: No sig reported) 180 tablet 3  . Naltrexone-buPROPion HCl ER (CONTRAVE) 8-90 MG TB12 1 TAB TWICE DAILY X7 1 TAB TWICE DAILY X 7 2 TAB MORNING AND 1 EVENING X 7 AND 2 TABS TWICE DAILY UNTIL 120 tablet 0  . phentermine 37.5 MG capsule Take 1 capsule (37.5 mg total) by mouth every morning. (Patient not taking: Reported on 02/04/2021) 30 capsule 0  . traZODone (DESYREL) 50 MG tablet Take 1 tablet (50 mg total) by mouth at bedtime as needed for sleep. 30 tablet 2   No current facility-administered medications for this visit.     Musculoskeletal: Strength & Muscle Tone: Unable to assess due to telephone visit Gait & Station: Unable to assess due to telephone visit Patient leans: N/A  Psychiatric Specialty Exam: Review of Systems  There were no vitals taken for this visit.There is no height or weight on file to calculate BMI.  General Appearance: Unable to assess due to telephone visit  Eye Contact:  Unable to assess due to telephone visit  Speech:  Clear and Coherent and Normal Rate  Volume:  Normal  Mood:  Euthymic  Affect:  Appropriate and Congruent  Thought Process:  Coherent, Goal Directed and Linear  Orientation:  Full (Time, Place, and Person)  Thought  Content: WDL and Logical   Suicidal Thoughts:  No  Homicidal Thoughts:  No  Memory:  Immediate;   Good Recent;   Good Remote;   Good  Judgement:  Good  Insight:  Good  Psychomotor Activity:  Normal  Concentration:  Concentration: Good and Attention Span: Good  Recall:  Good  Fund of Knowledge: Good  Language: Good  Akathisia:  No  Handed:  Right  AIMS (if indicated): Not done  Assets:  Communication Skills Desire for Improvement Financial Resources/Insurance Housing Intimacy Physical Health Social Support  ADL's:  Intact  Cognition: WNL  Sleep:  Fair   Screenings: GAD-7   Flowsheet Row Video Visit from 02/15/2021 in [facility] Video Visit from 01/12/2021 in [facility] Video Visit from 10/14/2020 in [facility] Office Visit from 06/03/2020 in [facility] [place] [facility] Office Visit from 07/07/2019 in [place]  Total GAD-7 Score 7 10 16 5 8     PHQ2-9   Flowsheet Row Video Visit from 02/15/2021 in [facility] Video Visit from 01/12/2021 in [facility] Video Visit from 10/14/2020 in [facility] Office Visit from 06/03/2020 in [facility] [place] [facility] Office Visit from 07/07/2019 in [place]  PHQ-2 Total Score 4 3 3 2 1   PHQ-9 Total Score 12 12 14 6 12     Flowsheet Row Video Visit from 02/15/2021 in [facility] Video Visit from 01/12/2021 in [facility] ED from 12/13/2020 in [facility] [place] No Risk No Risk No Risk       Assessment and Plan: Patient notes that her anxiety and depression has improved since her last visit.  She informed provider that she discontinued all of her  medications.  Today she notes that she does want to restart hydroxyzine however request that the dose be lowered to 10 mg 3 times a day to help manage anxiety.  She is also agreeable to  restarting trazodone 50 mg as needed for sleep.  1. Generalized anxiety disorder  Reduced/Restart- hydrOXYzine (ATARAX/VISTARIL) 10 MG tablet; Take 1 tablet (10 mg total) by mouth 2 (two) times daily as needed for anxiety (Sleep).  Dispense: 90 tablet; Refill: 2  2. Moderate episode of recurrent major depressive disorder (HCC)  Restart- traZODone (DESYREL) 50 MG tablet; Take 1 tablet (50 mg total) by mouth at bedtime as needed for sleep.  Dispense: 30 tablet; Refill: 2   Follow up in 3 months  Salley Slaughter, NP 02/15/2021, 12:00 PM

## 2021-02-18 ENCOUNTER — Telehealth (HOSPITAL_COMMUNITY): Payer: Medicaid Other | Admitting: Psychiatry

## 2021-02-28 ENCOUNTER — Telehealth (HOSPITAL_COMMUNITY): Payer: Self-pay | Admitting: Licensed Clinical Social Worker

## 2021-02-28 ENCOUNTER — Other Ambulatory Visit: Payer: Self-pay

## 2021-02-28 ENCOUNTER — Ambulatory Visit (HOSPITAL_COMMUNITY): Payer: Medicaid Other | Admitting: Licensed Clinical Social Worker

## 2021-02-28 NOTE — Telephone Encounter (Signed)
Pt connected for 2pm Therapy assessment. Pt stated she needed to be back to work by [employer] and could not complete assessment. Assessment rescheduled for June 30th.

## 2021-03-24 ENCOUNTER — Telehealth: Payer: Self-pay

## 2021-03-24 NOTE — Telephone Encounter (Signed)
Pt said she said her ribs are sore from waiting up this morning and wasn't's her provider to contact her.

## 2021-03-25 ENCOUNTER — Other Ambulatory Visit: Payer: Self-pay

## 2021-03-25 ENCOUNTER — Ambulatory Visit (HOSPITAL_COMMUNITY)
Admission: EM | Admit: 2021-03-25 | Discharge: 2021-03-25 | Disposition: A | Discharge status: Home / Self Care | Payer: Medicaid Other | Attending: Physician Assistant | Admitting: Physician Assistant

## 2021-03-25 ENCOUNTER — Encounter (HOSPITAL_COMMUNITY): Payer: Self-pay

## 2021-03-25 ENCOUNTER — Ambulatory Visit (INDEPENDENT_AMBULATORY_CARE_PROVIDER_SITE_OTHER): Payer: Medicaid Other

## 2021-03-25 DIAGNOSIS — R222 Localized swelling, mass and lump, trunk: Secondary | ICD-10-CM | POA: Diagnosis not present

## 2021-03-25 DIAGNOSIS — R0781 Pleurodynia: Secondary | ICD-10-CM

## 2021-03-25 DIAGNOSIS — R52 Pain, unspecified: Secondary | ICD-10-CM

## 2021-03-25 DIAGNOSIS — M940 Chondrocostal junction syndrome [Tietze]: Secondary | ICD-10-CM | POA: Diagnosis not present

## 2021-03-25 MED ORDER — PREDNISONE 10 MG (21) PO TBPK: ORAL_TABLET | ORAL | 0 refills | Status: DC

## 2021-03-25 NOTE — Telephone Encounter (Signed)
Patient stated she is having what feeling like inflammation. We do have an opening today at 11:20am but patient stated she may not be able to make it. Advised patient she can go to [facility], she stated she will go there today after work.

## 2021-03-25 NOTE — ED Provider Notes (Signed)
[employer]    CSN: [id number] Arrival date & time: 03/25/21  1920      History   Chief Complaint Chief Complaint  Patient presents with   chest swelling    HPI Nancy Dickerson is a 40 y.o. female.   Patient presents today with a 4-day history of bilateral rib pain.  She reports symptoms began on her left side and then have spread to involve her anterior ribs bilaterally as well as her sternum.  She reports associated swelling and tenderness to palpation.  She is having difficulty taking a deep breath as a result of pain.  She denies any chest pain with activity, ongoing shortness of breath, cough, fever, nausea, vomiting.  She does have a history of previous rib fracture and wants to ensure that there are no active fractures.  She denies any known injury or history of osteopenia.  She has tried ibuprofen without improvement of symptoms.  She reports pain is rated 9 on a 0-10 pain scale, localized to inferior chest with radiation to sternum, described as aching and periodic sharp pains, worse with certain movements or positions, no alleviating factors identified.  She has been unable to perform daily duties including work duties as a result of symptoms.   Past Medical History:  Diagnosis Date   Anemia    Asthma    Bursitis of hip    Cardiomegaly    Chondromalacia of hip    Chondromalacia of right knee    COPD (chronic obstructive pulmonary disease) (HCC)    GERD (gastroesophageal reflux disease)    H/O transfusion of packed red blood cells    Tinnitus     Patient Active Problem List   Diagnosis Date Noted   Attention deficit hyperactivity disorder (ADHD), combined type 01/13/2021   Chronic obstructive pulmonary disease ([place]) 11/26/2020   Moderate episode of recurrent major depressive disorder ([place]) 10/14/2020   Generalized anxiety disorder 10/14/2020   Dysphonia 08/25/2020   Laryngopharyngeal reflux (LPR) 08/25/2020   Pulsatile tinnitus of right ear 08/25/2020    Dysfunctional uterine bleeding 06/03/2020   Patellofemoral pain syndrome of left knee 11/08/2019   Acute pain of left knee 08/13/2019   Insomnia 07/07/2019   Lumbar radicular pain 07/07/2019   Uterine leiomyoma 03/12/2018   Trochanteric bursitis of right hip 03/12/2018   Class 3 severe obesity due to excess calories without serious comorbidity with body mass index (BMI) of 40.0 to 44.9 in adult ([place]) 03/12/2018   Iron deficiency anemia due to chronic blood loss 01/29/2018   Hypokalemia 01/29/2018   Right sided weakness 01/29/2018    Past Surgical History:  Procedure Laterality Date   CERVICAL CONIZATION W/BX N/A 06/25/2019   Procedure: CONIZATION CERVIX WITH BIOPSY - COLD KNIFE;  Surgeon: Emily Filbert, MD;  Location: [facility];  Service: Gynecology;  Laterality: N/A;   ENDOMETRIAL ABLATION N/A 06/25/2019   Procedure: MINERVA ABLATION;  Surgeon: Emily Filbert, MD;  Location: [facility];  Service: Gynecology;  Laterality: N/A;  [employer] rep will here confirmed on 06/20/19 CS   HYSTEROSCOPY WITH D & C  06/25/2019   Procedure: DILATATION AND CURETTAGE /HYSTEROSCOPY;  Surgeon: Emily Filbert, MD;  Location: [facility];  Service: Gynecology;;   NO PAST SURGERIES      OB History     Gravida  7   Para      Term      Preterm      AB  1  Living  6      SAB  1   IAB      Ectopic      Multiple      Live Births               Home Medications    Prior to Admission medications   Medication Sig Start Date End Date Taking? Authorizing Provider  metoprolol tartrate (LOPRESSOR) 25 MG tablet Take 1 tablet (25 mg total) by mouth 2 (two) times daily. 12/31/20 03/31/21 Yes Hochrein, Jeneen Rinks, MD  predniSONE (STERAPRED UNI-PAK 21 TAB) 10 MG (21) TBPK tablet As directed. 03/25/21  Yes [place] Ice K, PA-C  fluticasone (FLOVENT HFA) 110 MCG/ACT inhaler Inhale into the lungs 2 (two) times daily.    Joline Salt, RN  hydrOXYzine  (ATARAX/VISTARIL) 10 MG tablet Take 1 tablet (10 mg total) by mouth 2 (two) times daily as needed for anxiety (Sleep). 02/15/21   Salley Slaughter, NP  ibuprofen (ADVIL) 800 MG tablet Take 1 tablet (800 mg total) by mouth 3 (three) times daily. Patient not taking: Reported on 02/04/2021 12/13/20   Hughie Closs, PA-C  Naltrexone-buPROPion HCl ER (CONTRAVE) 8-90 MG TB12 1 TAB TWICE DAILY X7 1 TAB TWICE DAILY X 7 2 TAB MORNING AND 1 EVENING X 7 AND 2 TABS TWICE DAILY UNTIL 02/07/21   Vevelyn Francois, NP  phentermine 37.5 MG capsule Take 1 capsule (37.5 mg total) by mouth every morning. Patient not taking: Reported on 02/04/2021 01/07/21 02/06/21  Vevelyn Francois, NP  traZODone (DESYREL) 50 MG tablet Take 1 tablet (50 mg total) by mouth at bedtime as needed for sleep. 02/15/21   Salley Slaughter, NP    Family History Family History  Problem Relation Age of Onset   Sudden death Father 55    Social History Social History   Tobacco Use   Smoking status: Never   Smokeless tobacco: Never  Vaping Use   Vaping Use: Never used  Substance Use Topics   Alcohol use: Yes    Comment: rare   Drug use: Yes    Types: Marijuana     Allergies   Patient has no known allergies.   Review of Systems Review of Systems  Constitutional:  Positive for activity change. Negative for appetite change, fatigue and fever.  Respiratory:  Negative for cough and shortness of breath.   Cardiovascular:  Positive for chest pain (chest wall pain/ rib pain).  Gastrointestinal:  Negative for abdominal pain, diarrhea, nausea and vomiting.  Musculoskeletal:  Negative for arthralgias and myalgias.  Neurological:  Negative for dizziness, light-headedness and headaches.    Physical Exam Triage Vital Signs ED Triage Vitals  Enc Vitals Group     BP 03/25/21 1951 (!) 131/91     Pulse Rate 03/25/21 1951 71     Resp 03/25/21 1951 19     Temp 03/25/21 1951 98.4 F (36.9 C)     Temp Source 03/25/21 1951 Oral     SpO2  03/25/21 1951 98 %     Weight --      Height --      Head Circumference --      Peak Flow --      Pain Score 03/25/21 1948 9     Pain Loc --      Pain Edu? --      Excl. in [place]? --    No data found.  Updated Vital Signs BP (!) 131/91 (BP Location: Left Arm)  Pulse 71   Temp 98.4 F (36.9 C) (Oral)   Resp 19   SpO2 98%   Visual Acuity Right Eye Distance:   Left Eye Distance:   Bilateral Distance:    Right Eye Near:   Left Eye Near:    Bilateral Near:     Physical Exam Vitals reviewed.  Constitutional:      General: She is awake. She is not in acute distress.    Appearance: Normal appearance. She is normal weight. She is not ill-appearing.     Comments: Very pleasant female appears stated age in no acute distress  HENT:     Head: Normocephalic and atraumatic.  Cardiovascular:     Rate and Rhythm: Normal rate and regular rhythm.     Heart sounds: Normal heart sounds, S1 normal and S2 normal. No murmur heard. Pulmonary:     Effort: Pulmonary effort is normal.     Breath sounds: Normal breath sounds. No wheezing, rhonchi or rales.     Comments: Clear to auscultation bilaterally Chest:     Chest wall: Swelling and tenderness present. No deformity.  Abdominal:     Palpations: Abdomen is soft.     Tenderness: There is no abdominal tenderness.  Psychiatric:        Behavior: Behavior is cooperative.     UC Treatments / Results  Labs (all labs ordered are listed, but only abnormal results are displayed) Labs Reviewed - No data to display  EKG   Radiology DG Ribs Bilateral W/Chest  Result Date: 03/25/2021 CLINICAL DATA:  Chest swelling for 3 days EXAM: BILATERAL RIBS AND CHEST - 4+ VIEW COMPARISON:  10/27/2020 FINDINGS: Frontal view of the chest as well as frontal and oblique views of the bilateral thoracic cage are obtained. Cardiac silhouette is stable. No airspace disease, effusion, or pneumothorax. There are no acute displaced rib fractures. Chronic healed  left posterior sixth rib fracture again noted. IMPRESSION: 1. No acute intrathoracic process. Electronically Signed   By: Randa Ngo M.D.   On: 03/25/2021 20:31    Procedures Procedures (including critical care time)  Medications Ordered in UC Medications - No data to display  Initial Impression / Assessment and Plan / UC Course  I have reviewed the triage vital signs and the nursing notes.  Pertinent labs & imaging results that were available during my care of the patient were reviewed by me and considered in my medical decision making (see chart for details).      X-ray obtained showed no acute fracture or intrathoracic abnormality.  Discussed costochondritis as likely etiology given tenderness palpation on exam without acute findings on imaging.  Patient has been using and failed NSAID therapy so we will try prednisone taper.  She was instructed not to take NSAIDs with prednisone due to risk of GI bleeding.  She can use Tylenol for breakthrough pain as well as topical medications.  Discussed alarm symptoms that warrant emergent evaluation.  Strict return precautions given to which patient expressed understanding.  Final Clinical Impressions(s) / UC Diagnoses   Final diagnoses:  Pain  Rib pain  Costochondritis     Discharge Instructions      Your x-ray was normal.  Please take prednisone taper as prescribed.  Do not take NSAIDs (aspirin, ibuprofen/Advil, naproxen/Aleve) with this medication as it can cause stomach bleeding.  Avoid strenuous activities.  If you have any worsening symptoms please return for reevaluation.     ED Prescriptions     Medication Sig Dispense  Auth. Provider   predniSONE (STERAPRED UNI-PAK 21 TAB) 10 MG (21) TBPK tablet As directed. 21 tablet Audi Conover, Derry Skill, PA-C      PDMP not reviewed this encounter.   Terrilee Croak, PA-C 03/25/21 2054

## 2021-03-25 NOTE — ED Triage Notes (Signed)
Pt in with c/o chest swelling for a few days  Denies any chest injury or strenuous activity   Pt has taken ibuprofen with no relief

## 2021-03-25 NOTE — Discharge Instructions (Signed)
Your x-ray was normal.  Please take prednisone taper as prescribed.  Do not take NSAIDs (aspirin, ibuprofen/Advil, naproxen/Aleve) with this medication as it can cause stomach bleeding.  Avoid strenuous activities.  If you have any worsening symptoms please return for reevaluation.

## 2021-04-07 ENCOUNTER — Ambulatory Visit (HOSPITAL_COMMUNITY): Payer: Medicaid Other | Admitting: Licensed Clinical Social Worker

## 2021-04-07 ENCOUNTER — Telehealth (HOSPITAL_COMMUNITY): Payer: Self-pay | Admitting: Licensed Clinical Social Worker

## 2021-04-07 NOTE — Telephone Encounter (Signed)
LCSW sent link to pt phone, she entered chat. LCSW stated name and and position at [facility]. Pt stated this was not a good time she was at the gym . This was pt 2nd attempt to do appointment first time pt had work and rescheduled. LCSW asked about therapy and what her goals were. Pt stated she just wants to do medications mgmt at this time. LCSW provided pt with her next scheduled medication mgmt appt. Nancy Dickerson was agreeable not to go through with therapy services.

## 2021-04-12 ENCOUNTER — Other Ambulatory Visit (HOSPITAL_COMMUNITY): Payer: Self-pay

## 2021-04-12 ENCOUNTER — Telehealth (HOSPITAL_COMMUNITY): Payer: Medicaid Other | Admitting: Psychiatry

## 2021-04-24 ENCOUNTER — Ambulatory Visit
Admission: EM | Admit: 2021-04-24 | Discharge: 2021-04-24 | Disposition: A | Discharge status: Home / Self Care | Payer: Medicaid Other | Attending: Emergency Medicine | Admitting: Emergency Medicine

## 2021-04-24 ENCOUNTER — Other Ambulatory Visit: Payer: Self-pay

## 2021-04-24 ENCOUNTER — Encounter: Payer: Self-pay | Admitting: Emergency Medicine

## 2021-04-24 DIAGNOSIS — S71111A Laceration without foreign body, right thigh, initial encounter: Secondary | ICD-10-CM

## 2021-04-24 MED ORDER — IBUPROFEN 600 MG PO TABS: 600.0000 mg | ORAL_TABLET | Freq: Four times a day (QID) | ORAL | 0 refills | Status: DC | PRN

## 2021-04-24 NOTE — ED Triage Notes (Signed)
States her TDAP is up to date.

## 2021-04-24 NOTE — ED Triage Notes (Signed)
PT cut her right upper leg on an exposed piece of her couch last night at 10pm

## 2021-04-24 NOTE — ED Provider Notes (Signed)
HPI  SUBJECTIVE:  Nancy Dickerson is a 40 y.o. female who presents with a laceration sustained to her lateral right thigh on a nail about 16 hours prior to evaluation.  She reports pain.  No fevers, redness, pus.  She applied pressure with hemostasis.  She cleaned it with peroxide, applied Neosporin and a Band-Aid.  No alleviating factors.  Symptoms are worse with sitting.  She has a past medical history of COPD, borderline diabetes and hypertension.  History of chronic kidney disease.  Tetanus up-to-date.  LMP: 2 weeks ago.  Denies possibility of being pregnant.  PQZ:RAQT, Diona Foley, NP   Past Medical History:  Diagnosis Date   Anemia    Asthma    Bursitis of hip    Cardiomegaly    Chondromalacia of hip    Chondromalacia of right knee    COPD (chronic obstructive pulmonary disease) (HCC)    GERD (gastroesophageal reflux disease)    H/O transfusion of packed red blood cells    Tinnitus     Past Surgical History:  Procedure Laterality Date   CERVICAL CONIZATION W/BX N/A 06/25/2019   Procedure: CONIZATION CERVIX WITH BIOPSY - COLD KNIFE;  Surgeon: Emily Filbert, MD;  Location: [facility];  Service: Gynecology;  Laterality: N/A;   ENDOMETRIAL ABLATION N/A 06/25/2019   Procedure: MINERVA ABLATION;  Surgeon: Emily Filbert, MD;  Location: [facility];  Service: Gynecology;  Laterality: N/A;  [employer] rep will here confirmed on 06/20/19 CS   HYSTEROSCOPY WITH D & C  06/25/2019   Procedure: DILATATION AND CURETTAGE /HYSTEROSCOPY;  Surgeon: Emily Filbert, MD;  Location: [facility];  Service: Gynecology;;   NO PAST SURGERIES      Family History  Problem Relation Age of Onset   Sudden death Father 87    Social History   Tobacco Use   Smoking status: Never   Smokeless tobacco: Never  Vaping Use   Vaping Use: Never used  Substance Use Topics   Alcohol use: Yes    Comment: rare   Drug use: Yes    Types: Marijuana    No current  facility-administered medications for this encounter.  Current Outpatient Medications:    DULoxetine (CYMBALTA) 20 MG capsule, Take 40 mg by mouth daily., Disp: , Rfl:    ibuprofen (ADVIL) 600 MG tablet, Take 1 tablet (600 mg total) by mouth every 6 (six) hours as needed., Disp: 30 tablet, Rfl: 0   Acetaminophen-Codeine 300-30 MG tablet, Take 1 tablet by mouth every 4 (four) hours as needed for up to 5 days for pain., Disp: 30 tablet, Rfl: 0   fluticasone (FLOVENT HFA) 110 MCG/ACT inhaler, Inhale into the lungs 2 (two) times daily., Disp: , Rfl:    hydrOXYzine (ATARAX/VISTARIL) 10 MG tablet, Take 1 tablet (10 mg total) by mouth 2 (two) times daily as needed for anxiety (Sleep)., Disp: 90 tablet, Rfl: 2   metoprolol tartrate (LOPRESSOR) 25 MG tablet, Take 1 tablet (25 mg total) by mouth 2 (two) times daily., Disp: 180 tablet, Rfl: 3   Naltrexone-buPROPion HCl ER (CONTRAVE) 8-90 MG TB12, 1 TAB TWICE DAILY X7 1 TAB TWICE DAILY X 7 2 TAB MORNING AND 1 EVENING X 7 AND 2 TABS TWICE DAILY UNTIL, Disp: 120 tablet, Rfl: 0   phentermine 37.5 MG capsule, Take 1 capsule (37.5 mg total) by mouth every morning. (Patient not taking: Reported on 02/04/2021), Disp: 30 capsule, Rfl: 0   traZODone (DESYREL) 50 MG tablet,  Take 1 tablet (50 mg total) by mouth at bedtime as needed for sleep., Disp: 30 tablet, Rfl: 2  No Active Allergies   ROS  As noted in HPI.   Physical Exam  BP 132/90   Pulse 72   Temp 97.8 F (36.6 C) (Oral)   Resp 16   LMP 04/10/2021   SpO2 97%   Constitutional: Well developed, well nourished, no acute distress Eyes:  EOMI, conjunctiva normal bilaterally HENT: Normocephalic, atraumatic,mucus membranes moist Respiratory: Normal inspiratory effort Cardiovascular: Normal rate GI: nondistended skin: Clean 3 cm linear laceration to the subcutaneous fat lateral right thigh      Musculoskeletal: no deformities Neurologic: Alert & oriented x 3, no focal neuro deficits Psychiatric:  Speech and behavior appropriate   ED Course   Medications - No data to display  No orders of the defined types were placed in this encounter.   No results found for this or any previous visit (from the past 24 hour(s)). No results found.  ED Clinical Impression  1. Laceration of right thigh, initial encounter      ED Assessment/Plan  Procedure note: Cleaned area with chlorhexidine.  Used 1.5 cc of lidocaine 2% with epinephrine via infiltration with adequate local anesthesia.  And had staff irrigate out wound.  Placed 2 deep 5-0 horizontal mattress sutures and 4 interrupted 4-0 Prolene sutures with close approximation of wound edges.  Dressing placed.  Patient tolerated procedure well.  Patient's tetanus is up-to-date.  Will send home with Tylenol and ibuprofen.  Do not think that this needs prophylactic antibiotics as it is not contaminated with organic material return here in 10 days for suture removal, sooner for any signs of infection  Discussed  MDM, treatment plan, and plan for follow-up with patient. D patient agrees with plan.   Meds ordered this encounter  Medications   ibuprofen (ADVIL) 600 MG tablet    Sig: Take 1 tablet (600 mg total) by mouth every 6 (six) hours as needed.    Dispense:  30 tablet    Refill:  0      *This clinic note was created using [employer]. Therefore, there may be occasional mistakes despite careful proofreading.  ?    Melynda Ripple, MD 04/25/21 [phone]

## 2021-04-24 NOTE — Discharge Instructions (Addendum)
Take 600 mg of ibuprofen combined with 1000 mg of Tylenol together 3-4 times a day as needed for pain.  Keep this clean and dry for the next 48 to 72 hours at least.  Give it some dry time so that it can heal.

## 2021-04-25 ENCOUNTER — Telehealth (INDEPENDENT_AMBULATORY_CARE_PROVIDER_SITE_OTHER): Payer: Medicaid Other | Admitting: Nurse Practitioner

## 2021-04-25 ENCOUNTER — Encounter: Payer: Self-pay | Admitting: Nurse Practitioner

## 2021-04-25 DIAGNOSIS — S71111D Laceration without foreign body, right thigh, subsequent encounter: Secondary | ICD-10-CM

## 2021-04-25 MED ORDER — ACETAMINOPHEN-CODEINE 300-30 MG PO TABS: 1.0000 | ORAL_TABLET | ORAL | 0 refills | Status: AC | PRN

## 2021-04-25 NOTE — Patient Instructions (Signed)
Laceration Care, Adult A laceration is a cut that may go through all layers of the skin. The cut may also go into the tissue that is right under the skin. Some cuts heal on their own. Others need to be closed with stitches (sutures), staples, skin adhesive strips, or skin glue. Taking care of your injury lowers your risk of infection, helps your injury to heal better, and mayprevent scarring. Supplies needed: Soap. Water. Hand sanitizer. Bandage (dressing). Antibiotic ointment. Clean towel. How to take care of your cut Wash your hands with soap and water before touching your wound or changing yourbandage. If soap and water are not available, use hand sanitizer. If your doctor used stitches or staples: Keep the wound clean and dry. If you were given a bandage, change it at least once a day as told by your doctor. You should also change it if it gets wet or dirty. Keep the wound completely dry for the first 24 hours, or as told by your doctor. After that, you may take a shower or a bath. Do not get the wound soaked in water until after the stitches or staples have been removed. Clean the wound once a day, or as told by your doctor: Wash the wound with soap and water. Rinse the wound with water to remove all soap. Pat the wound dry with a clean towel. Do not rub the wound. After you clean the wound, put a thin layer of antibiotic ointment on it as told by your doctor. This ointment: Helps to prevent infection. Keeps the bandage from sticking to the wound. Have your stitches or staples removed as told by your doctor. If your doctor used skin adhesive strips: Keep the wound clean and dry. If you were given a bandage, you should change it at least once a day as told by your doctor. You should also change it if it gets wet or dirty. Do not get the skin adhesive strips wet. You can take a shower or a bath, but keep the wound dry. If the wound gets wet, pat it dry with a clean towel. Do not rub the  wound. Skin adhesive strips fall off on their own. You can trim the strips as the wound heals. Do not remove any strips that are still stuck to the wound. They will fall off after a while. If your doctor used skin glue: Try to keep your wound dry, but you may briefly wet it in the shower or bath. Do not soak the wound in water, such as by swimming. After you take a shower or a bath, gently pat the wound dry with a clean towel. Do not rub the wound. Do not do any activities that will make you really sweaty until the skin glue has fallen off on its own. Do not apply liquid, cream, or ointment medicine to your wound while the skin glue is still on. If you were given a bandage, you should change it at least once a day or as told by your doctor. You should also change it if it gets dirty or wet. If a bandage is placed over the wound, do not let the tape touch the skin glue. Do not pick at the glue. The skin glue usually stays on for 5-10 days. Then, it falls off the skin. General instructions  Take over-the-counter and prescription medicines only as told by your doctor. If you were given antibiotic medicine or ointment, take or apply it as told by your doctor. Do  not stop using it even if your condition improves. Do not scratch or pick at the wound. Check your wound every day for signs of infection. Watch for: Redness, swelling, or pain. Fluid, blood, or pus. Raise (elevate) the injured area above the level of your heart while you are sitting or lying down. If directed, put ice on the affected area: Put ice in a plastic bag. Place a towel between your skin and the bag. Leave the ice on for 20 minutes, 2-3 times a day. Prevent scarring by covering your wound with sunscreen of at least 30 SPF whenever you are outside after your wound has healed. Keep all follow-up visits as told by your doctor. This is important.  Get help if: You got a tetanus shot and you have any of these problems at the  injection site: Swelling. Very bad pain. Redness. Bleeding. You have a fever. A wound that was closed breaks open. You notice a bad smell coming from your wound or your bandage. You notice something coming out of the wound, such as wood or glass. Medicine does not relieve your pain. You have more redness, swelling, or pain at the site of your wound. You have fluid, blood, or pus coming from your wound. You notice a change in the color of your skin near your wound. You need to change the bandage often because fluid, blood, or pus is coming from the wound. You start to have a new rash. You start to have numbness around the wound. Get help right away if: You have very bad swelling around the wound. Your pain suddenly gets worse and is very bad. You notice painful lumps near the wound or anywhere on your body. You have a red streak going away from your wound. The wound is on your hand or foot, and: You cannot move a finger or toe. Your fingers or toes look pale or bluish. Summary A laceration is a cut that may go through all layers of the skin. The cut may also go into the tissue right under the skin. Some cuts heal on their own. Others need to be closed with stitches, staples, skin adhesive strips, or skin glue. Follow your doctor's instructions for caring for your cut. Proper care of a cut lowers the risk of infection, helps the cut heal better, and prevents scarring. This information is not intended to replace advice given to you by your health care provider. Make sure you discuss any questions you have with your healthcare provider. Document Revised: 11/23/2017 Document Reviewed: 10/15/2017 Elsevier Patient Education  2022 [employer].

## 2021-04-25 NOTE — Progress Notes (Signed)
   [address], [place]  [address] Phone:  [phone]   Fax:  [phone] Virtual Visit via Telephone Note  I connected with Nancy Dickerson on 04/25/21 at  2:40 PM EDT by telephone and verified that I am speaking with the correct person using two identifiers.   I discussed the limitations, risks, security and privacy concerns of performing an evaluation and management service by telephone and the availability of in person appointments. I also discussed with the patient that there may be a patient responsible charge related to this service. The patient expressed understanding and agreed to proceed.  Patient home Provider Office  History of Present Illness:  Nancy Dickerson  has a past medical history of Anemia, Asthma, Bursitis of hip, Cardiomegaly, Chondromalacia of hip, Chondromalacia of right knee, COPD (chronic obstructive pulmonary disease) ([place]), GERD (gastroesophageal reflux disease), H/O transfusion of packed red blood cells, and Tinnitus.   She reports that she suffered a laceration to her right thigh around her knee area. She has aching pain that she rates a 7/10. She reports it is difficulty to sitting and lying,. She is fearful that the stitches will come out.    ROS   Observations/Objective: No exam; telephone visit  Assessment and Plan: Assessment  Primary Diagnosis & Pertinent Problem List: The encounter diagnosis was Laceration of right thigh, subsequent encounter.  Visit Diagnosis: 1. Laceration of right thigh, subsequent encounter  Short course of Tylenol 3 for leg pain Note provided     Follow Up Instructions: Fu as needed   I discussed the assessment and treatment plan with the patient. The patient was provided an opportunity to ask questions and all were answered. The patient agreed with the plan and demonstrated an understanding of the instructions.   The patient was advised to call back or seek an in-person  evaluation if the symptoms worsen or if the condition fails to improve as anticipated.  I provided 11 minutes of telephone- visit time during this encounter.   Vevelyn Francois, NP

## 2021-05-05 ENCOUNTER — Other Ambulatory Visit: Payer: Self-pay | Admitting: Physician Assistant

## 2021-05-05 NOTE — Telephone Encounter (Signed)
ok 

## 2021-05-18 ENCOUNTER — Telehealth (INDEPENDENT_AMBULATORY_CARE_PROVIDER_SITE_OTHER): Payer: Medicaid Other | Admitting: Psychiatry

## 2021-05-18 ENCOUNTER — Encounter (HOSPITAL_COMMUNITY): Payer: Self-pay | Admitting: Psychiatry

## 2021-05-18 ENCOUNTER — Other Ambulatory Visit (HOSPITAL_COMMUNITY): Payer: Self-pay | Admitting: Psychiatry

## 2021-05-18 DIAGNOSIS — F331 Major depressive disorder, recurrent, moderate: Secondary | ICD-10-CM

## 2021-05-18 DIAGNOSIS — F411 Generalized anxiety disorder: Secondary | ICD-10-CM

## 2021-05-18 MED ORDER — DULOXETINE HCL 40 MG PO CPEP: ORAL_CAPSULE | ORAL | 2 refills | Status: DC

## 2021-05-18 NOTE — Progress Notes (Signed)
[place] MD/PA/NP OP Progress Note Virtual Visit via Telephone Note  I connected with Nancy Dickerson on 05/18/21 at  9:00 AM EDT by telephone and verified that I am speaking with the correct person using two identifiers.  Location: Patient: home Provider: Clinic   I discussed the limitations, risks, security and privacy concerns of performing an evaluation and management service by telephone and the availability of in person appointments. I also discussed with the patient that there may be a patient responsible charge related to this service. The patient expressed understanding and agreed to proceed.   I provided 30 minutes of non-face-to-face time during this encounter.   05/18/2021 9:30 AM Nancy Dickerson  MRN:  [id number]  Chief Complaint:  "I have been up and down"  HPI: 40 year old female seen today for follow up psychiatric evaluation.  She has psychiatric history of anxiety, depression, and insomnia. She is currently managed on Hydroxyzine '10mg'$  TID, Trazodone '50mg'$  nightly as needed, and Cymbalta '30mg'$  daily. She notes that she discontinued her medications and has been taking her mothers Xanax.  Today, patient was unable to log on virtually, so her assessment was done on the phone. During exam she is pleasant, cooperative, engaged in [place]. She informed provider that since her last visit, she has had a lot of life stressors and feels depressed. She is tearful discussing her mood and recent life events today.  Patient reports that she separated from her husband in May 2022. Her daughter ran away during this time and now lives with her dad. Patient notes that she was in the process of attempting to adopt her goddaughter and notes that this process is not going on anymore; patient gave her back to her guardians. She is also having conflict with friends. She feels that the people around her make her feel less better than she is, so she has been avoiding family and friends and is afraid to  trust them. She is frustrated with work because she is unable to take time off to deal with her stress and anxiety. She sometimes asks herself what's wrong with her to make people treat her so poorly. She stays in bed often and does not want to leave the house.   Patient notes her medications are not effective in managing her psychiatric conditions; however, she has not been taking her medications as prescribed. She stopped taking her medication because she was oversleeping. She admits that she has been taking her mom's anxiety medicine (Xanax) instead of her prescribed medications because it keeps her calm and works better than what she has been taking.   Patient notes that the above exacerbates her anxiety and depression.Today, provider conducted a GAD-7 and patient scored a 14, at her last visit she scored a 7. Provider also conducted a PHQ-9 and patient scored a 15, at her last visit she scored a 9. She notes that she is sleeping approximately 12 hours per day. She endorsed poor appetite and states that she has unintentionally lost approximately 15 pounds. She denies SI, HI, VAH, mania, or paranoia.    Today, patient is agreeable to continue Cymbalta and increase it from 30 mg to 40 mg to help manage her anxiety and depression. She does not want to continue Hydroxyzine, Trazodone, or Abilify because they make her sleepy and groggy. She states her mother and sister both take Xanax and would like this medication added to her psychiatric medication management. However, she does not want any medication that would prevent her from  owning or operating a firearm at the gun range. Provider advised patient of the addictive properties of benzodiazepines including Xanax, and recommended Buspar as an alternative with side effects including drowsiness and nausea. Patient declines Buspar addition today. Provider also recommends genetic testing for tailored medication profile, which patient is agreeable to today.  No  other concerns noted at this time.     Visit Diagnosis:    ICD-10-CM   1. Generalized anxiety disorder  F41.1 DULoxetine 40 MG CPEP    2. Moderate episode of recurrent major depressive disorder (HCC)  F33.1 DULoxetine 40 MG CPEP      Past Psychiatric History: Anxiety, Depression, and insomnia    Past Medical History:  Past Medical History:  Diagnosis Date   Anemia    Asthma    Bursitis of hip    Cardiomegaly    Chondromalacia of hip    Chondromalacia of right knee    COPD (chronic obstructive pulmonary disease) (HCC)    GERD (gastroesophageal reflux disease)    H/O transfusion of packed red blood cells    Tinnitus     Past Surgical History:  Procedure Laterality Date   CERVICAL CONIZATION W/BX N/A 06/25/2019   Procedure: CONIZATION CERVIX WITH BIOPSY - COLD KNIFE;  Surgeon: Emily Filbert, MD;  Location: [place];  Service: Gynecology;  Laterality: N/A;   ENDOMETRIAL ABLATION N/A 06/25/2019   Procedure: MINERVA ABLATION;  Surgeon: Emily Filbert, MD;  Location: [place];  Service: Gynecology;  Laterality: N/A;  [employer] rep will here confirmed on 06/20/19 CS   HYSTEROSCOPY WITH D & C  06/25/2019   Procedure: DILATATION AND CURETTAGE /HYSTEROSCOPY;  Surgeon: Emily Filbert, MD;  Location: [place];  Service: Gynecology;;   NO PAST SURGERIES      Family Psychiatric History: Daughter SA, Mother Bipolar disorder, and sister Bipolar disorder  Family History:  Family History  Problem Relation Age of Onset   Sudden death Father 38    Social History:  Social History   Socioeconomic History   Marital status: Single    Spouse name: Not on file   Number of children: 6   Years of education: 12   Highest education level: Not on file  Occupational History   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  Tobacco Use   Smoking status: Never   Smokeless tobacco: Never  Vaping Use   Vaping Use: Never used  Substance and Sexual Activity   Alcohol use: Yes     Comment: rare   Drug use: Yes    Types: Marijuana   Sexual activity: Not on file  Other Topics Concern   Not on file  Social History Narrative   ** Merged History Encounter **         Lives with someone   Right handed    Little caffeine - drinks Sprite   Education - some college          Social Determinants of Health   Financial Resource Strain: Not on file  Food Insecurity: No Food Insecurity   Worried About Charity fundraiser in the Last Year: Never true   Arboriculturist in the Last Year: Never true  Transportation Needs: No Transportation Needs   Lack of Transportation (Medical): No   Lack of Transportation (Non-Medical): No  Physical Activity: Not on file  Stress: Not on file  Social Connections: Not on file    Allergies: No Active Allergies  Metabolic Disorder Labs:  Lab Results  Component Value Date   HGBA1C 5.7 (H) 06/03/2020   MPG 99.67 01/30/2018   No results found for: PROLACTIN Lab Results  Component Value Date   CHOL 115 01/30/2018   TRIG 89 01/30/2018   HDL 39 (L) 01/30/2018   CHOLHDL 2.9 01/30/2018   VLDL 18 01/30/2018   LDLCALC 58 01/30/2018   Lab Results  Component Value Date   TSH 0.768 07/08/2018    Therapeutic Level Labs: No results found for: LITHIUM No results found for: VALPROATE No components found for:  CBMZ  Current Medications: Current Outpatient Medications  Medication Sig Dispense Refill   DULoxetine 40 MG CPEP TAKE 1 CAPSULE BY MOUTH EVERY DAY 30 capsule 2   fluticasone (FLOVENT HFA) 110 MCG/ACT inhaler Inhale into the lungs 2 (two) times daily.     ibuprofen (ADVIL) 600 MG tablet Take 1 tablet (600 mg total) by mouth every 6 (six) hours as needed. 30 tablet 0   metoprolol tartrate (LOPRESSOR) 25 MG tablet Take 1 tablet (25 mg total) by mouth 2 (two) times daily. 180 tablet 3   Naltrexone-buPROPion HCl ER (CONTRAVE) 8-90 MG TB12 1 TAB TWICE DAILY X7 1 TAB TWICE DAILY X 7 2 TAB MORNING AND 1 EVENING X 7 AND 2 TABS TWICE  DAILY UNTIL 120 tablet 0   phentermine 37.5 MG capsule Take 1 capsule (37.5 mg total) by mouth every morning. (Patient not taking: Reported on 02/04/2021) 30 capsule 0   No current facility-administered medications for this visit.     Musculoskeletal: Strength & Muscle Tone:  Unable to assess due to telephone visit Gait & Station:  Unable to assess due to telephone visit Patient leans: N/A  Psychiatric Specialty Exam: Review of Systems  There were no vitals taken for this visit.There is no height or weight on file to calculate BMI.  General Appearance:  Unable to assess due to telephone visit  Eye Contact:   Unable to assess due to telephone visit  Speech:  Clear and Coherent and Normal Rate  Volume:  Normal  Mood:  Anxious and Depressed  Affect:  Appropriate and Congruent  Thought Process:  Coherent, Goal Directed and Linear  Orientation:  Full (Time, Place, and Person)  Thought Content: WDL and Logical   Suicidal Thoughts:  No  Homicidal Thoughts:  No  Memory:  Immediate;   Good Recent;   Good Remote;   Good  Judgement:  Good  Insight:  Good  Psychomotor Activity:  Normal  Concentration:  Concentration: Good and Attention Span: Good  Recall:  Good  Fund of Knowledge: Good  Language: Good  Akathisia:  No  Handed:  Right  AIMS (if indicated): Not done  Assets:  Communication Skills Desire for Improvement Financial Resources/Insurance Housing Intimacy Physical Health Social Support  ADL's:  Intact  Cognition: WNL  Sleep:  Fair   Screenings: GAD-7    Flowsheet Row Video Visit from 05/18/2021 in [facility] Video Visit from 02/15/2021 in [facility] Video Visit from 01/12/2021 in [facility] Video Visit from 10/14/2020 in [facility] Office Visit from 06/03/2020 in [place] [facility] [place] [facility]  Total GAD-7 Score '14 7 10 16 5       '$ PHQ2-9    Flowsheet Row Video Visit from 05/18/2021 in [facility] Video Visit from 02/15/2021 in [facility] Video Visit from 01/12/2021 in [place]  [facility] Video Visit from 10/14/2020 in [facility] Office Visit from 06/03/2020 in [place] for [place] at [facility]  PHQ-2 Total Score '5 4 3 3 2  '$ PHQ-9 Total Score '15 12 12 14 6      '$ Flowsheet Row ED from 04/24/2021 in [facility]  ED from 03/25/2021 in [facility] Video Visit from 02/15/2021 in [place] No Risk Error: Question 6 not populated No Risk        Assessment and Plan: Patient notes that her anxiety and depression has worsened since her last visit.  She informed provider that she discontinued all of her medications and has only been taking her mother xanax.  Today she notes that she only wants to restart cymbalta which was increased to 40 mg.  1. Generalized anxiety disorder  Restart- DULoxetine 40 MG CPEP; TAKE 1 CAPSULE BY MOUTH EVERY DAY  Dispense: 30 capsule; Refill: 2  2. Moderate episode of recurrent major depressive disorder (HCC)  Restart- DULoxetine 40 MG CPEP; TAKE 1 CAPSULE BY MOUTH EVERY DAY  Dispense: 30 capsule; Refill: 2    Follow up in 3 months  Salley Slaughter, NP 05/18/2021, 9:30 AM

## 2021-05-23 ENCOUNTER — Other Ambulatory Visit: Payer: Self-pay

## 2021-05-23 ENCOUNTER — Ambulatory Visit (INDEPENDENT_AMBULATORY_CARE_PROVIDER_SITE_OTHER): Payer: Medicaid Other | Admitting: Nurse Practitioner

## 2021-05-23 ENCOUNTER — Encounter: Payer: Self-pay | Admitting: Nurse Practitioner

## 2021-05-23 ENCOUNTER — Other Ambulatory Visit: Payer: Self-pay | Admitting: Nurse Practitioner

## 2021-05-23 DIAGNOSIS — Z789 Other specified health status: Secondary | ICD-10-CM

## 2021-05-23 DIAGNOSIS — M25511 Pain in right shoulder: Secondary | ICD-10-CM | POA: Diagnosis not present

## 2021-05-23 DIAGNOSIS — M542 Cervicalgia: Secondary | ICD-10-CM

## 2021-05-23 MED ORDER — CYCLOBENZAPRINE HCL 5 MG PO TABS: 5.0000 mg | ORAL_TABLET | Freq: Three times a day (TID) | ORAL | 1 refills | Status: DC | PRN
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
## 2021-06-01 DIAGNOSIS — R0602 Shortness of breath: Secondary | ICD-10-CM | POA: Insufficient documentation

## 2021-06-01 DIAGNOSIS — R000 Tachycardia, unspecified: Secondary | ICD-10-CM | POA: Insufficient documentation

## 2021-06-01 NOTE — Progress Notes (Deleted)
Cardiology Office Note   Date:  06/01/2021   ID:  Nancy, Dickerson [birth year], MRN [id number]  PCP:  Vevelyn Francois, NP  Cardiologist:   Minus Breeding, MD Referring:  Vevelyn Francois, NP  No chief complaint on file.     History of Present Illness: Nancy Dickerson is a 40 y.o. female who is referred by Vevelyn Francois, NP for evaluation of tachycardia.  She has no pain history. You see an echocardiogram from a few years ago 50 to 55%. She describes some kind of stress testing but it was not a treadmill test. She kept being told that she had poor inspiratory effort. I do not have these results. 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 However, she really could not give me any other details on her cardiac history. She has some chronic anemia. She is sent here because of palpitations.  After the first visit I suggested a stress test but she was a no show for this.  She did not present for office follow up.  She had SVT on a monitor and I suggested beta blocker.  ***    ***I do see that she was in the emergency room. This was in January of this year. She tested positive for Covid. This was her second bout of Covid and she had it last year as well. She was seen initially in urgent care and her heart rate was in the 140s. I see an EKG with a heart rate of 137 sinus tachycardia. I see another EKG with heart rates again sinus tachycardia in the 120s. She says she feels her heart fluttering. It comes on at rest. She is limited in her walking secondary to increased heart rate and shortness of breath. She gets some mild chest tightness. He does not have presyncope or syncope. She is not describing PND or orthopnea. She has had no cough fevers or chills.  Past Medical History:  Diagnosis Date   Anemia    Asthma    Bursitis of hip    Cardiomegaly    Chondromalacia of hip    Chondromalacia of right knee    COPD (chronic obstructive pulmonary  disease) (HCC)    GERD (gastroesophageal reflux disease)    H/O transfusion of packed red blood cells    Tinnitus     Past Surgical History:  Procedure Laterality Date   CERVICAL CONIZATION W/BX N/A 06/25/2019   Procedure: CONIZATION CERVIX WITH BIOPSY - COLD KNIFE;  Surgeon: Emily Filbert, MD;  Location: [place];  Service: Gynecology;  Laterality: N/A;   ENDOMETRIAL ABLATION N/A 06/25/2019   Procedure: MINERVA ABLATION;  Surgeon: Emily Filbert, MD;  Location: [place];  Service: Gynecology;  Laterality: N/A;  Minerva rep will here confirmed on 06/20/19 CS   HYSTEROSCOPY WITH D & C  06/25/2019   Procedure: DILATATION AND CURETTAGE /HYSTEROSCOPY;  Surgeon: Emily Filbert, MD;  Location: [place];  Service: Gynecology;;   NO PAST SURGERIES       Current Outpatient Medications  Medication Sig Dispense Refill   cyclobenzaprine (FLEXERIL) 5 MG tablet Take 1 tablet (5 mg total) by mouth 3 (three) times daily as needed for muscle spasms. 30 tablet 1   DULoxetine HCl 40 MG CPEP TAKE 1 CAPSULE BY MOUTH EVERY DAY 30 capsule 2   fluticasone (FLOVENT HFA) 110 MCG/ACT inhaler Inhale into the lungs  2 (two) times daily.     ibuprofen (ADVIL) 600 MG tablet Take 1 tablet (600 mg total) by mouth every 6 (six) hours as needed. 30 tablet 0   metoprolol tartrate (LOPRESSOR) 25 MG tablet Take 1 tablet (25 mg total) by mouth 2 (two) times daily. 180 tablet 3   Naltrexone-buPROPion HCl ER (CONTRAVE) 8-90 MG TB12 1 TAB TWICE DAILY X7 1 TAB TWICE DAILY X 7 2 TAB MORNING AND 1 EVENING X 7 AND 2 TABS TWICE DAILY UNTIL 120 tablet 0   No current facility-administered medications for this visit.    Allergies:   Patient has no known allergies.    ROS:  Please see the history of present illness.   Otherwise, review of systems are positive for ***.   All other systems are reviewed and negative.    PHYSICAL EXAM: VS:  There were no vitals taken for this visit. , BMI  There is no height or weight on file to calculate BMI. GENERAL:  Well appearing NECK:  No jugular venous distention, waveform within normal limits, carotid upstroke brisk and symmetric, no bruits, no thyromegaly LUNGS:  Clear to auscultation bilaterally CHEST:  Unremarkable HEART:  PMI not displaced or sustained,S1 and S2 within normal limits, no S3, no S4, no clicks, no rubs, *** murmurs ABD:  Flat, positive bowel sounds normal in frequency in pitch, no bruits, no rebound, no guarding, no midline pulsatile mass, no hepatomegaly, no splenomegaly EXT:  2 plus pulses throughout, no edema, no cyanosis no clubbing    ***GENERAL:  Well appearing HEENT:  Pupils equal round and reactive, fundi not visualized, oral mucosa unremarkable NECK:  No jugular venous distention, waveform within normal limits, carotid upstroke brisk and symmetric, no bruits, no thyromegaly LYMPHATICS:  No cervical, inguinal adenopathy LUNGS:  Clear to auscultation bilaterally BACK:  No CVA tenderness CHEST:  Unremarkable HEART:  PMI not displaced or sustained,S1 and S2 within normal limits, no S3, no S4, no clicks, no rubs, no murmurs ABD:  Flat, positive bowel sounds normal in frequency in pitch, no bruits, no rebound, no guarding, no midline pulsatile mass, no hepatomegaly, no splenomegaly EXT:  2 plus pulses throughout, no edema, no cyanosis no clubbing SKIN:  No rashes no nodules NEURO:  Cranial nerves II through XII grossly intact, motor grossly intact throughout PSYCH:  Cognitively intact, oriented to person place and time   EKG:  EKG is *** ordered today. The ekg ordered today demonstrates sinus rhythm, rate ***, axis 50, intervals within normal limits, no acute ST-T wave changes.   Recent Labs: 10/27/2020: BUN 8; Creatinine, Ser 0.83; Hemoglobin 9.7; Platelets 392; Potassium 3.6; Sodium 135    Lipid Panel    Component Value Date/Time   CHOL 115 01/30/2018 0355   TRIG 89 01/30/2018 0355   HDL 39 (L)  01/30/2018 0355   CHOLHDL 2.9 01/30/2018 0355   VLDL 18 01/30/2018 0355   LDLCALC 58 01/30/2018 0355      Wt Readings from Last 3 Encounters:  05/23/21 255 lb (115.7 kg)  02/04/21 258 lb 3.2 oz (117.1 kg)  01/07/21 263 lb (119.3 kg)      Other studies Reviewed: Additional studies/ records that were reviewed today include: *** Review of the above records demonstrates:  Please see elsewhere in the note.     ASSESSMENT AND PLAN:  TACHYCARDIA:    *** I am going to have her wear a 2-week event monitor. I do note that she has not had recent thyroid so  I would like her to get this done when she comes back. She is anemic but this is baseline.  SOB:  ***  Given the chest discomfort shortness of breath and tachycardia I am going to bring her back for a POET (Plain Old Exercise Treadmill)   Current medicines are reviewed at length with the patient today.  The patient does not have concerns regarding medicines.  The following changes have been made:  ***  Labs/ tests ordered today include: ***  No orders of the defined types were placed in this encounter.    Disposition:   FU with me in ***   Signed, Minus Breeding, MD  06/01/2021 9:52 PM    [facility]

## 2021-06-03 ENCOUNTER — Ambulatory Visit: Payer: Medicaid Other | Admitting: Cardiology

## 2021-06-03 ENCOUNTER — Telehealth (HOSPITAL_COMMUNITY): Payer: Self-pay | Admitting: Psychiatry

## 2021-06-03 DIAGNOSIS — R000 Tachycardia, unspecified: Secondary | ICD-10-CM

## 2021-06-03 DIAGNOSIS — R0602 Shortness of breath: Secondary | ICD-10-CM

## 2021-06-06 ENCOUNTER — Encounter (HOSPITAL_COMMUNITY): Payer: Self-pay | Admitting: Psychiatry
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
## 2021-06-08 ENCOUNTER — Telehealth (HOSPITAL_COMMUNITY): Payer: Self-pay | Admitting: *Deleted

## 2021-06-08 NOTE — Telephone Encounter (Signed)
[facility] Request Determination- Approval. DULoxetine 40 MG Capsule  EFFECTIVE: 06/08/2021   THRU   06/08/2022

## 2021-06-17 DIAGNOSIS — Z20822 Contact with and (suspected) exposure to covid-19: Secondary | ICD-10-CM | POA: Diagnosis not present

## 2021-06-23 ENCOUNTER — Ambulatory Visit (INDEPENDENT_AMBULATORY_CARE_PROVIDER_SITE_OTHER): Payer: Medicaid Other | Admitting: Obstetrics and Gynecology

## 2021-06-23 ENCOUNTER — Encounter: Payer: Self-pay | Admitting: Obstetrics and Gynecology

## 2021-06-23 ENCOUNTER — Other Ambulatory Visit: Payer: Self-pay

## 2021-06-23 VITALS — BP 129/93 | HR 86 | Wt 253.0 lb

## 2021-06-23 DIAGNOSIS — N938 Other specified abnormal uterine and vaginal bleeding: Secondary | ICD-10-CM | POA: Diagnosis not present

## 2021-06-23 DIAGNOSIS — D250 Submucous leiomyoma of uterus: Secondary | ICD-10-CM

## 2021-06-23 MED ORDER — TRANEXAMIC ACID 650 MG PO TABS: 1300.0000 mg | ORAL_TABLET | Freq: Three times a day (TID) | ORAL | 2 refills | Status: DC

## 2021-06-23 NOTE — Progress Notes (Signed)
  CC: irregular bleeding Subjective:    Patient ID: Nancy Dickerson, female    DOB: 04-09-1981, 40 y.o.   MRN: [id number]  HPI 40 yo G7P6 seen at [facility].  Pt states she is still having some bleeding.  Menses are mostly regular, but she notes she still has bleeding with walking or position change.  Previously we had discussed IUD or uterine embolization as she does have some fibroids.  The patient is adamant still that she does not want a hysterectomy.  Discussed Sonata as a new treatment modality which may address both issues.   Review of Systems     Objective:   Physical Exam Vitals:   06/23/21 0824  BP: (!) 129/93  Pulse: 86         Assessment & Plan:   1. Dysfunctional uterine bleeding Information given to patient regarding Sonata procedure.  Will repeat the ultrasound since it has been two years since last scan.  Will also perform endometrial biopsy before deciding on next treatment. Oral TXA given to address current uterine bleeding. - US PELVIC COMPLETE WITH TRANSVAGINAL; Future - tranexamic acid (LYSTEDA) 650 MG TABS tablet; Take 2 tablets (1,300 mg total) by mouth 3 (three) times daily. Take during menses for a maximum of five days  Dispense: 30 tablet; Refill: 2  2. Submucous leiomyoma of uterus Recheck ultrasound to help plan treatment options  F/U in 1 month for endometrial biopsy  and discussion of treatment.  Information given to schedulers for pt to begin paperwork for possible Sonata procedure.  I spent 20 minutes dedicated to the care of this patient including previsit review of records, face to face time with the patient discussing treatment options, diagnostic testing and post visit testing.     Griffin Basil, MD Faculty Attending, [facility]

## 2021-07-06 ENCOUNTER — Telehealth: Payer: Self-pay | Admitting: *Deleted

## 2021-07-06 NOTE — Telephone Encounter (Signed)
Call to patient to review Sonata procedure scheduling. Message " call cannot be completed at this time."  Will send My Chart message.

## 2021-07-13 ENCOUNTER — Ambulatory Visit (HOSPITAL_COMMUNITY)
Admission: EM | Admit: 2021-07-13 | Discharge: 2021-07-13 | Disposition: A | Discharge status: Home / Self Care | Payer: Medicaid Other | Attending: Internal Medicine | Admitting: Internal Medicine

## 2021-07-13 ENCOUNTER — Encounter (HOSPITAL_COMMUNITY): Payer: Self-pay

## 2021-07-13 ENCOUNTER — Other Ambulatory Visit: Payer: Self-pay

## 2021-07-13 DIAGNOSIS — R519 Headache, unspecified: Secondary | ICD-10-CM | POA: Insufficient documentation

## 2021-07-13 DIAGNOSIS — N390 Urinary tract infection, site not specified: Secondary | ICD-10-CM | POA: Insufficient documentation

## 2021-07-13 DIAGNOSIS — Z7901 Long term (current) use of anticoagulants: Secondary | ICD-10-CM | POA: Diagnosis not present

## 2021-07-13 DIAGNOSIS — R5081 Fever presenting with conditions classified elsewhere: Secondary | ICD-10-CM | POA: Insufficient documentation

## 2021-07-13 DIAGNOSIS — R509 Fever, unspecified: Secondary | ICD-10-CM

## 2021-07-13 DIAGNOSIS — M79661 Pain in right lower leg: Secondary | ICD-10-CM | POA: Insufficient documentation

## 2021-07-13 DIAGNOSIS — R000 Tachycardia, unspecified: Secondary | ICD-10-CM | POA: Diagnosis not present

## 2021-07-13 DIAGNOSIS — Z79899 Other long term (current) drug therapy: Secondary | ICD-10-CM | POA: Insufficient documentation

## 2021-07-13 DIAGNOSIS — R350 Frequency of micturition: Secondary | ICD-10-CM

## 2021-07-13 DIAGNOSIS — Z20822 Contact with and (suspected) exposure to covid-19: Secondary | ICD-10-CM | POA: Insufficient documentation

## 2021-07-13 DIAGNOSIS — I10 Essential (primary) hypertension: Secondary | ICD-10-CM | POA: Diagnosis not present

## 2021-07-13 LAB — POCT URINALYSIS DIPSTICK, ED / UC
Bilirubin Urine: NEGATIVE
Glucose, UA: NEGATIVE mg/dL
Ketones, ur: NEGATIVE mg/dL
Nitrite: NEGATIVE
Protein, ur: NEGATIVE mg/dL
Specific Gravity, Urine: 1.02 (ref 1.005–1.030)
Urobilinogen, UA: 0.2 mg/dL (ref 0.0–1.0)
pH: 7 (ref 5.0–8.0)

## 2021-07-13 LAB — POC URINE PREG, ED: Preg Test, Ur: NEGATIVE

## 2021-07-13 LAB — POC INFLUENZA A AND B ANTIGEN (URGENT CARE ONLY)
INFLUENZA A ANTIGEN, POC: NEGATIVE
INFLUENZA B ANTIGEN, POC: NEGATIVE

## 2021-07-13 LAB — SARS CORONAVIRUS 2 (TAT 6-24 HRS): SARS Coronavirus 2: NEGATIVE

## 2021-07-13 MED ORDER — ACETAMINOPHEN 325 MG PO TABS
ORAL_TABLET | ORAL | Status: AC
  Filled 2021-07-13: qty 2

## 2021-07-13 MED ORDER — ACETAMINOPHEN 325 MG PO TABS
650.0000 mg | ORAL_TABLET | Freq: Once | ORAL | Status: AC
  Administered 2021-07-13: 650 mg via ORAL

## 2021-07-13 MED ORDER — CEFTRIAXONE SODIUM 1 G IJ SOLR
1.0000 g | Freq: Once | INTRAMUSCULAR | Status: AC
Start: 2021-07-13 — End: 2021-07-13
  Administered 2021-07-13: 1 g via INTRAMUSCULAR

## 2021-07-13 MED ORDER — SULFAMETHOXAZOLE-TRIMETHOPRIM 800-160 MG PO TABS: 1.0000 | ORAL_TABLET | Freq: Two times a day (BID) | ORAL | 0 refills | Status: AC

## 2021-07-13 MED ORDER — CEFTRIAXONE SODIUM 1 G IJ SOLR
INTRAMUSCULAR | Status: AC
  Filled 2021-07-13: qty 10

## 2021-07-13 NOTE — ED Triage Notes (Signed)
Pt presents w/ c/o tacycardia and htn after monitoring her BP and pulse at home. PT states sx began yesterday. Pt states her ribs, throat hurt and some SOB and rt calf pain

## 2021-07-13 NOTE — ED Provider Notes (Signed)
[facility]    CSN: [id number] Arrival date & time: 07/13/21  6063      History   Chief Complaint Chief Complaint  Patient presents with   Hypertension    HPI Nancy Dickerson is a 40 y.o. female.   Established UC patient presents w/ c/o tacycardia and HTN after monitoring her BP and pulse at home overnight, patient states blood pressure systolic ranged from 016-010 mmHg with a diastolic of 93-23 mmHg.  Patient states that she urinated frequently last night, is concerned she may be a little dehydrated today, denies burning with urination but does admit to having some pain in her lower back.  States she is been drinking ginger ale to rehydrate.  Pt states that yesterday her ribs began to hurt and she developed a fullness in her throat with some SOB and right calf pain. Pt is febrile and tachycardic at this time with a normal blood pressure.  Patient denies cough.  Patient endorses body ache, headache, fatigue.  Patient was given Tylenol upon arrival by medical assistant.  The history is provided by the patient.   Past Medical History:  Diagnosis Date   Anemia    Asthma    Bursitis of hip    Cardiomegaly    Chondromalacia of hip    Chondromalacia of right knee    COPD (chronic obstructive pulmonary disease) (HCC)    GERD (gastroesophageal reflux disease)    H/O transfusion of packed red blood cells    Tinnitus     Patient Active Problem List   Diagnosis Date Noted   Tachycardia 06/01/2021   SOB (shortness of breath) 06/01/2021   Attention deficit hyperactivity disorder (ADHD), combined type 01/13/2021   Chronic obstructive pulmonary disease ([place]) 11/26/2020   Moderate episode of recurrent major depressive disorder ([place]) 10/14/2020   Generalized anxiety disorder 10/14/2020   Dysphonia 08/25/2020   Laryngopharyngeal reflux (LPR) 08/25/2020   Pulsatile tinnitus of right ear 08/25/2020   Dysfunctional uterine bleeding 06/03/2020   Patellofemoral pain syndrome of  left knee 11/08/2019   Acute pain of left knee 08/13/2019   Insomnia 07/07/2019   Lumbar radicular pain 07/07/2019   Uterine leiomyoma 03/12/2018   Trochanteric bursitis of right hip 03/12/2018   Class 3 severe obesity due to excess calories without serious comorbidity with body mass index (BMI) of 40.0 to 44.9 in adult ([place]) 03/12/2018   Iron deficiency anemia due to chronic blood loss 01/29/2018   Hypokalemia 01/29/2018   Right sided weakness 01/29/2018    Past Surgical History:  Procedure Laterality Date   CERVICAL CONIZATION W/BX N/A 06/25/2019   Procedure: CONIZATION CERVIX WITH BIOPSY - COLD KNIFE;  Surgeon: Emily Filbert, MD;  Location: [place];  Service: Gynecology;  Laterality: N/A;   ENDOMETRIAL ABLATION N/A 06/25/2019   Procedure: MINERVA ABLATION;  Surgeon: Emily Filbert, MD;  Location: [place];  Service: Gynecology;  Laterality: N/A;  [employer] rep will here confirmed on 06/20/19 CS   HYSTEROSCOPY WITH D & C  06/25/2019   Procedure: DILATATION AND CURETTAGE /HYSTEROSCOPY;  Surgeon: Emily Filbert, MD;  Location: [place];  Service: Gynecology;;   NO PAST SURGERIES      OB History     Gravida  7   Para      Term      Preterm      AB  1   Living  6      SAB  1  IAB      Ectopic      Multiple      Live Births               Home Medications    Prior to Admission medications   Medication Sig Start Date End Date Taking? Authorizing Provider  sulfamethoxazole-trimethoprim (BACTRIM DS) 800-160 MG tablet Take 1 tablet by mouth 2 (two) times daily for 10 days. 07/13/21 07/23/21 Yes Lynden Oxford Scales, PA-C  albuterol (PROVENTIL) (2.5 MG/3ML) 0.083% nebulizer solution 2.5 mg every 6 (six) hours as needed.    [provider]  cyclobenzaprine (FLEXERIL) 5 MG tablet Take 1 tablet (5 mg total) by mouth 3 (three) times daily as needed for muscle spasms. 05/23/21   Vevelyn Francois, NP  DULoxetine  (CYMBALTA) 20 MG capsule Take by mouth. 07/31/19   [provider]  DULoxetine HCl 40 MG CPEP TAKE 1 CAPSULE BY MOUTH EVERY DAY 05/18/21   Salley Slaughter, NP  fluticasone (FLOVENT HFA) 110 MCG/ACT inhaler Inhale into the lungs 2 (two) times daily.    Joline Salt, RN  ibuprofen (ADVIL) 600 MG tablet Take 1 tablet (600 mg total) by mouth every 6 (six) hours as needed. 04/24/21   Melynda Ripple, MD  levofloxacin (LEVAQUIN) 500 MG tablet Take 1 tablet by mouth daily. 11/24/19   [provider]  metoprolol tartrate (LOPRESSOR) 25 MG tablet Take 1 tablet (25 mg total) by mouth 2 (two) times daily. 12/31/20 03/31/21  Minus Breeding, MD  Naltrexone-buPROPion HCl ER (CONTRAVE) 8-90 MG TB12 1 TAB TWICE DAILY X7 1 TAB TWICE DAILY X 7 2 TAB MORNING AND 1 EVENING X 7 AND 2 TABS TWICE DAILY UNTIL 02/07/21   Vevelyn Francois, NP  naproxen (NAPROSYN) 500 MG tablet Take by mouth. 08/18/19   [provider]  tranexamic acid (LYSTEDA) 650 MG TABS tablet Take 2 tablets (1,300 mg total) by mouth 3 (three) times daily. Take during menses for a maximum of five days 06/23/21   Griffin Basil, MD    Family History Family History  Problem Relation Age of Onset   Sudden death Father 39    Social History Social History   Tobacco Use   Smoking status: Never   Smokeless tobacco: Never  Vaping Use   Vaping Use: Never used  Substance Use Topics   Alcohol use: Yes    Comment: rare   Drug use: Yes    Types: Marijuana     Allergies   Patient has no known allergies.   Review of Systems Review of Systems Pertinent findings noted in history of present illness.    Physical Exam Triage Vital Signs ED Triage Vitals  Enc Vitals Group     BP 07/13/21 0850 109/62     Pulse Rate 07/13/21 0850 (!) 126     Resp 07/13/21 0850 14     Temp 07/13/21 0850 (!) 101.6 F (38.7 C)     Temp Source 07/13/21 0850 Oral     SpO2 07/13/21 0850 94 %     Weight --      Height --      Head  Circumference --      Peak Flow --      Pain Score 07/13/21 0853 4     Pain Loc --      Pain Edu? --      Excl. in [place]? --    No data found.  Updated Vital Signs BP 109/62 (BP Location:  Left Arm)   Pulse (!) 126   Temp (!) 101.6 F (38.7 C) (Oral)   Resp 14   SpO2 94%   Visual Acuity Right Eye Distance:   Left Eye Distance:   Bilateral Distance:    Right Eye Near:   Left Eye Near:    Bilateral Near:     Physical Exam Vitals and nursing note reviewed.  Constitutional:      General: She is not in acute distress.    Appearance: Normal appearance. She is obese. She is ill-appearing and toxic-appearing. She is not diaphoretic.  HENT:     Head: Normocephalic and atraumatic.     Salivary Glands: Right salivary gland is diffusely enlarged and tender. Left salivary gland is diffusely enlarged and tender.     Right Ear: Tympanic membrane, ear canal and external ear normal.     Left Ear: Tympanic membrane, ear canal and external ear normal.     Nose: Mucosal edema, congestion and rhinorrhea present. No nasal deformity or septal deviation.     Mouth/Throat:     Mouth: Mucous membranes are moist.     Dentition: Normal dentition. No gingival swelling.     Tongue: No lesions. Tongue does not deviate from midline.     Palate: No mass and lesions.     Pharynx: Oropharynx is clear.     Tonsils: No tonsillar exudate or tonsillar abscesses. 3+ on the right. 3+ on the left.  Eyes:     Extraocular Movements: Extraocular movements intact.     Conjunctiva/sclera: Conjunctivae normal.     Pupils: Pupils are equal, round, and reactive to light.  Neck:     Trachea: Trachea and phonation normal.  Cardiovascular:     Rate and Rhythm: Normal rate and regular rhythm.     Pulses: Normal pulses.     Heart sounds: Normal heart sounds.  Pulmonary:     Effort: Pulmonary effort is normal.     Breath sounds: Normal breath sounds.  Abdominal:     General: Abdomen is flat. Bowel sounds are normal.      Palpations: Abdomen is soft.  Musculoskeletal:        General: Normal range of motion.     Cervical back: Normal range of motion and neck supple.  Lymphadenopathy:     Cervical: Cervical adenopathy present.     Right cervical: Superficial cervical adenopathy, deep cervical adenopathy and posterior cervical adenopathy present.     Left cervical: Superficial cervical adenopathy, deep cervical adenopathy and posterior cervical adenopathy present.  Skin:    General: Skin is warm and dry.  Neurological:     General: No focal deficit present.     Mental Status: She is alert and oriented to person, place, and time. Mental status is at baseline.  Psychiatric:        Mood and Affect: Mood normal.        Behavior: Behavior normal.     UC Treatments / Results  Labs (all labs ordered are listed, but only abnormal results are displayed) Labs Reviewed  POCT URINALYSIS DIPSTICK, ED / UC - Abnormal; Notable for the following components:      Result Value   Hgb urine dipstick MODERATE (*)    Leukocytes,Ua SMALL (*)    All other components within normal limits  SARS CORONAVIRUS 2 (TAT 6-24 HRS)  URINE CULTURE  POC INFLUENZA A AND B ANTIGEN (URGENT CARE ONLY)  POC URINE PREG, ED    EKG   Radiology  No results found.  Procedures Procedures (including critical care time)  Medications Ordered in UC Medications  cefTRIAXone (ROCEPHIN) injection 1 g (has no administration in time range)  acetaminophen (TYLENOL) tablet 650 mg (650 mg Oral Given 07/13/21 0858)    Initial Impression / Assessment and Plan / UC Course  I have reviewed the triage vital signs and the nursing notes.  Pertinent labs & imaging results that were available during my care of the patient were reviewed by me and considered in my medical decision making (see chart for details).    Patient's rapid flu test today is negative.  We will await the results of her COVID-19 infection although I doubt this is going to be  positive given having had COVID-19 infection 9 months ago.  Dates she is not vaccinated for either flu or COVID at this time, states "I do not believe in those vaccines".  Patient's urinalysis revealed white blood cells and white blood cells in urine.  Given patient's fever and these abnormal findings, I recommend that she receive an injection of ceftriaxone to begin a 10-day course of Bactrim for complicated urinary tract infection treated outpatient.  Please see discharge instructions below for details of plan of care.  Patient verbalized understanding and agreement of plan as discussed.  All questions were addressed during visit.  Final Clinical Impressions(s) / UC Diagnoses   Final diagnoses:  Increased urinary frequency  Complicated urinary tract infection  Fever in other diseases     Discharge Instructions      Your urinalysis today was abnormal and concerning for acute urinary tract infection which may have already reached the level of your kidneys particularly since you are now having fever and other systemic symptoms.  Guidelines recommend that you receive an injection of an antibiotic called ceftriaxone and begin a 10-day course of Bactrim.  You were given the ceftriaxone injection during your visit today.  If your symptoms do not significantly improve in the next 5 days, please return for further evaluation or see your primary care for provider.  If your symptoms significantly worsen, please go to the emergency room as inpatient IV antibiotic therapy may be recommended.     ED Prescriptions     Medication Sig Dispense Auth. Provider   sulfamethoxazole-trimethoprim (BACTRIM DS) 800-160 MG tablet Take 1 tablet by mouth 2 (two) times daily for 10 days. 20 tablet Lynden Oxford Scales, PA-C      PDMP not reviewed this encounter.   Lynden Oxford Scales, [place] 07/13/21 [phone]

## 2021-07-13 NOTE — Discharge Instructions (Addendum)
Your urinalysis today was abnormal and concerning for acute urinary tract infection which may have already reached the level of your kidneys particularly since you are now having fever and other systemic symptoms.  Guidelines recommend that you receive an injection of an antibiotic called ceftriaxone and begin a 10-day course of Bactrim.  You were given the ceftriaxone injection during your visit today.  If your symptoms do not significantly improve in the next 5 days, please return for further evaluation or see your primary care for provider.  If your symptoms significantly worsen, please go to the emergency room as inpatient IV antibiotic therapy may be recommended.

## 2021-07-14 ENCOUNTER — Ambulatory Visit
Admission: EM | Admit: 2021-07-14 | Discharge: 2021-07-14 | Disposition: A | Discharge status: Home / Self Care | Payer: Medicaid Other | Attending: Urgent Care | Admitting: Urgent Care

## 2021-07-14 ENCOUNTER — Other Ambulatory Visit: Payer: Self-pay

## 2021-07-14 DIAGNOSIS — S63681A Other sprain of right thumb, initial encounter: Secondary | ICD-10-CM

## 2021-07-14 DIAGNOSIS — M79644 Pain in right finger(s): Secondary | ICD-10-CM

## 2021-07-14 LAB — URINE CULTURE

## 2021-07-14 MED ORDER — NAPROXEN 500 MG PO TABS: 500.0000 mg | ORAL_TABLET | Freq: Two times a day (BID) | ORAL | 0 refills | Status: DC

## 2021-07-14 NOTE — Discharge Instructions (Addendum)
Wear the Ace wrap during the day not in your sleep.  You can use naproxen twice daily for the pain and the inflammation.  If your thumb continues to hurt she then please make sure that you follow-up with hand specialist.  I provided information to a few for you.

## 2021-07-14 NOTE — ED Provider Notes (Signed)
[place]   MRN: [id number] DOB: 05/29/1981  Subjective:   [place] DARALYN BERT is a 40 y.o. female presenting for 5 day history of acute onset persistent right thumb from horse-playing with her son.  Reports that she had bent her thumb backward.  She had some bruising initially.  Has not taken medications or used any kinds of wraps for this.  Her range of motion and swelling, pain has slowly improved.  No current facility-administered medications for this encounter.  Current Outpatient Medications:    albuterol (PROVENTIL) (2.5 MG/3ML) 0.083% nebulizer solution, 2.5 mg every 6 (six) hours as needed., Disp: , Rfl:    cyclobenzaprine (FLEXERIL) 5 MG tablet, Take 1 tablet (5 mg total) by mouth 3 (three) times daily as needed for muscle spasms., Disp: 30 tablet, Rfl: 1   DULoxetine (CYMBALTA) 20 MG capsule, Take by mouth., Disp: , Rfl:    DULoxetine HCl 40 MG CPEP, TAKE 1 CAPSULE BY MOUTH EVERY DAY, Disp: 30 capsule, Rfl: 2   fluticasone (FLOVENT HFA) 110 MCG/ACT inhaler, Inhale into the lungs 2 (two) times daily., Disp: , Rfl:    ibuprofen (ADVIL) 600 MG tablet, Take 1 tablet (600 mg total) by mouth every 6 (six) hours as needed., Disp: 30 tablet, Rfl: 0   levofloxacin (LEVAQUIN) 500 MG tablet, Take 1 tablet by mouth daily., Disp: , Rfl:    metoprolol tartrate (LOPRESSOR) 25 MG tablet, Take 1 tablet (25 mg total) by mouth 2 (two) times daily., Disp: 180 tablet, Rfl: 3   Naltrexone-buPROPion HCl ER (CONTRAVE) 8-90 MG TB12, 1 TAB TWICE DAILY X7 1 TAB TWICE DAILY X 7 2 TAB MORNING AND 1 EVENING X 7 AND 2 TABS TWICE DAILY UNTIL, Disp: 120 tablet, Rfl: 0   naproxen (NAPROSYN) 500 MG tablet, Take by mouth., Disp: , Rfl:    sulfamethoxazole-trimethoprim (BACTRIM DS) 800-160 MG tablet, Take 1 tablet by mouth 2 (two) times daily for 10 days., Disp: 20 tablet, Rfl: 0   tranexamic acid (LYSTEDA) 650 MG TABS tablet, Take 2 tablets (1,300 mg total) by mouth 3 (three) times daily. Take during  menses for a maximum of five days, Disp: 30 tablet, Rfl: 2   No Known Allergies  Past Medical History:  Diagnosis Date   Anemia    Asthma    Bursitis of hip    Cardiomegaly    Chondromalacia of hip    Chondromalacia of right knee    COPD (chronic obstructive pulmonary disease) (HCC)    GERD (gastroesophageal reflux disease)    H/O transfusion of packed red blood cells    Tinnitus      Past Surgical History:  Procedure Laterality Date   CERVICAL CONIZATION W/BX N/A 06/25/2019   Procedure: CONIZATION CERVIX WITH BIOPSY - COLD KNIFE;  Surgeon: Emily Filbert, MD;  Location: [facility];  Service: Gynecology;  Laterality: N/A;   ENDOMETRIAL ABLATION N/A 06/25/2019   Procedure: MINERVA ABLATION;  Surgeon: Emily Filbert, MD;  Location: [facility];  Service: Gynecology;  Laterality: N/A;  [employer] rep will here confirmed on 06/20/19 CS   HYSTEROSCOPY WITH D & C  06/25/2019   Procedure: DILATATION AND CURETTAGE /HYSTEROSCOPY;  Surgeon: Emily Filbert, MD;  Location: [facility];  Service: Gynecology;;   NO PAST SURGERIES      Family History  Problem Relation Age of Onset   Sudden death Father 43    Social History   Tobacco Use   Smoking status: Never  Smokeless tobacco: Never  Vaping Use   Vaping Use: Never used  Substance Use Topics   Alcohol use: Yes    Comment: rare   Drug use: Yes    Types: Marijuana    ROS   Objective:   Vitals: BP 137/88 (BP Location: Left Arm)   Pulse 92   Temp 98.1 F (36.7 C) (Oral)   Resp 18   SpO2 98%   Physical Exam Constitutional:      General: She is not in acute distress.    Appearance: Normal appearance. She is well-developed. She is not ill-appearing, toxic-appearing or diaphoretic.  HENT:     Head: Normocephalic and atraumatic.     Nose: Nose normal.     Mouth/Throat:     Mouth: Mucous membranes are moist.     Pharynx: Oropharynx is clear.  Eyes:     General: No scleral icterus.        Right eye: No discharge.        Left eye: No discharge.     Extraocular Movements: Extraocular movements intact.     Conjunctiva/sclera: Conjunctivae normal.     Pupils: Pupils are equal, round, and reactive to light.  Cardiovascular:     Rate and Rhythm: Normal rate.  Pulmonary:     Effort: Pulmonary effort is normal.  Musculoskeletal:       Hands:  Skin:    General: Skin is warm and dry.  Neurological:     General: No focal deficit present.     Mental Status: She is alert and oriented to person, place, and time.  Psychiatric:        Mood and Affect: Mood normal.        Behavior: Behavior normal.        Thought Content: Thought content normal.        Judgment: Judgment normal.      Assessment and Plan :   PDMP not reviewed this encounter.  1. Sprain of other site of right thumb, initial encounter   2. Pain of right thumb     Recommended conservative management.  Ace wrap applied to the right thumb.  Use naproxen for pain and inflammation. Counseled patient on potential for adverse effects with medications prescribed/recommended today, ER and return-to-clinic precautions discussed, patient verbalized understanding.    Jaynee Eagles, [place] 07/14/21 1925

## 2021-07-14 NOTE — ED Triage Notes (Signed)
Pt states was horse playing around with her son on Saturday and bent her rt thumb back and her rt ring finger.

## 2021-07-15 ENCOUNTER — Other Ambulatory Visit: Payer: Self-pay

## 2021-07-15 ENCOUNTER — Encounter: Payer: Self-pay | Admitting: Nurse Practitioner

## 2021-07-15 ENCOUNTER — Ambulatory Visit (INDEPENDENT_AMBULATORY_CARE_PROVIDER_SITE_OTHER): Payer: Medicaid Other | Admitting: Nurse Practitioner

## 2021-07-15 ENCOUNTER — Ambulatory Visit (HOSPITAL_COMMUNITY)
Admission: RE | Admit: 2021-07-15 | Discharge: 2021-07-15 | Disposition: A | Discharge status: Home / Self Care | Payer: Medicaid Other | Source: Ambulatory Visit | Attending: Nurse Practitioner | Admitting: Nurse Practitioner

## 2021-07-15 VITALS — BP 133/90 | HR 87 | Temp 98.1°F | Resp 18

## 2021-07-15 DIAGNOSIS — R0989 Other specified symptoms and signs involving the circulatory and respiratory systems: Secondary | ICD-10-CM | POA: Insufficient documentation

## 2021-07-15 DIAGNOSIS — R059 Cough, unspecified: Secondary | ICD-10-CM | POA: Diagnosis not present

## 2021-07-15 DIAGNOSIS — R051 Acute cough: Secondary | ICD-10-CM | POA: Diagnosis not present

## 2021-07-15 MED ORDER — OMEPRAZOLE 20 MG PO CPDR: 20.0000 mg | DELAYED_RELEASE_CAPSULE | Freq: Every day | ORAL | 3 refills | Status: DC

## 2021-07-15 MED ORDER — MONTELUKAST SODIUM 10 MG PO TABS: 10.0000 mg | ORAL_TABLET | Freq: Every day | ORAL | 3 refills | Status: DC

## 2021-07-15 MED ORDER — PREDNISONE 10 MG PO TABS: ORAL_TABLET | ORAL | 0 refills | Status: DC

## 2021-07-15 MED ORDER — ALBUTEROL SULFATE (2.5 MG/3ML) 0.083% IN NEBU: INHALATION_SOLUTION | RESPIRATORY_TRACT | 1 refills | Status: AC

## 2021-07-15 NOTE — Progress Notes (Signed)
@Patient  ID: Nancy Dickerson, female    DOB: December 04, 1980, 40 y.o.   MRN: [id number]  Chief Complaint  Patient presents with   Cough    Referring provider: Vevelyn Francois, NP  HPI  Patient presents today for acute visit.  Patient was seen in the ED on 07/13/2021.  She was treated for UTI.  She was given 1 g Rocephin injection and a prescription for Bactrim.  Patient was febrile on 07/13/2021.  She is afebrile today.  Vital signs are stable today.  Patient did complain of chest chest congestion and cough during the visit on 07/13/2021.  She did not get imaging.  Patient states that her cough and chest congestion have worsened she is having blood-tinged sputum when she coughs.  We discussed that we will send her for chest x-ray today.  We will order a round of steroids for her.  Patient does have a history of COPD and states that she has been more short of breath and is requesting a refill on her nebulizer treatments as well.  We will send a refill for this.  Patient was tested for COVID and flu and both were negative at her visit on 07/13/2021. Denies f/c/s, n/v/d, hemoptysis, PND, chest pain or edema.       No Known Allergies  Immunization History  Administered Date(s) Administered   Tdap 12/13/2020    Past Medical History:  Diagnosis Date   Anemia    Asthma    Bursitis of hip    Cardiomegaly    Chondromalacia of hip    Chondromalacia of right knee    COPD (chronic obstructive pulmonary disease) (HCC)    GERD (gastroesophageal reflux disease)    H/O transfusion of packed red blood cells    Tinnitus     Tobacco History: Social History   Tobacco Use  Smoking Status Never  Smokeless Tobacco Never   Counseling given: Yes   Outpatient Encounter Medications as of 07/15/2021  Medication Sig   montelukast (SINGULAIR) 10 MG tablet Take 1 tablet (10 mg total) by mouth at bedtime.   omeprazole (PRILOSEC) 20 MG capsule Take 1 capsule (20 mg total) by mouth daily.   predniSONE  (DELTASONE) 10 MG tablet Take 4 tabs for 2 days, then 3 tabs for 2 days, then 2 tabs for 2 days, then 1 tab for 2 days, then stop   albuterol (PROVENTIL) (2.5 MG/3ML) 0.083% nebulizer solution 2.5 mg every 6 (six) hours as needed.   cyclobenzaprine (FLEXERIL) 5 MG tablet Take 1 tablet (5 mg total) by mouth 3 (three) times daily as needed for muscle spasms.   DULoxetine (CYMBALTA) 20 MG capsule Take by mouth.   DULoxetine HCl 40 MG CPEP TAKE 1 CAPSULE BY MOUTH EVERY DAY   fluticasone (FLOVENT HFA) 110 MCG/ACT inhaler Inhale into the lungs 2 (two) times daily.   ibuprofen (ADVIL) 600 MG tablet Take 1 tablet (600 mg total) by mouth every 6 (six) hours as needed.   metoprolol tartrate (LOPRESSOR) 25 MG tablet Take 1 tablet (25 mg total) by mouth 2 (two) times daily.   Naltrexone-buPROPion HCl ER (CONTRAVE) 8-90 MG TB12 1 TAB TWICE DAILY X7 1 TAB TWICE DAILY X 7 2 TAB MORNING AND 1 EVENING X 7 AND 2 TABS TWICE DAILY UNTIL   naproxen (NAPROSYN) 500 MG tablet Take 1 tablet (500 mg total) by mouth 2 (two) times daily with a meal.   sulfamethoxazole-trimethoprim (BACTRIM DS) 800-160 MG tablet Take 1 tablet by mouth 2 (two)  times daily for 10 days.   tranexamic acid (LYSTEDA) 650 MG TABS tablet Take 2 tablets (1,300 mg total) by mouth 3 (three) times daily. Take during menses for a maximum of five days   [DISCONTINUED] albuterol (PROVENTIL) (2.5 MG/3ML) 0.083% nebulizer solution 2.5 mg every 6 (six) hours as needed.   [DISCONTINUED] levofloxacin (LEVAQUIN) 500 MG tablet Take 1 tablet by mouth daily.   No facility-administered encounter medications on file as of 07/15/2021.     Review of Systems  Review of Systems  Constitutional: Negative.   HENT: Negative.    Respiratory:  Positive for cough and shortness of breath.   Cardiovascular: Negative.   Gastrointestinal: Negative.   Allergic/Immunologic: Negative.   Neurological: Negative.   Psychiatric/Behavioral: Negative.        Physical Exam  BP  133/90   Pulse 87   Temp 98.1 F (36.7 C)   Resp 18   SpO2 98%   Wt Readings from Last 5 Encounters:  06/23/21 253 lb (114.8 kg)  05/23/21 255 lb (115.7 kg)  02/04/21 258 lb 3.2 oz (117.1 kg)  01/07/21 263 lb (119.3 kg)  11/29/20 263 lb 3.2 oz (119.4 kg)     Physical Exam Vitals and nursing note reviewed.  Constitutional:      General: She is not in acute distress.    Appearance: She is well-developed.  Cardiovascular:     Rate and Rhythm: Normal rate and regular rhythm.  Pulmonary:     Effort: Pulmonary effort is normal.     Breath sounds: Normal breath sounds.  Neurological:     Mental Status: She is alert and oriented to person, place, and time.      Assessment & Plan:   Acute cough Productive Cough Chest congestion:   Stay well hydrated  Stay active  Deep breathing exercises  May take tylenol or fever or pain  May take mucinex twice daily  Will order chest x ray: [facility] outpatient  Will order prednisone  Will order Singulair  Will order prilosec   Follow up:  Follow up if needed     Fenton Foy, NP 07/15/2021

## 2021-07-15 NOTE — Patient Instructions (Addendum)
Productive Cough Chest congestion:   Stay well hydrated  Stay active  Deep breathing exercises  May take tylenol or fever or pain  May take mucinex twice daily  Will order chest x ray: [facility] outpatient  Will order prednisone  Will order Singulair  Will order prilosec   Follow up:  Follow up if needed

## 2021-07-15 NOTE — Assessment & Plan Note (Signed)
Productive Cough Chest congestion:   Stay well hydrated  Stay active  Deep breathing exercises  May take tylenol or fever or pain  May take mucinex twice daily  Will order chest x ray: [facility] outpatient  Will order prednisone  Will order Singulair  Will order prilosec   Follow up:  Follow up if needed

## 2021-07-19 NOTE — Telephone Encounter (Signed)
Call to patient. States she has been very busy but will come sign ROI for [employer] on 23-30-07.  Encounter closed.

## 2021-07-25 ENCOUNTER — Ambulatory Visit: Payer: Medicaid Other | Admitting: Obstetrics and Gynecology

## 2021-08-07 ENCOUNTER — Ambulatory Visit
Admission: EM | Admit: 2021-08-07 | Discharge: 2021-08-07 | Disposition: A | Discharge status: Home / Self Care | Payer: Medicaid Other

## 2021-08-07 ENCOUNTER — Other Ambulatory Visit: Payer: Self-pay

## 2021-08-07 ENCOUNTER — Emergency Department (HOSPITAL_BASED_OUTPATIENT_CLINIC_OR_DEPARTMENT_OTHER): Payer: Medicaid Other

## 2021-08-07 ENCOUNTER — Encounter (HOSPITAL_BASED_OUTPATIENT_CLINIC_OR_DEPARTMENT_OTHER): Payer: Self-pay | Admitting: Obstetrics and Gynecology

## 2021-08-07 ENCOUNTER — Emergency Department (HOSPITAL_BASED_OUTPATIENT_CLINIC_OR_DEPARTMENT_OTHER)
Admission: EM | Admit: 2021-08-07 | Discharge: 2021-08-07 | Discharge status: Against Medical Advice | Payer: Medicaid Other | Attending: Emergency Medicine | Admitting: Emergency Medicine

## 2021-08-07 DIAGNOSIS — Z79899 Other long term (current) drug therapy: Secondary | ICD-10-CM | POA: Diagnosis not present

## 2021-08-07 DIAGNOSIS — M79661 Pain in right lower leg: Secondary | ICD-10-CM

## 2021-08-07 DIAGNOSIS — J45909 Unspecified asthma, uncomplicated: Secondary | ICD-10-CM | POA: Insufficient documentation

## 2021-08-07 DIAGNOSIS — J449 Chronic obstructive pulmonary disease, unspecified: Secondary | ICD-10-CM | POA: Insufficient documentation

## 2021-08-07 DIAGNOSIS — M7989 Other specified soft tissue disorders: Secondary | ICD-10-CM

## 2021-08-07 MED ORDER — HYDROCODONE-ACETAMINOPHEN 5-325 MG PO TABS
1.0000 | ORAL_TABLET | Freq: Once | ORAL | Status: DC
Start: 2021-08-07 — End: 2021-08-07

## 2021-08-07 MED ORDER — ACETAMINOPHEN 325 MG PO TABS: 650.0000 mg | ORAL_TABLET | Freq: Once | ORAL | Status: DC

## 2021-08-07 MED ORDER — METHOCARBAMOL 500 MG PO TABS: 500.0000 mg | ORAL_TABLET | Freq: Once | ORAL | Status: DC

## 2021-08-07 MED ORDER — KETOROLAC TROMETHAMINE 30 MG/ML IJ SOLN: 30.0000 mg | Freq: Once | INTRAMUSCULAR | Status: DC

## 2021-08-07 NOTE — ED Triage Notes (Signed)
Patient reports pain in the back of the left leg that she went to [facility] for. Patient reports to the ER following that visit and was told she needed an DVT rule out.

## 2021-08-07 NOTE — ED Provider Notes (Signed)
[name]    CSN: [id number] Arrival date & time: 08/07/21  0813      History   Chief Complaint Chief Complaint  Patient presents with   Leg Pain    HPI Nancy Dickerson is a 40 y.o. female.   Patient here today for evaluation of significant right lower leg pain that started this morning.  She states initially she felt a cramp in her lower right leg similar to a charley horse, but typically she states she can dorsiflex her foot and improve this but this morning this did not help.  She denies significant tenderness to touch, and is unable to walk on her right leg due to pain.  The history is provided by the patient.  Leg Pain Associated symptoms: no fever    Past Medical History:  Diagnosis Date   Anemia    Asthma    Bursitis of hip    Cardiomegaly    Chondromalacia of hip    Chondromalacia of right knee    COPD (chronic obstructive pulmonary disease) (HCC)    GERD (gastroesophageal reflux disease)    H/O transfusion of packed red blood cells    Tinnitus     Patient Active Problem List   Diagnosis Date Noted   Chest congestion 07/15/2021   Acute cough 07/15/2021   Tachycardia 06/01/2021   SOB (shortness of breath) 06/01/2021   Attention deficit hyperactivity disorder (ADHD), combined type 01/13/2021   Chronic obstructive pulmonary disease ([place]) 11/26/2020   Moderate episode of recurrent major depressive disorder ([place]) 10/14/2020   Generalized anxiety disorder 10/14/2020   Dysphonia 08/25/2020   Laryngopharyngeal reflux (LPR) 08/25/2020   Pulsatile tinnitus of right ear 08/25/2020   Dysfunctional uterine bleeding 06/03/2020   Patellofemoral pain syndrome of left knee 11/08/2019   Acute pain of left knee 08/13/2019   Insomnia 07/07/2019   Lumbar radicular pain 07/07/2019   Uterine leiomyoma 03/12/2018   Trochanteric bursitis of right hip 03/12/2018   Class 3 severe obesity due to excess calories without serious comorbidity with body mass index  (BMI) of 40.0 to 44.9 in adult ([place]) 03/12/2018   Iron deficiency anemia due to chronic blood loss 01/29/2018   Hypokalemia 01/29/2018   Right sided weakness 01/29/2018    Past Surgical History:  Procedure Laterality Date   CERVICAL CONIZATION W/BX N/A 06/25/2019   Procedure: CONIZATION CERVIX WITH BIOPSY - COLD KNIFE;  Surgeon: Emily Filbert, MD;  Location: [facility];  Service: Gynecology;  Laterality: N/A;   ENDOMETRIAL ABLATION N/A 06/25/2019   Procedure: MINERVA ABLATION;  Surgeon: Emily Filbert, MD;  Location: [facility];  Service: Gynecology;  Laterality: N/A;  [employer] rep will here confirmed on 06/20/19 CS   HYSTEROSCOPY WITH D & C  06/25/2019   Procedure: DILATATION AND CURETTAGE /HYSTEROSCOPY;  Surgeon: Emily Filbert, MD;  Location: [facility];  Service: Gynecology;;   NO PAST SURGERIES      OB History     Gravida  7   Para      Term      Preterm      AB  1   Living  6      SAB  1   IAB      Ectopic      Multiple      Live Births               Home Medications    Prior to Admission medications  Medication Sig Start Date End Date Taking? Authorizing Provider  albuterol (PROVENTIL) (2.5 MG/3ML) 0.083% nebulizer solution 2.5 mg every 6 (six) hours as needed. 07/15/21   Fenton Foy, NP  cyclobenzaprine (FLEXERIL) 5 MG tablet Take 1 tablet (5 mg total) by mouth 3 (three) times daily as needed for muscle spasms. 05/23/21   Vevelyn Francois, NP  DULoxetine (CYMBALTA) 20 MG capsule Take by mouth. 07/31/19   [provider]  DULoxetine HCl 40 MG CPEP TAKE 1 CAPSULE BY MOUTH EVERY DAY 05/18/21   Salley Slaughter, NP  fluticasone (FLOVENT HFA) 110 MCG/ACT inhaler Inhale into the lungs 2 (two) times daily.    Joline Salt, RN  ibuprofen (ADVIL) 600 MG tablet Take 1 tablet (600 mg total) by mouth every 6 (six) hours as needed. 04/24/21   Melynda Ripple, MD  metoprolol tartrate (LOPRESSOR) 25 MG tablet  Take 1 tablet (25 mg total) by mouth 2 (two) times daily. 12/31/20 03/31/21  Minus Breeding, MD  montelukast (SINGULAIR) 10 MG tablet Take 1 tablet (10 mg total) by mouth at bedtime. 07/15/21   Fenton Foy, NP  Naltrexone-buPROPion HCl ER (CONTRAVE) 8-90 MG TB12 1 TAB TWICE DAILY X7 1 TAB TWICE DAILY X 7 2 TAB MORNING AND 1 EVENING X 7 AND 2 TABS TWICE DAILY UNTIL 02/07/21   Vevelyn Francois, NP  naproxen (NAPROSYN) 500 MG tablet Take 1 tablet (500 mg total) by mouth 2 (two) times daily with a meal. 07/14/21   Jaynee Eagles, PA-C  omeprazole (PRILOSEC) 20 MG capsule Take 1 capsule (20 mg total) by mouth daily. 07/15/21   Fenton Foy, NP  predniSONE (DELTASONE) 10 MG tablet Take 4 tabs for 2 days, then 3 tabs for 2 days, then 2 tabs for 2 days, then 1 tab for 2 days, then stop 07/15/21   Fenton Foy, NP  tranexamic acid (LYSTEDA) 650 MG TABS tablet Take 2 tablets (1,300 mg total) by mouth 3 (three) times daily. Take during menses for a maximum of five days 06/23/21   Griffin Basil, MD    Family History Family History  Problem Relation Age of Onset   Sudden death Father 59    Social History Social History   Tobacco Use   Smoking status: Never   Smokeless tobacco: Never  Vaping Use   Vaping Use: Never used  Substance Use Topics   Alcohol use: Yes    Comment: rare   Drug use: Yes    Types: Marijuana     Allergies   Patient has no known allergies.   Review of Systems Review of Systems  Constitutional:  Negative for chills and fever.  Eyes:  Negative for discharge and redness.  Respiratory:  Negative for shortness of breath.   Gastrointestinal:  Negative for abdominal pain, nausea and vomiting.  Genitourinary:  Positive for vaginal bleeding and vaginal discharge.  Musculoskeletal:  Positive for myalgias.  Skin:  Negative for color change.    Physical Exam Triage Vital Signs ED Triage Vitals  Enc Vitals Group     BP 08/07/21 0831 130/86     Pulse Rate 08/07/21 0831  99     Resp 08/07/21 0831 18     Temp 08/07/21 0831 98.3 F (36.8 C)     Temp Source 08/07/21 0831 Oral     SpO2 08/07/21 0831 99 %     Weight --      Height --      Head Circumference --  Peak Flow --      Pain Score 08/07/21 0832 8     Pain Loc --      Pain Edu? --      Excl. in [place]? --    No data found.  Updated Vital Signs BP 130/86 (BP Location: Left Arm)   Pulse 99   Temp 98.3 F (36.8 C) (Oral)   Resp 18   SpO2 99%      Physical Exam Vitals and nursing note reviewed.  Constitutional:      General: She is not in acute distress.    Appearance: Normal appearance. She is not ill-appearing.  HENT:     Head: Normocephalic and atraumatic.  Eyes:     Conjunctiva/sclera: Conjunctivae normal.  Cardiovascular:     Rate and Rhythm: Normal rate.  Pulmonary:     Effort: Pulmonary effort is normal.  Musculoskeletal:     Comments: Mild swelling and significant tenderness to palpation to right lower posterior leg.  Pain also reproduced with dorsiflexion.  Neurological:     Mental Status: She is alert.  Psychiatric:        Mood and Affect: Mood normal.        Behavior: Behavior normal.        Thought Content: Thought content normal.     UC Treatments / Results  Labs (all labs ordered are listed, but only abnormal results are displayed) Labs Reviewed - No data to display  EKG   Radiology No results found.  Procedures Procedures (including critical care time)  Medications Ordered in UC Medications - No data to display  Initial Impression / Assessment and Plan / UC Course  I have reviewed the triage vital signs and the nursing notes.  Pertinent labs & imaging results that were available during my care of the patient were reviewed by me and considered in my medical decision making (see chart for details).  Discussed that patient would need ultrasound to rule out DVT.  Recommended further evaluation in the emergency department.  Patient is agreeable with  this plan.  Encouraged follow-up with any further concerns.  Final Clinical Impressions(s) / UC Diagnoses   Final diagnoses:  Pain and swelling of right lower leg     Discharge Instructions      Please report to ED for further evaluation. Will need rule out of blood clot causing symptoms.      ED Prescriptions   None    PDMP not reviewed this encounter.   Francene Finders, PA-C 08/07/21 [phone]

## 2021-08-07 NOTE — ED Notes (Signed)
Patient left and was not seen in room or in department, did not give ordered medications. Patient eloped

## 2021-08-07 NOTE — Discharge Instructions (Signed)
Please report to ED for further evaluation. Will need rule out of blood clot causing symptoms.

## 2021-08-07 NOTE — ED Provider Notes (Signed)
[facility] EMERGENCY DEPT Provider Note   CSN: [id number] Arrival date & time: 08/07/21  2585     History Chief Complaint  Patient presents with   Leg Pain    Nancy Dickerson is a 40 y.o. female.  This is a 40 y.o. female with significant medical history as below, including asthma who presents to the ED with complaint of right calf pain. Sent by UC for duplex.  Location:  right proximal calf Duration:  <24 hours Onset:  sudden Timing:  constant Description:  sharp, aching pain to left calf, no radiation "charley horse" Severity:  mild Exacerbating/Alleviating Factors:  worse with ambulation, palpation Associated Symptoms:  none Pertinent Negatives:  no leg swelling, no trauma, no numbness or tingling, no recent surgery, no knee or ankle pain, no hx dvt/pe    The history is provided by the patient. No language interpreter was used.  Leg Pain Associated symptoms: no fever       Past Medical History:  Diagnosis Date   Anemia    Asthma    Bursitis of hip    Cardiomegaly    Chondromalacia of hip    Chondromalacia of right knee    COPD (chronic obstructive pulmonary disease) (HCC)    GERD (gastroesophageal reflux disease)    H/O transfusion of packed red blood cells    Tinnitus     Patient Active Problem List   Diagnosis Date Noted   Chest congestion 07/15/2021   Acute cough 07/15/2021   Tachycardia 06/01/2021   SOB (shortness of breath) 06/01/2021   Attention deficit hyperactivity disorder (ADHD), combined type 01/13/2021   Chronic obstructive pulmonary disease ([place]) 11/26/2020   Moderate episode of recurrent major depressive disorder ([place]) 10/14/2020   Generalized anxiety disorder 10/14/2020   Dysphonia 08/25/2020   Laryngopharyngeal reflux (LPR) 08/25/2020   Pulsatile tinnitus of right ear 08/25/2020   Dysfunctional uterine bleeding 06/03/2020   Patellofemoral pain syndrome of left knee 11/08/2019   Acute pain of left knee 08/13/2019    Insomnia 07/07/2019   Lumbar radicular pain 07/07/2019   Uterine leiomyoma 03/12/2018   Trochanteric bursitis of right hip 03/12/2018   Class 3 severe obesity due to excess calories without serious comorbidity with body mass index (BMI) of 40.0 to 44.9 in adult ([place]) 03/12/2018   Iron deficiency anemia due to chronic blood loss 01/29/2018   Hypokalemia 01/29/2018   Right sided weakness 01/29/2018    Past Surgical History:  Procedure Laterality Date   CERVICAL CONIZATION W/BX N/A 06/25/2019   Procedure: CONIZATION CERVIX WITH BIOPSY - COLD KNIFE;  Surgeon: Emily Filbert, MD;  Location: [facility];  Service: Gynecology;  Laterality: N/A;   ENDOMETRIAL ABLATION N/A 06/25/2019   Procedure: MINERVA ABLATION;  Surgeon: Emily Filbert, MD;  Location: [facility];  Service: Gynecology;  Laterality: N/A;  Minerva rep will here confirmed on 06/20/19 CS   HYSTEROSCOPY WITH D & C  06/25/2019   Procedure: DILATATION AND CURETTAGE /HYSTEROSCOPY;  Surgeon: Emily Filbert, MD;  Location: [facility];  Service: Gynecology;;   NO PAST SURGERIES       OB History     Gravida  7   Para      Term      Preterm      AB  1   Living  6      SAB  1   IAB      Ectopic      Multiple  Live Births              Family History  Problem Relation Age of Onset   Sudden death Father 45    Social History   Tobacco Use   Smoking status: Never   Smokeless tobacco: Never  Vaping Use   Vaping Use: Never used  Substance Use Topics   Alcohol use: Yes    Comment: rare   Drug use: Yes    Types: Marijuana    Home Medications Prior to Admission medications   Medication Sig Start Date End Date Taking? Authorizing Provider  albuterol (PROVENTIL) (2.5 MG/3ML) 0.083% nebulizer solution 2.5 mg every 6 (six) hours as needed. 07/15/21   Fenton Foy, NP  cyclobenzaprine (FLEXERIL) 5 MG tablet Take 1 tablet (5 mg total) by mouth 3 (three) times daily as  needed for muscle spasms. 05/23/21   Vevelyn Francois, NP  DULoxetine (CYMBALTA) 20 MG capsule Take by mouth. 07/31/19   [provider]  DULoxetine HCl 40 MG CPEP TAKE 1 CAPSULE BY MOUTH EVERY DAY 05/18/21   Salley Slaughter, NP  fluticasone (FLOVENT HFA) 110 MCG/ACT inhaler Inhale into the lungs 2 (two) times daily.    Joline Salt, RN  ibuprofen (ADVIL) 600 MG tablet Take 1 tablet (600 mg total) by mouth every 6 (six) hours as needed. 04/24/21   Melynda Ripple, MD  metoprolol tartrate (LOPRESSOR) 25 MG tablet Take 1 tablet (25 mg total) by mouth 2 (two) times daily. 12/31/20 03/31/21  Minus Breeding, MD  montelukast (SINGULAIR) 10 MG tablet Take 1 tablet (10 mg total) by mouth at bedtime. 07/15/21   Fenton Foy, NP  Naltrexone-buPROPion HCl ER (CONTRAVE) 8-90 MG TB12 1 TAB TWICE DAILY X7 1 TAB TWICE DAILY X 7 2 TAB MORNING AND 1 EVENING X 7 AND 2 TABS TWICE DAILY UNTIL 02/07/21   Vevelyn Francois, NP  naproxen (NAPROSYN) 500 MG tablet Take 1 tablet (500 mg total) by mouth 2 (two) times daily with a meal. 07/14/21   Jaynee Eagles, PA-C  omeprazole (PRILOSEC) 20 MG capsule Take 1 capsule (20 mg total) by mouth daily. 07/15/21   Fenton Foy, NP  predniSONE (DELTASONE) 10 MG tablet Take 4 tabs for 2 days, then 3 tabs for 2 days, then 2 tabs for 2 days, then 1 tab for 2 days, then stop 07/15/21   Fenton Foy, NP  tranexamic acid (LYSTEDA) 650 MG TABS tablet Take 2 tablets (1,300 mg total) by mouth 3 (three) times daily. Take during menses for a maximum of five days 06/23/21   Griffin Basil, MD    Allergies    Patient has no known allergies.  Review of Systems   Review of Systems  Constitutional:  Negative for chills and fever.  HENT:  Negative for facial swelling and trouble swallowing.   Eyes:  Negative for photophobia and visual disturbance.  Respiratory:  Negative for cough and shortness of breath.   Cardiovascular:  Negative for chest pain and palpitations.   Gastrointestinal:  Negative for abdominal pain, nausea and vomiting.  Endocrine: Negative for polydipsia and polyuria.  Genitourinary:  Negative for difficulty urinating and hematuria.  Musculoskeletal:  Positive for myalgias. Negative for gait problem and joint swelling.  Skin:  Negative for pallor and rash.  Neurological:  Negative for syncope and headaches.  Psychiatric/Behavioral:  Negative for agitation and confusion.    Physical Exam Updated Vital Signs BP 128/80 (BP Location: Right Arm)   Pulse  88   Temp 98.3 F (36.8 C)   Resp 16   SpO2 100%   Physical Exam Vitals and nursing note reviewed.  Constitutional:      General: She is not in acute distress.    Appearance: Normal appearance.  HENT:     Head: Normocephalic and atraumatic.     Right Ear: External ear normal.     Left Ear: External ear normal.     Nose: Nose normal.     Mouth/Throat:     Mouth: Mucous membranes are moist.  Eyes:     General: No scleral icterus.       Right eye: No discharge.        Left eye: No discharge.  Cardiovascular:     Rate and Rhythm: Normal rate and regular rhythm.     Pulses: Normal pulses.     Heart sounds: Normal heart sounds.  Pulmonary:     Effort: Pulmonary effort is normal. No respiratory distress.     Breath sounds: Normal breath sounds.  Abdominal:     General: Abdomen is flat.     Tenderness: There is no abdominal tenderness.  Musculoskeletal:        General: Normal range of motion.     Cervical back: Normal range of motion.     Right lower leg: No edema.     Left lower leg: No edema.       Legs:  Skin:    General: Skin is warm and dry.     Capillary Refill: Capillary refill takes less than 2 seconds.  Neurological:     Mental Status: She is alert.  Psychiatric:        Mood and Affect: Mood normal.        Behavior: Behavior normal.  md  ED Results / Procedures / Treatments   Labs (all labs ordered are listed, but only abnormal results are  displayed) Labs Reviewed - No data to display  EKG None  Radiology US Venous Img Lower Unilateral Right  Result Date: 08/07/2021 CLINICAL DATA:  40 year old female with RIGHT LOWER extremity pain and swelling. EXAM: RIGHT LOWER EXTREMITY VENOUS DOPPLER ULTRASOUND TECHNIQUE: Presli Fanguy-scale sonography with compression, as well as color and duplex ultrasound, were performed to evaluate the deep venous system(s) from the level of the common femoral vein through the popliteal and proximal calf veins. COMPARISON:  None. FINDINGS: VENOUS Normal compressibility of the common femoral, superficial femoral, and popliteal veins, as well as the visualized calf veins. Visualized portions of profunda femoral vein and great saphenous vein unremarkable. No filling defects to suggest DVT on grayscale or color Doppler imaging. Doppler waveforms show normal direction of venous flow, normal respiratory plasticity and response to augmentation. Limited views of the contralateral common femoral vein are unremarkable. OTHER None. Limitations: none IMPRESSION: Negative. Electronically Signed   By: Margarette Canada M.D.   On: 08/07/2021 12:03    Procedures Procedures   Medications Ordered in ED Medications  HYDROcodone-acetaminophen (NORCO/VICODIN) 5-325 MG per tablet 1 tablet (has no administration in time range)  ketorolac (TORADOL) 30 MG/ML injection 30 mg (has no administration in time range)  acetaminophen (TYLENOL) tablet 650 mg (has no administration in time range)  methocarbamol (ROBAXIN) tablet 500 mg (has no administration in time range)    ED Course  I have reviewed the triage vital signs and the nursing notes.  Pertinent labs & imaging results that were available during my care of the patient were reviewed by me and considered  in my medical decision making (see chart for details).    MDM Rules/Calculators/A&P                          CC: right calf pain  This patient complains of above; this involves an  extensive number of treatment options and is a complaint that carries with it a high risk of complications and morbidity. Vital signs were reviewed. Serious etiologies considered.  Record review:  Previous records obtained and reviewed  Work up as above, notable for:   Imaging results that were available during my care of the patient were reviewed by me and considered in my medical decision making.   Le duplex was reviewed, no evidence of acute DVT..  D/w patient regarding negative imaging, feel this is likely msk in nature. Will trial oral analgesics, muscle relaxer and see if symptoms improve. Her PE is stable, no further imaging necessary at this time.  After ordering medications for patient I went to go and re-assess her, nursing reports patient was observed leaving the ED with family member. LWCS after eval.   She did not receive discharge instructions or return precautions, charge nurse notified.        This chart was dictated using voice recognition software.  Despite best efforts to proofread,  errors can occur which can change the documentation meaning.  Final Clinical Impression(s) / ED Diagnoses Final diagnoses:  Right calf pain    Rx / DC Orders ED Discharge Orders     None        Jeanell Sparrow, DO 08/07/21 1659

## 2021-08-07 NOTE — ED Triage Notes (Signed)
Pt present right leg pain, pt states this morning it felt like a charlie horse in the back of her leg,and now its tender to touch with limit movement.

## 2021-08-08 ENCOUNTER — Telehealth: Payer: Self-pay

## 2021-08-08 NOTE — Telephone Encounter (Signed)
Transition Care Management Follow-up Telephone Call Date of discharge and from where: 08/07/2021 from [facility] How have you been since you were released from the hospital? Pt stated that she is feeling better and did not have any questions at this time.  Any questions or concerns? No  Items Reviewed: Did the pt receive and understand the discharge instructions provided? Yes  Medications obtained and verified? Yes  Other? No  Any new allergies since your discharge? No  Dietary orders reviewed? No Do you have support at home? Yes   Functional Questionnaire: (I = Independent and D = Dependent) ADLs: I  Bathing/Dressing- I  Meal Prep- I  Eating- I  Maintaining continence- I  Transferring/Ambulation- I  Managing Meds- I  Follow up appointments reviewed:  PCP Hospital f/u appt confirmed? No   Specialist Hospital f/u appt confirmed? No   Are transportation arrangements needed? No  If their condition worsens, is the pt aware to call PCP or go to the Emergency Dept.? Yes Was the patient provided with contact information for the PCP's office or ED? Yes Was to pt encouraged to call back with questions or concerns? Yes

## 2021-08-09 ENCOUNTER — Encounter: Payer: Medicaid Other | Admitting: Nurse Practitioner

## 2021-08-09 ENCOUNTER — Other Ambulatory Visit: Payer: Self-pay

## 2021-08-09 ENCOUNTER — Encounter: Payer: Self-pay | Admitting: Nurse Practitioner

## 2021-08-09 ENCOUNTER — Telehealth (INDEPENDENT_AMBULATORY_CARE_PROVIDER_SITE_OTHER): Payer: Medicaid Other | Admitting: Nurse Practitioner

## 2021-08-09 ENCOUNTER — Ambulatory Visit: Payer: Medicaid Other | Admitting: Nurse Practitioner

## 2021-08-09 DIAGNOSIS — R0989 Other specified symptoms and signs involving the circulatory and respiratory systems: Secondary | ICD-10-CM

## 2021-08-09 DIAGNOSIS — J45909 Unspecified asthma, uncomplicated: Secondary | ICD-10-CM

## 2021-08-09 NOTE — Patient Instructions (Addendum)
Chest Congestion Asthma:   Stay well hydrated  Stay active  Deep breathing exercises  May take tylenol for fever or pain  May take mucinex DM twice daily  Will place referral to pulmonary  Right Calf Pain:  May try ice packs  Continue flexeril  Stay active  Gentle stretching    Follow up:  Follow up with PCP

## 2021-08-09 NOTE — Progress Notes (Signed)
Virtual Visit via Video Note  I connected with [name] on 08/09/21 at  3:00 PM EDT by a video enabled telemedicine application and verified that I am speaking with the correct person using two identifiers.  Location: Patient: home Provider: office   I discussed the limitations of evaluation and management by telemedicine and the availability of in person appointments. The patient expressed understanding and agreed to proceed.  History of Present Illness:  Patient presents today for ED follow-up.  Patient was seen in the ED on 08/07/2021.  Patient did have ultrasound which showed no DVT.  Patient left the ED AMA.  Patient states that she does have muscle relaxers at home that she has been taking.  She also complains today of chronic ongoing congestion with productive cough.  She has had a chest x-ray that x-ray that was clear.  We will place a referral to pulmonary for further evaluation.  Patient is noted to have past history of asthma.  Denies n/v/d, hemoptysis.    Observations/Objective:  Vitals with BMI 08/07/2021 08/07/2021 08/07/2021  Height - - -  Weight - - -  BMI - - -  Systolic 353 299 242  Diastolic 80 85 86  Pulse 88 99 99      Assessment and Plan:  Chest Congestion Asthma:   Stay well hydrated  Stay active  Deep breathing exercises  May take tylenol for fever or pain  May take mucinex DM twice daily  Will place referral to pulmonary  Right Calf Pain:  May try ice packs  Continue flexeril  Stay active  Gentle stretching    Follow up:  Follow up with PCP      I discussed the assessment and treatment plan with the patient. The patient was provided an opportunity to ask questions and all were answered. The patient agreed with the plan and demonstrated an understanding of the instructions.   The patient was advised to call back or seek an in-person evaluation if the symptoms worsen or if the condition fails to improve as  anticipated.  I provided 23 minutes of non-face-to-face time during this encounter.   Fenton Foy, NP

## 2021-08-17 ENCOUNTER — Telehealth (INDEPENDENT_AMBULATORY_CARE_PROVIDER_SITE_OTHER): Payer: Medicaid Other | Admitting: Psychiatry

## 2021-08-17 ENCOUNTER — Encounter (HOSPITAL_COMMUNITY): Payer: Self-pay | Admitting: Psychiatry

## 2021-08-17 DIAGNOSIS — G4700 Insomnia, unspecified: Secondary | ICD-10-CM

## 2021-08-17 DIAGNOSIS — F411 Generalized anxiety disorder: Secondary | ICD-10-CM

## 2021-08-17 DIAGNOSIS — F331 Major depressive disorder, recurrent, moderate: Secondary | ICD-10-CM | POA: Diagnosis not present

## 2021-08-17 MED ORDER — ZOLPIDEM TARTRATE 5 MG PO TABS: 5.0000 mg | ORAL_TABLET | Freq: Every evening | ORAL | 2 refills | Status: DC | PRN

## 2021-08-17 MED ORDER — DULOXETINE HCL 40 MG PO CPEP: 1.0000 | ORAL_CAPSULE | Freq: Every day | ORAL | 3 refills | Status: DC

## 2021-08-17 NOTE — Progress Notes (Addendum)
BH MD/PA/NP OP Progress Note Virtual Visit via Video Note  I connected with Nancy Dickerson on 08/17/2021 at  9:30 AM EST by a video enabled telemedicine application and verified that I am speaking with the correct person using two identifiers.  Location: Patient: Home Provider: Clinic   I discussed the limitations of evaluation and management by telemedicine and the availability of in person appointments. The patient expressed understanding and agreed to proceed.  I provided 30 minutes of non-face-to-face time during this encounter.    08/17/2021 10:31 AM Nancy Dickerson  MRN:  [id number]  Chief Complaint:  "I'm not sleeping"  HPI: 40 year old female seen today for follow up psychiatric evaluation.  She has psychiatric history of anxiety, depression, and insomnia. She is currently managed on Cymbalta 40 mg daily. She notes that  her medications are somewhat effective in managing her psychiatric conditions.  Today, patient was well groomed, pleasant, cooperative, engaged in conversation and maintained eye contact. She informed provider that since her last visit, she has not been sleeping. She notes that she sleeps approximately 4-5 hours nightly. Patient notes that she attempted to take old trazodone but notes that it caused her to feel drowsy so she discontinued it. She informed Probation officer that in the past she used Ambien and notes that it was effective.  Patient also informed writer that recently her 13 year old son moved back in with her.  She informed Probation officer that things are going well.  She notes that she and her daughter still continue to have issues but reports that they talk on occasions.  She notes recently she took all 3 of her children to church with her which made her happy.    Patient informed Probation officer that most days she lacks energy.  She states that she spends all day in her room noting that she works from home in her room.  She informed Probation officer that her birthday will be coming up in a few  days and notes that her son is making her celebrate herself by throwing a party.  She informed Probation officer that she is looking forward to this however notes that she is unsure if she will be happy or have the energy to celebrate.  Patient notes that she needed to return to work so that assessment was cut short.  She informed Probation officer that she finds Cymbalta effective for her leg pain but not her anxiety or depression.  A GAD-7 and PHQ-9 was not conducted at this time but will be done at her next visit.  Today she did deny SI/HI/VAH, mania, or paranoia.   Patient notes that she is still interested in GeneSight testing.  An order was placed and a kit will be sent to patient's home.  Provider informed patient to call her in a few weeks to discuss results.  She endorsed understanding and agreed.  Today she is agreeable to starting Ambien 5 mg to help manage sleep.  She will continue Cymbalta as prescribed.  Other adjustments can be made to medications once GeneSight testing results is in. No other concerns noted at this time.    Visit Diagnosis:    ICD-10-CM   1. Insomnia, unspecified type  G47.00 zolpidem (AMBIEN) 5 MG tablet    2. Generalized anxiety disorder  F41.1 DULoxetine HCl 40 MG CPEP    3. Moderate episode of recurrent major depressive disorder (HCC)  F33.1 DULoxetine HCl 40 MG CPEP      Past Psychiatric History: Anxiety, Depression, and insomnia  Past Medical History:  Past Medical History:  Diagnosis Date   Anemia    Asthma    Bursitis of hip    Cardiomegaly    Chondromalacia of hip    Chondromalacia of right knee    COPD (chronic obstructive pulmonary disease) (HCC)    GERD (gastroesophageal reflux disease)    H/O transfusion of packed red blood cells    Tinnitus     Past Surgical History:  Procedure Laterality Date   CERVICAL CONIZATION W/BX N/A 06/25/2019   Procedure: CONIZATION CERVIX WITH BIOPSY - COLD KNIFE;  Surgeon: Emily Filbert, MD;  Location: [place];   Service: Gynecology;  Laterality: N/A;   ENDOMETRIAL ABLATION N/A 06/25/2019   Procedure: MINERVA ABLATION;  Surgeon: Emily Filbert, MD;  Location: [place];  Service: Gynecology;  Laterality: N/A;  [employer] rep will here confirmed on 06/20/19 CS   HYSTEROSCOPY WITH D & C  06/25/2019   Procedure: DILATATION AND CURETTAGE /HYSTEROSCOPY;  Surgeon: Emily Filbert, MD;  Location: [place];  Service: Gynecology;;   NO PAST SURGERIES      Family Psychiatric History: Daughter SA, Mother Bipolar disorder, and sister Bipolar disorder  Family History:  Family History  Problem Relation Age of Onset   Sudden death Father 67    Social History:  Social History   Socioeconomic History   Marital status: Single    Spouse name: Not on file   Number of children: 6   Years of education: 12   Highest education level: Not on file  Occupational History   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  Tobacco Use   Smoking status: Never   Smokeless tobacco: Never  Vaping Use   Vaping Use: Never used  Substance and Sexual Activity   Alcohol use: Yes    Comment: rare   Drug use: Yes    Types: Marijuana   Sexual activity: Yes  Other Topics Concern   Not on file  Social History Narrative   ** Merged History Encounter **         Lives with someone   Right handed    Little caffeine - drinks Sprite   Education - some college          Social Determinants of Health   Financial Resource Strain: Not on file  Food Insecurity: Food Insecurity Present   Worried About Charity fundraiser in the Last Year: Sometimes true   Arboriculturist in the Last Year: Not on file  Transportation Needs: Not on file  Physical Activity: Not on file  Stress: Not on file  Social Connections: Not on file    Allergies: No Known Allergies  Metabolic Disorder Labs: Lab Results  Component Value Date   HGBA1C 5.7 (H) 06/03/2020   MPG 99.67 01/30/2018   No results found for: PROLACTIN Lab Results   Component Value Date   CHOL 115 01/30/2018   TRIG 89 01/30/2018   HDL 39 (L) 01/30/2018   CHOLHDL 2.9 01/30/2018   VLDL 18 01/30/2018   LDLCALC 58 01/30/2018   Lab Results  Component Value Date   TSH 0.768 07/08/2018    Therapeutic Level Labs: No results found for: LITHIUM No results found for: VALPROATE No components found for:  CBMZ  Current Medications: Current Outpatient Medications  Medication Sig Dispense Refill   albuterol (PROVENTIL) (2.5 MG/3ML) 0.083% nebulizer solution 2.5 mg every 6 (six) hours as needed. 75 mL 1   cyclobenzaprine (FLEXERIL)  5 MG tablet Take 1 tablet (5 mg total) by mouth 3 (three) times daily as needed for muscle spasms. 30 tablet 1   DULoxetine HCl 40 MG CPEP Take 1 tablet by mouth daily. 30 capsule 3   fluticasone (FLOVENT HFA) 110 MCG/ACT inhaler Inhale into the lungs 2 (two) times daily.     ibuprofen (ADVIL) 600 MG tablet Take 1 tablet (600 mg total) by mouth every 6 (six) hours as needed. 30 tablet 0   metoprolol tartrate (LOPRESSOR) 25 MG tablet Take 1 tablet (25 mg total) by mouth 2 (two) times daily. 180 tablet 3   montelukast (SINGULAIR) 10 MG tablet Take 1 tablet (10 mg total) by mouth at bedtime. 30 tablet 3   Naltrexone-buPROPion HCl ER (CONTRAVE) 8-90 MG TB12 1 TAB TWICE DAILY X7 1 TAB TWICE DAILY X 7 2 TAB MORNING AND 1 EVENING X 7 AND 2 TABS TWICE DAILY UNTIL 120 tablet 0   naproxen (NAPROSYN) 500 MG tablet Take 1 tablet (500 mg total) by mouth 2 (two) times daily with a meal. 30 tablet 0   omeprazole (PRILOSEC) 20 MG capsule Take 1 capsule (20 mg total) by mouth daily. 30 capsule 3   predniSONE (DELTASONE) 10 MG tablet Take 4 tabs for 2 days, then 3 tabs for 2 days, then 2 tabs for 2 days, then 1 tab for 2 days, then stop 20 tablet 0   tranexamic acid (LYSTEDA) 650 MG TABS tablet Take 2 tablets (1,300 mg total) by mouth 3 (three) times daily. Take during menses for a maximum of five days 30 tablet 2   zolpidem (AMBIEN) 5 MG tablet Take  1 tablet (5 mg total) by mouth at bedtime as needed for sleep. 30 tablet 2   No current facility-administered medications for this visit.     Musculoskeletal: Strength & Muscle Tone:  Unable to assess due to telehealth visit [place]:  Unable to assess due to telehealth visit Patient leans: N/A  Psychiatric Specialty Exam: Review of Systems  There were no vitals taken for this visit.There is no height or weight on file to calculate BMI.  General Appearance: Well Groomed  Eye Contact:  Good  Speech:  Clear and Coherent and Normal Rate  Volume:  Normal  Mood:  Anxious and Depressed  Affect:  Appropriate and Congruent  Thought Process:  Coherent, Goal Directed and Linear  Orientation:  Full (Time, Place, and Person)  Thought Content: WDL and Logical   Suicidal Thoughts:  No  Homicidal Thoughts:  No  Memory:  Immediate;   Good Recent;   Good Remote;   Good  Judgement:  Good  Insight:  Good  Psychomotor Activity:  Normal  Concentration:  Concentration: Good and Attention Span: Good  Recall:  Good  Fund of Knowledge: Good  Language: Good  Akathisia:  No  Handed:  Right  AIMS (if indicated): Not done  Assets:  Communication Skills Desire for Improvement Financial Resources/Insurance Housing Intimacy Physical Health Social Support  ADL's:  Intact  Cognition: WNL  Sleep:  Poor   Screenings: GAD-7    Flowsheet Row Office Visit from 06/23/2021 in [facility] [place] [facility] Video Visit from 05/18/2021 in [facility] Video Visit from 02/15/2021 in [facility] Video Visit from 01/12/2021 in [facility] Video Visit from 10/14/2020 in [facility]  Total GAD-7 Score 9 14 7 10  16  [place] Office Visit from 06/23/2021 in [facility] [place] [facility] Video Visit from  05/18/2021 in [facility] Video Visit from 02/15/2021 in [facility] Video Visit from 01/12/2021 in [facility] Video Visit from 10/14/2020 in [facility]  PHQ-2 Total Score 2 5 4 3 3   PHQ-9 Total Score 9 15 12 12 14       [place] ED from 08/07/2021 in [facility] Most recent reading at 08/07/2021 10:06 AM ED from 08/07/2021 in [facility] at [facility]  Most recent reading at 08/07/2021  8:33 AM ED from 07/14/2021 in [facility] at [facility]  Most recent reading at 07/14/2021  6:51 PM  C-SSRS RISK CATEGORY No Risk No Risk No Risk        Assessment and Plan: Patient notes that her anxiety and depression is not managed with Cymbalta.  She also notes that she is experiencing insomnia. Patient notes that she is still interested in GeneSight testing.  An order was placed and a kit will be sent to patient's home.  Provider informed patient to call her in a few weeks to discuss results.  She endorsed understanding and agreed.  Today she is agreeable to starting Ambien 5 mg to help manage sleep.  She will continue Cymbalta as prescribed.  Other adjustments can be made to medications once GeneSight testing results is in.  1. Generalized anxiety disorder  Continue- DULoxetine HCl 40 MG CPEP; Take 1 tablet by mouth daily.  Dispense: 30 capsule; Refill: 3  2. Moderate episode of recurrent major depressive disorder (HCC)  Continue- DULoxetine HCl 40 MG CPEP; Take 1 tablet by mouth daily.  Dispense: 30 capsule; Refill: 3  3. Insomnia, unspecified type  Start- zolpidem (AMBIEN) 5 MG tablet; Take 1 tablet (5 mg total) by mouth at bedtime as needed for sleep.  Dispense: 30 tablet; Refill: 2    Follow up in 3 months  Salley Slaughter, NP 08/17/2021, 10:31 AM

## 2021-09-08 ENCOUNTER — Ambulatory Visit (INDEPENDENT_AMBULATORY_CARE_PROVIDER_SITE_OTHER): Payer: Medicaid Other | Admitting: Nurse Practitioner

## 2021-09-08 ENCOUNTER — Other Ambulatory Visit: Payer: Self-pay

## 2021-09-08 ENCOUNTER — Encounter: Payer: Self-pay | Admitting: Nurse Practitioner

## 2021-09-08 ENCOUNTER — Ambulatory Visit (HOSPITAL_COMMUNITY)
Admission: RE | Admit: 2021-09-08 | Discharge: 2021-09-08 | Disposition: A | Discharge status: Home / Self Care | Payer: Medicaid Other | Source: Ambulatory Visit | Attending: Obstetrics and Gynecology | Admitting: Obstetrics and Gynecology

## 2021-09-08 VITALS — BP 137/83 | HR 80 | Temp 97.9°F | Ht 64.0 in | Wt 257.4 lb

## 2021-09-08 DIAGNOSIS — R000 Tachycardia, unspecified: Secondary | ICD-10-CM

## 2021-09-08 DIAGNOSIS — R5383 Other fatigue: Secondary | ICD-10-CM

## 2021-09-08 DIAGNOSIS — N938 Other specified abnormal uterine and vaginal bleeding: Secondary | ICD-10-CM | POA: Diagnosis not present

## 2021-09-08 DIAGNOSIS — R030 Elevated blood-pressure reading, without diagnosis of hypertension: Secondary | ICD-10-CM | POA: Diagnosis not present

## 2021-09-08 DIAGNOSIS — D251 Intramural leiomyoma of uterus: Secondary | ICD-10-CM | POA: Diagnosis not present

## 2021-09-08 DIAGNOSIS — R9389 Abnormal findings on diagnostic imaging of other specified body structures: Secondary | ICD-10-CM | POA: Diagnosis not present

## 2021-09-08 DIAGNOSIS — Z13220 Encounter for screening for lipoid disorders: Secondary | ICD-10-CM | POA: Diagnosis not present

## 2021-09-08 DIAGNOSIS — N8500 Endometrial hyperplasia, unspecified: Secondary | ICD-10-CM | POA: Diagnosis not present

## 2021-09-08 MED ORDER — FLUTICASONE PROPIONATE HFA 110 MCG/ACT IN AERO: 2.0000 | INHALATION_SPRAY | Freq: Two times a day (BID) | RESPIRATORY_TRACT | 1 refills | Status: DC

## 2021-09-08 MED ORDER — ALBUTEROL SULFATE HFA 108 (90 BASE) MCG/ACT IN AERS: 2.0000 | INHALATION_SPRAY | Freq: Four times a day (QID) | RESPIRATORY_TRACT | 2 refills | Status: AC | PRN

## 2021-09-08 NOTE — Progress Notes (Signed)
[place], [place]  [address] Phone:  [phone]   Fax:  [phone]   Established Patient Office Visit  Subjective:  Patient ID: Nancy Dickerson, female    DOB: 07-Oct-1981  Age: 40 y.o. MRN: [id number]  CC:  Chief Complaint  Patient presents with   Follow-up    Pt is here for her blood pressure being elevated since thanksgiving. Pt states that she has been having headaches, dizziness, blurred vision, feeling very fatigue, nausea, and no appetite.  Pt states that her blood pressure has been ranging 163/91 or higher    HPI Nancy Dickerson presents for elevated blood pressure.  She  has a past medical history of Anemia, Asthma, Bursitis of hip, Cardiomegaly, Chondromalacia of hip, Chondromalacia of right knee, COPD (chronic obstructive pulmonary disease) ([place]), GERD (gastroesophageal reflux disease), H/O transfusion of packed red blood cells, and Tinnitus.   She has a history of elevated blood pressure and tachycardia.  She was started on metoprolol 25 mg twice daily by cardiology in March.  She reports that she has not been taking this medication.  However she does work from home.  She reports symptoms of headaches, dizziness, blurred vision, fatigue and nausea. She has had a 4 pound weight gain.  She does try to monitor her diet.    Past Medical History:  Diagnosis Date   Anemia    Asthma    Bursitis of hip    Cardiomegaly    Chondromalacia of hip    Chondromalacia of right knee    COPD (chronic obstructive pulmonary disease) (HCC)    GERD (gastroesophageal reflux disease)    H/O transfusion of packed red blood cells    Tinnitus     Past Surgical History:  Procedure Laterality Date   CERVICAL CONIZATION W/BX N/A 06/25/2019   Procedure: CONIZATION CERVIX WITH BIOPSY - COLD KNIFE;  Surgeon: Emily Filbert, MD;  Location: [place];  Service: Gynecology;  Laterality: N/A;   ENDOMETRIAL ABLATION N/A 06/25/2019    Procedure: MINERVA ABLATION;  Surgeon: Emily Filbert, MD;  Location: [place];  Service: Gynecology;  Laterality: N/A;  [employer] rep will here confirmed on 06/20/19 CS   HYSTEROSCOPY WITH D & C  06/25/2019   Procedure: DILATATION AND CURETTAGE /HYSTEROSCOPY;  Surgeon: Emily Filbert, MD;  Location: [place];  Service: Gynecology;;   NO PAST SURGERIES      Family History  Problem Relation Age of Onset   Sudden death Father 93    Social History   Socioeconomic History   Marital status: Single    Spouse name: Not on file   Number of children: 6   Years of education: 12   Highest education level: Not on file  Occupational History   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  Tobacco Use   Smoking status: Never   Smokeless tobacco: Never  Vaping Use   Vaping Use: Never used  Substance and Sexual Activity   Alcohol use: Yes    Comment: rare   Drug use: Yes    Types: Marijuana   Sexual activity: Yes  Other Topics Concern   Not on file  Social History Narrative   ** Merged History Encounter **         Lives with someone   Right handed    Little caffeine - drinks Sprite   Education - some college  Social Determinants of Radio broadcast assistant Strain: Not on file  Food Insecurity: Food Insecurity Present   Worried About Charity fundraiser in the Last Year: Sometimes true   Arboriculturist in the Last Year: Not on file  Transportation Needs: Not on file  Physical Activity: Not on file  Stress: Not on file  Social Connections: Not on file  Intimate Partner Violence: Not on file    Outpatient Medications Prior to Visit  Medication Sig Dispense Refill   albuterol (PROVENTIL) (2.5 MG/3ML) 0.083% nebulizer solution 2.5 mg every 6 (six) hours as needed. 75 mL 1   DULoxetine HCl 40 MG CPEP Take 1 tablet by mouth daily. 30 capsule 3   montelukast (SINGULAIR) 10 MG tablet Take 1 tablet (10 mg total) by mouth at bedtime. 30 tablet 3   Multiple Vitamin  (MULTI-VITAMIN) tablet Take by mouth.     omeprazole (PRILOSEC) 20 MG capsule Take 1 capsule (20 mg total) by mouth daily. 30 capsule 3   zolpidem (AMBIEN) 5 MG tablet Take 1 tablet (5 mg total) by mouth at bedtime as needed for sleep. 30 tablet 2   cyclobenzaprine (FLEXERIL) 5 MG tablet Take 1 tablet (5 mg total) by mouth 3 (three) times daily as needed for muscle spasms. 30 tablet 1   fluticasone (FLOVENT HFA) 110 MCG/ACT inhaler Inhale into the lungs 2 (two) times daily.     ibuprofen (ADVIL) 600 MG tablet Take 1 tablet (600 mg total) by mouth every 6 (six) hours as needed. 30 tablet 0   metoprolol tartrate (LOPRESSOR) 25 MG tablet Take 1 tablet (25 mg total) by mouth 2 (two) times daily. 180 tablet 3   naproxen (NAPROSYN) 500 MG tablet Take 1 tablet (500 mg total) by mouth 2 (two) times daily with a meal. (Patient not taking: Reported on 09/08/2021) 30 tablet 0   tranexamic acid (LYSTEDA) 650 MG TABS tablet Take 2 tablets (1,300 mg total) by mouth 3 (three) times daily. Take during menses for a maximum of five days (Patient not taking: Reported on 09/08/2021) 30 tablet 2   Naltrexone-buPROPion HCl ER (CONTRAVE) 8-90 MG TB12 1 TAB TWICE DAILY X7 1 TAB TWICE DAILY X 7 2 TAB MORNING AND 1 EVENING X 7 AND 2 TABS TWICE DAILY UNTIL (Patient not taking: Reported on 09/08/2021) 120 tablet 0   predniSONE (DELTASONE) 10 MG tablet Take 4 tabs for 2 days, then 3 tabs for 2 days, then 2 tabs for 2 days, then 1 tab for 2 days, then stop (Patient not taking: Reported on 09/08/2021) 20 tablet 0   No facility-administered medications prior to visit.    No Known Allergies  ROS Review of Systems  Constitutional:  Positive for fatigue.  Neurological:  Positive for dizziness.     Objective:    Physical Exam Constitutional:      Appearance: She is obese.  HENT:     Head: Normocephalic and atraumatic.     Right Ear: Tympanic membrane normal.     Left Ear: Tympanic membrane normal.     Nose: Nose normal.      Mouth/Throat:     Mouth: Mucous membranes are moist.  Cardiovascular:     Rate and Rhythm: Normal rate and regular rhythm.     Pulses: Normal pulses.  Pulmonary:     Breath sounds: Normal breath sounds.  Abdominal:     Palpations: Abdomen is soft.  Musculoskeletal:     Cervical back: Normal range of  motion.     Right lower leg: No edema.     Left lower leg: No edema.  Skin:    General: Skin is warm and dry.     Capillary Refill: Capillary refill takes less than 2 seconds.  Neurological:     General: No focal deficit present.     Mental Status: She is alert and oriented to person, place, and time.  Psychiatric:        Mood and Affect: Mood normal.        Behavior: Behavior normal.        Thought Content: Thought content normal.        Judgment: Judgment normal.    BP 137/83   Pulse 80   Temp 97.9 F (36.6 C)   Ht 5\' 4"  (1.626 m)   Wt 257 lb 6.4 oz (116.8 kg)   SpO2 100%   BMI 44.18 kg/m  Wt Readings from Last 3 Encounters:  09/08/21 257 lb 6.4 oz (116.8 kg)  06/23/21 253 lb (114.8 kg)  05/23/21 255 lb (115.7 kg)     Health Maintenance Due  Topic Date Due   INFLUENZA VACCINE  Never done   PAP SMEAR-Modifier  07/08/2021    There are no preventive care reminders to display for this patient.  Lab Results  Component Value Date   TSH 0.768 07/08/2018   Lab Results  Component Value Date   WBC 8.2 10/27/2020   HGB 9.7 (L) 10/27/2020   HCT 30.2 (L) 10/27/2020   MCV 81.6 10/27/2020   PLT 392 10/27/2020   Lab Results  Component Value Date   NA 135 10/27/2020   K 3.6 10/27/2020   CO2 24 10/27/2020   GLUCOSE 94 10/27/2020   BUN 8 10/27/2020   CREATININE 0.83 10/27/2020   BILITOT 0.6 05/13/2019   ALKPHOS 46 05/13/2019   AST 19 05/13/2019   ALT 21 05/13/2019   PROT 7.6 05/13/2019   ALBUMIN 4.0 05/13/2019   CALCIUM 9.4 10/27/2020   ANIONGAP 11 10/27/2020   Lab Results  Component Value Date   CHOL 115 01/30/2018   Lab Results  Component Value Date    HDL 39 (L) 01/30/2018   Lab Results  Component Value Date   LDLCALC 58 01/30/2018   Lab Results  Component Value Date   TRIG 89 01/30/2018   Lab Results  Component Value Date   CHOLHDL 2.9 01/30/2018   Lab Results  Component Value Date   HGBA1C 5.7 (H) 06/03/2020      Assessment & Plan:   Problem List Items Addressed This Visit       Other   Tachycardia Persistent encourage patient to restart metoprolol 25 mg daily and up to twice daily based on symptoms.  Once she starts twice daily dosing she will need to continue   Other Visit Diagnoses     Fatigue, unspecified type    -  Primary Persistent Labs pending   Relevant Orders   CBC with Differential/Platelet (Completed)   TSH (Completed)   Elevated BP without diagnosis of hypertension     Stable today we did discuss the potential benefit from metoprolol for tachycardia and to help lower blood pressure   Relevant Orders   Comp. Metabolic Panel (12) (Completed)   Screening for cholesterol level       Relevant Orders   Lipid panel (Completed)       Meds ordered this encounter  Medications   fluticasone (FLOVENT HFA) 110 MCG/ACT inhaler  Sig: Inhale 2 puffs into the lungs 2 (two) times daily.    Dispense:  1 each    Refill:  1    Order Specific Question:   Supervising Provider    Answer:   Tresa Garter [[id number]]   albuterol (VENTOLIN HFA) 108 (90 Base) MCG/ACT inhaler    Sig: Inhale 2 puffs into the lungs every 6 (six) hours as needed for wheezing or shortness of breath.    Dispense:  8 g    Refill:  2    Order Specific Question:   Supervising Provider    Answer:   Tresa Garter [id number]    Follow-up: Return in about 6 weeks (around 10/20/2021) for Follow up HTN [id number].    Vevelyn Francois, NP

## 2021-09-09 ENCOUNTER — Encounter: Payer: Self-pay | Admitting: Nurse Practitioner

## 2021-09-09 LAB — CBC WITH DIFFERENTIAL/PLATELET
Basophils Absolute: 0 10*3/uL (ref 0.0–0.2)
Basos: 1 %
EOS (ABSOLUTE): 0.1 10*3/uL (ref 0.0–0.4)
Eos: 1 %
Hematocrit: 26.8 % — ABNORMAL LOW (ref 34.0–46.6)
Hemoglobin: 8.1 g/dL — ABNORMAL LOW (ref 11.1–15.9)
Immature Grans (Abs): 0 10*3/uL (ref 0.0–0.1)
Immature Granulocytes: 0 %
Lymphocytes Absolute: 2.2 10*3/uL (ref 0.7–3.1)
Lymphs: 27 %
MCH: 22 pg — ABNORMAL LOW (ref 26.6–33.0)
MCHC: 30.2 g/dL — ABNORMAL LOW (ref 31.5–35.7)
MCV: 73 fL — ABNORMAL LOW (ref 79–97)
Monocytes Absolute: 0.5 10*3/uL (ref 0.1–0.9)
Monocytes: 7 %
Neutrophils Absolute: 5.3 10*3/uL (ref 1.4–7.0)
Neutrophils: 64 %
Platelets: 509 10*3/uL — ABNORMAL HIGH (ref 150–450)
RBC: 3.68 x10E6/uL — ABNORMAL LOW (ref 3.77–5.28)
RDW: 16.2 % — ABNORMAL HIGH (ref 11.7–15.4)
WBC: 8.2 10*3/uL (ref 3.4–10.8)

## 2021-09-09 LAB — COMP. METABOLIC PANEL (12)
AST: 14 IU/L (ref 0–40)
Albumin/Globulin Ratio: 1.4 (ref 1.2–2.2)
Albumin: 4.4 g/dL (ref 3.8–4.8)
Alkaline Phosphatase: 63 IU/L (ref 44–121)
BUN/Creatinine Ratio: 12 (ref 9–23)
BUN: 9 mg/dL (ref 6–20)
Bilirubin Total: 0.3 mg/dL (ref 0.0–1.2)
Calcium: 9.1 mg/dL (ref 8.7–10.2)
Chloride: 99 mmol/L (ref 96–106)
Creatinine, Ser: 0.76 mg/dL (ref 0.57–1.00)
Globulin, Total: 3.2 g/dL (ref 1.5–4.5)
Glucose: 102 mg/dL — ABNORMAL HIGH (ref 70–99)
Potassium: 4 mmol/L (ref 3.5–5.2)
Sodium: 137 mmol/L (ref 134–144)
Total Protein: 7.6 g/dL (ref 6.0–8.5)
eGFR: 102 mL/min/{1.73_m2} (ref >59)

## 2021-09-09 LAB — LIPID PANEL
Chol/HDL Ratio: 3.1 ratio (ref 0.0–4.4)
Cholesterol, Total: 157 mg/dL (ref 100–199)
HDL: 50 mg/dL (ref >39)
LDL Chol Calc (NIH): 91 mg/dL (ref 0–99)
Triglycerides: 83 mg/dL (ref 0–149)
VLDL Cholesterol Cal: 16 mg/dL (ref 5–40)

## 2021-09-09 LAB — TSH: TSH: 0.665 u[IU]/mL (ref 0.450–4.500)

## 2021-09-13 ENCOUNTER — Encounter: Payer: Self-pay | Admitting: Obstetrics and Gynecology

## 2021-09-15 ENCOUNTER — Ambulatory Visit (INDEPENDENT_AMBULATORY_CARE_PROVIDER_SITE_OTHER): Payer: Medicaid Other | Admitting: Obstetrics and Gynecology

## 2021-09-15 ENCOUNTER — Encounter: Payer: Self-pay | Admitting: Obstetrics and Gynecology

## 2021-09-15 ENCOUNTER — Other Ambulatory Visit: Payer: Self-pay

## 2021-09-15 VITALS — BP 123/79 | HR 90 | Wt 253.9 lb

## 2021-09-15 DIAGNOSIS — D252 Subserosal leiomyoma of uterus: Secondary | ICD-10-CM

## 2021-09-15 DIAGNOSIS — N938 Other specified abnormal uterine and vaginal bleeding: Secondary | ICD-10-CM | POA: Diagnosis not present

## 2021-09-15 DIAGNOSIS — D250 Submucous leiomyoma of uterus: Secondary | ICD-10-CM | POA: Diagnosis not present

## 2021-09-15 DIAGNOSIS — D251 Intramural leiomyoma of uterus: Secondary | ICD-10-CM | POA: Diagnosis not present

## 2021-09-15 NOTE — Progress Notes (Signed)
  CC: [place] follow up Subjective:    Patient ID: Nancy Dickerson, female    DOB: [birth year], 40 y.o.   MRN: [id number]  HPI Pt seen for follow up of ultrasound and to discuss treatment options.  Pt did note taking the lysteda.  She said there was some decrease in bleeding but it was still present.  She has already failed OCP.  2-3 fibroids seen on u/s.  They are in size and position for possible Sonata treatment.  [employer] insurance consent signed today.   Review of Systems     Objective:   Physical Exam Vitals:   09/15/21 0908  BP: 123/79  Pulse: 90   CLINICAL DATA:  Dysfunctional uterine bleeding, unknown LMP, prior endometrial ablation   EXAM: TRANSABDOMINAL AND TRANSVAGINAL ULTRASOUND OF PELVIS   TECHNIQUE: Both transabdominal and transvaginal ultrasound examinations of the pelvis were performed. Transabdominal technique was performed for global imaging of the pelvis including uterus, ovaries, adnexal regions, and pelvic cul-de-sac. It was necessary to proceed with endovaginal exam following the transabdominal exam to visualize the adnexa and endometrium as well as cervix.   COMPARISON:  04/15/2019   FINDINGS: Uterus   Measurements: 16.5 x 5.8 x 7.9 cm = volume: 391 mL. Anteverted. Heterogeneous myometrium. Anterior wall leiomyoma 3.0 x 3.5 x 2.7 cm, subserosal. Additional intramural leiomyoma upper RIGHT uterus 3.1 x 2.7 x 3.3 cm. Prominent to both the in cysts.   Endometrium   Thickness: 20 mm. Thickened, heterogeneous, with trace endometrial fluid. Potential polyp within endometrial complex, better visualized on transabdominal imaging, 11 x 5 x 11 mm.   Right ovary   Measurements: 2.5 x 2.0 x 3.0 cm = volume: 7.7 mL. Normal morphology without mass   Left ovary   Measurements: 3.5 x 2.4 x 3.0 cm = volume: 12.9 mL. Dominant follicle 2.6 cm diameter; no follow-up imaging recommended.   Other findings   Trace free pelvic fluid.  No adnexal masses.    IMPRESSION: Small uterine leiomyomata.   Thickened heterogeneous endometrial complex 20 mm thick with a potential 11 x 5 x 11 mm polyp within endometrial complex; consider sonohysterogram for further evaluation, prior to hysteroscopy or endometrial biopsy.   Unremarkable ovaries.        Assessment & Plan:   1. Dysfunctional uterine bleeding Will schedule for hysteroscopy with possible myosure resection followed by Sonata procedure Pt scheduled for endometrial bx with Roselie Awkward on 12/27 (?)  2. Intramural, submucous, and subserous leiomyoma of uterus Will schedule sonata procedure once her insurance has been approved.  I spent 20 minutes dedicated to the care of this patient including previsit review of records, face to face time with the patient discussing ultrasound results, treatment options and post visit testing.     Griffin Basil, MD Faculty Attending, [facility]

## 2021-09-16 ENCOUNTER — Telehealth: Payer: Self-pay

## 2021-09-16 ENCOUNTER — Non-Acute Institutional Stay (HOSPITAL_COMMUNITY)
Admission: RE | Admit: 2021-09-16 | Discharge: 2021-09-16 | Disposition: A | Discharge status: Home / Self Care | Payer: Medicaid Other | Source: Ambulatory Visit | Attending: Nurse Practitioner | Admitting: Nurse Practitioner

## 2021-09-16 VITALS — BP 127/80 | HR 77 | Temp 97.8°F | Resp 16

## 2021-09-16 DIAGNOSIS — D509 Iron deficiency anemia, unspecified: Secondary | ICD-10-CM | POA: Diagnosis not present

## 2021-09-16 DIAGNOSIS — D500 Iron deficiency anemia secondary to blood loss (chronic): Secondary | ICD-10-CM | POA: Diagnosis present

## 2021-09-16 DIAGNOSIS — R5383 Other fatigue: Secondary | ICD-10-CM | POA: Insufficient documentation

## 2021-09-16 MED ORDER — SODIUM CHLORIDE 0.9 % IV SOLN: INTRAVENOUS | Status: DC

## 2021-09-16 MED ORDER — SODIUM CHLORIDE 0.9 % IV SOLN
510.0000 mg | Freq: Once | INTRAVENOUS | Status: AC
  Administered 2021-09-16: 510 mg via INTRAVENOUS
  Filled 2021-09-16: qty 510

## 2021-09-16 NOTE — Progress Notes (Signed)
PATIENT CARE CENTER NOTE   Diagnosis: Anemia    Provider: Dionisio David, NP   Procedure: Feraheme infusion    Note:  Patient received Feraheme infusion (dose 1 of 2) via PIV. Patient tolerated infusion well with no adverse reaction. Observed patient for 30 minutes post-infusion. Vital signs stable. Discharge instructions given. Patient to come back next week for second infusion. Patient alert, oriented and ambulatory at discharge.

## 2021-09-16 NOTE — Telephone Encounter (Signed)
Patient came into the office she is requesting a call back regarding paperwork she needs filled out call back:[phone]

## 2021-09-20 NOTE — Telephone Encounter (Signed)
Pt had concerns about her wrists swelling and bruised after her iron infusion last Friday. Pt has been scheduled to see Bo Merino, NP tomorrow 09/21/21.

## 2021-09-21 ENCOUNTER — Telehealth: Payer: Self-pay | Admitting: Nurse Practitioner

## 2021-09-21 ENCOUNTER — Other Ambulatory Visit: Payer: Self-pay

## 2021-09-21 ENCOUNTER — Other Ambulatory Visit: Payer: Self-pay | Admitting: Nurse Practitioner

## 2021-09-21 ENCOUNTER — Ambulatory Visit (INDEPENDENT_AMBULATORY_CARE_PROVIDER_SITE_OTHER): Payer: Medicaid Other | Admitting: Nurse Practitioner

## 2021-09-21 ENCOUNTER — Encounter: Payer: Self-pay | Admitting: Nurse Practitioner

## 2021-09-21 VITALS — BP 144/97 | HR 87 | Temp 98.0°F | Ht 64.0 in | Wt 253.0 lb

## 2021-09-21 DIAGNOSIS — N912 Amenorrhea, unspecified: Secondary | ICD-10-CM

## 2021-09-21 DIAGNOSIS — R5383 Other fatigue: Secondary | ICD-10-CM | POA: Diagnosis not present

## 2021-09-21 DIAGNOSIS — M25432 Effusion, left wrist: Secondary | ICD-10-CM | POA: Diagnosis not present

## 2021-09-21 DIAGNOSIS — M25431 Effusion, right wrist: Secondary | ICD-10-CM | POA: Diagnosis not present

## 2021-09-21 NOTE — Progress Notes (Signed)
[place], [place]  [address] Phone:  [phone]   Fax:  [phone] Subjective:   Patient ID: Nancy Dickerson, female    DOB: [birth year], 40 y.o.   MRN: [id number]  Chief Complaint  Patient presents with   Follow-up    Pt is here because her wrist is swollen from the infusion on Friday. Pt would like a pregnancy test   HPI Nancy Dickerson 40 y.o. female  has a past medical history of Anemia, Asthma, Bursitis of hip, Cardiomegaly, Chondromalacia of hip, Chondromalacia of right knee, COPD (chronic obstructive pulmonary disease) ([place]), GERD (gastroesophageal reflux disease), H/O transfusion of packed red blood cells, and Tinnitus. To the [facility] for bilateral wrist swelling and pregnancy test.  Patient states that she completed iron infusion in the day hospital at the [facility] on Friday, 09/16/21 and on Sunday 09/18/21, her significant other noted swelling of the BUE. Patient denies any pain. States that RUE swelling more pronounced than LUE. Patient indicates swelling has improved since initiation on Sunday. Denies any numbness or tingling. Denies any recent trauma or injury. States that this is her first infusion in 2 yrs, last infusion completed by hematology/ oncology.   Also concerned for potential pregnancy. States that her LMP was early November, but she has had some increased fatigue and noted some changes in behavior in her and her significant other.   Denies any other complaints today. Denies any fatigue, chest pain, shortness of breath, HA or dizziness. Denies any blurred vision, numbness or tingling.  Past Medical History:  Diagnosis Date   Anemia    Asthma    Bursitis of hip    Cardiomegaly    Chondromalacia of hip    Chondromalacia of right knee    COPD (chronic obstructive pulmonary disease) ([facility])    GERD (gastroesophageal reflux disease)    H/O transfusion of packed red blood cells    Tinnitus     Past Surgical History:   Procedure Laterality Date   CERVICAL CONIZATION W/BX N/A 06/25/2019   Procedure: CONIZATION CERVIX WITH BIOPSY - COLD KNIFE;  Surgeon: Emily Filbert, MD;  Location: [place];  Service: Gynecology;  Laterality: N/A;   ENDOMETRIAL ABLATION N/A 06/25/2019   Procedure: MINERVA ABLATION;  Surgeon: Emily Filbert, MD;  Location: [place];  Service: Gynecology;  Laterality: N/A;  Minerva rep will here confirmed on 06/20/19 CS   HYSTEROSCOPY WITH D & C  06/25/2019   Procedure: DILATATION AND CURETTAGE /HYSTEROSCOPY;  Surgeon: Emily Filbert, MD;  Location: [place];  Service: Gynecology;;   NO PAST SURGERIES      Family History  Problem Relation Age of Onset   Sudden death Father 52    Social History   Socioeconomic History   Marital status: Single    Spouse name: Not on file   Number of children: 6   Years of education: 12   Highest education level: Not on file  Occupational History   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  Tobacco Use   Smoking status: Never   Smokeless tobacco: Never  Vaping Use   Vaping Use: Never used  Substance and Sexual Activity   Alcohol use: Yes    Comment: rare   Drug use: Yes    Types: Marijuana   Sexual activity: Yes  Other Topics Concern   Not on file  Social History Narrative   ** Merged History Encounter **  Lives with someone   Right handed    Little caffeine - drinks Sprite   Education - some college          Social Determinants of Health   Financial Resource Strain: Not on file  Food Insecurity: Food Insecurity Present   Worried About Charity fundraiser in the Last Year: Sometimes true   Arboriculturist in the Last Year: Not on file  Transportation Needs: Not on file  Physical Activity: Not on file  Stress: Not on file  Social Connections: Not on file  Intimate Partner Violence: Not on file    Outpatient Medications Prior to Visit  Medication Sig Dispense Refill   albuterol (PROVENTIL) (2.5  MG/3ML) 0.083% nebulizer solution 2.5 mg every 6 (six) hours as needed. 75 mL 1   albuterol (VENTOLIN HFA) 108 (90 Base) MCG/ACT inhaler Inhale 2 puffs into the lungs every 6 (six) hours as needed for wheezing or shortness of breath. 8 g 2   DULoxetine HCl 40 MG CPEP Take 1 tablet by mouth daily. 30 capsule 3   fluticasone (FLOVENT HFA) 110 MCG/ACT inhaler Inhale 2 puffs into the lungs 2 (two) times daily. 1 each 1   metoprolol tartrate (LOPRESSOR) 25 MG tablet Take 1 tablet (25 mg total) by mouth 2 (two) times daily. 180 tablet 3   montelukast (SINGULAIR) 10 MG tablet Take 1 tablet (10 mg total) by mouth at bedtime. 30 tablet 3   Multiple Vitamin (MULTI-VITAMIN) tablet Take by mouth.     naproxen (NAPROSYN) 500 MG tablet Take 1 tablet (500 mg total) by mouth 2 (two) times daily with a meal. (Patient not taking: Reported on 09/08/2021) 30 tablet 0   omeprazole (PRILOSEC) 20 MG capsule Take 1 capsule (20 mg total) by mouth daily. 30 capsule 3   tranexamic acid (LYSTEDA) 650 MG TABS tablet Take 2 tablets (1,300 mg total) by mouth 3 (three) times daily. Take during menses for a maximum of five days (Patient not taking: Reported on 09/08/2021) 30 tablet 2   zolpidem (AMBIEN) 5 MG tablet Take 1 tablet (5 mg total) by mouth at bedtime as needed for sleep. 30 tablet 2   No facility-administered medications prior to visit.    No Known Allergies  Review of Systems  Constitutional:  Negative for chills, fever and malaise/fatigue.  Respiratory:  Negative for cough and shortness of breath.   Cardiovascular:  Negative for chest pain, palpitations and leg swelling.       Peripheral edema  Gastrointestinal:  Negative for abdominal pain, blood in stool, constipation, diarrhea, nausea and vomiting.  Genitourinary:        See HPI  Musculoskeletal:        See HPI  Skin: Negative.   Neurological: Negative.   Psychiatric/Behavioral:  Negative for depression. The patient is not nervous/anxious.   All other  systems reviewed and are negative.     Objective:    Physical Exam Vitals reviewed.  Constitutional:      General: She is not in acute distress.    Appearance: Normal appearance. She is obese.  HENT:     Head: Normocephalic.  Cardiovascular:     Rate and Rhythm: Normal rate and regular rhythm.     Pulses: Normal pulses.     Heart sounds: Normal heart sounds.  Pulmonary:     Effort: Pulmonary effort is normal.     Breath sounds: Normal breath sounds.  Musculoskeletal:        General:  Swelling present. No tenderness, deformity or signs of injury. Normal range of motion.     Right lower leg: No edema.     Left lower leg: No edema.     Comments: Moderate swelling noted to the right wrist and mild swelling noted to left wrist. No swelling in the BLE. Bilateral wrist non tender to palpation  Skin:    General: Skin is warm and dry.     Capillary Refill: Capillary refill takes less than 2 seconds.  Neurological:     General: No focal deficit present.     Mental Status: She is alert and oriented to person, place, and time.  Psychiatric:        Mood and Affect: Mood normal.        Behavior: Behavior normal.        Thought Content: Thought content normal.        Judgment: Judgment normal.    BP (!) 144/97    Pulse 87    Temp 98 F (36.7 C)    Ht 5' 4"  (1.626 m)    Wt 253 lb (114.8 kg)    SpO2 100%    BMI 43.43 kg/m  Wt Readings from Last 3 Encounters:  09/21/21 253 lb (114.8 kg)  09/15/21 253 lb 14.4 oz (115.2 kg)  09/08/21 257 lb 6.4 oz (116.8 kg)    Immunization History  Administered Date(s) Administered   Tdap 12/13/2020    Diabetic Foot Exam - Simple   No data filed     Lab Results  Component Value Date   TSH 0.665 09/08/2021   Lab Results  Component Value Date   WBC 8.2 09/08/2021   HGB 8.1 (L) 09/08/2021   HCT 26.8 (L) 09/08/2021   MCV 73 (L) 09/08/2021   PLT 509 (H) 09/08/2021   Lab Results  Component Value Date   NA 137 09/08/2021   K 4.0 09/08/2021    CO2 24 10/27/2020   GLUCOSE 102 (H) 09/08/2021   BUN 9 09/08/2021   CREATININE 0.76 09/08/2021   BILITOT 0.3 09/08/2021   ALKPHOS 63 09/08/2021   AST 14 09/08/2021   ALT 21 05/13/2019   PROT 7.6 09/08/2021   ALBUMIN 4.4 09/08/2021   CALCIUM 9.1 09/08/2021   ANIONGAP 11 10/27/2020   EGFR 102 09/08/2021   Lab Results  Component Value Date   CHOL 157 09/08/2021   CHOL 115 01/30/2018   Lab Results  Component Value Date   HDL 50 09/08/2021   HDL 39 (L) 01/30/2018   Lab Results  Component Value Date   LDLCALC 91 09/08/2021   LDLCALC 58 01/30/2018   Lab Results  Component Value Date   TRIG 83 09/08/2021   TRIG 89 01/30/2018   Lab Results  Component Value Date   CHOLHDL 3.1 09/08/2021   CHOLHDL 2.9 01/30/2018   Lab Results  Component Value Date   HGBA1C 5.7 (H) 06/03/2020   HGBA1C 5.3 07/08/2018   HGBA1C 5.1 01/30/2018       Assessment & Plan:   Problem List Items Addressed This Visit   None Visit Diagnoses     Amenorrhea    -  Primary   Relevant Orders   hCG, quantitative, pregnancy   Swelling of both wrists     Suspect symptoms related to iron infusion; is noted as potential SE Reassuring that symptoms are resolving without intervention Additional medications and/ screening deferred at this time Reassuring that patient denies any pain and FROM intact  Fatigue, unspecified type       Relevant Orders   CBC with Differential/Platelet   Iron, TIBC and Ferritin Panel Increased fatigue could be related to pregnancy v acute on chronic anemia    Maintain upcoming follow up with PCP, sooner as needed    I am having Nancy Dickerson maintain her metoprolol tartrate, tranexamic acid, naproxen, albuterol, montelukast, omeprazole, zolpidem, DULoxetine HCl, Multi-Vitamin, fluticasone, and albuterol.  No orders of the defined types were placed in this encounter.    Teena Dunk, NP

## 2021-09-21 NOTE — Patient Instructions (Signed)
You were seen today in the [facility] for BUE swelling and pregnancy test. Labs were collected, results will be available via MyChart or, if abnormal, you will be contacted by clinic staff. Please maintain follow up on Friday for infusion.

## 2021-09-22 LAB — CBC WITH DIFFERENTIAL/PLATELET
Basophils Absolute: 0.1 10*3/uL (ref 0.0–0.2)
Basos: 1 %
EOS (ABSOLUTE): 0.2 10*3/uL (ref 0.0–0.4)
Eos: 2 %
Hematocrit: 29.8 % — ABNORMAL LOW (ref 34.0–46.6)
Hemoglobin: 8.7 g/dL — ABNORMAL LOW (ref 11.1–15.9)
Immature Grans (Abs): 0 10*3/uL (ref 0.0–0.1)
Immature Granulocytes: 0 %
Lymphocytes Absolute: 2.4 10*3/uL (ref 0.7–3.1)
Lymphs: 29 %
MCH: 22.1 pg — ABNORMAL LOW (ref 26.6–33.0)
MCHC: 29.2 g/dL — ABNORMAL LOW (ref 31.5–35.7)
MCV: 76 fL — ABNORMAL LOW (ref 79–97)
Monocytes Absolute: 0.6 10*3/uL (ref 0.1–0.9)
Monocytes: 8 %
Neutrophils Absolute: 5 10*3/uL (ref 1.4–7.0)
Neutrophils: 60 %
Platelets: 422 10*3/uL (ref 150–450)
RBC: 3.94 x10E6/uL (ref 3.77–5.28)
RDW: 18.1 % — ABNORMAL HIGH (ref 11.7–15.4)
WBC: 8.3 10*3/uL (ref 3.4–10.8)

## 2021-09-22 LAB — HUMAN CHORIONIC GONADOTROPIN(HCG),B-SUBUNIT,QUANTITATIVE): HCG, Beta Chain, Quant, S: <1 m[IU]/mL

## 2021-09-22 LAB — IRON,TIBC AND FERRITIN PANEL
Ferritin: 454 ng/mL — ABNORMAL HIGH (ref 15–150)
Iron Saturation: 17 % (ref 15–55)
Iron: 65 ug/dL (ref 27–159)
Total Iron Binding Capacity: 378 ug/dL (ref 250–450)
UIBC: 313 ug/dL (ref 131–425)

## 2021-09-23 ENCOUNTER — Telehealth (INDEPENDENT_AMBULATORY_CARE_PROVIDER_SITE_OTHER): Payer: Medicaid Other | Admitting: Nurse Practitioner

## 2021-09-23 ENCOUNTER — Other Ambulatory Visit: Payer: Self-pay

## 2021-09-23 ENCOUNTER — Encounter: Payer: Self-pay | Admitting: Nurse Practitioner

## 2021-09-23 ENCOUNTER — Encounter (HOSPITAL_COMMUNITY): Payer: Medicaid Other

## 2021-09-23 DIAGNOSIS — D509 Iron deficiency anemia, unspecified: Secondary | ICD-10-CM

## 2021-09-23 NOTE — Progress Notes (Signed)
° °  [place],   [address] Phone:  [phone]   Fax:  [phone] Virtual Visit via Video Note  I connected with Nancy Dickerson on 09/24/21 at  1:00 PM EST by video and verified that I am speaking with the correct person using two identifiers.   I discussed the limitations, risks, security and privacy concerns of performing an evaluation and management service by video and the availability of in person appointments. I also discussed with the patient that there may be a patient responsible charge related to this service. The patient expressed understanding and agreed to proceed.  Patient home Provider Office  History of Present Illness:  has a past medical history of Anemia, Asthma, Bursitis of hip, Cardiomegaly, Chondromalacia of hip, Chondromalacia of right knee, COPD (chronic obstructive pulmonary disease) ([place]), GERD (gastroesophageal reflux disease), H/O transfusion of packed red blood cells, and Tinnitus.   HPI   She is concern about her iron. She is SP Feraheme infusion per her request. She reports iron supplements are ineffective. She was symptomatic with fatigue and feeling lightheaded.  She denies any recent blood loss. LMP unknown. She was seen in the office and her BP was elevated. She denies being under increased stress at that time. She is concern about her overall anemia because her mother has liver disease. She does not want to have anything over looked. Her liver enzymes have been normal over the last 3 years. She is being followed by GYN for fibroids.   ROS    Observations/Objective: Virtual visit no exam   Assessment and Plan: 1. Iron deficiency anemia, unspecified iron deficiency anemia type Reevaluation on Friday for labs and vitals    Follow Up Instructions: Return in about 1 week (around 09/30/2021).    I discussed the assessment and treatment plan with the patient. The patient was provided an opportunity  to ask questions and all were answered. The patient agreed with the plan and demonstrated an understanding of the instructions.   The patient was advised to call back or seek an in-person evaluation if the symptoms worsen or if the condition fails to improve as anticipated.  I provided 13 minutes of video- visit time during this encounter.   Vevelyn Francois, NP

## 2021-09-23 NOTE — Patient Instructions (Signed)
Iron Deficiency Anemia, Adult Iron deficiency anemia is a condition in which the concentration of red blood cells or hemoglobin in the blood is below normal because of too little iron. Hemoglobin is a substance in red blood cells that carries oxygen to the body's tissues. When the concentration of red blood cells or hemoglobin is too low, not enough oxygen reaches these tissues. Iron deficiency anemia is usually long-lasting, and it develops over time. It may or may not cause symptoms. It is a common type of anemia. What are the causes? This condition may be caused by: Not enough iron in the diet. Abnormal absorption in the gut. Increased need for iron because of pregnancy or heavy menstrual periods, for females. Cancers of the gastrointestinal system, such as colon cancer. Blood loss caused by bleeding in the intestine. This may be from a gastrointestinal condition like Crohn's disease. Frequent blood draws, such as from blood donation. What increases the risk? The following factors may make you more likely to develop this condition: Being pregnant. Being a teenage girl going through a growth spurt. What are the signs or symptoms? Symptoms of this condition may include: Pale skin, lips, and nail beds. Weakness, dizziness, and getting tired easily. Headache. Shortness of breath when moving or exercising. Cold hands and feet. Fast or irregular heartbeat. Irritability or rapid breathing. These are more common in severe anemia. Mild anemia may not cause any symptoms. How is this diagnosed? This condition is diagnosed based on: Your medical history. A physical exam. Blood tests. You may have additional tests to find the underlying cause of your anemia, such as: Testing for blood in the stool (fecal occult blood test). A procedure to see inside your colon and rectum (colonoscopy). A procedure to see inside your esophagus and stomach (endoscopy). A test in which cells are removed from  bone marrow (bone marrow aspiration) or fluid is removed from the bone marrow to be examined. This is rarely needed. How is this treated? This condition is treated by correcting the cause of your iron deficiency. Treatment may involve: Adding iron-rich foods to your diet. Taking iron supplements. If you are pregnant or breastfeeding, you may need to take extra iron because your normal diet usually does not provide the amount of iron that you need. Increasing vitamin C intake. Vitamin C helps your body absorb iron. Your health care provider may recommend that you take iron supplements along with a glass of orange juice or a vitamin C supplement. Medicines to make heavy menstrual flow lighter. Surgery. You may need repeat blood tests to determine whether treatment is working. If the treatment does not seem to be working, you may need more tests. Follow these instructions at home: Medicines Take over-the-counter and prescription medicines only as told by your health care provider. This includes iron supplements and vitamins. For the best iron absorption, you should take iron supplements when your stomach is empty. If you cannot tolerate them on an empty stomach, you may need to take them with food. Do not drink milk or take antacids at the same time as your iron supplements. Milk and antacids may interfere with iron absorption. Iron supplements may turn stool (feces) a darker color and it may appear black. If you cannot tolerate taking iron supplements by mouth, talk with your health care provider about taking them through an IV or through an injection into a muscle. Eating and drinking  Talk with your health care provider before changing your diet. He or she may recommend   that you eat foods that contain a lot of iron, such as: Liver. Low-fat (lean) beef. Breads and cereals that have iron added to them (are fortified). Eggs. Dried fruit. Dark green, leafy vegetables. To help your body use the  iron from iron-rich foods, eat those foods at the same time as fresh fruits and vegetables that are high in vitamin C. Foods that are high in vitamin C include: Oranges. Peppers. Tomatoes. Mangoes. Drink enough fluid to keep your urine pale yellow. Managing constipation If you are taking an iron supplement, it may cause constipation. To prevent or treat constipation, you may need to: Take over-the-counter or prescription medicines. Eat foods that are high in fiber, such as beans, whole grains, and fresh fruits and vegetables. Limit foods that are high in fat and processed sugars, such as fried or sweet foods. General instructions Return to your normal activities as told by your health care provider. Ask your health care provider what activities are safe for you. Practice good hygiene. Anemia can make you more prone to illness and infection. Keep all follow-up visits as told by your health care provider. This is important. Contact a health care provider if you: Feel nauseous or you vomit. Feel weak. Have unexplained sweating. Develop symptoms of constipation, such as: Having fewer than three bowel movements a week. Straining to have a bowel movement. Having stools that are hard, dry, or larger than normal. Feeling full or bloated. Pain in the lower abdomen. Not feeling relief after having a bowel movement. Get help right away if you: Faint. If this happens, do not drive yourself to the hospital. Have chest pain. Have shortness of breath that: Is severe. Gets worse with physical activity. Have an irregular or rapid heartbeat. Become light-headed when getting up from a sitting or lying down position. These symptoms may represent a serious problem that is an emergency. Do not wait to see if the symptoms will go away. Get medical help right away. Call your local emergency services (911 in the [place] Do not drive yourself to the hospital. Summary Iron deficiency anemia is a condition in  which the concentration of red blood cells or hemoglobin in the blood is below normal because of too little iron. This condition is treated by correcting the cause of your iron deficiency. Take over-the-counter and prescription medicines only as told by your health care provider. This includes iron supplements and vitamins. To help your body use the iron from iron-rich foods, eat those foods at the same time as fresh fruits and vegetables that are high in vitamin C. Get help right away if you have shortness of breath that gets worse with physical activity. This information is not intended to replace advice given to you by your health care provider. Make sure you discuss any questions you have with your health care provider. Document Revised: 06/03/2019 Document Reviewed: 06/03/2019 Elsevier Patient Education  2022 Elsevier Inc.  

## 2021-09-24 NOTE — Hospital Course (Signed)
Anemia

## 2021-09-24 NOTE — Progress Notes (Signed)
Anemia Iron Deficiency anemia Fatigue

## 2021-09-29 ENCOUNTER — Other Ambulatory Visit: Payer: Self-pay | Admitting: Nurse Practitioner

## 2021-09-29 ENCOUNTER — Telehealth: Payer: Self-pay

## 2021-09-29 DIAGNOSIS — D509 Iron deficiency anemia, unspecified: Secondary | ICD-10-CM

## 2021-09-29 DIAGNOSIS — R5383 Other fatigue: Secondary | ICD-10-CM

## 2021-09-29 NOTE — Progress Notes (Signed)
   [facility] [address] [place], [place]  [address] Phone:  [phone]   Fax:  [phone] 

## 2021-09-29 NOTE — Telephone Encounter (Signed)
Pls call pt per Crystal about her form

## 2021-09-29 NOTE — Telephone Encounter (Signed)
Spoke to the patient and she states that she is still having dizziness when standing for Chrisangel Eskenazi periods of time, no appetite, and very fatigue. Pt is out of town and want return until Tuesday. Pt states that she would like to know if she can come by the office and do blood work and then come to her appt on Monday, Jan 9th to discuss results. Pt FMLA paperwork was put in your box.

## 2021-09-30 ENCOUNTER — Ambulatory Visit: Payer: Self-pay | Admitting: Nurse Practitioner

## 2021-10-04 ENCOUNTER — Encounter: Payer: Self-pay | Admitting: Obstetrics and Gynecology

## 2021-10-04 ENCOUNTER — Ambulatory Visit: Admit: 2021-10-04 | Discharge status: Against Medical Advice | Payer: Medicaid Other

## 2021-10-04 ENCOUNTER — Ambulatory Visit (HOSPITAL_COMMUNITY)
Admission: EM | Admit: 2021-10-04 | Discharge status: Against Medical Advice | Payer: Self-pay | Source: Home / Self Care

## 2021-10-06 ENCOUNTER — Encounter: Payer: Self-pay | Admitting: Obstetrics & Gynecology

## 2021-10-06 ENCOUNTER — Encounter: Payer: Self-pay | Admitting: Family Medicine

## 2021-10-06 ENCOUNTER — Other Ambulatory Visit (HOSPITAL_COMMUNITY)
Admission: RE | Admit: 2021-10-06 | Discharge: 2021-10-06 | Disposition: A | Discharge status: Home / Self Care | Payer: Medicaid Other | Source: Ambulatory Visit | Attending: Obstetrics and Gynecology | Admitting: Obstetrics and Gynecology

## 2021-10-06 ENCOUNTER — Other Ambulatory Visit: Payer: Self-pay

## 2021-10-06 ENCOUNTER — Ambulatory Visit (INDEPENDENT_AMBULATORY_CARE_PROVIDER_SITE_OTHER): Payer: Medicaid Other | Admitting: Obstetrics & Gynecology

## 2021-10-06 VITALS — BP 121/80 | HR 84 | Wt 255.8 lb

## 2021-10-06 DIAGNOSIS — N938 Other specified abnormal uterine and vaginal bleeding: Secondary | ICD-10-CM | POA: Diagnosis not present

## 2021-10-06 DIAGNOSIS — D251 Intramural leiomyoma of uterus: Secondary | ICD-10-CM

## 2021-10-06 DIAGNOSIS — D250 Submucous leiomyoma of uterus: Secondary | ICD-10-CM | POA: Diagnosis not present

## 2021-10-06 DIAGNOSIS — Z1151 Encounter for screening for human papillomavirus (HPV): Secondary | ICD-10-CM | POA: Diagnosis present

## 2021-10-06 DIAGNOSIS — D252 Subserosal leiomyoma of uterus: Secondary | ICD-10-CM | POA: Diagnosis not present

## 2021-10-06 DIAGNOSIS — Z3202 Encounter for pregnancy test, result negative: Secondary | ICD-10-CM

## 2021-10-06 DIAGNOSIS — N871 Moderate cervical dysplasia: Secondary | ICD-10-CM

## 2021-10-06 DIAGNOSIS — R52 Pain, unspecified: Secondary | ICD-10-CM

## 2021-10-06 LAB — POCT PREGNANCY, URINE: Preg Test, Ur: NEGATIVE

## 2021-10-06 MED ORDER — IBUPROFEN 800 MG PO TABS
800.0000 mg | ORAL_TABLET | Freq: Once | ORAL | Status: AC
Start: 2021-10-06 — End: 2021-10-06
  Administered 2021-10-06: 11:00:00 800 mg via ORAL

## 2021-10-06 NOTE — Addendum Note (Signed)
Addended by: Seth Bake on: 10/06/2021 11:29 AM   Modules accepted: Orders

## 2021-10-06 NOTE — Addendum Note (Signed)
Addended by: Woodroe Mode on: 10/06/2021 11:49 AM   Modules accepted: Orders

## 2021-10-06 NOTE — Progress Notes (Signed)
Patient given informed consent, signed copy in the chart, time out was performed. Appropriate time out taken. . The patient was placed in the lithotomy position and the cervix brought into view with sterile speculum.  Portio of cervix cleansed x 2 with betadine swabs.  A tenaculum was placed in the anterior lip of the cervix.  The uterus was sounded for depth of 6 mm, cervical dilators were used. A pipelle was introduced to into the uterus, suction created,  and a scanty endometrial sample was obtained. All equipment was removed and accounted for.  The patient tolerated the procedure well.    Patient given post procedure instructions. The patient is scheduled for [place] with Dr. Elgie Congo 11/04/21  Woodroe Mode, MD 10/06/2021

## 2021-10-11 ENCOUNTER — Other Ambulatory Visit: Payer: Self-pay

## 2021-10-11 ENCOUNTER — Other Ambulatory Visit: Payer: Self-pay | Admitting: Nurse Practitioner

## 2021-10-11 ENCOUNTER — Other Ambulatory Visit: Payer: Medicaid Other

## 2021-10-11 DIAGNOSIS — R5383 Other fatigue: Secondary | ICD-10-CM

## 2021-10-11 DIAGNOSIS — D509 Iron deficiency anemia, unspecified: Secondary | ICD-10-CM | POA: Diagnosis not present

## 2021-10-11 DIAGNOSIS — Z79899 Other long term (current) drug therapy: Secondary | ICD-10-CM | POA: Diagnosis not present

## 2021-10-11 DIAGNOSIS — G894 Chronic pain syndrome: Secondary | ICD-10-CM | POA: Diagnosis not present

## 2021-10-11 LAB — SURGICAL PATHOLOGY

## 2021-10-12 LAB — CBC WITH DIFFERENTIAL/PLATELET
Basophils Absolute: 0.1 10*3/uL (ref 0.0–0.2)
Basos: 1 %
EOS (ABSOLUTE): 0.1 10*3/uL (ref 0.0–0.4)
Eos: 2 %
Hematocrit: 32.6 % — ABNORMAL LOW (ref 34.0–46.6)
Hemoglobin: 10 g/dL — ABNORMAL LOW (ref 11.1–15.9)
Lymphocytes Absolute: 2.7 10*3/uL (ref 0.7–3.1)
Lymphs: 35 %
MCH: 24 pg — ABNORMAL LOW (ref 26.6–33.0)
MCHC: 30.7 g/dL — ABNORMAL LOW (ref 31.5–35.7)
MCV: 78 fL — ABNORMAL LOW (ref 79–97)
Monocytes Absolute: 0.5 10*3/uL (ref 0.1–0.9)
Monocytes: 7 %
Neutrophils Absolute: 4.4 10*3/uL (ref 1.4–7.0)
Neutrophils: 55 %
Platelets: 524 10*3/uL — ABNORMAL HIGH (ref 150–450)
RBC: 4.16 x10E6/uL (ref 3.77–5.28)
RDW: 23.6 % — ABNORMAL HIGH (ref 11.7–15.4)
WBC: 7.9 10*3/uL (ref 3.4–10.8)

## 2021-10-12 LAB — CYTOLOGY - PAP
Chlamydia: POSITIVE — AB
Comment: NEGATIVE
Comment: NEGATIVE
Comment: NEGATIVE
Comment: NORMAL
Diagnosis: NEGATIVE
High risk HPV: NEGATIVE
Neisseria Gonorrhea: NEGATIVE
Trichomonas: POSITIVE — AB

## 2021-10-13 ENCOUNTER — Telehealth: Payer: Self-pay

## 2021-10-13 DIAGNOSIS — A749 Chlamydial infection, unspecified: Secondary | ICD-10-CM

## 2021-10-13 DIAGNOSIS — A5901 Trichomonal vulvovaginitis: Secondary | ICD-10-CM

## 2021-10-13 MED ORDER — DOXYCYCLINE HYCLATE 100 MG PO CAPS: 100.0000 mg | ORAL_CAPSULE | Freq: Two times a day (BID) | ORAL | 0 refills | Status: DC

## 2021-10-13 MED ORDER — METRONIDAZOLE 500 MG PO TABS: 500.0000 mg | ORAL_TABLET | Freq: Two times a day (BID) | ORAL | 0 refills | Status: DC

## 2021-10-13 NOTE — Telephone Encounter (Signed)
STD report received via fax. Patient tested positive for Trichomonas and Chlamydia. Results not yet reviewed by provider. Called pt; results given. Reviewed treatment per protocol and antibiotics sent to pharmacy. Explained pt will need test for reinfection in 3 months. Patient agreeable to appt for vaginal swab, does not have day/time preference.

## 2021-10-15 LAB — COMP. METABOLIC PANEL (12)
AST: 17 IU/L (ref 0–40)
Albumin/Globulin Ratio: 1.5 (ref 1.2–2.2)
Albumin: 4.6 g/dL (ref 3.8–4.8)
Alkaline Phosphatase: 66 IU/L (ref 44–121)
BUN/Creatinine Ratio: 12 (ref 9–23)
BUN: 10 mg/dL (ref 6–20)
Bilirubin Total: <0.2 mg/dL (ref 0.0–1.2)
Calcium: 9.6 mg/dL (ref 8.7–10.2)
Chloride: 102 mmol/L (ref 96–106)
Creatinine, Ser: 0.84 mg/dL (ref 0.57–1.00)
Globulin, Total: 3 g/dL (ref 1.5–4.5)
Glucose: 104 mg/dL — ABNORMAL HIGH (ref 70–99)
Potassium: 4.4 mmol/L (ref 3.5–5.2)
Sodium: 139 mmol/L (ref 134–144)
Total Protein: 7.6 g/dL (ref 6.0–8.5)
eGFR: 91 mL/min/{1.73_m2} (ref >59)

## 2021-10-15 LAB — CBC WITH DIFFERENTIAL/PLATELET

## 2021-10-15 LAB — IRON,TIBC AND FERRITIN PANEL
Ferritin: 57 ng/mL (ref 15–150)
Iron Saturation: 10 % — ABNORMAL LOW (ref 15–55)
Iron: 33 ug/dL (ref 27–159)
Total Iron Binding Capacity: 339 ug/dL (ref 250–450)
UIBC: 306 ug/dL (ref 131–425)

## 2021-10-17 ENCOUNTER — Ambulatory Visit: Payer: Self-pay | Admitting: Nurse Practitioner

## 2021-10-20 ENCOUNTER — Telehealth (INDEPENDENT_AMBULATORY_CARE_PROVIDER_SITE_OTHER): Payer: Medicaid Other | Admitting: Psychiatry

## 2021-10-20 ENCOUNTER — Encounter (HOSPITAL_COMMUNITY): Payer: Self-pay | Admitting: Psychiatry

## 2021-10-20 ENCOUNTER — Encounter: Payer: Self-pay | Admitting: Nurse Practitioner

## 2021-10-20 ENCOUNTER — Other Ambulatory Visit: Payer: Self-pay

## 2021-10-20 ENCOUNTER — Ambulatory Visit: Payer: Self-pay | Admitting: Nurse Practitioner

## 2021-10-20 ENCOUNTER — Ambulatory Visit (INDEPENDENT_AMBULATORY_CARE_PROVIDER_SITE_OTHER): Payer: Medicaid Other | Admitting: Nurse Practitioner

## 2021-10-20 VITALS — BP 127/78 | HR 81 | Temp 98.6°F | Ht 64.0 in | Wt 259.4 lb

## 2021-10-20 DIAGNOSIS — F331 Major depressive disorder, recurrent, moderate: Secondary | ICD-10-CM

## 2021-10-20 DIAGNOSIS — J449 Chronic obstructive pulmonary disease, unspecified: Secondary | ICD-10-CM

## 2021-10-20 DIAGNOSIS — D509 Iron deficiency anemia, unspecified: Secondary | ICD-10-CM | POA: Diagnosis not present

## 2021-10-20 DIAGNOSIS — Z6841 Body Mass Index (BMI) 40.0 and over, adult: Secondary | ICD-10-CM

## 2021-10-20 DIAGNOSIS — R5383 Other fatigue: Secondary | ICD-10-CM | POA: Diagnosis not present

## 2021-10-20 DIAGNOSIS — T148XXA Other injury of unspecified body region, initial encounter: Secondary | ICD-10-CM | POA: Diagnosis not present

## 2021-10-20 DIAGNOSIS — G4700 Insomnia, unspecified: Secondary | ICD-10-CM

## 2021-10-20 DIAGNOSIS — F411 Generalized anxiety disorder: Secondary | ICD-10-CM

## 2021-10-20 MED ORDER — DULOXETINE HCL 40 MG PO CPEP: 1.0000 | ORAL_CAPSULE | Freq: Every day | ORAL | 3 refills | Status: DC

## 2021-10-20 MED ORDER — ZOLPIDEM TARTRATE 5 MG PO TABS: 5.0000 mg | ORAL_TABLET | Freq: Every evening | ORAL | 2 refills | Status: DC | PRN

## 2021-10-20 MED ORDER — GABAPENTIN 300 MG PO CAPS: 300.0000 mg | ORAL_CAPSULE | Freq: Three times a day (TID) | ORAL | 3 refills | Status: DC

## 2021-10-20 NOTE — Patient Instructions (Signed)
Fatigue °If you have fatigue, you feel tired all the time and have a lack of energy or a lack of motivation. Fatigue may make it difficult to start or complete tasks because of exhaustion. In general, occasional or mild fatigue is often a normal response to activity or life. However, long-lasting (chronic) or extreme fatigue may be a symptom of a medical condition. °Follow these instructions at home: °General instructions °Watch your fatigue for any changes. °Go to bed and get up at the same time every day. °Avoid fatigue by pacing yourself during the day and getting enough sleep at night. °Maintain a healthy weight. °Medicines °Take over-the-counter and prescription medicines only as told by your health care provider. °Take a multivitamin, if told by your health care provider.  °Do not use herbal or dietary supplements unless they are approved by your health care provider. °Activity ° °Exercise regularly, as told by your health care provider. °Use or practice techniques to help you relax, such as yoga, tai chi, meditation, or massage therapy. °Eating and drinking ° °Avoid heavy meals in the evening. °Eat a well-balanced diet, which includes lean proteins, whole grains, plenty of fruits and vegetables, and low-fat dairy products. °Avoid consuming too much caffeine. °Avoid the use of alcohol. °Drink enough fluid to keep your urine pale yellow. °Lifestyle °Change situations that cause you stress. Try to keep your work and personal schedule in balance. °Do not use any products that contain nicotine or tobacco, such as cigarettes and e-cigarettes. If you need help quitting, ask your health care provider. °Do not use drugs. °Contact a health care provider if: °Your fatigue does not get better. °You have a fever. °You suddenly lose or gain weight. °You have headaches. °You have trouble falling asleep or sleeping through the night. °You feel angry, guilty, anxious, or sad. °You are unable to have a bowel movement  (constipation). °Your skin is dry. °You have swelling in your legs or another part of your body. °Get help right away if: °You feel confused. °Your vision is blurry. °You feel faint or you pass out. °You have a severe headache. °You have severe pain in your abdomen, your back, or the area between your waist and hips (pelvis). °You have chest pain, shortness of breath, or an irregular or fast heartbeat. °You are unable to urinate, or you urinate less than normal. °You have abnormal bleeding, such as bleeding from the rectum, vagina, nose, lungs, or nipples. °You vomit blood. °You have thoughts about hurting yourself or others. °If you ever feel like you may hurt yourself or others, or have thoughts about taking your own life, get help right away. You can go to your nearest emergency department or call: °Your local emergency services (911 in the [place] °A suicide crisis helpline, such as the National Suicide Prevention Lifeline at 1-800-273-8255 or 988 in the [place] This is open 24 hours a day. °Summary °If you have fatigue, you feel tired all the time and have a lack of energy or a lack of motivation. °Fatigue may make it difficult to start or complete tasks because of exhaustion. °Long-lasting (chronic) or extreme fatigue may be a symptom of a medical condition. °Exercise regularly, as told by your health care provider. °Change situations that cause you stress. Try to keep your work and personal schedule in balance. °This information is not intended to replace advice given to you by your health care provider. Make sure you discuss any questions you have with your health care provider. °Document Revised:   04/20/2021 Document Reviewed: 08/05/2020 °Elsevier Patient Education © 2022 [employer] ° °

## 2021-10-20 NOTE — Progress Notes (Signed)
[place], [place]  [address] Phone:  [phone]   Fax:  [phone]   Established Patient Office Visit  Subjective:  Patient ID: Nancy Dickerson, female    DOB: 11/16/1980  Age: 41 y.o. MRN: [id number]  CC:  Chief Complaint  Patient presents with   Follow-up    Pt is here today for her 6 week follow up visit.  Pt states that she still been feeling tired.    HPI Nancy Dickerson presents for follow up. She  has a past medical history of Anemia, Asthma, Bursitis of hip, Cardiomegaly, Chondromalacia of hip, Chondromalacia of right knee, COPD (chronic obstructive pulmonary disease) ([place]), GERD (gastroesophageal reflux disease), H/O transfusion of packed red blood cells, and Tinnitus.   Fatigue Patient complains of fatigue. Symptoms began several months ago. The patient feels the fatigue began with: change in hair texture, excessive menstrual bleeding, and leg cramps . Symptoms of her fatigue have been general malaise. Patient describes the following psychological symptoms: stress at work. Patient denies cold intolerance, exercise intolerance, fever, GI blood loss, symptoms of arthritis, unusual rashes, and witnessed or suspected sleep apnea. Symptoms have gradually worsened. Symptom severity: struggles to carry out day to day responsibilities.. Previous visits for this problem: multiple, this is a longstanding diagnosis. Last seen a few weeks ago by me. She has noticed that the fatigue is it is worse with heat.   Past Medical History:  Diagnosis Date   Anemia    Asthma    Bursitis of hip    Cardiomegaly    Chondromalacia of hip    Chondromalacia of right knee    COPD (chronic obstructive pulmonary disease) (HCC)    GERD (gastroesophageal reflux disease)    H/O transfusion of packed red blood cells    Tinnitus     Past Surgical History:  Procedure Laterality Date   CERVICAL CONIZATION W/BX N/A 06/25/2019   Procedure: CONIZATION CERVIX  WITH BIOPSY - COLD KNIFE;  Surgeon: Emily Filbert, MD;  Location: [facility];  Service: Gynecology;  Laterality: N/A;   ENDOMETRIAL ABLATION N/A 06/25/2019   Procedure: MINERVA ABLATION;  Surgeon: Emily Filbert, MD;  Location: [facility];  Service: Gynecology;  Laterality: N/A;  Minerva rep will here confirmed on 06/20/19 CS   HYSTEROSCOPY WITH D & C  06/25/2019   Procedure: DILATATION AND CURETTAGE /HYSTEROSCOPY;  Surgeon: Emily Filbert, MD;  Location: [facility];  Service: Gynecology;;   NO PAST SURGERIES      Family History  Problem Relation Age of Onset   Sudden death Father 17    Social History   Socioeconomic History   Marital status: Single    Spouse name: Not on file   Number of children: 6   Years of education: 12   Highest education level: Not on file  Occupational History   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  Tobacco Use   Smoking status: Never   Smokeless tobacco: Never  Vaping Use   Vaping Use: Never used  Substance and Sexual Activity   Alcohol use: Yes    Comment: rare   Drug use: Yes    Types: Marijuana   Sexual activity: Yes  Other Topics Concern   Not on file  Social History Narrative   ** Merged History Encounter **         Lives with someone   Right handed    Little caffeine - drinks  Sprite   Education - some college          Social Determinants of Radio broadcast assistant Strain: Not on file  Food Insecurity: No Food Insecurity   Worried About Charity fundraiser in the Last Year: Never true   Arboriculturist in the Last Year: Never true  Transportation Needs: No Transportation Needs   Lack of Transportation (Medical): No   Lack of Transportation (Non-Medical): No  Physical Activity: Not on file  Stress: Not on file  Social Connections: Not on file  Intimate Partner Violence: Not on file    Outpatient Medications Prior to Visit  Medication Sig Dispense Refill   albuterol (PROVENTIL) (2.5 MG/3ML) 0.083%  nebulizer solution 2.5 mg every 6 (six) hours as needed. 75 mL 1   albuterol (VENTOLIN HFA) 108 (90 Base) MCG/ACT inhaler Inhale 2 puffs into the lungs every 6 (six) hours as needed for wheezing or shortness of breath. 8 g 2   doxycycline (VIBRAMYCIN) 100 MG capsule Take 1 capsule (100 mg total) by mouth 2 (two) times daily. 14 capsule 0   DULoxetine HCl 40 MG CPEP Take 1 tablet by mouth daily. 30 capsule 3   fluticasone (FLOVENT HFA) 110 MCG/ACT inhaler Inhale 2 puffs into the lungs 2 (two) times daily. 1 each 1   gabapentin (NEURONTIN) 300 MG capsule Take 1 capsule (300 mg total) by mouth 3 (three) times daily. 90 capsule 3   metroNIDAZOLE (FLAGYL) 500 MG tablet Take 1 tablet (500 mg total) by mouth 2 (two) times daily. 14 tablet 0   montelukast (SINGULAIR) 10 MG tablet Take 1 tablet (10 mg total) by mouth at bedtime. 30 tablet 3   Multiple Vitamin (MULTI-VITAMIN) tablet Take by mouth.     omeprazole (PRILOSEC) 20 MG capsule Take 1 capsule (20 mg total) by mouth daily. 30 capsule 3   zolpidem (AMBIEN) 5 MG tablet Take 1 tablet (5 mg total) by mouth at bedtime as needed for sleep. 30 tablet 2   metoprolol tartrate (LOPRESSOR) 25 MG tablet Take 1 tablet (25 mg total) by mouth 2 (two) times daily. 180 tablet 3   naproxen (NAPROSYN) 500 MG tablet Take 1 tablet (500 mg total) by mouth 2 (two) times daily with a meal. (Patient not taking: Reported on 09/08/2021) 30 tablet 0   tranexamic acid (LYSTEDA) 650 MG TABS tablet Take 2 tablets (1,300 mg total) by mouth 3 (three) times daily. Take during menses for a maximum of five days (Patient not taking: Reported on 09/08/2021) 30 tablet 2   No facility-administered medications prior to visit.    No Known Allergies  ROS Review of Systems    Objective:    Physical Exam Constitutional:      Appearance: She is obese.  HENT:     Head: Normocephalic and atraumatic.     Right Ear: Tympanic membrane normal.     Left Ear: Tympanic membrane normal.      Nose: Nose normal.     Mouth/Throat:     Mouth: Mucous membranes are moist.  Cardiovascular:     Rate and Rhythm: Normal rate and regular rhythm.     Pulses: Normal pulses.  Pulmonary:     Breath sounds: Normal breath sounds.  Abdominal:     Palpations: Abdomen is soft.  Musculoskeletal:     Cervical back: Normal range of motion.     Right lower leg: No edema.     Left lower leg: No edema.  Skin:    General: Skin is warm and dry.     Capillary Refill: Capillary refill takes less than 2 seconds.     Comments: Resolving ecchymosis    Neurological:     General: No focal deficit present.     Mental Status: She is alert and oriented to person, place, and time.  Psychiatric:        Mood and Affect: Mood normal.        Behavior: Behavior normal.        Thought Content: Thought content normal.        Judgment: Judgment normal.    BP 127/78    Pulse 81    Temp 98.6 F (37 C)    Ht _0  (1.626 m)    Wt 259 lb 6.4 oz (117.7 kg)    SpO2 98%    BMI 44.53 kg/m  Wt Readings from Last 3 Encounters:  10/20/21 259 lb 6.4 oz (117.7 kg)  10/06/21 255 lb 12.8 oz (116 kg)  09/21/21 253 lb (114.8 kg)     There are no preventive care reminders to display for this patient.   There are no preventive care reminders to display for this patient.  Lab Results  Component Value Date   TSH 0.665 09/08/2021   Lab Results  Component Value Date   WBC CANCELED 10/11/2021   HGB CANCELED 10/11/2021   HCT CANCELED 10/11/2021   MCV 78 (L) 10/11/2021   PLT CANCELED 10/11/2021   Lab Results  Component Value Date   NA 139 10/11/2021   K 4.4 10/11/2021   CO2 24 10/27/2020   GLUCOSE 104 (H) 10/11/2021   BUN 10 10/11/2021   CREATININE 0.84 10/11/2021   BILITOT <0.2 10/11/2021   ALKPHOS 66 10/11/2021   AST 17 10/11/2021   ALT 21 05/13/2019   PROT 7.6 10/11/2021   ALBUMIN 4.6 10/11/2021   CALCIUM 9.6 10/11/2021   ANIONGAP 11 10/27/2020   EGFR 91 10/11/2021   Lab Results  Component Value  Date   CHOL 157 09/08/2021   Lab Results  Component Value Date   HDL 50 09/08/2021   Lab Results  Component Value Date   LDLCALC 91 09/08/2021   Lab Results  Component Value Date   TRIG 83 09/08/2021   Lab Results  Component Value Date   CHOLHDL 3.1 09/08/2021   Lab Results  Component Value Date   HGBA1C 5.7 (H) 06/03/2020      Assessment & Plan:   Problem List Items Addressed This Visit       Respiratory   Chronic obstructive pulmonary disease (HCC) Stable     Other   Class 3 severe obesity due to excess calories without serious comorbidity with body mass index (BMI) of 40.0 to 44.9 in adult ([place]) Obesity with BMI and comorbidities as noted above.  Discussed proper diet (low fat, low sodium, high fiber) with patient.   Discussed need for regular exercise (3 times per week, 20 minutes per session) with patient.    Moderate episode of recurrent major depressive disorder (HCC) Stable    Other Visit Diagnoses     Fatigue, unspecified type    -  Primary Worsening   Iron deficiency anemia, unspecified iron deficiency anemia type    Declines oral supplement not effective Swelling with IV infusion (feraheme)     Bruising           No orders of the defined types were placed in this encounter.  Follow-up: Return in about 6 weeks (around 12/01/2021) for [id number].    Vevelyn Francois, NP

## 2021-10-20 NOTE — Progress Notes (Signed)
BH MD/PA/NP OP Progress Note Virtual Visit via Video Note  I connected with Nancy Dickerson on 08/17/2021 at  2:30 PM EST by a video enabled telemedicine application and verified that I am speaking with the correct person using two identifiers.  Location: Patient: Home Provider: Clinic   I discussed the limitations of evaluation and management by telemedicine and the availability of in person appointments. The patient expressed understanding and agreed to proceed.  I provided 30 minutes of non-face-to-face time during this encounter.    10/20/2021 2:55 PM Nancy Dickerson  MRN:  [id number]  Chief Complaint:  "I have been having a lot of anxiety"  HPI: 41 year old female seen today for follow up psychiatric evaluation.  She has psychiatric history of anxiety, depression, and insomnia. She is currently managed on Cymbalta 40 mg daily and Ambien 5 mg nightly as needed. She notes that  her medications are somewhat effective in managing her psychiatric conditions.  Today, patient was well groomed, pleasant, cooperative, engaged in conversation and maintained eye contact. She informed provider she has been having increased anxiety.  She notes that she is worried about her children (39 year old daughter, 40 year old son, and 8 year old daughter).  She also notes that she was excited about her upcoming divorce but notes that this is going to be adjustment.  She informed Probation officer that she and her children's father are not getting along.  Patient informed Probation officer that she will also be having surgery on her fibroids on January 24 which she notes she is worried about.  Provider conducted a GAD-7 and patient scored an 11.  Provider also conducted PHQ-9 and patient scored a 12.  She did inform her that she is sleeping approximately 7 hours nightly with addition of Ambien.    Patient informed Probation officer that she continues to be in pain most days.  She reports that recently she fell at work and is having left-sided  pain.  She will follow-up with her PCP to address this concern if pain becomes worse.    Patient asks Probation officer to write letter to employer explaining how she has mental illnesses and having increased anxiety.  Patient requested breaks throughout the day to help manage her anxiety.  Provider was agreeable to this request.  Today patient agreeable to start gabapentin 300 mg 3 times daily to help manage anxiety and pain.  She will continue her other medications as prescribed.  No other concerns at this time     Visit Diagnosis:    ICD-10-CM   1. Generalized anxiety disorder  F41.1 gabapentin (NEURONTIN) 300 MG capsule    DULoxetine HCl 40 MG CPEP    2. Moderate episode of recurrent major depressive disorder (HCC)  F33.1 DULoxetine HCl 40 MG CPEP    3. Insomnia, unspecified type  G47.00 zolpidem (AMBIEN) 5 MG tablet      Past Psychiatric History: Anxiety, Depression, and insomnia    Past Medical History:  Past Medical History:  Diagnosis Date   Anemia    Asthma    Bursitis of hip    Cardiomegaly    Chondromalacia of hip    Chondromalacia of right knee    COPD (chronic obstructive pulmonary disease) (HCC)    GERD (gastroesophageal reflux disease)    H/O transfusion of packed red blood cells    Tinnitus     Past Surgical History:  Procedure Laterality Date   CERVICAL CONIZATION W/BX N/A 06/25/2019   Procedure: CONIZATION CERVIX WITH BIOPSY - COLD KNIFE;  Surgeon: Clovia Cuff  C, MD;  Location: [place];  Service: Gynecology;  Laterality: N/A;   ENDOMETRIAL ABLATION N/A 06/25/2019   Procedure: MINERVA ABLATION;  Surgeon: Emily Filbert, MD;  Location: [place];  Service: Gynecology;  Laterality: N/A;  [employer] rep will here confirmed on 06/20/19 CS   HYSTEROSCOPY WITH D & C  06/25/2019   Procedure: DILATATION AND CURETTAGE /HYSTEROSCOPY;  Surgeon: Emily Filbert, MD;  Location: [place];  Service: Gynecology;;   NO PAST SURGERIES       Family Psychiatric History: Daughter SA, Mother Bipolar disorder, and sister Bipolar disorder  Family History:  Family History  Problem Relation Age of Onset   Sudden death Father 36    Social History:  Social History   Socioeconomic History   Marital status: Single    Spouse name: Not on file   Number of children: 6   Years of education: 12   Highest education level: Not on file  Occupational History   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  Tobacco Use   Smoking status: Never   Smokeless tobacco: Never  Vaping Use   Vaping Use: Never used  Substance and Sexual Activity   Alcohol use: Yes    Comment: rare   Drug use: Yes    Types: Marijuana   Sexual activity: Yes  Other Topics Concern   Not on file  Social History Narrative   ** Merged History Encounter **         Lives with someone   Right handed    Little caffeine - drinks Sprite   Education - some college          Social Determinants of Health   Financial Resource Strain: Not on file  Food Insecurity: No Food Insecurity   Worried About Charity fundraiser in the Last Year: Never true   Arboriculturist in the Last Year: Never true  Transportation Needs: No Transportation Needs   Lack of Transportation (Medical): No   Lack of Transportation (Non-Medical): No  Physical Activity: Not on file  Stress: Not on file  Social Connections: Not on file    Allergies: No Known Allergies  Metabolic Disorder Labs: Lab Results  Component Value Date   HGBA1C 5.7 (H) 06/03/2020   MPG 99.67 01/30/2018   No results found for: PROLACTIN Lab Results  Component Value Date   CHOL 157 09/08/2021   TRIG 83 09/08/2021   HDL 50 09/08/2021   CHOLHDL 3.1 09/08/2021   VLDL 18 01/30/2018   LDLCALC 91 09/08/2021   LDLCALC 58 01/30/2018   Lab Results  Component Value Date   TSH 0.665 09/08/2021   TSH 0.768 07/08/2018    Therapeutic Level Labs: No results found for: LITHIUM No results found for: VALPROATE No components found  for:  CBMZ  Current Medications: Current Outpatient Medications  Medication Sig Dispense Refill   gabapentin (NEURONTIN) 300 MG capsule Take 1 capsule (300 mg total) by mouth 3 (three) times daily. 90 capsule 3   albuterol (PROVENTIL) (2.5 MG/3ML) 0.083% nebulizer solution 2.5 mg every 6 (six) hours as needed. 75 mL 1   albuterol (VENTOLIN HFA) 108 (90 Base) MCG/ACT inhaler Inhale 2 puffs into the lungs every 6 (six) hours as needed for wheezing or shortness of breath. 8 g 2   doxycycline (VIBRAMYCIN) 100 MG capsule Take 1 capsule (100 mg total) by mouth 2 (two) times daily. 14 capsule 0   DULoxetine HCl 40 MG CPEP Take 1  tablet by mouth daily. 30 capsule 3   fluticasone (FLOVENT HFA) 110 MCG/ACT inhaler Inhale 2 puffs into the lungs 2 (two) times daily. 1 each 1   metoprolol tartrate (LOPRESSOR) 25 MG tablet Take 1 tablet (25 mg total) by mouth 2 (two) times daily. 180 tablet 3   metroNIDAZOLE (FLAGYL) 500 MG tablet Take 1 tablet (500 mg total) by mouth 2 (two) times daily. 14 tablet 0   montelukast (SINGULAIR) 10 MG tablet Take 1 tablet (10 mg total) by mouth at bedtime. 30 tablet 3   Multiple Vitamin (MULTI-VITAMIN) tablet Take by mouth.     naproxen (NAPROSYN) 500 MG tablet Take 1 tablet (500 mg total) by mouth 2 (two) times daily with a meal. (Patient not taking: Reported on 09/08/2021) 30 tablet 0   omeprazole (PRILOSEC) 20 MG capsule Take 1 capsule (20 mg total) by mouth daily. 30 capsule 3   tranexamic acid (LYSTEDA) 650 MG TABS tablet Take 2 tablets (1,300 mg total) by mouth 3 (three) times daily. Take during menses for a maximum of five days (Patient not taking: Reported on 09/08/2021) 30 tablet 2   zolpidem (AMBIEN) 5 MG tablet Take 1 tablet (5 mg total) by mouth at bedtime as needed for sleep. 30 tablet 2   No current facility-administered medications for this visit.     Musculoskeletal: Strength & Muscle Tone:  Unable to assess due to telehealth visit [place]:  Unable to  assess due to telehealth visit Patient leans: N/A  Psychiatric Specialty Exam: Review of Systems  There were no vitals taken for this visit.There is no height or weight on file to calculate BMI.  General Appearance: Well Groomed  Eye Contact:  Good  Speech:  Clear and Coherent and Normal Rate  Volume:  Normal  Mood:  Anxious and Depressed  Affect:  Appropriate and Congruent  Thought Process:  Coherent, Goal Directed and Linear  Orientation:  Full (Time, Place, and Person)  Thought Content: WDL and Logical   Suicidal Thoughts:  No  Homicidal Thoughts:  No  Memory:  Immediate;   Good Recent;   Good Remote;   Good  Judgement:  Good  Insight:  Good  Psychomotor Activity:  Normal  Concentration:  Concentration: Good and Attention Span: Good  Recall:  Good  Fund of Knowledge: Good  Language: Good  Akathisia:  No  Handed:  Right  AIMS (if indicated): Not done  Assets:  Communication Skills Desire for Improvement Financial Resources/Insurance Housing Intimacy Physical Health Social Support  ADL's:  Intact  Cognition: WNL  Sleep:  Good   Screenings: GAD-7    Flowsheet Row Video Visit from 10/20/2021 in [facility] Procedure visit from 10/06/2021 in [facility] [place] [facility] Office Visit from 06/23/2021 in [facility] [place] at [facility] Video Visit from 05/18/2021 in [facility] Video Visit from 02/15/2021 in [facility]  Total GAD-7 Score 11 0 9 14 7       PHQ2-9    Flowsheet Row Video Visit from 10/20/2021 in [facility] Procedure visit from 10/06/2021 in [place] [facility] [place] [facility] Office Visit from 09/08/2021 in [place] Office Visit from 06/23/2021 in Center for [place] at [facility] Video Visit from  05/18/2021 in [place]  PHQ-2 Total Score 4 0 0  2 5  PHQ-9 Total Score 12 0 -- 9 [place] ED from 08/07/2021 in [place] [facility] Most recent reading at 08/07/2021 10:06 AM ED from 08/07/2021 in [facility] at [facility]  Most recent reading at 08/07/2021  8:33 AM ED from 07/14/2021 in [facility] at [facility]  Most recent reading at 07/14/2021  6:51 PM  C-SSRS RISK CATEGORY No Risk No Risk No Risk        Assessment and Plan: Patient notes that she continues to have increased life stressors that exacerbates her anxiety and depression.  She is worried about her children, her upcoming divorce, her health, and upcoming surgery.  Patient informed Probation officer that her mental health is interfering with her work life. Patient asks Probation officer to write letter to employer explaining how she has mental illnesses and having increased anxiety.  Patient requested breaks throughout the day to help manage her anxiety.  Provider was agreeable to this request.  Today patient agreeable to start gabapentin 300 mg 3 times daily to help manage anxiety and pain.  She will continue her other medications as prescribed.     1. Generalized anxiety disorder  Start- gabapentin (NEURONTIN) 300 MG capsule; Take 1 capsule (300 mg total) by mouth 3 (three) times daily.  Dispense: 90 capsule; Refill: 3 Continue- DULoxetine HCl 40 MG CPEP; Take 1 tablet by mouth daily.  Dispense: 30 capsule; Refill: 3  2. Moderate episode of recurrent major depressive disorder (HCC)  Continue- DULoxetine HCl 40 MG CPEP; Take 1 tablet by mouth daily.  Dispense: 30 capsule; Refill: 3  3. Insomnia, unspecified type  Continue- zolpidem (AMBIEN) 5 MG tablet; Take 1 tablet (5 mg total) by mouth at bedtime as needed for sleep.  Dispense: 30 tablet; Refill: 2    Follow up in 3 months  Salley Slaughter, NP 10/20/2021, 2:55 PM

## 2021-10-24 ENCOUNTER — Ambulatory Visit (INDEPENDENT_AMBULATORY_CARE_PROVIDER_SITE_OTHER): Payer: Medicaid Other

## 2021-10-24 ENCOUNTER — Encounter (HOSPITAL_COMMUNITY): Payer: Self-pay | Admitting: Emergency Medicine

## 2021-10-24 ENCOUNTER — Other Ambulatory Visit: Payer: Self-pay

## 2021-10-24 ENCOUNTER — Ambulatory Visit (HOSPITAL_COMMUNITY)
Admission: EM | Admit: 2021-10-24 | Discharge: 2021-10-24 | Disposition: A | Discharge status: Home / Self Care | Payer: Medicaid Other | Attending: Sports Medicine | Admitting: Sports Medicine

## 2021-10-24 DIAGNOSIS — M79644 Pain in right finger(s): Secondary | ICD-10-CM | POA: Diagnosis not present

## 2021-10-24 DIAGNOSIS — M79641 Pain in right hand: Secondary | ICD-10-CM

## 2021-10-24 DIAGNOSIS — W19XXXA Unspecified fall, initial encounter: Secondary | ICD-10-CM

## 2021-10-24 DIAGNOSIS — S60221A Contusion of right hand, initial encounter: Secondary | ICD-10-CM

## 2021-10-24 MED ORDER — DICLOFENAC SODIUM 1 % EX GEL: 4.0000 g | Freq: Four times a day (QID) | CUTANEOUS | 0 refills | Status: DC

## 2021-10-24 NOTE — ED Provider Notes (Signed)
[place]    CSN: [id number] Arrival date & time: 10/24/21  1359      History   Chief Complaint Chief Complaint  Patient presents with   Fall   Hand Pain    HPI Nancy Dickerson is a 41 y.o. female who presents with right hand and pinky finger pain s/p fall about 1 week ago.   Fall  Hand Pain   [place] about one week ago --> down steps, landed on the side on the ulnar aspect of her right hand and finger.  She initially had pains everywhere, and she twisted her left ankle and fell onto both knees of having some scrapes.  She did notice some bruising of the ulnar aspect of her right hand.  The bumps and bruises of her knees and ankle have been improving, but she still notes that her right hand and the fifth finger have still been causing her pain.  She bumped this on the steering well when she was driving other day and this was quite painful.  She presents today for evaluation.  She has not been applying any ice nor heat.  No medications have been taken for this.  She denies any prior injury to the hand or finger.   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   Chest congestion 07/15/2021   Acute cough 07/15/2021   Tachycardia 06/01/2021   SOB (shortness of breath) 06/01/2021   Attention deficit hyperactivity disorder (ADHD), combined type 01/13/2021   Chronic obstructive pulmonary disease ([place]) 11/26/2020   Moderate episode of recurrent major depressive disorder ([place]) 10/14/2020   Generalized anxiety disorder 10/14/2020   Dysphonia 08/25/2020   Laryngopharyngeal reflux (LPR) 08/25/2020   Pulsatile  tinnitus of right ear 08/25/2020   Dysfunctional uterine bleeding 06/03/2020   Patellofemoral pain syndrome of left knee 11/08/2019   Acute pain of left knee 08/13/2019   Insomnia 07/07/2019   Lumbar radicular pain 07/07/2019   Uterine leiomyoma 03/12/2018   Trochanteric bursitis of right hip 03/12/2018   Class 3 severe obesity due to excess calories without serious comorbidity with body mass index (BMI) of 40.0 to 44.9 in adult ([place]) 03/12/2018   Iron deficiency anemia due to chronic blood loss 01/29/2018   Right sided weakness 01/29/2018    Past Surgical History:  Procedure Laterality Date   CERVICAL CONIZATION W/BX N/A 06/25/2019   Procedure: CONIZATION CERVIX WITH BIOPSY - COLD KNIFE;  Surgeon: Emily Filbert, MD;  Location: [place];  Service: Gynecology;  Laterality: N/A;   ENDOMETRIAL ABLATION N/A 06/25/2019   Procedure: MINERVA ABLATION;  Surgeon: Emily Filbert, MD;  Location: [place];  Service: Gynecology;  Laterality: N/A;  [employer] rep will here confirmed on 06/20/19 CS   HYSTEROSCOPY WITH D & C  06/25/2019   Procedure: DILATATION AND CURETTAGE /HYSTEROSCOPY;  Surgeon: Emily Filbert, MD;  Location: [place];  Service: Gynecology;;   NO PAST SURGERIES      OB History  Gravida  7   Para  1   Term  1   Preterm      AB  1   Living  6      SAB  1   IAB      Ectopic      Multiple      Live Births               Home Medications    Prior to Admission medications   Medication Sig Start Date End Date Taking? Authorizing Provider  diclofenac Sodium (VOLTAREN) 1 % GEL Apply 4 g topically 4 (four) times daily. 10/24/21  Yes Elba Barman, DO  albuterol (PROVENTIL) (2.5 MG/3ML) 0.083% nebulizer solution 2.5 mg every 6 (six) hours as needed. 07/15/21   Fenton Foy, NP  albuterol (VENTOLIN HFA) 108 (90 Base) MCG/ACT inhaler Inhale 2 puffs into the lungs every 6 (six) hours as needed for wheezing or shortness of  breath. 09/08/21   Vevelyn Francois, NP  doxycycline (VIBRAMYCIN) 100 MG capsule Take 1 capsule (100 mg total) by mouth 2 (two) times daily. Patient not taking: Reported on 10/24/2021 10/13/21   Woodroe Mode, MD  DULoxetine HCl 40 MG CPEP Take 1 tablet by mouth daily. 10/20/21   Salley Slaughter, NP  fluticasone (FLOVENT HFA) 110 MCG/ACT inhaler Inhale 2 puffs into the lungs 2 (two) times daily. 09/08/21   Vevelyn Francois, NP  gabapentin (NEURONTIN) 300 MG capsule Take 1 capsule (300 mg total) by mouth 3 (three) times daily. Patient not taking: Reported on 10/24/2021 10/20/21   Salley Slaughter, NP  metoprolol tartrate (LOPRESSOR) 25 MG tablet Take 1 tablet (25 mg total) by mouth 2 (two) times daily. 12/31/20 10/06/21  Minus Breeding, MD  metroNIDAZOLE (FLAGYL) 500 MG tablet Take 1 tablet (500 mg total) by mouth 2 (two) times daily. Patient not taking: Reported on 10/24/2021 10/13/21   Woodroe Mode, MD  montelukast (SINGULAIR) 10 MG tablet Take 1 tablet (10 mg total) by mouth at bedtime. 07/15/21   Fenton Foy, NP  Multiple Vitamin (MULTI-VITAMIN) tablet Take by mouth.    [provider]  naproxen (NAPROSYN) 500 MG tablet Take 1 tablet (500 mg total) by mouth 2 (two) times daily with a meal. Patient not taking: Reported on 09/08/2021 07/14/21   Jaynee Eagles, PA-C  omeprazole (PRILOSEC) 20 MG capsule Take 1 capsule (20 mg total) by mouth daily. 07/15/21   Fenton Foy, NP  tranexamic acid (LYSTEDA) 650 MG TABS tablet Take 2 tablets (1,300 mg total) by mouth 3 (three) times daily. Take during menses for a maximum of five days Patient not taking: Reported on 09/08/2021 06/23/21   Griffin Basil, MD  zolpidem (AMBIEN) 5 MG tablet Take 1 tablet (5 mg total) by mouth at bedtime as needed for sleep. 10/20/21   Salley Slaughter, NP    Family History Family History  Problem Relation Age of Onset   Sudden death Father 69    Social History Social History   Tobacco Use   Smoking  status: Never   Smokeless tobacco: Never  Vaping Use   Vaping Use: Never used  Substance Use Topics   Alcohol use: Yes    Comment: rare   Drug use: Yes    Types: Marijuana     Allergies   Patient has no known allergies.   Review of Systems Review of Systems  Constitutional:  Negative for activity change, chills and fever.  Musculoskeletal:  Positive for arthralgias (right hand and 5th finger).  Skin:  Positive for color change.       + bruising    Physical Exam Triage Vital Signs ED Triage Vitals  Enc Vitals Group     BP 10/24/21 1506 130/85     Pulse Rate 10/24/21 1506 82     Resp 10/24/21 1506 20     Temp 10/24/21 1506 97.9 F (36.6 C)     Temp Source 10/24/21 1506 Oral     SpO2 10/24/21 1506 98 %     Weight --      Height --      Head Circumference --      Peak Flow --      Pain Score 10/24/21 1501 9     Pain Loc --      Pain Edu? --      Excl. in [place]? --    No data found.  Updated Vital Signs BP 130/85 (BP Location: Left Arm)    Pulse 82    Temp 97.9 F (36.6 C) (Oral)    Resp 20    SpO2 98%    Physical Exam Gen: Well-appearing, in no acute distress; non-toxic CV: Regular Rate. Well-perfused. Warm.  Resp: Breathing unlabored on room air; no wheezing. Psych: Fluid speech in conversation; appropriate affect; normal thought process Neuro: Sensation intact throughout. No gross coordination deficits.  MSK:  - Right hand/fingers: + TTP noted over the distal end of the fifth metacarpal and proximal aspect of the fifth phalange.  There is some ecchymosis on the lateral aspect of the ulnar hand.  Mild soft tissue swelling over this area as well.  There is some pain with flexion extension of the fifth digit.  Able to perform clenched fist, without malrotation or angulation.  Neurovascular intact distally.  Full range of motion about the wrist without pain.   UC Treatments / Results  Labs (all labs ordered are listed, but only abnormal results are  displayed) Labs Reviewed - No data to display  EKG   Radiology DG Hand Complete Right  Result Date: 10/24/2021 CLINICAL DATA:  Status post fall with right fifth digit pain. EXAM: RIGHT HAND - COMPLETE 3+ VIEW COMPARISON:  None. FINDINGS: There is no evidence of fracture or dislocation. Soft tissues are unremarkable. IMPRESSION: Negative. Electronically Signed   By: Abelardo Diesel M.D.   On: 10/24/2021 15:32    Procedures Procedures (including critical care time)  Medications Ordered in UC Medications - No data to display  Initial Impression / Assessment and Plan / UC Course  I have reviewed the triage vital signs and the nursing notes.  Pertinent labs & imaging results that were available during my care of the patient were reviewed by me and considered in my medical decision making (see chart for details).     Right hand pain; contusion of right hand  Patient presents with ongoing ulnar-sided hand/fifth finger pain s/p fall onto this hand 1 week ago.  There is pain on the ulnar side of the hypothenar eminence and fifth phalange.  X-ray did not demonstrate any evidence of fracture or dislocation.  This is likely a contusion of the hand, given the superficial swelling of the hypothenar eminence.  Supportive treatment was discussed such as ice, may apply topical foot Taryn gel over the area.  May use over-the-counter Tylenol or Motrin.  Instructed to keep the fingers moving to prevent stiffness.  Recommend following up with PCP or sports medicine clinic if  this does not improve over the next week or so.  Return precautions were provided.  Did apply a Ace wrap for protection over the hand and fingers. She is safe for discharge home.  Final Clinical Impressions(s) / UC Diagnoses   Final diagnoses:  Right hand pain  Contusion of right hand, initial encounter     Discharge Instructions      Ice the area Keep finger/hand moving Apply voltaren gel over painful area May use OTC-Tylenol  or Motrin for pain control Follow-up with PCP if not improving over next few weeks     ED Prescriptions     Medication Sig Dispense Auth. Provider   diclofenac Sodium (VOLTAREN) 1 % GEL Apply 4 g topically 4 (four) times daily. 4 g Elba Barman, DO      PDMP not reviewed this encounter.   Elba Barman, DO 10/24/21 1846

## 2021-10-24 NOTE — Discharge Instructions (Addendum)
Ice the area Keep finger/hand moving Apply voltaren gel over painful area May use OTC-Tylenol or Motrin for pain control Follow-up with PCP if not improving over next few weeks

## 2021-10-24 NOTE — ED Triage Notes (Signed)
Patient fell on steps one week ago.  Patient caught herself with right hand, lateral hand made contact first.  Right knee scraped, and odd feeling in anterior ankle when crossing ankles.    Since incident, patient outstretched fingers and felt significant sharp pain in side of right hand behind little finger.  Spreading fingers apart causes sharp pain

## 2021-10-25 ENCOUNTER — Telehealth: Payer: Self-pay

## 2021-10-25 NOTE — Telephone Encounter (Addendum)
-----   Message from Griffin Basil, MD sent at 10/25/2021  3:08 PM EST ----- Regarding: cancel procedure Pt has recent positive chlamydia and trich, cannot do procedure until [facility] is done.   Called pt to explain that surgery will need to be postponed until negative TOC for chlamydia and trich. Pt states she will find another doctor because she should have been notified of this sooner. Pt states she has already taken time off of work and this may not be able to be changed. Offered documentation explaining the rescheduled surgery for her workplace, pt declines. Explained to patient if she does decide to continue care with our office we can schedule TOC visit as soon as 11/17/21 and will reschedule [place] after receiving negative test. Pt disconnected call.

## 2021-10-28 NOTE — Progress Notes (Incomplete)
DUE TO COVID-19 ONLY ONE VISITOR IS ALLOWED TO COME WITH YOU AND STAY IN THE WAITING ROOM ONLY DURING PRE OP AND PROCEDURE DAY OF SURGERY.   PCP - *** Cardiologist - Dr. Percival Spanish  Chest x-ray - 07/15/2021 EKG - 11/29/2020 Stress Test - 11/29/2020 ECHO - 01/29/2018 EF 50-55% Cardiac Cath - NA  ICD Pacemaker/Loop - NA  Sleep Study -  *** CPAP - ***  Blood Thinner Instructions:  Follow your surgeon's instructions on when to stop prior to surgery.  Aspirin Instructions: Follow your surgeon's instructions on when to stop aspirin prior to surgery,  If no instructions were given by your surgeon then you will need to call the office for those instructions.  ERAS: ***  Anesthesia review: ***  STOP now taking any Aspirin (unless otherwise instructed by your surgeon), Aleve, Naproxen, Ibuprofen, Motrin, Advil, Goody's, BC's, all herbal medications, fish oil, and all vitamins.   Coronavirus Screening Covid test is scheduled on  Do you have any of the following symptoms:  Cough {yes/no:20286:::1} Fever (>100.23F)  {yes/no:20286:::1} Runny nose {yes/no:20286:::1} Sore throat {yes/no:20286:::1} Difficulty breathing/shortness of breath  {yes/no:20286:::1}  Have you traveled in the last 14 days and where? {yes/no:20286:::1}  Patient verbalized understanding of instructions that were given to them at the PAT appointment. Patient was also instructed that they will need to review over the PAT instructions again at home before surgery.

## 2021-11-01 ENCOUNTER — Encounter (HOSPITAL_COMMUNITY): Admission: RE | Payer: Self-pay | Source: Home / Self Care

## 2021-11-01 ENCOUNTER — Ambulatory Visit (HOSPITAL_COMMUNITY)
Admission: RE | Admit: 2021-11-01 | Payer: Medicaid Other | Source: Home / Self Care | Admitting: Obstetrics and Gynecology

## 2021-11-01 SURGERY — RADIOFREQUENCY ABLATION, LEIOMYOMA, UTERUS, TRANSCERVICAL APPROACH, WITH US GUIDANCE
Anesthesia: Choice

## 2021-11-03 ENCOUNTER — Other Ambulatory Visit: Payer: Self-pay

## 2021-11-03 ENCOUNTER — Encounter: Payer: Self-pay | Admitting: Internal Medicine

## 2021-11-03 ENCOUNTER — Ambulatory Visit (INDEPENDENT_AMBULATORY_CARE_PROVIDER_SITE_OTHER): Payer: Medicaid Other | Admitting: Internal Medicine

## 2021-11-03 VITALS — BP 120/80 | HR 93 | Temp 97.8°F | Ht 64.0 in | Wt 265.0 lb

## 2021-11-03 DIAGNOSIS — G4733 Obstructive sleep apnea (adult) (pediatric): Secondary | ICD-10-CM

## 2021-11-03 DIAGNOSIS — J4540 Moderate persistent asthma, uncomplicated: Secondary | ICD-10-CM

## 2021-11-03 MED ORDER — FLUTICASONE-SALMETEROL 250-50 MCG/ACT IN AEPB: 1.0000 | INHALATION_SPRAY | Freq: Two times a day (BID) | RESPIRATORY_TRACT | 3 refills | Status: DC

## 2021-11-03 NOTE — Patient Instructions (Addendum)
Please schedule follow up scheduled with myself in 1 months.  If my schedule is not open yet, we will contact you with a reminder closer to that time. Please call [phone] if you haven't heard from [place] a month before.   Before your next visit I would like you to have:  Full set of PFTs I am ordering breathing testing and a home sleep study. We will call you to schedule the sleep study.   Get saline gel to help with your nasal dryness. Apply gently with a q tip in your nose.   Stop taking flovent. Start taking advair again 1 puff in the morning, 1 puff at night.  Gargle after use.   Continue singulair.  By learning about asthma and how it can be controlled, you take an important step toward managing this disease. Work closely with your asthma care team to learn all you can about your asthma, how to avoid triggers, what your medications do, and how to take them correctly. With proper care, you can live free of asthma symptoms and maintain a normal, healthy lifestyle.   What is asthma? Asthma is a chronic disease that affects the airways of the lungs. During normal breathing, the bands of muscle that surround the airways are relaxed and air moves freely. During an asthma episode or "attack," there are three main changes that stop air from moving easily through the airways: The bands of muscle that surround the airways tighten and make the airways narrow. This tightening is called bronchospasm.  The lining of the airways becomes swollen or inflamed.  The cells that line the airways produce more mucus, which is thicker than normal and clogs the airways.  These three factors - bronchospasm, inflammation, and mucus production - cause symptoms such as difficulty breathing, wheezing, and coughing.  What are the most common symptoms of asthma? Asthma symptoms are not the same for everyone. They can even change from episode to episode in the same person. Also, you may have only one symptom of  asthma, such as cough, but another person may have all the symptoms of asthma. It is important to know all the symptoms of asthma and to be aware that your asthma can present in any of these ways at any time. The most common symptoms include: Coughing, especially at night  Shortness of breath  Wheezing  Chest tightness, pain, or pressure   Who is affected by asthma? Asthma affects 22 million Americans; about 6 million of these are children under age 77. People who have a family history of asthma have an increased risk of developing the disease. Asthma is also more common in people who have allergies or who are exposed to tobacco smoke. However, anyone can develop asthma at any time. Some people may have asthma all of their lives, while others may develop it as adults.  What causes asthma? The airways in a person with asthma are very sensitive and react to many things, or "triggers." Contact with these triggers causes asthma symptoms. One of the most important parts of asthma control is to identify your triggers and then avoid them when possible. The only trigger you do not want to avoid is exercise. Pre-treatment with medicines before exercise can allow you to stay active yet avoid asthma symptoms. Common asthma triggers include: Infections (colds, viruses, flu, sinus infections)  Exercise  Weather (changes in temperature and/or humidity, cold air)  Tobacco smoke  Allergens (dust mites, pollens, pets, mold spores, cockroaches, and sometimes foods)  Irritants (strong odors from cleaning products, perfume, wood smoke, air pollution)  Strong emotions such as crying or laughing hard  Some medications   How is asthma diagnosed? To diagnose asthma, your doctor will first review your medical history, family history, and symptoms. Your doctor will want to know any past history of breathing problems you may have had, as well as a family history of asthma, allergies, eczema (a bumpy, itchy skin rash  caused by allergies), or other lung disease. It is important that you describe your symptoms in detail (cough, wheeze, shortness of breath, chest tightness), including when and how often they occur. The doctor will perform a physical examination and listen to your heart and lungs. He or she may also order breathing tests, allergy tests, blood tests, and chest and sinus X-rays. The tests will find out if you do have asthma and if there are any other conditions that are contributing factors.  How is asthma treated? Asthma can be controlled, but not cured. It is not normal to have frequent symptoms, trouble sleeping, or trouble completing tasks. Appropriate asthma care will prevent symptoms and visits to the emergency room and hospital. Asthma medicines are one of the mainstays of asthma treatment. The drugs used to treat asthma are explained below.  Anti-inflammatories: These are the most important drugs for most people with asthma. Anti-inflammatory drugs reduce swelling and mucus production in the airways. As a result, airways are less sensitive and less likely to react to triggers. These medications need to be taken daily and may need to be taken for several weeks before they begin to control asthma. Anti-inflammatory medicines lead to fewer symptoms, better airflow, less sensitive airways, less airway damage, and fewer asthma attacks. If taken every day, they CONTROL or prevent asthma symptoms.   Bronchodilators: These drugs relax the muscle bands that tighten around the airways. This action opens the airways, letting more air in and out of the lungs and improving breathing. Bronchodilators also help clear mucus from the lungs. As the airways open, the mucus moves more freely and can be coughed out more easily. In short-acting forms, bronchodilators RELIEVE or stop asthma symptoms by quickly opening the airways and are very helpful during an asthma episode. In long-acting forms, bronchodilators provide  CONTROL of asthma symptoms and prevent asthma episodes.  Asthma drugs can be taken in a variety of ways. Inhaling the medications by using a metered dose inhaler, dry powder inhaler, or nebulizer is one way of taking asthma medicines. Oral medicines (pills or liquids you swallow) may also be prescribed.  Asthma severity Asthma is classified as either "intermittent" (comes and goes) or "persistent" (lasting). Persistent asthma is further described as being mild, moderate, or severe. The severity of asthma is based on how often you have symptoms both during the day and night, as well as by the results of lung function tests and by how well you can perform activities. The "severity" of asthma refers to how "intense" or "strong" your asthma is.  Asthma control Asthma control is the goal of asthma treatment. Regardless of your asthma severity, it may or may not be controlled. Asthma control means: You are able to do everything you want to do at work and home  You have no (or minimal) asthma symptoms  You do not wake up from your sleep or earlier than usual in the morning due to asthma  You rarely need to use your reliever medicine (inhaler)  Another major part of your treatment is that  you are happy with your asthma care and believe your asthma is controlled.  Monitoring symptoms A key part of treatment is keeping track of how well your lungs are working. Monitoring your symptoms  what they are, how and when they happen, and how severe they are  is an important part of being able to control your asthma.  Sometimes asthma is monitored using a peak flow meter. A peak flow (PF) meter measures how fast the air comes out of your lungs. It can help you know when your asthma is getting worse, sometimes even before you have symptoms. By taking daily peak flow readings, you can learn when to adjust medications to keep asthma under good control. It is also used to create your asthma action plan (see below).  Your doctor can use your peak flow readings to adjust your treatment plan in some cases.  Asthma Action Plan Based on your history and asthma severity, you and your doctor will develop a care plan called an asthma action plan. The asthma action plan describes when and how to use your medicines, actions to take when asthma worsens, and when to seek emergency care. Make sure you understand this plan. If you do not, ask your asthma care provider any questions you may have. Your asthma action plan is one of the keys to controlling asthma. Keep it readily available to remind you of what you need to do every day to control asthma and what you need to do when symptoms occur.  Goals of asthma therapy These are the goals of asthma treatment: Live an active, normal life  Prevent chronic and troublesome symptoms  Attend work or school every day  Perform daily activities without difficulty  Stop urgent visits to the doctor, emergency department, or hospital  Use and adjust medications to control asthma with few or no side effects

## 2021-11-03 NOTE — Progress Notes (Signed)
Nancy Dickerson    [id number]    07-21-1981  Primary Care Physician:King, Diona Foley, NP  Referring Physician: Vevelyn Francois, NP [address] [place],  [place] [address] Reason for Consultation: shortness of breath Date of Consultation: 11/03/2021  Chief complaint:   Chief Complaint  Patient presents with   Consult    She reports short of breath with exertion and she feels her inhalers are not helping.      HPI: Nancy Dickerson is a 41 y.o. woman who presents for new patient evaluation of dyspnea.  Symptoms started when she was a teenager, soon after pregnancy,   Hospitalized in April 2019 for weakness related to anemia.   She reports waking up choking in her sleep. She has had a Sleep apnea test a couple of years ago. Denies heart burn at night. She says this was done in [facility] [facility], but not at [facility].   She cannot lay flat to sleep, and sleeps tilted up.  Her asthma symptoms feel like choking with shortness of breath.   Has been on advair in the past and is currently on flovent. Has also been on stiolto.   Most of her care was done in [place] before she moved here in 2017.  She has six kids at home.   Flovent and albuterol do not always help her.   She felt the advair helped her more than flovent does.   She feels difficulty with exhalation and feels like she is choking.   Her son has laryngomalacia  Current Regimen: flovent 2 puffs once a day. Prn albuterol which she takes 3-4 times/week.  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  Smoking history: occasional  marijuana use. She has passive smoke exposure in childhood, but never smoked or vaped.   Social History   Occupational History   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  Tobacco Use   Smoking status: Never   Smokeless tobacco: Never  Vaping Use   Vaping Use: Never used  Substance and Sexual Activity   Alcohol use: Yes    Comment: rare   Drug use: Yes    Types: Marijuana   Sexual activity: Yes    Relevant family history:  Family History  Problem Relation Age of Onset   Sudden death Father 52   Asthma Sister    Asthma Son     Past Medical History:  Diagnosis Date   Anemia    Asthma    Bursitis of hip    Cardiomegaly    Chondromalacia of hip    Chondromalacia of right knee    COPD (chronic obstructive pulmonary disease) (HCC)    GERD (gastroesophageal reflux disease)    H/O transfusion of packed red blood cells    Tinnitus     Past Surgical History:  Procedure Laterality Date   CERVICAL CONIZATION W/BX N/A 06/25/2019   Procedure: CONIZATION CERVIX WITH BIOPSY - COLD KNIFE;  Surgeon: Emily Filbert, MD;  Location: [facility];  Service: Gynecology;  Laterality: N/A;   ENDOMETRIAL ABLATION N/A 06/25/2019   Procedure: MINERVA ABLATION;  Surgeon:  Emily Filbert, MD;  Location: [place];  Service: Gynecology;  Laterality: N/A;  Minerva rep will here confirmed on 06/20/19 CS   HYSTEROSCOPY WITH D & C  06/25/2019   Procedure: DILATATION AND CURETTAGE /HYSTEROSCOPY;  Surgeon: Emily Filbert, MD;  Location: [place];  Service: Gynecology;;   NO PAST SURGERIES       Physical Exam: Blood pressure 120/80, pulse 93, temperature 97.8 F (36.6 C), temperature source Oral, height 5\' 4"  (1.626 m), weight 265 lb (120.2 kg), SpO2 99 %. Gen:      No acute distress, obese ENT:  enlarged tonsils, crowded oral airway, no nasal polyps, mucus membranes moist Lungs:    No increased respiratory effort, symmetric chest wall excursion, clear to auscultation bilaterally,  no wheezes or crackles CV:         Regular rate and rhythm; no murmurs, rubs, or gallops.  No pedal edema Abd:      + bowel sounds; soft, non-tender; no distension MSK: no acute synovitis of DIP or PIP joints, no mechanics hands.  Skin:      Warm and dry; no rashes Neuro: normal speech, no focal facial asymmetry Psych: alert and oriented x3, normal mood and affect   Data Reviewed/Medical Decision Making:  Independent interpretation of tests: Imaging:  Review of patient's chest xray October 2022 images revealed healed left rib fracture, no acute pulmonary prcess. The patient's images have been independently reviewed by me.    PFTs: None on file No flowsheet data found.  Labs:  Lab Results  Component Value Date   WBC CANCELED 10/11/2021   HGB CANCELED 10/11/2021   HCT CANCELED 10/11/2021   MCV 78 (L) 10/11/2021   PLT CANCELED 10/11/2021   Lab Results  Component Value Date   NA 139 10/11/2021   K 4.4 10/11/2021   CL 102 10/11/2021   CO2 24 10/27/2020     Immunization status:  Immunization History  Administered Date(s) Administered   Tdap 12/13/2020     I reviewed prior external note(s) from ED, PCP urgent care  I reviewed the result(s) of the labs and imaging as noted above.   I have ordered spirometry and feno   Assessment:  Shortness of breath Severe Persistent Concern for OSA GERD   Plan/Recommendations: Ethelda has dyspnea and symptoms of shortness of breath consistent with severe persistent asthma. This is likely worsened by uncontrolled GERD - especially these nocturnal choking episodes she is having. May also be related to sleep apnea given her enlarged tonsils and reports of apnea. Will obtain Home sleep apnea test  Obtain full set of PFTs including FENO at next visit.   Stop taking flovent. Start taking advair 250-50 again 1 puff in the morning, 1 puff at night.  Continue singulair.  Continue PPI  We discussed disease management and progression at  length today.   Try saline gel in nares for nasal dryness.   Return to Care: Return in about 4 weeks (around 12/01/2021).  Lenice Llamas, MD Pulmonary and [place]  CC: Vevelyn Francois, NP

## 2021-11-25 ENCOUNTER — Telehealth: Payer: Self-pay

## 2021-11-25 ENCOUNTER — Other Ambulatory Visit: Payer: Self-pay | Admitting: Nurse Practitioner

## 2021-11-25 DIAGNOSIS — R21 Rash and other nonspecific skin eruption: Secondary | ICD-10-CM

## 2021-11-25 NOTE — Progress Notes (Signed)
   [facility] [address] [place], [place]  [address] Phone:  [phone]   Fax:  [phone] 

## 2021-11-25 NOTE — Telephone Encounter (Signed)
Noted, thanks!

## 2021-11-29 ENCOUNTER — Encounter: Payer: Self-pay | Admitting: Obstetrics and Gynecology

## 2021-11-29 ENCOUNTER — Encounter: Payer: Self-pay | Admitting: Obstetrics & Gynecology

## 2021-11-30 ENCOUNTER — Telehealth: Payer: Self-pay

## 2021-11-30 ENCOUNTER — Encounter: Payer: Self-pay | Admitting: Obstetrics & Gynecology

## 2021-11-30 ENCOUNTER — Other Ambulatory Visit: Payer: Medicaid Other | Admitting: Obstetrics and Gynecology

## 2021-11-30 MED ORDER — TINIDAZOLE 500 MG PO TABS: 2.0000 g | ORAL_TABLET | Freq: Once | ORAL | 0 refills | Status: DC

## 2021-11-30 NOTE — Telephone Encounter (Signed)
Pt request a call back via MyChart.  Called pt and pt stated that she was taking the Flagyl and it made her vomit.  I advised pt that I would send in medication for Trich Tindamax 2g once to see if that is better for her.  I advised pt to eat then wait about an hour to take medication.  I explained to the pt that the Tindamax is to treat the Trich and the doxcycline is to treat the Chlamydia.  Pt verbalized understanding with no further questions.   Mel Almond, RN  11/30/21

## 2021-12-01 ENCOUNTER — Telehealth: Payer: Self-pay

## 2021-12-01 NOTE — Telephone Encounter (Signed)
Received notification from pt's pharmacy that the Tindamax needs approval.  I was provided by the pharmacy the following PA auth # [id number].  Called # provided was advised that the PA will take about 24-48 hours for approval or denial and that we will receive a fax.  If pt is approved or denied then pt's pharmacy will be notified as well.  Reference # given [id number] for PA auth.    Frances Nickels  12/01/21

## 2021-12-12 ENCOUNTER — Encounter: Payer: Self-pay | Admitting: Internal Medicine

## 2021-12-12 ENCOUNTER — Ambulatory Visit (INDEPENDENT_AMBULATORY_CARE_PROVIDER_SITE_OTHER): Payer: Medicaid Other | Admitting: Internal Medicine

## 2021-12-12 ENCOUNTER — Other Ambulatory Visit: Payer: Self-pay

## 2021-12-12 VITALS — BP 118/78 | HR 109 | Temp 98.3°F | Ht 64.0 in | Wt 264.0 lb

## 2021-12-12 DIAGNOSIS — G4733 Obstructive sleep apnea (adult) (pediatric): Secondary | ICD-10-CM | POA: Diagnosis not present

## 2021-12-12 DIAGNOSIS — K219 Gastro-esophageal reflux disease without esophagitis: Secondary | ICD-10-CM

## 2021-12-12 DIAGNOSIS — J4540 Moderate persistent asthma, uncomplicated: Secondary | ICD-10-CM

## 2021-12-12 DIAGNOSIS — J383 Other diseases of vocal cords: Secondary | ICD-10-CM | POA: Diagnosis not present

## 2021-12-12 LAB — PULMONARY FUNCTION TEST
DL/VA % pred: 137 %
DL/VA: 6.11 ml/min/mmHg/L
DLCO cor % pred: 104 %
DLCO cor: 23.02 ml/min/mmHg
DLCO unc % pred: 104 %
DLCO unc: 23.02 ml/min/mmHg
FEF 25-75 Post: 4.13 L/sec
FEF 25-75 Pre: 1.14 L/sec
FEF2575-%Change-Post: 261 %
FEF2575-%Pred-Post: 144 %
FEF2575-%Pred-Pre: 39 %
FEV1-%Change-Post: 46 %
FEV1-%Pred-Post: 71 %
FEV1-%Pred-Pre: 48 %
FEV1-Post: 1.83 L
FEV1-Pre: 1.24 L
FEV1FVC-%Change-Post: 4 %
FEV1FVC-%Pred-Pre: 100 %
FEV6-%Change-Post: 40 %
FEV6-%Pred-Post: 68 %
FEV6-%Pred-Pre: 48 %
FEV6-Post: 2.07 L
FEV6-Pre: 1.48 L
FEV6FVC-%Pred-Post: 101 %
FEV6FVC-%Pred-Pre: 101 %
FVC-%Change-Post: 40 %
FVC-%Pred-Post: 66 %
FVC-%Pred-Pre: 47 %
FVC-Post: 2.07 L
FVC-Pre: 1.48 L
Post FEV1/FVC ratio: 88 %
Post FEV6/FVC ratio: 100 %
Pre FEV1/FVC ratio: 84 %
Pre FEV6/FVC Ratio: 100 %
RV % pred: -28 %
RV: -0.45 L
TLC % pred: 39 %
TLC: 1.98 L

## 2021-12-12 LAB — NITRIC OXIDE: Nitric Oxide: 22

## 2021-12-12 NOTE — Patient Instructions (Addendum)
Please schedule follow up scheduled with myself in 3 months.  If my schedule is not open yet, we will contact you with a reminder closer to that time. Please call [phone] if you haven't heard from [place] a month before.  ? ?Before your next visit I would like you to have: ?Sleep study - we will call you to schedule. Hopefully this will be soon.  ?I am referring you to ENT doctors at [facility] ? ?Increase prilosec to 40 mg twice a day until you see ENT.  ?

## 2021-12-12 NOTE — Progress Notes (Signed)
? ?      ?JUNICE FEI    [id number]    18-Mar-1981 ? ?Primary Care Physician:King, Diona Foley, NP ?Date of Appointment: 12/12/2021 ?Established Patient Visit ? ?Chief complaint:   ?Chief Complaint  ?Patient presents with  ? Follow-up  ?  PFT performed today.  Pt states she has been doing okay since last visit. States she will still get choked while talking and also at other times as well. Also states she is not sleeping.  ? ? ? ?HPI: ?Bitha DOROTHYANN MOURER is a 41 y.o. with asthma.  ? ?Interval Updates: ?Here for follow up after PFTs ?We switched advair at last visit.  ?No improvement in her symptoms. Albuterol and advair are not helping.  ?She starts having symptoms of "choking" when she is stressed, upset, screaming at her kids.  ?She is still waking up choking 4-5 times/night. Sleep study is pending.  ? ?  ?Current Regimen: advair 2 puffs once a day. Prn albuterol which she takes 3-4 times/week.  ?Asthma Triggers: strong smells like air fresheners, dust, cleaning products ?Exacerbations in the last year: ?History of hospitalization or intubation: ?Allergy Testing: never had ?GERD: yes on omeprazole ?Allergic Rhinitis: denies ?ACT:  ?Asthma Control Test ACT Total Score  ?12/12/2021 8  ?11/03/2021 13  ? ?FeNO: 22 ppb ? ?I have reviewed the patient's family social and past medical history and updated as appropriate.  ? ?Past Medical History:  ?Diagnosis Date  ? Anemia   ? Asthma   ? Bursitis of hip   ? Cardiomegaly   ? Chondromalacia of hip   ? Chondromalacia of right knee   ? COPD (chronic obstructive pulmonary disease) ([facility])   ? GERD (gastroesophageal reflux disease)   ? H/O transfusion of packed red blood cells   ? Tinnitus   ? ? ?Past Surgical History:  ?Procedure Laterality Date  ? CERVICAL CONIZATION W/BX N/A 06/25/2019  ? Procedure: CONIZATION CERVIX WITH BIOPSY - COLD KNIFE;  Surgeon: Emily Filbert, MD;  Location: [facility];  Service: Gynecology;  Laterality: N/A;  ? ENDOMETRIAL ABLATION N/A  06/25/2019  ? Procedure: MINERVA ABLATION;  Surgeon: Emily Filbert, MD;  Location: [facility];  Service: Gynecology;  Laterality: N/A;  Minerva rep will here confirmed on 06/20/19 CS  ? HYSTEROSCOPY WITH D & C  06/25/2019  ? Procedure: DILATATION AND CURETTAGE /HYSTEROSCOPY;  Surgeon: Emily Filbert, MD;  Location: [facility];  Service: Gynecology;;  ? NO PAST SURGERIES    ? ? ?Family History  ?Problem Relation Age of Onset  ? Sudden death Father 60  ? Asthma Sister   ? Asthma Son   ? ? ?Social History  ? ?Occupational History  ? 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  ?Tobacco Use  ? Smoking status: Never  ? Smokeless tobacco: Never  ?Vaping Use  ? Vaping Use: Never used  ?Substance and Sexual Activity  ? Alcohol use: Yes  ?  Comment: rare  ? Drug use: Yes  ?  Types: Marijuana  ? Sexual activity: Yes  ? ? ? ?Physical Exam: ?Blood pressure 118/78, pulse (!) 109, temperature 98.3 ?F (36.8 ?C), temperature source Oral, height '5\' 4"'$  (1.626 m), weight 264 lb (119.7 kg), SpO2 100 %. ? ?Gen:      No acute distress ?ENT:  no nasal polyps, mucus membranes moist ?Lungs:    No increased respiratory effort, symmetric chest wall excursion, clear to auscultation bilaterally, no wheezes or crackles ?CV:  Regular rate and rhythm; no murmurs, rubs, or gallops.  No pedal edema ? ? ?Data Reviewed: ?Imaging: ?I have personally reviewed the October 2022 chest xray no acute process.  ? ?PFTs: ? ?PFT Results Latest Ref Rng & Units 12/12/2021  ?FVC-Pre L 1.48  ?FVC-Predicted Pre % 47  ?FVC-Post L 2.07  ?FVC-Predicted Post % 66  ?Pre FEV1/FVC % % 84  ?Post FEV1/FCV % % 88  ?FEV1-Pre L 1.24  ?FEV1-Predicted Pre % 48  ?FEV1-Post L 1.83  ?DLCO uncorrected ml/min/mmHg 23.02  ?DLCO UNC% % 104  ?DLCO corrected ml/min/mmHg 23.02  ?DLCO COR %Predicted % 104  ?DLVA Predicted % 137  ?TLC L 1.98  ?TLC % Predicted % 39  ?RV % Predicted % -28  ? ?I have personally reviewed the patient's PFTs and no airflow limitation. Severe restriction to  ventilation. Normal diffusion capacity, normal flow volume loop.  ? ?Labs: ?Lab Results  ?Component Value Date  ? WBC CANCELED 10/11/2021  ? HGB CANCELED 10/11/2021  ? HCT CANCELED 10/11/2021  ? MCV 78 (L) 10/11/2021  ? PLT CANCELED 10/11/2021  ? ?Lab Results  ?Component Value Date  ? NA 139 10/11/2021  ? K 4.4 10/11/2021  ? CL 102 10/11/2021  ? CO2 24 10/27/2020  ? ? ? ?Immunization status: ?Immunization History  ?Administered Date(s) Administered  ? Tdap 12/13/2020  ? ? ?External Records Personally Reviewed: ENT ? 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 ?Plan/Recommendations: ?Symptoms not improving with asthma management. I think her "choking" episodes could be a laryngeal disorder instead.  ?Continue advair, prn albuterol ?Increase PPI to twice daily given her previously documented LPR.  ?Refer to ENT laryngology Dr. Joya Gaskins or madden at [facility].  ?Awaiting sleep study  ? ? ?Return to Care: ?Return in about 3 months (around 03/14/2022). ? ? ?Lenice Llamas, MD ?Pulmonary and Critical Care Medicine ?[facility] ?[phone] ? ? ? ? ? ?

## 2021-12-12 NOTE — Progress Notes (Signed)
Full PFT performed today. °

## 2021-12-12 NOTE — Patient Instructions (Signed)
Full PFT performed today. °

## 2021-12-29 ENCOUNTER — Other Ambulatory Visit (HOSPITAL_COMMUNITY): Payer: Self-pay | Admitting: Psychiatry

## 2021-12-29 ENCOUNTER — Encounter (HOSPITAL_COMMUNITY): Payer: Self-pay

## 2021-12-29 ENCOUNTER — Telehealth (HOSPITAL_COMMUNITY): Payer: Medicaid Other | Admitting: Psychiatry

## 2021-12-29 NOTE — Progress Notes (Signed)
Contacted patient virtually for her scheduled appointment today. Patient stated she was unable to get off from work and will call back to reschedule today's appointment. ?

## 2022-01-05 ENCOUNTER — Telehealth (HOSPITAL_COMMUNITY): Payer: Medicaid Other | Admitting: Psychiatry

## 2022-01-05 ENCOUNTER — Encounter (HOSPITAL_COMMUNITY): Payer: Self-pay

## 2022-01-11 ENCOUNTER — Ambulatory Visit: Payer: Self-pay

## 2022-01-16 ENCOUNTER — Ambulatory Visit
Admission: EM | Admit: 2022-01-16 | Discharge: 2022-01-16 | Disposition: A | Discharge status: Home / Self Care | Payer: Medicaid Other | Attending: Physician Assistant | Admitting: Physician Assistant

## 2022-01-16 DIAGNOSIS — M546 Pain in thoracic spine: Secondary | ICD-10-CM

## 2022-01-16 MED ORDER — VALACYCLOVIR HCL 1 G PO TABS: 1000.0000 mg | ORAL_TABLET | Freq: Three times a day (TID) | ORAL | 0 refills | Status: AC

## 2022-01-16 MED ORDER — CYCLOBENZAPRINE HCL 10 MG PO TABS: 10.0000 mg | ORAL_TABLET | Freq: Two times a day (BID) | ORAL | 0 refills | Status: DC | PRN

## 2022-01-16 MED ORDER — PREDNISONE 20 MG PO TABS: 40.0000 mg | ORAL_TABLET | Freq: Every day | ORAL | 0 refills | Status: AC

## 2022-01-16 NOTE — ED Provider Notes (Signed)
?[place] ? ? ? ?CSN: [id number] ?Arrival date & time: 01/16/22  1113 ? ? ?  ? ?History   ?Chief Complaint ?Chief Complaint  ?Patient presents with  ? Back Pain  ? ? ?HPI ?Nancy SORAYAH Dickerson is a 41 y.o. female.  ? ?Patient here today for evaluation of left sided thoracic back pain and rib pain that started yesterday. She reports there is pain with movement, breathing, and tenderness to touch. She has moved from the table to a chair because she reports that sitting back in the table worsens pain. She denies any falls or injuries. She has been taking OTC meds, gabapentin without relief.  ? ?The history is provided by the patient.  ?Back Pain ?Associated symptoms: no abdominal pain and no fever   ? ?Past Medical History:  ?Diagnosis Date  ? Anemia   ? Asthma   ? Bursitis of hip   ? Cardiomegaly   ? Chondromalacia of hip   ? Chondromalacia of right knee   ? COPD (chronic obstructive pulmonary disease) ([place])   ? GERD (gastroesophageal reflux disease)   ? H/O transfusion of packed red blood cells   ? Tinnitus   ? ? ?Patient Active Problem List  ? Diagnosis Date Noted  ? 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 Chest congestion 07/15/2021  ? Acute cough 07/15/2021  ? Tachycardia 06/01/2021  ? SOB (shortness of breath) 06/01/2021  ? Attention deficit hyperactivity disorder (ADHD), combined type 01/13/2021  ? Chronic obstructive pulmonary disease ([place]) 11/26/2020  ? Moderate episode of recurrent major depressive disorder ([place]) 10/14/2020  ? Generalized anxiety disorder 10/14/2020  ? Dysphonia 08/25/2020  ? Laryngopharyngeal reflux (LPR) 08/25/2020  ? Pulsatile tinnitus of right ear 08/25/2020  ? Dysfunctional uterine bleeding 06/03/2020  ? Patellofemoral pain syndrome of left knee 11/08/2019  ? Acute pain of left knee 08/13/2019  ? Insomnia 07/07/2019  ? Lumbar radicular pain 07/07/2019  ? Uterine leiomyoma 03/12/2018  ? Trochanteric bursitis of right hip 03/12/2018  ? Class 3 severe obesity due to  excess calories without serious comorbidity with body mass index (BMI) of 40.0 to 44.9 in adult [facility]) 03/12/2018  ? Iron deficiency anemia due to chronic blood loss 01/29/2018  ? Right sided weakness 01/29/2018  ? ? ?Past Surgical History:  ?Procedure Laterality Date  ? CERVICAL CONIZATION W/BX N/A 06/25/2019  ? Procedure: CONIZATION CERVIX WITH BIOPSY - COLD KNIFE;  Surgeon: Emily Filbert, MD;  Location: [place];  Service: Gynecology;  Laterality: N/A;  ? ENDOMETRIAL ABLATION N/A 06/25/2019  ? Procedure: MINERVA ABLATION;  Surgeon: Emily Filbert, MD;  Location: [place];  Service: Gynecology;  Laterality: N/A;  Minerva rep will here confirmed on 06/20/19 CS  ? HYSTEROSCOPY WITH D & C  06/25/2019  ? Procedure: DILATATION AND CURETTAGE /HYSTEROSCOPY;  Surgeon: Emily Filbert, MD;  Location: [place];  Service: Gynecology;;  ? NO PAST SURGERIES    ? ? ?OB History   ? ? Gravida  ?7  ? Para  ?1  ? Term  ?1  ? Preterm  ?   ? AB  ?1  ? Living  ?6  ?  ? ? SAB  ?1  ? IAB  ?   ? Ectopic  ?   ? Multiple  ?   ? Live Births  ?   ?   ?  ?  ? ? ? ?Home Medications   ? ?Prior to Admission  medications   ?Medication Sig Start Date End Date Taking? Authorizing Provider  ?cyclobenzaprine (FLEXERIL) 10 MG tablet Take 1 tablet (10 mg total) by mouth 2 (two) times daily as needed for muscle spasms. 01/16/22  Yes Francene Finders, PA-C  ?predniSONE (DELTASONE) 20 MG tablet Take 2 tablets (40 mg total) by mouth daily with breakfast for 5 days. 01/16/22 01/21/22 Yes Francene Finders, PA-C  ?valACYclovir (VALTREX) 1000 MG tablet Take 1 tablet (1,000 mg total) by mouth 3 (three) times daily for 7 days. 01/16/22 01/23/22 Yes Francene Finders, PA-C  ?albuterol (PROVENTIL) (2.5 MG/3ML) 0.083% nebulizer solution 2.5 mg every 6 (six) hours as needed. ?Patient taking differently: Take 2.5 mg by nebulization every 6 (six) hours as needed for wheezing or shortness of breath. 2.5 mg every 6 (six) hours as  needed. 07/15/21   Fenton Foy, NP  ?albuterol (VENTOLIN HFA) 108 (90 Base) MCG/ACT inhaler Inhale 2 puffs into the lungs every 6 (six) hours as needed for wheezing or shortness of breath. 09/08/21   Vevelyn Francois, NP  ?diclofenac Sodium (VOLTAREN) 1 % GEL Apply 4 g topically 4 (four) times daily. 10/24/21   Elba Barman, DO  ?DULoxetine HCl 40 MG CPEP Take 1 tablet by mouth daily. 10/20/21   Salley Slaughter, NP  ?fluticasone-salmeterol (ADVAIR DISKUS) 250-50 MCG/ACT AEPB Inhale 1 puff into the lungs in the morning and at bedtime. 11/03/21   Spero Geralds, MD  ?gabapentin (NEURONTIN) 300 MG capsule Take 1 capsule (300 mg total) by mouth 3 (three) times daily. 10/20/21   Salley Slaughter, NP  ?metoprolol tartrate (LOPRESSOR) 25 MG tablet Take 25 mg by mouth 2 (two) times daily.    [provider]  ?montelukast (SINGULAIR) 10 MG tablet Take 1 tablet (10 mg total) by mouth at bedtime. 07/15/21   Fenton Foy, NP  ?Multiple Vitamin (MULTI-VITAMIN) tablet Take 2 tablets by mouth daily. Gummies    [provider]  ?omeprazole (PRILOSEC) 20 MG capsule Take 1 capsule (20 mg total) by mouth daily. 07/15/21   Fenton Foy, NP  ?tinidazole (TINDAMAX) 500 MG tablet TAKE 4 TABLETS BY MOUTH AS ONE DOSE 12/08/21   Griffin Basil, MD  ?zolpidem (AMBIEN) 5 MG tablet Take 1 tablet (5 mg total) by mouth at bedtime as needed for sleep. 10/20/21   Salley Slaughter, NP  ? ? ?Family History ?Family History  ?Problem Relation Age of Onset  ? Sudden death Father 57  ? Asthma Sister   ? Asthma Son   ? ? ?Social History ?Social History  ? ?Tobacco Use  ? Smoking status: Never  ? Smokeless tobacco: Never  ?Vaping Use  ? Vaping Use: Never used  ?Substance Use Topics  ? Alcohol use: Yes  ?  Comment: rare  ? Drug use: Yes  ?  Types: Marijuana  ? ? ? ?Allergies   ?Patient has no known allergies. ? ? ?Review of Systems ?Review of Systems  ?Constitutional:  Negative for chills and fever.  ?Eyes:  Negative for  discharge and redness.  ?Gastrointestinal:  Negative for abdominal pain, nausea and vomiting.  ?Musculoskeletal:  Positive for back pain and myalgias.  ?Skin:  Negative for color change and rash.  ? ? ?Physical Exam ?Triage Vital Signs ?ED Triage Vitals  ?Enc Vitals Group  ?   BP 01/16/22 1202 137/88  ?   Pulse Rate 01/16/22 1202 84  ?   Resp 01/16/22 1202 18  ?   Temp  01/16/22 1202 97.8 ?F (36.6 ?C)  ?   Temp Source 01/16/22 1202 Oral  ?   SpO2 01/16/22 1202 98 %  ?   Weight --   ?   Height --   ?   Head Circumference --   ?   Peak Flow --   ?   Pain Score 01/16/22 1205 7  ?   Pain Loc --   ?   Pain Edu? --   ?   Excl. in [place]? --   ? ?No data found. ? ?Updated Vital Signs ?BP 137/88 (BP Location: Left Arm)   Pulse 84   Temp 97.8 ?F (36.6 ?C) (Oral)   Resp 18   SpO2 98%  ? ?Physical Exam ?Vitals and nursing note reviewed.  ?Constitutional:   ?   General: She is not in acute distress. ?   Appearance: Normal appearance. She is not ill-appearing.  ?HENT:  ?   Head: Normocephalic and atraumatic.  ?Eyes:  ?   Conjunctiva/sclera: Conjunctivae normal.  ?Cardiovascular:  ?   Rate and Rhythm: Normal rate.  ?Pulmonary:  ?   Effort: Pulmonary effort is normal.  ?Musculoskeletal:  ?   Comments: TTP diffusely from left middle rib posterior to anterior  ?Neurological:  ?   Mental Status: She is alert.  ?Psychiatric:     ?   Mood and Affect: Mood normal.     ?   Behavior: Behavior normal.     ?   Thought Content: Thought content normal.  ? ? ? ?UC Treatments / Results  ?Labs ?(all labs ordered are listed, but only abnormal results are displayed) ?Labs Reviewed - No data to display ? ?EKG ? ? ?Radiology ?No results found. ? ?Procedures ?Procedures (including critical care time) ? ?Medications Ordered in UC ?Medications - No data to display ? ?Initial Impression / Assessment and Plan / UC Course  ?I have reviewed the triage vital signs and the nursing notes. ? ?Pertinent labs & imaging results that were available during my care  of the patient were reviewed by me and considered in my medical decision making (see chart for details). ? ?  ?Prednisone and muscle relaxer prescribed to cover muscular strain. Discussed possibility of shingles as well

## 2022-01-16 NOTE — ED Triage Notes (Signed)
Onset yesterday of thoracic back pain and left sided rib pain. Pt reports sxs starting while she lying down. Rib area is tender to the touch and warm. ?Taking deep breaths and lifting her arm aggravates sxs. No fall or injuries. Has tried otc meds, gabapentin and massages w/o relief. ?

## 2022-01-18 ENCOUNTER — Ambulatory Visit: Payer: Medicaid Other

## 2022-01-18 DIAGNOSIS — G4733 Obstructive sleep apnea (adult) (pediatric): Secondary | ICD-10-CM

## 2022-01-27 ENCOUNTER — Ambulatory Visit: Payer: Medicaid Other

## 2022-01-27 DIAGNOSIS — G4733 Obstructive sleep apnea (adult) (pediatric): Secondary | ICD-10-CM | POA: Diagnosis not present

## 2022-01-31 ENCOUNTER — Encounter (HOSPITAL_BASED_OUTPATIENT_CLINIC_OR_DEPARTMENT_OTHER): Payer: Self-pay

## 2022-01-31 ENCOUNTER — Emergency Department (HOSPITAL_BASED_OUTPATIENT_CLINIC_OR_DEPARTMENT_OTHER): Payer: Medicaid Other

## 2022-01-31 ENCOUNTER — Other Ambulatory Visit: Payer: Self-pay

## 2022-01-31 ENCOUNTER — Emergency Department (HOSPITAL_BASED_OUTPATIENT_CLINIC_OR_DEPARTMENT_OTHER)
Admission: EM | Admit: 2022-01-31 | Discharge: 2022-01-31 | Disposition: A | Discharge status: Home / Self Care | Payer: Medicaid Other | Attending: Emergency Medicine | Admitting: Emergency Medicine

## 2022-01-31 DIAGNOSIS — R519 Headache, unspecified: Secondary | ICD-10-CM | POA: Insufficient documentation

## 2022-01-31 DIAGNOSIS — J449 Chronic obstructive pulmonary disease, unspecified: Secondary | ICD-10-CM | POA: Diagnosis not present

## 2022-01-31 DIAGNOSIS — M546 Pain in thoracic spine: Secondary | ICD-10-CM | POA: Diagnosis not present

## 2022-01-31 DIAGNOSIS — M542 Cervicalgia: Secondary | ICD-10-CM | POA: Insufficient documentation

## 2022-01-31 DIAGNOSIS — R55 Syncope and collapse: Secondary | ICD-10-CM | POA: Diagnosis not present

## 2022-01-31 DIAGNOSIS — Z79899 Other long term (current) drug therapy: Secondary | ICD-10-CM | POA: Diagnosis not present

## 2022-01-31 DIAGNOSIS — G4489 Other headache syndrome: Secondary | ICD-10-CM | POA: Diagnosis not present

## 2022-01-31 DIAGNOSIS — D649 Anemia, unspecified: Secondary | ICD-10-CM | POA: Insufficient documentation

## 2022-01-31 DIAGNOSIS — N9489 Other specified conditions associated with female genital organs and menstrual cycle: Secondary | ICD-10-CM | POA: Diagnosis not present

## 2022-01-31 DIAGNOSIS — Z7951 Long term (current) use of inhaled steroids: Secondary | ICD-10-CM | POA: Diagnosis not present

## 2022-01-31 LAB — URINALYSIS, ROUTINE W REFLEX MICROSCOPIC
Bilirubin Urine: NEGATIVE
Glucose, UA: NEGATIVE mg/dL
Hgb urine dipstick: NEGATIVE
Ketones, ur: NEGATIVE mg/dL
Leukocytes,Ua: NEGATIVE
Nitrite: NEGATIVE
Protein, ur: NEGATIVE mg/dL
Specific Gravity, Urine: 1.022 (ref 1.005–1.030)
pH: 6 (ref 5.0–8.0)

## 2022-01-31 LAB — CBC
HCT: 31.5 % — ABNORMAL LOW (ref 36.0–46.0)
Hemoglobin: 9.4 g/dL — ABNORMAL LOW (ref 12.0–15.0)
MCH: 24.5 pg — ABNORMAL LOW (ref 26.0–34.0)
MCHC: 29.8 g/dL — ABNORMAL LOW (ref 30.0–36.0)
MCV: 82.2 fL (ref 80.0–100.0)
Platelets: 415 10*3/uL — ABNORMAL HIGH (ref 150–400)
RBC: 3.83 MIL/uL — ABNORMAL LOW (ref 3.87–5.11)
RDW: 17.6 % — ABNORMAL HIGH (ref 11.5–15.5)
WBC: 6.9 10*3/uL (ref 4.0–10.5)
nRBC: 0 % (ref 0.0–0.2)

## 2022-01-31 LAB — BASIC METABOLIC PANEL
Anion gap: 10 (ref 5–15)
BUN: 8 mg/dL (ref 6–20)
CO2: 26 mmol/L (ref 22–32)
Calcium: 9.8 mg/dL (ref 8.9–10.3)
Chloride: 101 mmol/L (ref 98–111)
Creatinine, Ser: 0.67 mg/dL (ref 0.44–1.00)
GFR, Estimated: >60 mL/min (ref >60)
Glucose, Bld: 84 mg/dL (ref 70–99)
Potassium: 3.4 mmol/L — ABNORMAL LOW (ref 3.5–5.1)
Sodium: 137 mmol/L (ref 135–145)

## 2022-01-31 LAB — HCG, SERUM, QUALITATIVE: Preg, Serum: NEGATIVE

## 2022-01-31 LAB — CBG MONITORING, ED: Glucose-Capillary: 85 mg/dL (ref 70–99)

## 2022-01-31 MED ORDER — IOHEXOL 350 MG/ML SOLN
100.0000 mL | Freq: Once | INTRAVENOUS | Status: AC | PRN
  Administered 2022-01-31: 100 mL via INTRAVENOUS

## 2022-01-31 MED ORDER — MECLIZINE HCL 25 MG PO TABS
50.0000 mg | ORAL_TABLET | Freq: Once | ORAL | Status: AC
Start: 2022-01-31 — End: 2022-01-31
  Administered 2022-01-31: 50 mg via ORAL
  Filled 2022-01-31: qty 2

## 2022-01-31 NOTE — ED Provider Notes (Signed)
?[place] EMERGENCY DEPT ?Provider Note ? ? ?CSN: [id number] ?Arrival date & time: 01/31/22  1509 ? ?  ? ?History ? ?Chief Complaint  ?Patient presents with  ? Near Syncope  ? ? ?Nancy Dickerson is a 41 y.o. female. ? ?HPI ? ? 41 yo F with pmhx GERD, COPD, anemia presents with dizziness and syncope. States since yesterday morning she has been having posterior neck pain on the right side. Today has been having a posterior headache. While on lunch break started to feel lightheaded and "tingling all over". Called her son. Said she was going to pass out. He caught her and lowered her to the ground. Does not think she had full LOC. No seizure like activity. No neuro deficit. No CP, SOB, palpations. Currently feels fatigued, but continues to have mild posterior headache. ? ?Home Medications ?Prior to Admission medications   ?Medication Sig Start Date End Date Taking? Authorizing Provider  ?albuterol (PROVENTIL) (2.5 MG/3ML) 0.083% nebulizer solution 2.5 mg every 6 (six) hours as needed. ?Patient taking differently: Take 2.5 mg by nebulization every 6 (six) hours as needed for wheezing or shortness of breath. 2.5 mg every 6 (six) hours as needed. 07/15/21   Fenton Foy, NP  ?albuterol (VENTOLIN HFA) 108 (90 Base) MCG/ACT inhaler Inhale 2 puffs into the lungs every 6 (six) hours as needed for wheezing or shortness of breath. 09/08/21   Vevelyn Francois, NP  ?cyclobenzaprine (FLEXERIL) 10 MG tablet Take 1 tablet (10 mg total) by mouth 2 (two) times daily as needed for muscle spasms. 01/16/22   Francene Finders, PA-C  ?diclofenac Sodium (VOLTAREN) 1 % GEL Apply 4 g topically 4 (four) times daily. 10/24/21   Elba Barman, DO  ?DULoxetine HCl 40 MG CPEP Take 1 tablet by mouth daily. 10/20/21   Salley Slaughter, NP  ?fluticasone-salmeterol (ADVAIR DISKUS) 250-50 MCG/ACT AEPB Inhale 1 puff into the lungs in the morning and at bedtime. 11/03/21   Spero Geralds, MD  ?gabapentin (NEURONTIN) 300 MG capsule Take 1  capsule (300 mg total) by mouth 3 (three) times daily. 10/20/21   Salley Slaughter, NP  ?metoprolol tartrate (LOPRESSOR) 25 MG tablet Take 25 mg by mouth 2 (two) times daily.    [provider]  ?montelukast (SINGULAIR) 10 MG tablet Take 1 tablet (10 mg total) by mouth at bedtime. 07/15/21   Fenton Foy, NP  ?Multiple Vitamin (MULTI-VITAMIN) tablet Take 2 tablets by mouth daily. Gummies    [provider]  ?omeprazole (PRILOSEC) 20 MG capsule Take 1 capsule (20 mg total) by mouth daily. 07/15/21   Fenton Foy, NP  ?tinidazole (TINDAMAX) 500 MG tablet TAKE 4 TABLETS BY MOUTH AS ONE DOSE 12/08/21   Griffin Basil, MD  ?zolpidem (AMBIEN) 5 MG tablet Take 1 tablet (5 mg total) by mouth at bedtime as needed for sleep. 10/20/21   Salley Slaughter, NP  ?   ? ?Allergies    ?Patient has no known allergies.   ? ?Review of Systems   ?Review of Systems  ?Constitutional:  Negative for fever.  ?Respiratory:  Negative for shortness of breath.   ?Cardiovascular:  Negative for chest pain.  ?Gastrointestinal:  Negative for abdominal pain, diarrhea and vomiting.  ?Skin:  Negative for rash.  ?Neurological:  Positive for syncope, light-headedness and headaches. Negative for tremors, seizures, facial asymmetry, speech difficulty, weakness and numbness.  ? ?Physical Exam ?Updated Vital Signs ?BP (!) 135/91   Pulse 74   Temp  98.3 ?F (36.8 ?C) (Oral)   Resp 18   Ht '5\' 4"'$  (3.536 m)   Wt 119.7 kg   SpO2 96%   BMI 45.30 kg/m?  ?Physical Exam ?Vitals and nursing note reviewed.  ?Constitutional:   ?   General: She is not in acute distress. ?   Appearance: Normal appearance. She is not diaphoretic.  ?HENT:  ?   Head: Normocephalic.  ?   Mouth/Throat:  ?   Mouth: Mucous membranes are moist.  ?Eyes:  ?   Extraocular Movements: Extraocular movements intact.  ?   Pupils: Pupils are equal, round, and reactive to light.  ?Cardiovascular:  ?   Rate and Rhythm: Normal rate.  ?Pulmonary:  ?   Effort: Pulmonary effort  is normal. No respiratory distress.  ?Abdominal:  ?   Palpations: Abdomen is soft.  ?   Tenderness: There is no abdominal tenderness.  ?Musculoskeletal:  ?   Cervical back: No rigidity or tenderness.  ?Skin: ?   General: Skin is warm.  ?Neurological:  ?   General: No focal deficit present.  ?   Mental Status: She is alert and oriented to person, place, and time. Mental status is at baseline.  ?   Cranial Nerves: No cranial nerve deficit.  ?Psychiatric:     ?   Mood and Affect: Mood normal.  ? ? ?ED Results / Procedures / Treatments   ?Labs ?(all labs ordered are listed, but only abnormal results are displayed) ?Labs Reviewed  ?BASIC METABOLIC PANEL - Abnormal; Notable for the following components:  ?    Result Value  ? Potassium 3.4 (*)   ? All other components within normal limits  ?CBC - Abnormal; Notable for the following components:  ? RBC 3.83 (*)   ? Hemoglobin 9.4 (*)   ? HCT 31.5 (*)   ? MCH 24.5 (*)   ? MCHC 29.8 (*)   ? RDW 17.6 (*)   ? Platelets 415 (*)   ? All other components within normal limits  ?HCG, SERUM, QUALITATIVE  ?URINALYSIS, ROUTINE W REFLEX MICROSCOPIC  ?CBG MONITORING, ED  ? ? ?EKG ?EKG Interpretation ? ?Date/Time:  Tuesday January 31 2022 15:31:31 EDT ?Ventricular Rate:  70 ?PR Interval:  162 ?QRS Duration: 101 ?QT Interval:  401 ?QTC Calculation: 433 ?R Axis:   38 ?Text Interpretation: Sinus rhythm Borderline T abnormalities, anterior leads Confirmed by Lavenia Atlas [phone]) on 01/31/2022 3:41:23 PM ? ?Radiology ?No results found. ? ?Procedures ?Procedures  ? ? ?Medications Ordered in ED ?Medications - No data to display ? ?ED Course/ Medical Decision Making/ A&P ?  ?                        ?Medical Decision Making ?Amount and/or Complexity of Data Reviewed ?Labs: ordered. ?Radiology: ordered. ? ?Risk ?Prescription drug management. ? ? ?41 year old female presents emergency department with near syncope episode associated with neck pain and headache in the last day.  She is currently neuro  intact, vitals are stable.  EKG is normal sinus rhythm.  No seizure-like activity.  No acute neurodeficit.  No chest pain, shortness of breath.  Low suspicion for ACS/PE. ? ?Given the neck and head pain in relation to syncope CTA head and neck was done.  This did show an empty sella which is chronic and unchanged with the patient but no other vascular abnormality.  Blood work is baseline including anemia.  Pregnancy test is negative, urinalysis is unremarkable, orthostatic vitals  were normal.  Plan for outpatient follow-up, she is back to baseline.  Patient at this time appears safe and stable for discharge and close outpatient follow up. Discharge plan and strict return to ED precautions discussed, patient verbalizes understanding and agreement. ? ? ? ? ? ? ? ?Final Clinical Impression(s) / ED Diagnoses ?Final diagnoses:  ?None  ? ? ?Rx / DC Orders ?ED Discharge Orders   ? ? None  ? ?  ? ? ?  ?Lorelle Gibbs, DO ?01/31/22 2322 ? ?

## 2022-01-31 NOTE — Discharge Instructions (Signed)
You have been seen and discharged from the emergency department.  Your blood work showed an anemia which is normal for you.  The CTA of your head and neck showed no acute/new finding.  Your heart work-up was normal.  Follow-up with your primary provider and neurologist for further evaluation and further care.  Stay well-hydrated and take home medications as prescribed. If you have any worsening symptoms or further concerns for your health please return to an emergency department for further evaluation. ?

## 2022-01-31 NOTE — ED Notes (Signed)
Pt verbalizes understanding of discharge instructions. Opportunity for questioning and answers were provided. Pt discharged from ED to home. Pt appeared to be frustrated at time of discharge requesting to speak with the charge nurse over incident that occurred prior to start of this RN's shift. Charge nurse notified. Pt content upon discharge.    ? ?

## 2022-01-31 NOTE — ED Notes (Signed)
States she had a syncopal episode earlier today.  C/O head and neck pain. Denies N/V/D ?VSS NAD ?

## 2022-01-31 NOTE — ED Triage Notes (Signed)
Patient here POV from Home. ? ?Endorses taking a Lunch Break when she had near Complete Syncopal Episode while Standing. Patient was caught by Son prior to falling.  ? ?Patient endorses Neck Pain 2 days Prior to falling associated with a Headache today. ? ?No Fevers. Mild Nausea. No Emesis. No Diarrhea. ?

## 2022-02-01 ENCOUNTER — Telehealth: Payer: Self-pay

## 2022-02-01 NOTE — Telephone Encounter (Signed)
Transition Care Management Follow-up Telephone Call ?Date of discharge and from where: 01/31/2022 from [facility] ?How have you been since you were released from the hospital? Patient stated that she is feeling some better. Patient did not have any questions or concerns at this time.  ?Any questions or concerns? No ? ?Items Reviewed: ?Did the pt receive and understand the discharge instructions provided? Yes  ?Medications obtained and verified? Yes  ?Other? No  ?Any new allergies since your discharge? No  ?Dietary orders reviewed? No ?Do you have support at home? Yes  ? ?Functional Questionnaire: (I = Independent and D = Dependent) ?ADLs: I ? ?Bathing/Dressing- I ? ?Meal Prep- I ? ?Eating- I ? ?Maintaining continence- I ? ?Transferring/Ambulation- I ? ?Managing Meds- I ? ? ?Follow up appointments reviewed: ? ?PCP Hospital f/u appt confirmed? No  ?Specialist Hospital f/u appt confirmed? No   ?Are transportation arrangements needed? No  ?If their condition worsens, is the pt aware to call PCP or go to the Emergency Dept.? Yes ?Was the patient provided with contact information for the PCP's office or ED? Yes ?Was to pt encouraged to call back with questions or concerns? Yes ? ?

## 2022-02-03 ENCOUNTER — Telehealth: Payer: Self-pay | Admitting: Internal Medicine

## 2022-02-03 NOTE — Telephone Encounter (Signed)
Dr Shearon Stalls, please advise on HST results, thank you. ?

## 2022-02-06 NOTE — Telephone Encounter (Signed)
Patient is calling about her HST results but it is still saying active in the computer but looks like it was completed on her appt desk screen. Please advise  ?

## 2022-02-06 NOTE — Telephone Encounter (Signed)
I don't see any results yet - haven't received them.  ?

## 2022-02-08 ENCOUNTER — Telehealth: Payer: Self-pay | Admitting: Internal Medicine

## 2022-02-08 DIAGNOSIS — G4733 Obstructive sleep apnea (adult) (pediatric): Secondary | ICD-10-CM

## 2022-02-08 NOTE — Telephone Encounter (Signed)
Sleep study shows moderate sleep apnea. Needs to start cpap therapy. Oxygen level went down to 58% in her sleep.  Start auto cpap 5-15 medium facemask with tubing and supplies. I will see her 30-90 days after initiating therapy.  ?

## 2022-02-08 NOTE — Telephone Encounter (Signed)
I reviewed the results and ordered cpap therapy. See separate telephone encounter.  ?

## 2022-02-08 NOTE — Telephone Encounter (Signed)
This study has been read and given to [facility] this morning. When its is in system, then patient can discuss with Dr Shearon Stalls who ordered it.  ?

## 2022-02-08 NOTE — Telephone Encounter (Signed)
-----   Message from Donne Hazel sent at 02/08/2022  2:19 PM EDT ----- ?Hello Dr Shearon Stalls: ?This patient's HST report is ready for review  ?

## 2022-02-09 NOTE — Telephone Encounter (Signed)
Attempted to call pt but unable to reach. Left message for her to return call. 

## 2022-02-10 ENCOUNTER — Encounter: Payer: Self-pay | Admitting: Internal Medicine

## 2022-02-10 NOTE — Telephone Encounter (Signed)
Patient came to the office to get results of sleep study. They were discussed with patient and DME order has been placed to get her set up. Advised patient that once she physically gets machine to call and set up an OV 31-90 days after. She expressed understanding. Nothing further needed at this time. ?

## 2022-02-14 ENCOUNTER — Ambulatory Visit (INDEPENDENT_AMBULATORY_CARE_PROVIDER_SITE_OTHER): Payer: Medicaid Other | Admitting: Physician Assistant

## 2022-02-14 ENCOUNTER — Encounter: Payer: Self-pay | Admitting: Physician Assistant

## 2022-02-14 DIAGNOSIS — M7061 Trochanteric bursitis, right hip: Secondary | ICD-10-CM | POA: Diagnosis not present

## 2022-02-14 MED ORDER — LIDOCAINE HCL 1 % IJ SOLN
5.0000 mL | INTRAMUSCULAR | Status: AC | PRN
  Administered 2022-02-14: 5 mL

## 2022-02-14 MED ORDER — METHYLPREDNISOLONE ACETATE 40 MG/ML IJ SUSP
80.0000 mg | INTRAMUSCULAR | Status: AC | PRN
  Administered 2022-02-14: 80 mg via INTRA_ARTICULAR

## 2022-02-14 NOTE — Progress Notes (Signed)
? ?Office Visit Note ?  ?Patient: Nancy Dickerson           ?Date of Birth: 1981-07-16           ?MRN: [id number] ?Visit Date: 02/14/2022 ?             ?Requested by: Vevelyn Francois, NP ?[address] ?[place],  [place] [address] ?PCP: Vevelyn Francois, NP ? ?Chief Complaint  ?Patient presents with  ?? Right Leg - Pain  ?  Pain in lateral thigh  ? ? ? ? ?HPI: ?Patient is a pleasant 41 year old woman with a 3-year history of intermittent right lateral and anterior thigh pain.  She describes some tingling sensations on the top of her thigh but also pain on her lateral thigh.  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  She is done physical therapy in the past and does not really have time because of her busy work schedule to do physical therapy.  She cannot take NSAIDs because of her anemia.  She has taken steroid Dosepak Dosepaks before but does not really want to do that.  She is being successful in losing weight.  She had an MRI of her back which demonstrate some early facet arthritis at L4-5 L5-S1.  She did get a trochanteric bursa injection with Dr. Ninfa Linden a couple years ago which there really seem to help. ? ?Assessment & Plan: ?Visit Diagnoses: No diagnosis found. ? ?Plan: Findings consistent with trochanteric bursitis but I do think she may have a lumbar radicular element of this.  She did have some relief with an injection into this area of the trochanteric bursa before we will go forward and do this today.  If she does not have much relief we could consider referring her to Dr. Ernestina Patches for evaluation ? ?Follow-Up Instructions: Return if symptoms worsen or fail to improve.  ? ?Ortho Exam ? ?Patient is alert, oriented, no adenopathy, well-dressed, normal affect, normal respiratory effort. ?Examination she has focal tenderness over the trochanteric bursa she has good strength with resisted extension flexion of her ankles legs  and hip.  Sensation is intact ? ?Imaging: ?No results found. ?No images are attached to the encounter. ? ?Labs: ?Lab Results  ?Component Value Date  ? HGBA1C 5.7 (H) 06/03/2020  ? HGBA1C 5.3 07/08/2018  ? HGBA1C 5.1 01/30/2018  ? REPTSTATUS 07/14/2021 FINAL 07/13/2021  ? CULT MULTIPLE SPECIES PRESENT, SUGGEST RECOLLECTION (A) 07/13/2021  ? [place] 12/25/2008  ? ? ? ?Lab Results  ?Component Value Date  ? ALBUMIN 4.6 10/11/2021  ? ALBUMIN 4.4 09/08/2021  ? ALBUMIN 4.0 05/13/2019  ? ? ?Lab Results  ?Component Value Date  ? MG 1.8 01/29/2018  ? ?No results found for: VD25OH ? ?No results found for: PREALBUMIN ? ?  Latest Ref Rng & Units 01/31/2022  ?  3:33 PM 10/11/2021  ?  3:54 PM 10/11/2021  ? 12:00 AM  ?CBC EXTENDED  ?WBC 4.0 - 10.5 K/uL 6.9   CANCELED   7.9    ?RBC 3.87 - 5.11 MIL/uL 3.83   CANCELED   4.16    ?Hemoglobin 12.0 - 15.0 g/dL 9.4   CANCELED   10.0    ?HCT 36.0 - 46.0 % 31.5   CANCELED   32.6    ?Platelets 150 - 400 K/uL 415   CANCELED   524    ?NEUT#  1.4 - 7.0 x10E3/uL   4.4    ?Lymph#   CANCELED   2.7    ? ? ? ?There is no height or weight on file to calculate BMI. ? ?Orders:  ?No orders of the defined types were placed in this encounter. ? ?No orders of the defined types were placed in this encounter. ? ? ? Procedures: ?Large Joint Inj: R greater trochanter on 02/14/2022 9:18 AM ?Indications: pain and diagnostic evaluation ?Details: 22 G 1.5 in and 3.5 in needle, lateral approach ? ?Arthrogram: No ? ?Medications: 5 mL lidocaine 1 %; 80 mg methylPREDNISolone acetate 40 MG/ML ?Outcome: tolerated well, no immediate complications ?Procedure, treatment alternatives, risks and benefits explained, specific risks discussed. Consent was given by the patient.  ? ? ? ?Clinical Data: ?No additional findings. ? ?ROS: ? ?All other systems negative, except as noted in the HPI. ?Review of Systems ? ?Objective: ?Vital Signs: There were no vitals taken for this visit. ? ?Specialty Comments:  ?No specialty  comments available. ? ?PMFS History: ?Patient Active Problem List  ? Diagnosis Date Noted  ?? 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 Chest congestion 07/15/2021  ?? Acute cough 07/15/2021  ?? Tachycardia 06/01/2021  ?? SOB (shortness of breath) 06/01/2021  ?? Attention deficit hyperactivity disorder (ADHD), combined type 01/13/2021  ?? Chronic obstructive pulmonary disease ([place]) 11/26/2020  ?? Moderate episode of recurrent major depressive disorder ([place]) 10/14/2020  ?? Generalized anxiety disorder 10/14/2020  ?? Dysphonia 08/25/2020  ?? Laryngopharyngeal reflux (LPR) 08/25/2020  ?? Pulsatile tinnitus of right ear 08/25/2020  ?? Dysfunctional uterine bleeding 06/03/2020  ?? Patellofemoral pain syndrome of left knee 11/08/2019  ?? Acute pain of left knee 08/13/2019  ?? Insomnia 07/07/2019  ?? Lumbar radicular pain 07/07/2019  ?? Uterine leiomyoma 03/12/2018  ?? Trochanteric bursitis of right hip 03/12/2018  ?? Class 3 severe obesity due to excess calories without serious comorbidity with body mass index (BMI) of 40.0 to 44.9 in adult [facility]) 03/12/2018  ?? Iron deficiency anemia due to chronic blood loss 01/29/2018  ?? Right sided weakness 01/29/2018  ? ?Past Medical History:  ?Diagnosis Date  ?? Anemia   ?? Asthma   ?? Bursitis of hip   ?? Cardiomegaly   ?? Chondromalacia of hip   ?? Chondromalacia of right knee   ?? COPD (chronic obstructive pulmonary disease) ([place])   ?? GERD (gastroesophageal reflux disease)   ?? H/O transfusion of packed red blood cells   ?? Tinnitus   ?  ?Family History  ?Problem Relation Age of Onset  ?? Sudden death Father 65  ?? Asthma Sister   ?? Asthma Son   ?  ?Past Surgical History:  ?Procedure Laterality Date  ?? CERVICAL CONIZATION W/BX N/A 06/25/2019  ? Procedure: CONIZATION CERVIX WITH BIOPSY - COLD KNIFE;  Surgeon: Emily Filbert, MD;  Location: [facility];  Service: Gynecology;  Laterality: N/A;  ?? ENDOMETRIAL ABLATION N/A 06/25/2019  ? Procedure: MINERVA  ABLATION;  Surgeon: Emily Filbert, MD;  Location: [facility];  Service: Gynecology;  Laterality: N/A;  [facility] rep will here confirmed on 06/20/19 CS  ?? HYSTEROSCOPY WITH D & C  06/25/2019  ? Procedure: DILATATION AND CURETTAGE /HYSTEROSCOPY;  Surgeon: Emily Filbert, MD;  Location: [facility];  Service: Gynecology;;  ?? NO PAST SURGERIES    ? ?Social History  ? ?Occupational History  ?? 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  ?Tobacco Use  ?? Smoking status: Never  ??  Smokeless tobacco: Never  ?Vaping Use  ?? Vaping Use: Never used  ?Substance and Sexual Activity  ?? Alcohol use: Yes  ?  Comment: rare  ?? Drug use: Yes  ?  Types: Marijuana  ?  Comment: Occasionally  ?? Sexual activity: Yes  ? ? ? ? ? ?

## 2022-02-20 ENCOUNTER — Telehealth: Payer: Self-pay | Admitting: Radiology

## 2022-02-20 DIAGNOSIS — M4807 Spinal stenosis, lumbosacral region: Secondary | ICD-10-CM

## 2022-02-20 NOTE — Telephone Encounter (Signed)
Patient called office stating troch injection helped a little at first, but she was up and out yesterday and is now experiencing pain all down her leg, not just in the hip. Per Bevely Palmer, patient has some radicular symptoms, and we can refer to Dr. Ernestina Patches. Patient is in agreement with this plan. Referral entered. ?

## 2022-03-01 ENCOUNTER — Telehealth: Payer: Self-pay

## 2022-03-01 ENCOUNTER — Other Ambulatory Visit: Payer: Self-pay | Admitting: Physical Medicine and Rehabilitation

## 2022-03-01 MED ORDER — DIAZEPAM 5 MG PO TABS: ORAL_TABLET | ORAL | 0 refills | Status: DC

## 2022-03-01 NOTE — Telephone Encounter (Signed)
Pt has request Rx for her appt on 6/19, Please Advise

## 2022-03-10 DIAGNOSIS — G4733 Obstructive sleep apnea (adult) (pediatric): Secondary | ICD-10-CM | POA: Diagnosis not present

## 2022-03-16 ENCOUNTER — Ambulatory Visit: Payer: Medicaid Other | Admitting: Internal Medicine

## 2022-03-27 ENCOUNTER — Ambulatory Visit: Payer: Self-pay

## 2022-03-27 ENCOUNTER — Encounter: Payer: Self-pay | Admitting: Physical Medicine and Rehabilitation

## 2022-03-27 ENCOUNTER — Ambulatory Visit (INDEPENDENT_AMBULATORY_CARE_PROVIDER_SITE_OTHER): Payer: Medicaid Other | Admitting: Physical Medicine and Rehabilitation

## 2022-03-27 VITALS — BP 127/89 | HR 91

## 2022-03-27 DIAGNOSIS — M5416 Radiculopathy, lumbar region: Secondary | ICD-10-CM

## 2022-03-27 MED ORDER — METHYLPREDNISOLONE ACETATE 80 MG/ML IJ SUSP
80.0000 mg | Freq: Once | INTRAMUSCULAR | Status: AC
  Administered 2022-03-27: 80 mg

## 2022-03-27 NOTE — Progress Notes (Signed)
Nancy Dickerson - 41 y.o. female MRN [id number]  Date of birth: 02/26/1981  Office Visit Note: Visit Date: 03/27/2022 PCP: Vevelyn Francois, NP Referred by: Vevelyn Francois, NP  Subjective: Chief Complaint  Patient presents with   Lower Back - Pain   Right Leg - Pain   HPI:  Nancy Dickerson is a 41 y.o. female who comes in today at the request of [place], PA-C for planned Right L5-S1 Lumbar Interlaminar epidural steroid injection with fluoroscopic guidance.  The patient has failed conservative care including home exercise, medications, time and activity modification.  This injection will be diagnostic and hopefully therapeutic.  Please see requesting physician notes for further details and justification. MRI reviewed with images and spine model.  MRI reviewed in the note below.    ROS Otherwise per HPI.  Assessment & Plan: Visit Diagnoses:    ICD-10-CM   1. Lumbar radiculopathy  M54.16 XR C-ARM NO REPORT    Epidural Steroid injection    methylPREDNISolone acetate (DEPO-MEDROL) injection 80 mg      Plan: No additional findings.   Meds & Orders:  Meds ordered this encounter  Medications   methylPREDNISolone acetate (DEPO-MEDROL) injection 80 mg    Orders Placed This Encounter  Procedures   XR C-ARM NO REPORT   Epidural Steroid injection    Follow-up: Return for visit to requesting provider as needed.   Procedures: No procedures performed  Lumbar Epidural Steroid Injection - Interlaminar Approach with Fluoroscopic Guidance  Patient: Nancy Dickerson      Date of Birth: 1980/11/23 MRN: [id number] PCP: Vevelyn Francois, NP      Visit Date: 03/27/2022   Universal Protocol:     Consent Given By: the patient  Position: PRONE  Additional Comments: Vital signs were monitored before and after the procedure. Patient was prepped and draped in the usual sterile fashion. The correct patient, procedure, and site was verified.   Injection Procedure Details:    Procedure diagnoses: Lumbar radiculopathy [M54.16]   Meds Administered:  Meds ordered this encounter  Medications   methylPREDNISolone acetate (DEPO-MEDROL) injection 80 mg     Laterality: Right  Location/Site:  L5-S1  Needle: 4.5 in., 20 ga. Tuohy  Needle Placement: Paramedian epidural  Findings:   -Comments: Excellent flow of contrast into the epidural space.  Procedure Details: Using a paramedian approach from the side mentioned above, the region overlying the inferior lamina was localized under fluoroscopic visualization and the soft tissues overlying this structure were infiltrated with 4 ml. of 1% Lidocaine without Epinephrine. The Tuohy needle was inserted into the epidural space using a paramedian approach.   The epidural space was localized using loss of resistance along with counter oblique bi-planar fluoroscopic views.  After negative aspirate for air, blood, and CSF, a 2 ml. volume of Isovue-250 was injected into the epidural space and the flow of contrast was observed. Radiographs were obtained for documentation purposes.    The injectate was administered into the level noted above.   Additional Comments:  The patient tolerated the procedure well Dressing: 2 x 2 sterile gauze and Band-Aid    Post-procedure details: Patient was observed during the procedure. Post-procedure instructions were reviewed.  Patient left the clinic in stable condition.   Clinical History: CLINICAL DATA:  Right leg pain and weakness worsening over the last year.   EXAM: MRI LUMBAR SPINE WITHOUT CONTRAST   TECHNIQUE: Multiplanar, multisequence MR imaging of the lumbar spine was performed. No intravenous  contrast was administered.   COMPARISON:  07/21/2019   FINDINGS: Segmentation:  5 lumbar type vertebral bodies.   Alignment:  Normal   Vertebrae:  Normal   Conus medullaris and cauda equina: Conus extends to the L2 level. Conus and cauda equina appear normal.    Paraspinal and other soft tissues: Normal   Disc levels:   All intervertebral discs are normal. No degeneration, bulge or herniation. The patient has very mild facet osteoarthritis at L4-5 and L5-S1 that could contribute to low back pain. There is no stenosis or neural compression.   IMPRESSION: No change.  No disc pathology.  No stenosis or neural compression.   Very mild facet osteoarthritic changes at L4-5 and L5-S1.     Electronically Signed   By: Nelson Chimes M.D.   On: 05/09/2020 08:57     Objective:  VS:  HT:    WT:   BMI:     BP:127/89  HR:91bpm  TEMP: ( )  RESP:  Physical Exam Vitals and nursing note reviewed.  Constitutional:      General: She is not in acute distress.    Appearance: Normal appearance. She is not ill-appearing.  HENT:     Head: Normocephalic and atraumatic.     Right Ear: External ear normal.     Left Ear: External ear normal.  Eyes:     Extraocular Movements: Extraocular movements intact.  Cardiovascular:     Rate and Rhythm: Normal rate.     Pulses: Normal pulses.  Pulmonary:     Effort: Pulmonary effort is normal. No respiratory distress.  Abdominal:     General: There is no distension.     Palpations: Abdomen is soft.  Musculoskeletal:        General: Tenderness present.     Cervical back: Neck supple.     Right lower leg: No edema.     Left lower leg: No edema.     Comments: Patient has good distal strength with no pain over the greater trochanters.  No clonus or focal weakness.  Skin:    Findings: No erythema, lesion or rash.  Neurological:     General: No focal deficit present.     Mental Status: She is alert and oriented to person, place, and time.     Sensory: No sensory deficit.     Motor: No weakness or abnormal muscle tone.     Coordination: Coordination normal.  Psychiatric:        Mood and Affect: Mood normal.        Behavior: Behavior normal.      Imaging: No results found.

## 2022-03-27 NOTE — Progress Notes (Signed)
Pt state lower back pain that travels down her right leg. Pt state walking, standing and laying down makes the pain worse. Pt state she take [place] meds to help ease her pain.  Numeric Pain Rating Scale and Functional Assessment Average Pain 2   In the last MONTH (on 0-10 scale) has pain interfered with the following?  1. General activity like being  able to carry out your everyday physical activities such as walking, climbing stairs, carrying groceries, or moving a chair?  Rating(10)   +Driver, -BT, -Dye Allergies.

## 2022-03-27 NOTE — Patient Instructions (Signed)

## 2022-04-07 NOTE — Procedures (Signed)
Lumbar Epidural Steroid Injection - Interlaminar Approach with Fluoroscopic Guidance  Patient: Nancy Dickerson      Date of Birth: 30-Dec-1980 MRN: [id number] PCP: Vevelyn Francois, NP      Visit Date: 03/27/2022   Universal Protocol:     Consent Given By: the patient  Position: PRONE  Additional Comments: Vital signs were monitored before and after the procedure. Patient was prepped and draped in the usual sterile fashion. The correct patient, procedure, and site was verified.   Injection Procedure Details:   Procedure diagnoses: Lumbar radiculopathy [M54.16]   Meds Administered:  Meds ordered this encounter  Medications   methylPREDNISolone acetate (DEPO-MEDROL) injection 80 mg     Laterality: Right  Location/Site:  L5-S1  Needle: 4.5 in., 20 ga. Tuohy  Needle Placement: Paramedian epidural  Findings:   -Comments: Excellent flow of contrast into the epidural space.  Procedure Details: Using a paramedian approach from the side mentioned above, the region overlying the inferior lamina was localized under fluoroscopic visualization and the soft tissues overlying this structure were infiltrated with 4 ml. of 1% Lidocaine without Epinephrine. The Tuohy needle was inserted into the epidural space using a paramedian approach.   The epidural space was localized using loss of resistance along with counter oblique bi-planar fluoroscopic views.  After negative aspirate for air, blood, and CSF, a 2 ml. volume of Isovue-250 was injected into the epidural space and the flow of contrast was observed. Radiographs were obtained for documentation purposes.    The injectate was administered into the level noted above.   Additional Comments:  The patient tolerated the procedure well Dressing: 2 x 2 sterile gauze and Band-Aid    Post-procedure details: Patient was observed during the procedure. Post-procedure instructions were reviewed.  Patient left the clinic in stable  condition.

## 2022-04-09 DIAGNOSIS — G4733 Obstructive sleep apnea (adult) (pediatric): Secondary | ICD-10-CM | POA: Diagnosis not present

## 2022-04-10 ENCOUNTER — Telehealth: Payer: Self-pay | Admitting: Physician Assistant

## 2022-04-10 NOTE — Telephone Encounter (Signed)
Patient is requesting a call back from Nancy Dickerson to discuss new muscle spasms she has been experiencing the past few days. Please advise

## 2022-04-13 DIAGNOSIS — G4733 Obstructive sleep apnea (adult) (pediatric): Secondary | ICD-10-CM | POA: Diagnosis not present

## 2022-04-14 ENCOUNTER — Ambulatory Visit: Payer: Medicaid Other | Admitting: Physician Assistant

## 2022-05-08 ENCOUNTER — Other Ambulatory Visit (HOSPITAL_COMMUNITY): Payer: Self-pay

## 2022-10-17 ENCOUNTER — Other Ambulatory Visit (HOSPITAL_COMMUNITY): Payer: Self-pay | Admitting: Psychiatry

## 2022-10-17 DIAGNOSIS — G4700 Insomnia, unspecified: Secondary | ICD-10-CM

## 2022-10-27 ENCOUNTER — Encounter: Payer: Self-pay | Admitting: Hematology

## 2022-10-27 ENCOUNTER — Ambulatory Visit
Admission: EM | Admit: 2022-10-27 | Discharge: 2022-10-27 | Discharge status: Absent without leave | Payer: Medicaid Other

## 2022-10-27 ENCOUNTER — Ambulatory Visit (HOSPITAL_COMMUNITY)
Admission: EM | Admit: 2022-10-27 | Discharge: 2022-10-27 | Disposition: A | Discharge status: Home / Self Care | Payer: Medicaid Other | Attending: Family Medicine | Admitting: Family Medicine

## 2022-10-27 ENCOUNTER — Encounter (HOSPITAL_COMMUNITY): Payer: Self-pay | Admitting: Emergency Medicine

## 2022-10-27 DIAGNOSIS — R1031 Right lower quadrant pain: Secondary | ICD-10-CM

## 2022-10-27 DIAGNOSIS — K5904 Chronic idiopathic constipation: Secondary | ICD-10-CM

## 2022-10-27 DIAGNOSIS — Z3202 Encounter for pregnancy test, result negative: Secondary | ICD-10-CM

## 2022-10-27 LAB — POC URINE PREG, ED: Preg Test, Ur: NEGATIVE

## 2022-10-27 MED ORDER — LACTULOSE 10 GM/15ML PO SOLN: 20.0000 g | Freq: Three times a day (TID) | ORAL | 1 refills | Status: DC

## 2022-10-27 NOTE — ED Triage Notes (Addendum)
Reports new RLQ abdominal pain starting this week. States she hasn't had a normal BM in over two weeks, and has been constipated since. Reports nausea. Has tried miralax, fiber shakes, fleets suppository, and dulcolax with little success. Reports small amounts of hard stool intermittently over the last two weeks, able to pass gas.

## 2022-10-27 NOTE — ED Provider Notes (Signed)
[facility]    CSN: [id number] Arrival date & time: 10/27/22  0803      History   Chief Complaint Chief Complaint  Patient presents with   Abdominal Pain    HPI Nancy Dickerson is a 42 y.o. female.    Abdominal Pain  Here for constipation and right lower abdominal pain.  2 weeks ago she began having trouble having a normal bowel movement.  Is been harder at times has been hard to push out.  No blood in the stool.  She has begun having right lower quadrant and right low back pain in the last 7 days.  She is also nauseated.  No vomiting so far.  She maybe had a fever 1 day.  She did not seek care in the first week of the symptoms because her mother was having surgery.  In this last week she has come several times but we have been so busy that she is not signed and to be seen.  Last menstrual cycle was before her endometrial ablation that was.  But now she states she has been having some vaginal bleeding with this constipation, which will happen in gushes.  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   Chest congestion 07/15/2021   Acute cough 07/15/2021   Tachycardia 06/01/2021   SOB (shortness of breath) 06/01/2021   Attention deficit hyperactivity disorder (ADHD), combined type 01/13/2021   Chronic obstructive pulmonary disease ([place]) 11/26/2020   Moderate episode of recurrent major depressive disorder ([place]) 10/14/2020   Generalized anxiety disorder 10/14/2020   Dysphonia 08/25/2020   Laryngopharyngeal reflux (LPR) 08/25/2020   Pulsatile tinnitus of right ear 08/25/2020   Dysfunctional uterine  bleeding 06/03/2020   Patellofemoral pain syndrome of left knee 11/08/2019   Acute pain of left knee 08/13/2019   Insomnia 07/07/2019   Lumbar radicular pain 07/07/2019   Uterine leiomyoma 03/12/2018   Trochanteric bursitis of right hip 03/12/2018   Class 3 severe obesity due to excess calories without serious comorbidity with body mass index (BMI) of 40.0 to 44.9 in adult ([place]) 03/12/2018   Iron deficiency anemia due to chronic blood loss 01/29/2018   Right sided weakness 01/29/2018    Past Surgical History:  Procedure Laterality Date   CERVICAL CONIZATION W/BX N/A 06/25/2019   Procedure: CONIZATION CERVIX WITH BIOPSY - COLD KNIFE;  Surgeon: Emily Filbert, MD;  Location: [facility];  Service: Gynecology;  Laterality: N/A;   ENDOMETRIAL ABLATION N/A 06/25/2019   Procedure: MINERVA ABLATION;  Surgeon: Emily Filbert, MD;  Location: [facility];  Service: Gynecology;  Laterality: N/A;  [employer] rep will here confirmed on 06/20/19 CS   HYSTEROSCOPY WITH D & C  06/25/2019   Procedure: DILATATION AND CURETTAGE /HYSTEROSCOPY;  Surgeon: Emily Filbert, MD;  Location: [facility];  Service: Gynecology;;   NO PAST SURGERIES      OB History     Gravida  7   Para  1   Term  1  Preterm      AB  1   Living  6      SAB  1   IAB      Ectopic      Multiple      Live Births               Home Medications    Prior to Admission medications   Medication Sig Start Date End Date Taking? Authorizing Provider  lactulose (CHRONULAC) 10 GM/15ML solution Take 30 mLs (20 g total) by mouth 3 (three) times daily. 10/27/22  Yes Barrett Henle, MD  albuterol (PROVENTIL) (2.5 MG/3ML) 0.083% nebulizer solution 2.5 mg every 6 (six) hours as needed. Patient taking differently: Take 2.5 mg by nebulization every 6 (six) hours as needed for wheezing or shortness of breath. 2.5 mg every 6 (six) hours as needed. 07/15/21   Fenton Foy, NP  albuterol  (VENTOLIN HFA) 108 (90 Base) MCG/ACT inhaler Inhale 2 puffs into the lungs every 6 (six) hours as needed for wheezing or shortness of breath. 09/08/21   Vevelyn Francois, NP  cyclobenzaprine (FLEXERIL) 10 MG tablet Take 1 tablet (10 mg total) by mouth 2 (two) times daily as needed for muscle spasms. 01/16/22   Francene Finders, PA-C  diazepam (VALIUM) 5 MG tablet Take one tablet by mouth with food one hour prior to procedure. May repeat 30 minutes prior if needed. 03/01/22   Lorine Bears, NP  diclofenac Sodium (VOLTAREN) 1 % GEL Apply 4 g topically 4 (four) times daily. 10/24/21   Elba Barman, DO  DULoxetine HCl 40 MG CPEP Take 1 tablet by mouth daily. 10/20/21   Salley Slaughter, NP  fluticasone-salmeterol (ADVAIR DISKUS) 250-50 MCG/ACT AEPB Inhale 1 puff into the lungs in the morning and at bedtime. 11/03/21   Spero Geralds, MD  gabapentin (NEURONTIN) 300 MG capsule Take 1 capsule (300 mg total) by mouth 3 (three) times daily. 10/20/21   Salley Slaughter, NP  metoprolol tartrate (LOPRESSOR) 25 MG tablet Take 25 mg by mouth 2 (two) times daily.    [provider]  montelukast (SINGULAIR) 10 MG tablet Take 1 tablet (10 mg total) by mouth at bedtime. 07/15/21   Fenton Foy, NP  Multiple Vitamin (MULTI-VITAMIN) tablet Take 2 tablets by mouth daily. Gummies    [provider]  omeprazole (PRILOSEC) 20 MG capsule Take 1 capsule (20 mg total) by mouth daily. 07/15/21   Fenton Foy, NP  tinidazole (TINDAMAX) 500 MG tablet TAKE 4 TABLETS BY MOUTH AS ONE DOSE 12/08/21   Griffin Basil, MD  zolpidem (AMBIEN) 5 MG tablet Take 1 tablet (5 mg total) by mouth at bedtime as needed for sleep. 10/20/21   Salley Slaughter, NP    Family History Family History  Problem Relation Age of Onset   Sudden death Father 65   Asthma Sister    Asthma Son     Social History Social History   Tobacco Use   Smoking status: Never   Smokeless tobacco: Never  Vaping Use   Vaping Use:  Never used  Substance Use Topics   Alcohol use: Yes    Comment: rare   Drug use: Yes    Types: Marijuana    Comment: Occasionally     Allergies   Patient has no known allergies.   Review of Systems Review of Systems  Gastrointestinal:  Positive for abdominal pain.     Physical Exam Triage Vital Signs  ED Triage Vitals  Enc Vitals Group     BP 10/27/22 0820 128/87     Pulse Rate 10/27/22 0820 92     Resp 10/27/22 0820 16     Temp 10/27/22 0820 98.1 F (36.7 C)     Temp Source 10/27/22 0820 Oral     SpO2 10/27/22 0820 96 %     Weight --      Height --      Head Circumference --      Peak Flow --      Pain Score 10/27/22 0822 8     Pain Loc --      Pain Edu? --      Excl. in [place]? --    No data found.  Updated Vital Signs BP 128/87 (BP Location: Right Arm)   Pulse 92   Temp 98.1 F (36.7 C) (Oral)   Resp 16   SpO2 96%   Visual Acuity Right Eye Distance:   Left Eye Distance:   Bilateral Distance:    Right Eye Near:   Left Eye Near:    Bilateral Near:     Physical Exam Vitals reviewed.  Constitutional:      General: She is not in acute distress.    Appearance: She is not ill-appearing, toxic-appearing or diaphoretic.  HENT:     Mouth/Throat:     Mouth: Mucous membranes are moist.  Eyes:     Extraocular Movements: Extraocular movements intact.     Pupils: Pupils are equal, round, and reactive to light.  Cardiovascular:     Rate and Rhythm: Normal rate and regular rhythm.     Heart sounds: No murmur heard. Pulmonary:     Effort: Pulmonary effort is normal.     Breath sounds: Normal breath sounds.  Abdominal:     Palpations: Abdomen is soft. There is no mass.     Tenderness: There is abdominal tenderness (RLQ and palpation of RUQ causes pain in RLQ).  Skin:    Coloration: Skin is not jaundiced or pale.  Neurological:     General: No focal deficit present.     Mental Status: She is alert and oriented to person, place, and time.  Psychiatric:         Behavior: Behavior normal.      UC Treatments / Results  Labs (all labs ordered are listed, but only abnormal results are displayed) Labs Reviewed  POC URINE PREG, ED    EKG   Radiology No results found.  Procedures Procedures (including critical care time)  Medications Ordered in UC Medications - No data to display  Initial Impression / Assessment and Plan / UC Course  I have reviewed the triage vital signs and the nursing notes.  Pertinent labs & imaging results that were available during my care of the patient were reviewed by me and considered in my medical decision making (see chart for details).       UPT is negative.   We did discuss her possibly going to the emergency room since she has nausea and some right lower quadrant pain.  Since the presentation is not typical for appendicitis and she has this associated hard stool, she does not want to go to the emergency room at present. Rx is sent for lactulose and I have asked her to use a bisacodyl suppository.   She will present to the emergency room if nothing has helped her relieve her constipation or if her pain intensifies Final Clinical Impressions(s) /  UC Diagnoses   Final diagnoses:  Right lower quadrant abdominal pain  Functional constipation     Discharge Instructions      The pregnancy test was negative  Bisacodyl suppository, like Dulcolax or store brand-you can use 1 up to twice daily to get the stool out of your rectum more easily.  This can cause some cramping  Lactulose--take 30 mL or 2 tablespoons 3 times daily.  Hopefully you will begin having results in about 1 to 2 days, and I would continue to take it for another 2 or 3 days, even if you have to reduce the dose to 1 or 2 times daily.     ED Prescriptions     Medication Sig Dispense Auth. Provider   lactulose (CHRONULAC) 10 GM/15ML solution Take 30 mLs (20 g total) by mouth 3 (three) times daily. 473 mL Barrett Henle, MD       PDMP not reviewed this encounter.   Barrett Henle, MD 10/27/22 [phone]

## 2022-10-27 NOTE — Discharge Instructions (Signed)
The pregnancy test was negative  Bisacodyl suppository, like Dulcolax or store brand-you can use 1 up to twice daily to get the stool out of your rectum more easily.  This can cause some cramping  Lactulose--take 30 mL or 2 tablespoons 3 times daily.  Hopefully you will begin having results in about 1 to 2 days, and I would continue to take it for another 2 or 3 days, even if you have to reduce the dose to 1 or 2 times daily.

## 2022-10-30 ENCOUNTER — Encounter: Payer: Self-pay | Admitting: Hematology

## 2022-12-08 DIAGNOSIS — Z419 Encounter for procedure for purposes other than remedying health state, unspecified: Secondary | ICD-10-CM | POA: Diagnosis not present

## 2022-12-12 ENCOUNTER — Encounter: Payer: Self-pay | Admitting: Hematology

## 2022-12-13 ENCOUNTER — Encounter: Payer: Self-pay | Admitting: Hematology

## 2022-12-14 ENCOUNTER — Ambulatory Visit (INDEPENDENT_AMBULATORY_CARE_PROVIDER_SITE_OTHER): Payer: Medicaid Other | Admitting: Physician Assistant

## 2022-12-14 ENCOUNTER — Encounter: Payer: Self-pay | Admitting: Physician Assistant

## 2022-12-14 ENCOUNTER — Ambulatory Visit (INDEPENDENT_AMBULATORY_CARE_PROVIDER_SITE_OTHER): Payer: Medicaid Other

## 2022-12-14 DIAGNOSIS — M25551 Pain in right hip: Secondary | ICD-10-CM | POA: Diagnosis not present

## 2022-12-14 NOTE — Progress Notes (Signed)
Office Visit Note   Patient: Nancy Dickerson           Date of Birth: 07/04/1981           MRN: [id number] Visit Date: 12/14/2022              Requested by: Vevelyn Francois, NP [address] [place],  [place] [address] PCP: Vevelyn Francois, NP  Chief Complaint  Patient presents with   Right Hip - Pain      HPI: Nancy Dickerson is a pleasant 42 year old woman who was seen for right hip pain in the past.  She has had injections into her hip as well as her back.  She comes in with continued difficulties with her right hip and right medial thigh.  She denies any reinjury.  She said when she had the hip injection and it helped about 45 to 50%.  She said this all began after she was told intake that she had strokes that affected the right side of her body.  She is on gabapentin 3 times daily which really does not help her very much.  She feels fasciculations within the medial thigh.  Denies any numbness or tingling.  This is getting to be a quality-of-life issue for her it does hurt more when she bears weight.  Assessment & Plan: Visit Diagnoses:  1. Pain of right hip     Plan: Given her history of right-sided CVAs that was worked up without a significant cause.  She does not really have any residual she did do quite a bit of speech therapy.  But she says a lot of this happened after the strokes.  We will order an MRI of her hip to just to rule out any intra-articular cause.  If this does not show anything would recommend referral to neurology  Follow-Up Instructions: Will call after MRI  Ortho Exam  Patient is alert, oriented, no adenopathy, well-dressed, normal affect, normal respiratory effort. Examination today she has mild tenderness over the trochanteric bursa no tenderness in her spine no radicular findings she has good internal/external rotation of her hip without pain.  With weightbearing it does reproduce some of her pain going from her groin into her medial anterior thigh.  Her  compartments are soft and nontender.  She is neurovascularly intact  Imaging: XR HIP UNILAT W OR W/O PELVIS 1V RIGHT  Result Date: 12/14/2022 Imaging AP pelvis demonstrates no degenerative changes of her hip.  Well-maintained alignment  No images are attached to the encounter.  Labs: Lab Results  Component Value Date   HGBA1C 5.7 (H) 06/03/2020   HGBA1C 5.3 07/08/2018   HGBA1C 5.1 01/30/2018   REPTSTATUS 07/14/2021 FINAL 07/13/2021   CULT MULTIPLE SPECIES PRESENT, SUGGEST RECOLLECTION (A) 07/13/2021   LABORGA PROTEUS MIRABILIS 12/25/2008     Lab Results  Component Value Date   ALBUMIN 4.6 10/11/2021   ALBUMIN 4.4 09/08/2021   ALBUMIN 4.0 05/13/2019    Lab Results  Component Value Date   MG 1.8 01/29/2018   No results found for: "VD25OH"  No results found for: "PREALBUMIN"    Latest Ref Rng & Units 01/31/2022    3:33 PM 10/11/2021    3:54 PM 10/11/2021   12:00 AM  CBC EXTENDED  WBC 4.0 - 10.5 K/uL 6.9  CANCELED  7.9   RBC 3.87 - 5.11 MIL/uL 3.83  CANCELED  4.16   Hemoglobin 12.0 - 15.0 g/dL 9.4  CANCELED  10.0   HCT  36.0 - 46.0 % 31.5  CANCELED  32.6   Platelets 150 - 400 K/uL 415  CANCELED  524   NEUT# 1.4 - 7.0 x10E3/uL   4.4   Lymph#   CANCELED  2.7      There is no height or weight on file to calculate BMI.  Orders:  Orders Placed This Encounter  Procedures   XR HIP UNILAT W OR W/O PELVIS 1V RIGHT   MR Hip Right w/o contrast   No orders of the defined types were placed in this encounter.    Procedures: No procedures performed  Clinical Data: No additional findings.  ROS:  All other systems negative, except as noted in the HPI. Review of Systems  Objective: Vital Signs: There were no vitals taken for this visit.  Specialty Comments:  CLINICAL DATA:  Right leg pain and weakness worsening over the last year.   EXAM: MRI LUMBAR SPINE WITHOUT CONTRAST   TECHNIQUE: Multiplanar, multisequence MR imaging of the lumbar spine was performed. No  intravenous contrast was administered.   COMPARISON:  07/21/2019   FINDINGS: Segmentation:  5 lumbar type vertebral bodies.   Alignment:  Normal   Vertebrae:  Normal   Conus medullaris and cauda equina: Conus extends to the L2 level. Conus and cauda equina appear normal.   Paraspinal and other soft tissues: Normal   Disc levels:   All intervertebral discs are normal. No degeneration, bulge or herniation. The patient has very mild facet osteoarthritis at L4-5 and L5-S1 that could contribute to low back pain. There is no stenosis or neural compression.   IMPRESSION: No change.  No disc pathology.  No stenosis or neural compression.   Very mild facet osteoarthritic changes at L4-5 and L5-S1.     Electronically Signed   By: Nelson Chimes M.D.   On: 05/09/2020 08:57  PMFS History: Patient Active Problem List   Diagnosis Date Noted   Dysplasia of cervix, high grade CIN 2 10/06/2021   Chest congestion 07/15/2021   Acute cough 07/15/2021   Tachycardia 06/01/2021   SOB (shortness of breath) 06/01/2021   Attention deficit hyperactivity disorder (ADHD), combined type 01/13/2021   Chronic obstructive pulmonary disease ([place]) 11/26/2020   Moderate episode of recurrent major depressive disorder ([place]) 10/14/2020   Generalized anxiety disorder 10/14/2020   Dysphonia 08/25/2020   Laryngopharyngeal reflux (LPR) 08/25/2020   Pulsatile tinnitus of right ear 08/25/2020   Dysfunctional uterine bleeding 06/03/2020   Patellofemoral pain syndrome of left knee 11/08/2019   Acute pain of left knee 08/13/2019   Insomnia 07/07/2019   Lumbar radicular pain 07/07/2019   Uterine leiomyoma 03/12/2018   Trochanteric bursitis of right hip 03/12/2018   Class 3 severe obesity due to excess calories without serious comorbidity with body mass index (BMI) of 40.0 to 44.9 in adult ([place]) 03/12/2018   Iron deficiency anemia due to chronic blood loss 01/29/2018   Right sided weakness 01/29/2018   Past  Medical History:  Diagnosis Date   Anemia    Asthma    Bursitis of hip    Cardiomegaly    Chondromalacia of hip    Chondromalacia of right knee    COPD (chronic obstructive pulmonary disease) (HCC)    GERD (gastroesophageal reflux disease)    H/O transfusion of packed red blood cells    Tinnitus     Family History  Problem Relation Age of Onset   Sudden death Father 83   Asthma Sister    Asthma Son  Past Surgical History:  Procedure Laterality Date   CERVICAL CONIZATION W/BX N/A 06/25/2019   Procedure: CONIZATION CERVIX WITH BIOPSY - COLD KNIFE;  Surgeon: Emily Filbert, MD;  Location: [facility];  Service: Gynecology;  Laterality: N/A;   ENDOMETRIAL ABLATION N/A 06/25/2019   Procedure: MINERVA ABLATION;  Surgeon: Emily Filbert, MD;  Location: [facility];  Service: Gynecology;  Laterality: N/A;  [employer] rep will here confirmed on 06/20/19 CS   HYSTEROSCOPY WITH D & C  06/25/2019   Procedure: DILATATION AND CURETTAGE /HYSTEROSCOPY;  Surgeon: Emily Filbert, MD;  Location: [facility];  Service: Gynecology;;   NO PAST SURGERIES     Social History   Occupational History   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  Tobacco Use   Smoking status: Never   Smokeless tobacco: Never  Vaping Use   Vaping Use: Never used  Substance and Sexual Activity   Alcohol use: Yes    Comment: rare   Drug use: Yes    Types: Marijuana    Comment: Occasionally   Sexual activity: Yes

## 2023-01-02 ENCOUNTER — Ambulatory Visit (INDEPENDENT_AMBULATORY_CARE_PROVIDER_SITE_OTHER): Payer: Medicaid Other | Admitting: Orthopaedic Surgery

## 2023-01-02 ENCOUNTER — Encounter: Payer: Self-pay | Admitting: Orthopaedic Surgery

## 2023-01-02 VITALS — Ht 64.0 in | Wt 283.2 lb

## 2023-01-02 DIAGNOSIS — M25551 Pain in right hip: Secondary | ICD-10-CM

## 2023-01-02 MED ORDER — METHYLPREDNISOLONE ACETATE 40 MG/ML IJ SUSP
40.0000 mg | INTRAMUSCULAR | Status: AC | PRN
  Administered 2023-01-02: 40 mg via INTRA_ARTICULAR

## 2023-01-02 MED ORDER — LIDOCAINE HCL 1 % IJ SOLN
3.0000 mL | INTRAMUSCULAR | Status: AC | PRN
  Administered 2023-01-02: 3 mL

## 2023-01-02 NOTE — Progress Notes (Signed)
The patient is a 42 year old female that I have seen before.  She has chronic pain as it relates to her right lower extremity and her right hip and back.  A MRI of her lumbar spine showed just some facet joint arthritis at the right-sided L4-L5.  X-rays of right hip were reviewed recently and those were normal.  She does with chronic pain and does take gabapentin 3 times a day.  She has tried fluoxetine as well and Flexeril.  She still has this chronic pain.  She is actually scheduled for an MRI of her right hip on Friday.  She has had nerve conduction studies which were also normal.  Her BMI is 48.61.  She reports pain on the lateral aspect of her right hip.  On exam her right hip moves smoothly and fluidly with only pain to palpation over the proximal IT band and trochanteric area.  I did provide a steroid injection of her right hip trochanteric area today.  This will not be problematic from my standpoint in terms of her having a right hip MRI.  Will see her back in 2 weeks to go over the MRI findings.  All question concerns were answered and addressed.     Procedure Note  Patient: Nancy Dickerson             Date of Birth: May 09, 1981           MRN: [id number]             Visit Date: 01/02/2023  Procedures: Visit Diagnoses:  1. Pain of right hip     Large Joint Inj: R greater trochanter on 01/02/2023 10:18 AM Indications: pain and diagnostic evaluation Details: 22 G 1.5 in needle, lateral approach  Arthrogram: No  Medications: 3 mL lidocaine 1 %; 40 mg methylPREDNISolone acetate 40 MG/ML Outcome: tolerated well, no immediate complications Procedure, treatment alternatives, risks and benefits explained, specific risks discussed. Consent was given by the patient. Immediately prior to procedure a time out was called to verify the correct patient, procedure, equipment, support staff and site/side marked as required. Patient was prepped and draped in the usual sterile fashion.

## 2023-01-03 ENCOUNTER — Ambulatory Visit: Payer: Medicaid Other | Admitting: Sports Medicine

## 2023-01-04 ENCOUNTER — Ambulatory Visit: Payer: Medicaid Other | Admitting: Sports Medicine

## 2023-01-04 ENCOUNTER — Telehealth: Payer: Self-pay | Admitting: Physician Assistant

## 2023-01-04 ENCOUNTER — Telehealth: Payer: Self-pay | Admitting: Orthopaedic Surgery

## 2023-01-04 NOTE — Telephone Encounter (Signed)
Pt called requesting a call back. Pt is asking for another injection. She states her insurance denied her MRI and she need to get back to work and asking for a call back about this matter. Pt phone number is [phone].

## 2023-01-05 ENCOUNTER — Other Ambulatory Visit: Payer: Medicaid Other

## 2023-01-07 ENCOUNTER — Observation Stay (HOSPITAL_COMMUNITY)
Admission: EM | Admit: 2023-01-07 | Discharge: 2023-01-08 | Disposition: A | Discharge status: Home / Self Care | Payer: Medicaid Other | Attending: Family Medicine | Admitting: Family Medicine

## 2023-01-07 ENCOUNTER — Emergency Department (HOSPITAL_COMMUNITY): Payer: Medicaid Other

## 2023-01-07 ENCOUNTER — Encounter (HOSPITAL_COMMUNITY): Payer: Self-pay

## 2023-01-07 DIAGNOSIS — D72829 Elevated white blood cell count, unspecified: Secondary | ICD-10-CM | POA: Insufficient documentation

## 2023-01-07 DIAGNOSIS — R531 Weakness: Secondary | ICD-10-CM | POA: Diagnosis not present

## 2023-01-07 DIAGNOSIS — R55 Syncope and collapse: Secondary | ICD-10-CM | POA: Diagnosis not present

## 2023-01-07 DIAGNOSIS — J45909 Unspecified asthma, uncomplicated: Secondary | ICD-10-CM | POA: Diagnosis not present

## 2023-01-07 DIAGNOSIS — F411 Generalized anxiety disorder: Secondary | ICD-10-CM | POA: Diagnosis present

## 2023-01-07 DIAGNOSIS — K219 Gastro-esophageal reflux disease without esophagitis: Secondary | ICD-10-CM | POA: Diagnosis present

## 2023-01-07 DIAGNOSIS — Z79899 Other long term (current) drug therapy: Secondary | ICD-10-CM | POA: Insufficient documentation

## 2023-01-07 DIAGNOSIS — J449 Chronic obstructive pulmonary disease, unspecified: Secondary | ICD-10-CM | POA: Insufficient documentation

## 2023-01-07 DIAGNOSIS — G8929 Other chronic pain: Secondary | ICD-10-CM | POA: Insufficient documentation

## 2023-01-07 DIAGNOSIS — R000 Tachycardia, unspecified: Secondary | ICD-10-CM | POA: Diagnosis present

## 2023-01-07 LAB — CBC WITH DIFFERENTIAL/PLATELET
Abs Immature Granulocytes: 0.06 10*3/uL (ref 0.00–0.07)
Basophils Absolute: 0.1 10*3/uL (ref 0.0–0.1)
Basophils Relative: 0 %
Eosinophils Absolute: 0.1 10*3/uL (ref 0.0–0.5)
Eosinophils Relative: 0 %
HCT: 28.9 % — ABNORMAL LOW (ref 36.0–46.0)
Hemoglobin: 8.5 g/dL — ABNORMAL LOW (ref 12.0–15.0)
Immature Granulocytes: 0 %
Lymphocytes Relative: 22 %
Lymphs Abs: 3 10*3/uL (ref 0.7–4.0)
MCH: 22.2 pg — ABNORMAL LOW (ref 26.0–34.0)
MCHC: 29.4 g/dL — ABNORMAL LOW (ref 30.0–36.0)
MCV: 75.5 fL — ABNORMAL LOW (ref 80.0–100.0)
Monocytes Absolute: 1.1 10*3/uL — ABNORMAL HIGH (ref 0.1–1.0)
Monocytes Relative: 8 %
Neutro Abs: 9.3 10*3/uL — ABNORMAL HIGH (ref 1.7–7.7)
Neutrophils Relative %: 70 %
Platelets: 489 10*3/uL — ABNORMAL HIGH (ref 150–400)
RBC: 3.83 MIL/uL — ABNORMAL LOW (ref 3.87–5.11)
RDW: 18 % — ABNORMAL HIGH (ref 11.5–15.5)
WBC: 13.5 10*3/uL — ABNORMAL HIGH (ref 4.0–10.5)
nRBC: 0 % (ref 0.0–0.2)

## 2023-01-07 LAB — ETHANOL: Alcohol, Ethyl (B): <10 mg/dL (ref <10)

## 2023-01-07 LAB — BASIC METABOLIC PANEL
Anion gap: 8 (ref 5–15)
BUN: 16 mg/dL (ref 6–20)
CO2: 24 mmol/L (ref 22–32)
Calcium: 8.6 mg/dL — ABNORMAL LOW (ref 8.9–10.3)
Chloride: 99 mmol/L (ref 98–111)
Creatinine, Ser: 0.94 mg/dL (ref 0.44–1.00)
GFR, Estimated: >60 mL/min (ref >60)
Glucose, Bld: 134 mg/dL — ABNORMAL HIGH (ref 70–99)
Potassium: 3.7 mmol/L (ref 3.5–5.1)
Sodium: 131 mmol/L — ABNORMAL LOW (ref 135–145)

## 2023-01-07 LAB — PROTIME-INR
INR: 1.2 (ref 0.8–1.2)
Prothrombin Time: 14.8 seconds (ref 11.4–15.2)

## 2023-01-07 LAB — APTT: aPTT: 24 seconds (ref 24–36)

## 2023-01-07 LAB — HEPATIC FUNCTION PANEL
ALT: 17 U/L (ref 0–44)
AST: 21 U/L (ref 15–41)
Albumin: 4 g/dL (ref 3.5–5.0)
Alkaline Phosphatase: 58 U/L (ref 38–126)
Bilirubin, Direct: 0.1 mg/dL (ref 0.0–0.2)
Indirect Bilirubin: 0.3 mg/dL (ref 0.3–0.9)
Total Bilirubin: 0.4 mg/dL (ref 0.3–1.2)
Total Protein: 7.9 g/dL (ref 6.5–8.1)

## 2023-01-07 LAB — CBG MONITORING, ED: Glucose-Capillary: 122 mg/dL — ABNORMAL HIGH (ref 70–99)

## 2023-01-07 LAB — HCG, SERUM, QUALITATIVE: Preg, Serum: NEGATIVE

## 2023-01-07 MED ORDER — SODIUM CHLORIDE 0.9 % IV BOLUS
1000.0000 mL | Freq: Once | INTRAVENOUS | Status: AC
  Administered 2023-01-07: 1000 mL via INTRAVENOUS

## 2023-01-07 NOTE — ED Notes (Signed)
Patient transported to CT 

## 2023-01-07 NOTE — ED Notes (Addendum)
Pt called out unable to urinate Pt on a purwick at this time offered a bedpan( pt refused to try) Pt requesting a foley  Bladder scan 784 RN made aware

## 2023-01-07 NOTE — ED Notes (Signed)
To CT

## 2023-01-07 NOTE — Consult Note (Addendum)
2127: Code stroke cart activated at this time  [place] per bedside staff. Pt with symptoms of nausea, R sided weakness more than normal, Headache.  MRS 2  EDP assessed patient at time of patient arrival prior to stroke cart activation. Running code stroke per EDP at this time. Requested that staff pre-pull TNK at this time: No, hx of anemia Pt in CT at time of stroke cart activation.  21:29: TSP paged at this time.  2133: Dr.Zamani on camera at this time. Patient report and history provided to Dr. Olga Millers  2134: Patient returned from CT at this time.  2135: Dr. Olga Millers performing neuro assessment at this time.  Awaiting [facility] official read. Zamani reviewed scans while on camera 2148: Decision: Pt refused TNK. Recommends migraine workup.  2158: messaged Olga Millers with official read of CT scan. No acute intracranial abnormality, aspect 10 acknowledged at 2200

## 2023-01-07 NOTE — ED Notes (Signed)
Called lab r/t new blood work

## 2023-01-07 NOTE — ED Provider Notes (Signed)
[facility] Provider Note   CSN: [id number] Arrival date & time: 01/07/23  2052  An emergency department physician performed an initial assessment on this suspected stroke patient at 2126.  History  Chief Complaint  Patient presents with   Loss of Consciousness   Extremity Weakness    Nancy Dickerson is a 42 y.o. female.   Loss of Consciousness Extremity Weakness     This is a 42 year old female with medical history of anemia, cardiomegaly, chondromalacia of right hip, COPD, GERD presenting to the emergency department due to syncopal event.  Patient states earlier today she was celebrating Easter with family, she felt nauseated about 2 hours ago, driving in the car with her boyfriend.  Asked her to pull over, get out of the vehicle and was standing upright in the lost consciousness for a few seconds.  She thinks her wife caught her and she did not hit her head.  There was no prodromal chest pain or shortness of breath.  She has been having spasming on the right side of her body is being followed by orthopedics for right chronic right hip pain.  She fell like she was weaker on the right side of her body, she is having a headache she had a period of time but was not thunderclap onset.  There is also no neck pain.  She is not on any blood thinners, denies any abdominal pain, vaginal bleeding, changes in her medications.  Home Medications Prior to Admission medications   Medication Sig Start Date End Date Taking? Authorizing Provider  albuterol (PROVENTIL) (2.5 MG/3ML) 0.083% nebulizer solution 2.5 mg every 6 (six) hours as needed. Patient taking differently: Take 2.5 mg by nebulization every 6 (six) hours as needed for wheezing or shortness of breath. 2.5 mg every 6 (six) hours as needed. 07/15/21   Fenton Foy, NP  albuterol (VENTOLIN HFA) 108 (90 Base) MCG/ACT inhaler Inhale 2 puffs into the lungs every 6 (six) hours as needed for wheezing  or shortness of breath. 09/08/21   Vevelyn Francois, NP  cyclobenzaprine (FLEXERIL) 10 MG tablet Take 1 tablet (10 mg total) by mouth 2 (two) times daily as needed for muscle spasms. 01/16/22   Francene Finders, PA-C  diazepam (VALIUM) 5 MG tablet Take one tablet by mouth with food one hour prior to procedure. May repeat 30 minutes prior if needed. 03/01/22   Lorine Bears, NP  diclofenac Sodium (VOLTAREN) 1 % GEL Apply 4 g topically 4 (four) times daily. 10/24/21   Elba Barman, DO  DULoxetine HCl 40 MG CPEP Take 1 tablet by mouth daily. 10/20/21   Salley Slaughter, NP  fluticasone-salmeterol (ADVAIR DISKUS) 250-50 MCG/ACT AEPB Inhale 1 puff into the lungs in the morning and at bedtime. 11/03/21   Spero Geralds, MD  gabapentin (NEURONTIN) 300 MG capsule Take 1 capsule (300 mg total) by mouth 3 (three) times daily. 10/20/21   Salley Slaughter, NP  lactulose (CHRONULAC) 10 GM/15ML solution Take 30 mLs (20 g total) by mouth 3 (three) times daily. 10/27/22   Barrett Henle, MD  metoprolol tartrate (LOPRESSOR) 25 MG tablet Take 25 mg by mouth 2 (two) times daily.    [provider]  montelukast (SINGULAIR) 10 MG tablet Take 1 tablet (10 mg total) by mouth at bedtime. 07/15/21   Fenton Foy, NP  Multiple Vitamin (MULTI-VITAMIN) tablet Take 2 tablets by mouth daily. Gummies    [provider]  omeprazole (PRILOSEC) 20 MG capsule Take 1 capsule (20 mg total) by mouth daily. 07/15/21   Fenton Foy, NP  tinidazole (TINDAMAX) 500 MG tablet TAKE 4 TABLETS BY MOUTH AS ONE DOSE 12/08/21   Griffin Basil, MD  zolpidem (AMBIEN) 5 MG tablet Take 1 tablet (5 mg total) by mouth at bedtime as needed for sleep. 10/20/21   Salley Slaughter, NP      Allergies    Patient has no known allergies.    Review of Systems   Review of Systems  Cardiovascular:  Positive for syncope.  Musculoskeletal:  Positive for extremity weakness.    Physical Exam Updated Vital Signs BP 133/81    Pulse 95   Temp 97.9 F (36.6 C) (Oral)   Resp (!) 25   Ht 5\' 4"  (1.626 m)   Wt 128.4 kg   LMP 12/17/2022 (Approximate)   SpO2 100%   BMI 48.58 kg/m  Physical Exam Vitals and nursing note reviewed. Exam conducted with a chaperone present.  Constitutional:      Appearance: Normal appearance.  HENT:     Head: Normocephalic and atraumatic.  Eyes:     General: No scleral icterus.       Right eye: No discharge.        Left eye: No discharge.     Extraocular Movements: Extraocular movements intact.     Pupils: Pupils are equal, round, and reactive to light.  Cardiovascular:     Rate and Rhythm: Normal rate and regular rhythm.     Pulses: Normal pulses.     Heart sounds: Normal heart sounds. No murmur heard.    No friction rub. No gallop.  Pulmonary:     Effort: Pulmonary effort is normal. No respiratory distress.     Breath sounds: Normal breath sounds.  Abdominal:     General: Abdomen is flat. Bowel sounds are normal. There is no distension.     Palpations: Abdomen is soft.     Tenderness: There is no abdominal tenderness.  Skin:    General: Skin is warm and dry.     Coloration: Skin is not jaundiced.  Neurological:     Mental Status: She is alert. Mental status is at baseline.     GCS: GCS eye subscore is 4. GCS verbal subscore is 5. GCS motor subscore is 6.     Coordination: Coordination normal.     Comments: Cranial nerves II through XII are grossly intact.  Upper extremity strength seems symmetric to me although may be slightly reduced on the right side.  There is no drift, she will not raise her right lower extremity.     ED Results / Procedures / Treatments   Labs (all labs ordered are listed, but only abnormal results are displayed) Labs Reviewed  BASIC METABOLIC PANEL - Abnormal; Notable for the following components:      Result Value   Sodium 131 (*)    Glucose, Bld 134 (*)    Calcium 8.6 (*)    All other components within normal limits  CBC WITH  DIFFERENTIAL/PLATELET - Abnormal; Notable for the following components:   WBC 13.5 (*)    RBC 3.83 (*)    Hemoglobin 8.5 (*)    HCT 28.9 (*)    MCV 75.5 (*)    MCH 22.2 (*)    MCHC 29.4 (*)    RDW 18.0 (*)    Platelets 489 (*)    Neutro Abs 9.3 (*)  Monocytes Absolute 1.1 (*)    All other components within normal limits  CBG MONITORING, ED - Abnormal; Notable for the following components:   Glucose-Capillary 122 (*)    All other components within normal limits  HCG, SERUM, QUALITATIVE  ETHANOL  PROTIME-INR  APTT  HEPATIC FUNCTION PANEL  RAPID URINE DRUG SCREEN, HOSP PERFORMED  URINALYSIS, ROUTINE W REFLEX MICROSCOPIC    EKG EKG Interpretation  Date/Time:  Sunday January 07 2023 21:04:13 EDT Ventricular Rate:  99 PR Interval:  160 QRS Duration: 98 QT Interval:  349 QTC Calculation: 448 R Axis:   36 Text Interpretation: Sinus rhythm Borderline T abnormalities, diffuse leads rate is faster, otherwise similar EKG to April 2023 Confirmed by Sherwood Gambler [phone] on 01/07/2023 9:09:08 PM  Radiology CT HEAD CODE STROKE WO CONTRAST  Result Date: 01/07/2023 CLINICAL DATA:  Code stroke.  Right-sided weakness EXAM: CT HEAD WITHOUT CONTRAST TECHNIQUE: Contiguous axial images were obtained from the base of the skull through the vertex without intravenous contrast. RADIATION DOSE REDUCTION: This exam was performed according to the departmental dose-optimization program which includes automated exposure control, adjustment of the mA and/or kV according to patient size and/or use of iterative reconstruction technique. COMPARISON:  01/31/2022 FINDINGS: Brain: There is no mass, hemorrhage or extra-axial collection. The size and configuration of the ventricles and extra-axial CSF spaces are normal. The brain parenchyma is normal, without evidence of acute or chronic infarction. Vascular: No abnormal hyperdensity of the major intracranial arteries or dural venous sinuses. No intracranial  atherosclerosis. Skull: The visualized skull base, calvarium and extracranial soft tissues are normal. Sinuses/Orbits: No fluid levels or advanced mucosal thickening of the visualized paranasal sinuses. No mastoid or middle ear effusion. The orbits are normal. ASPECTS [facility] Stroke Program Early CT Score) - Ganglionic level infarction (caudate, lentiform nuclei, internal capsule, insula, M1-M3 cortex): 7 - Supraganglionic infarction (M4-M6 cortex): 3 Total score (0-10 with 10 being normal): 10 IMPRESSION: 1. No acute intracranial abnormality. 2. ASPECTS is 10. These results were called by telephone at the time of interpretation on 01/07/2023 at 9:38 pm to provider Shakyla Nolley , who verbally acknowledged these results. Electronically Signed   By: Ulyses Jarred M.D.   On: 01/07/2023 21:51    Procedures Procedures    Medications Ordered in ED Medications  sodium chloride 0.9 % bolus 1,000 mL (1,000 mLs Intravenous New Bag/Given 01/07/23 2123)    ED Course/ Medical Decision Making/ A&P                             Medical Decision Making Amount and/or Complexity of Data Reviewed Labs: ordered. Radiology: ordered.  Risk Decision regarding hospitalization.   Patient presented by EMS due to syncope and right-sided extremity weakness.  She is within the window to activate as a code stroke, on exam she is not willing to raise the right lower extremity -for this reason with guidelines will activate as a code stroke.    Reviewed external medical records, patient was seen in 2019, she had MRI, MRA, CTA head and neck, CT head which were all negative for any signs of infarct, TIA or stroke.  I considered PE, there is no prodromal chest pain or shortness of breath, EKG is without ischemic changes.  I think that is less likely right-sided weakness.    Laboratory workup is notable for anemia, patient has baseline anemic but 8.5 is lower than previous.  Not having any rectal bleeding, black  and tarry stool  or melena.  Patient is not pregnant, not a ruptured ectopic.    Neurology was consulted, CT head was negative.  TNK declined.  Patient will need admission for MRI in the morning, patient is appropriate to stay at any time per neurology recommendations.  I will consult hospitalist for admission.         Final Clinical Impression(s) / ED Diagnoses Final diagnoses:  Syncope, unspecified syncope type  Right sided weakness    Rx / DC Orders ED Discharge Orders     None         Sherrill Raring, Hershal Coria 01/07/23 2221    Sherwood Gambler, MD 01/07/23 2312

## 2023-01-07 NOTE — ED Triage Notes (Addendum)
Patient brought in by EMS. Patient was visiting with family became nauseous and weak on the right side. Patient had a syncopal episode when walking to her car and stated the right side felt heavy. History of stroke in 2019. Headache pain rated 3/10. EMS reports she has been having muscle cramps on legs and thighs. Hx of anemia. No blood thinners.

## 2023-01-07 NOTE — Consult Note (Signed)
[facility] Tele[facility]urology Consult Services   Patient Name:   Nancy Dickerson, Nancy Dickerson Date of Birth:   1981-04-01 Identification Number:   MRN - [id number] Date of Service:   01/07/2023 21:29:17  Diagnosis:       R51.9 - Headache, unspecified  Impression:      Right hemiparesis and hemiparesthesia, syncope  #hx of anemia  #hx of cardiomegaly  Complex migraine vs CVA vs seizures vs cardiogenic/vasovagal syncope vs conversion    Patient's NIHSS was 3  CT head did not show any acute intracranial hemorrhage.  IV thrombolytics (tPA or tNK): Not a candidate because patient refused        Our recommendations are outlined below.  Recommendations:        Stroke/Telemetry Floor       Neuro Checks       Bedside Swallow Eval       DVT Prophylaxis       IV Fluids, Normal Saline       Head of Bed 30 Degrees       Euglycemia and Avoid Hyperthermia (PRN Acetaminophen)       Antihypertensives PRN if Blood pressure is greater than 220/120 or there is a concern for End organ damage/contraindications for permissive HTN. If blood pressure is greater than 220/120 give labetalol PO or IV or Vasotec IV with a goal of 15% reduction in BP during the first 24 hours.       Admit to neurology floor for stroke work-up including lab works (CBC, CMP, TSH, U/A, Utox fasting lipid profile, syphilis EIA screening test, HbA1C),       MRI brain stroke protocol (can order w/w/o),       2D echo to check Ejection fraction, wall motion defects, intracardiac thrombi       Start aspirin and statin if no contraindication for secondary stroke ppx       Consult Heme       Consult cardiology  Sign Out:       Discussed with Emergency Department Provider    ------------------------------------------------------------------------------  Advanced Imaging: Advanced Imaging Deferred because:  Non-disabling symptoms as verified by the patient; no cortical signs so not consistent with LVO   Metrics: Last  Known Well: 01/07/2023 19:30:00 [facility] Notification Time: 01/07/2023 21:29:17 Arrival Time: 01/07/2023 20:52:00 Stamp Time: 01/07/2023 21:29:17 Initial Response Time: 01/07/2023 21:33:37 Symptoms: syncopal episode and worsening right sided weakness and headache.. Initial patient interaction: 01/07/2023 21:37:43 NIHSS Assessment Completed: 01/07/2023 21:37:27 Patient is not a candidate for Thrombolytic. Thrombolytic Medical Decision: 01/07/2023 21:46:05 Patient was not deemed candidate for Thrombolytic because of following reasons: Patient and/or Family refused.  CT head showed no acute hemorrhage or acute core infarct.  Primary Provider Notified of Diagnostic Impression and Management Plan on: 01/07/2023 21:50:31    ------------------------------------------------------------------------------  History of Present Illness: Patient is a 42 year old Female.  Patient was brought by EMS for symptoms of syncopal episode and worsening right sided weakness and headache.. Patient is a 42 year old woman, medical history, significant for migraine, headaches, chronic anemia, cardiomegaly and questionable CVA with negative MRI, residual right sided deficits (2019) presents to ED as a stroke with complaints of syncopal episode and worsening right sided weakness and headache. Last known well approximately 1930 hrs. Patient was walking to car when she was overcome by sudden onset of LOC, exacerbation of right side, weakness, and headache. As of note, patient states headache is rated 5 out of 10 on pain scale and currently not having light or sound sensitivity.  She has been diagnosed with migraine headaches in the past, however denies any previous complex migraine, episodes, or presenting to ED for migraine cocktail. Patient also not being followed by neurology for migraine control, and is not on migraine medication, prophylactic, or abortive. As of note, patient demonstrates right arm, right  leg drift and subtle sensory changes. Per chart review MRI and MRA dated 01/29/18 unremarkable for stroke or other intracranial pathology.   Past Medical History:      Stroke      Migraine Headaches  Medications:  No Anticoagulant use  No Antiplatelet use Reviewed EMR for current medications  Allergies:  Reviewed  Social History: Drug Use: No  Family History:  There is no family history of premature cerebrovascular disease pertinent to this consultation  ROS : 14 Points Review of Systems was performed and was negative except mentioned in HPI.  Past Surgical History: There Is No Surgical History Contributory To Today's Visit    Examination: BP(141/89), Pulse(99), Blood Glucose(134) 1A: Level of Consciousness - Alert; keenly responsive + 0 1B: Ask Month and Age - Both Questions Right + 0 1C: Blink Eyes & Squeeze Hands - Performs Both Tasks + 0 2: Test Horizontal Extraocular Movements - Normal + 0 3: Test Visual Fields - No Visual Loss + 0 4: Test Facial Palsy (Use Grimace if Obtunded) - Normal symmetry + 0 5A: Test Left Arm Motor Drift - No Drift for 10 Seconds + 0 5B: Test Right Arm Motor Drift - Drift, but doesn't hit bed + 1 6A: Test Left Leg Motor Drift - No Drift for 5 Seconds + 0 6B: Test Right Leg Motor Drift - Drift, but doesn't hit bed + 1 7: Test Limb Ataxia (FNF/Heel-Shin) - No Ataxia + 0 8: Test Sensation - Mild-Moderate Loss: Less Sharp/More Dull + 1 9: Test Language/Aphasia - Normal; No aphasia + 0 10: Test Dysarthria - Normal + 0 11: Test Extinction/Inattention - No abnormality + 0  NIHSS Score: 3  NIHSS Free Text : Decreased sensation of left face, arm and leg  Pre-Morbid Modified Rankin Scale: 2 Points = Slight disability; unable to carry out all previous activities, but able to look after own affairs without assistance  Spoke with : Dr. Regenia Skeeter  Patient/Family was informed the Neurology Consult would occur via TeleHealth consult by way of  interactive audio and video telecommunications and consented to receiving care in this manner.   Patient is being evaluated for possible acute neurologic impairment and high probability of imminent or life-threatening deterioration. I spent total of 35 minutes providing care to this patient, including time for face to face visit via telemedicine, review of medical records, imaging studies and discussion of findings with providers, the patient and/or family.   Dr Ellis Savage   [facility] For Inpatient follow-up with [facility] physician please call [facility] [phone]. This is not an outpatient service. Post hospital discharge, please contact hospital directly.  Please do not communicate with [facility] physicians via secure chat. If you have any questions, Please contact [facility]. Please call or reconsult our service if there are any clinical or diagnostic changes.

## 2023-01-08 ENCOUNTER — Observation Stay (HOSPITAL_BASED_OUTPATIENT_CLINIC_OR_DEPARTMENT_OTHER): Payer: Medicaid Other

## 2023-01-08 ENCOUNTER — Observation Stay (HOSPITAL_COMMUNITY): Payer: Medicaid Other

## 2023-01-08 DIAGNOSIS — J449 Chronic obstructive pulmonary disease, unspecified: Secondary | ICD-10-CM

## 2023-01-08 DIAGNOSIS — Z0389 Encounter for observation for other suspected diseases and conditions ruled out: Secondary | ICD-10-CM | POA: Diagnosis not present

## 2023-01-08 DIAGNOSIS — F411 Generalized anxiety disorder: Secondary | ICD-10-CM

## 2023-01-08 DIAGNOSIS — D72829 Elevated white blood cell count, unspecified: Secondary | ICD-10-CM | POA: Diagnosis not present

## 2023-01-08 DIAGNOSIS — R531 Weakness: Secondary | ICD-10-CM | POA: Diagnosis not present

## 2023-01-08 DIAGNOSIS — Z419 Encounter for procedure for purposes other than remedying health state, unspecified: Secondary | ICD-10-CM | POA: Diagnosis not present

## 2023-01-08 DIAGNOSIS — R55 Syncope and collapse: Secondary | ICD-10-CM | POA: Diagnosis not present

## 2023-01-08 DIAGNOSIS — K219 Gastro-esophageal reflux disease without esophagitis: Secondary | ICD-10-CM | POA: Diagnosis not present

## 2023-01-08 DIAGNOSIS — R000 Tachycardia, unspecified: Secondary | ICD-10-CM

## 2023-01-08 DIAGNOSIS — G8929 Other chronic pain: Secondary | ICD-10-CM

## 2023-01-08 LAB — HIV ANTIBODY (ROUTINE TESTING W REFLEX): HIV Screen 4th Generation wRfx: NONREACTIVE

## 2023-01-08 LAB — COMPREHENSIVE METABOLIC PANEL
ALT: 14 U/L (ref 0–44)
AST: 14 U/L — ABNORMAL LOW (ref 15–41)
Albumin: 3.6 g/dL (ref 3.5–5.0)
Alkaline Phosphatase: 55 U/L (ref 38–126)
Anion gap: 10 (ref 5–15)
BUN: 14 mg/dL (ref 6–20)
CO2: 21 mmol/L — ABNORMAL LOW (ref 22–32)
Calcium: 8.6 mg/dL — ABNORMAL LOW (ref 8.9–10.3)
Chloride: 102 mmol/L (ref 98–111)
Creatinine, Ser: 0.67 mg/dL (ref 0.44–1.00)
GFR, Estimated: >60 mL/min (ref >60)
Glucose, Bld: 112 mg/dL — ABNORMAL HIGH (ref 70–99)
Potassium: 3.8 mmol/L (ref 3.5–5.1)
Sodium: 133 mmol/L — ABNORMAL LOW (ref 135–145)
Total Bilirubin: 0.5 mg/dL (ref 0.3–1.2)
Total Protein: 7.5 g/dL (ref 6.5–8.1)

## 2023-01-08 LAB — RAPID URINE DRUG SCREEN, HOSP PERFORMED
Amphetamines: NOT DETECTED
Barbiturates: NOT DETECTED
Benzodiazepines: POSITIVE — AB
Cocaine: NOT DETECTED
Opiates: NOT DETECTED
Tetrahydrocannabinol: POSITIVE — AB

## 2023-01-08 LAB — CBC
HCT: 27 % — ABNORMAL LOW (ref 36.0–46.0)
Hemoglobin: 7.7 g/dL — ABNORMAL LOW (ref 12.0–15.0)
MCH: 21.7 pg — ABNORMAL LOW (ref 26.0–34.0)
MCHC: 28.5 g/dL — ABNORMAL LOW (ref 30.0–36.0)
MCV: 76.1 fL — ABNORMAL LOW (ref 80.0–100.0)
Platelets: 444 10*3/uL — ABNORMAL HIGH (ref 150–400)
RBC: 3.55 MIL/uL — ABNORMAL LOW (ref 3.87–5.11)
RDW: 18.1 % — ABNORMAL HIGH (ref 11.5–15.5)
WBC: 11.9 10*3/uL — ABNORMAL HIGH (ref 4.0–10.5)
nRBC: 0 % (ref 0.0–0.2)

## 2023-01-08 LAB — MAGNESIUM: Magnesium: 2 mg/dL (ref 1.7–2.4)

## 2023-01-08 LAB — ECHOCARDIOGRAM COMPLETE
Area-P 1/2: 2.91 cm2
Height: 64 in
MV M vel: 1.01 m/s
MV Peak grad: 4.1 mmHg
S' Lateral: 3.5 cm
Weight: 4451.53 oz

## 2023-01-08 LAB — URINALYSIS, ROUTINE W REFLEX MICROSCOPIC
Bilirubin Urine: NEGATIVE
Glucose, UA: NEGATIVE mg/dL
Hgb urine dipstick: NEGATIVE
Ketones, ur: NEGATIVE mg/dL
Leukocytes,Ua: NEGATIVE
Nitrite: NEGATIVE
Protein, ur: NEGATIVE mg/dL
Specific Gravity, Urine: 1.011 (ref 1.005–1.030)
pH: 6 (ref 5.0–8.0)

## 2023-01-08 LAB — RPR: RPR Ser Ql: NONREACTIVE

## 2023-01-08 LAB — HEMOGLOBIN A1C
Hgb A1c MFr Bld: 5.6 % (ref 4.8–5.6)
Mean Plasma Glucose: 114 mg/dL

## 2023-01-08 LAB — TSH: TSH: 1.353 u[IU]/mL (ref 0.350–4.500)

## 2023-01-08 MED ORDER — METOPROLOL TARTRATE 25 MG PO TABS
25.0000 mg | ORAL_TABLET | Freq: Two times a day (BID) | ORAL | Status: DC
  Administered 2023-01-08: 25 mg via ORAL
  Filled 2023-01-08: qty 1

## 2023-01-08 MED ORDER — ONDANSETRON HCL 4 MG/2ML IJ SOLN: 4.0000 mg | Freq: Four times a day (QID) | INTRAMUSCULAR | Status: DC | PRN

## 2023-01-08 MED ORDER — ACETAMINOPHEN 325 MG PO TABS
650.0000 mg | ORAL_TABLET | Freq: Four times a day (QID) | ORAL | Status: DC | PRN
  Administered 2023-01-08: 650 mg via ORAL
  Filled 2023-01-08: qty 2

## 2023-01-08 MED ORDER — DULOXETINE HCL 20 MG PO CPEP
40.0000 mg | ORAL_CAPSULE | Freq: Every day | ORAL | Status: DC
  Filled 2023-01-08: qty 2

## 2023-01-08 MED ORDER — ASPIRIN 81 MG PO TBEC
81.0000 mg | DELAYED_RELEASE_TABLET | Freq: Every day | ORAL | Status: DC
  Administered 2023-01-08: 81 mg via ORAL
  Filled 2023-01-08: qty 1

## 2023-01-08 MED ORDER — MOMETASONE FURO-FORMOTEROL FUM 200-5 MCG/ACT IN AERO
2.0000 | INHALATION_SPRAY | Freq: Two times a day (BID) | RESPIRATORY_TRACT | Status: DC
  Filled 2023-01-08: qty 8.8

## 2023-01-08 MED ORDER — KETOROLAC TROMETHAMINE 15 MG/ML IJ SOLN
15.0000 mg | Freq: Once | INTRAMUSCULAR | Status: AC
  Administered 2023-01-08: 15 mg via INTRAVENOUS
  Filled 2023-01-08: qty 1

## 2023-01-08 MED ORDER — OXYCODONE HCL 5 MG PO TABS
5.0000 mg | ORAL_TABLET | ORAL | Status: DC | PRN
  Administered 2023-01-08: 5 mg via ORAL
  Filled 2023-01-08: qty 1

## 2023-01-08 MED ORDER — MORPHINE SULFATE (PF) 2 MG/ML IV SOLN: 2.0000 mg | INTRAVENOUS | Status: DC | PRN

## 2023-01-08 MED ORDER — VALPROATE SODIUM 100 MG/ML IV SOLN
500.0000 mg | Freq: Once | INTRAVENOUS | Status: AC
  Administered 2023-01-08: 500 mg via INTRAVENOUS
  Filled 2023-01-08: qty 5

## 2023-01-08 MED ORDER — ASPIRIN 81 MG PO TBEC: 81.0000 mg | DELAYED_RELEASE_TABLET | Freq: Every day | ORAL | 12 refills | Status: AC

## 2023-01-08 MED ORDER — PANTOPRAZOLE SODIUM 40 MG PO TBEC
40.0000 mg | DELAYED_RELEASE_TABLET | Freq: Every day | ORAL | Status: DC
  Administered 2023-01-08: 40 mg via ORAL
  Filled 2023-01-08: qty 1

## 2023-01-08 MED ORDER — ONDANSETRON HCL 4 MG/2ML IJ SOLN
4.0000 mg | Freq: Once | INTRAMUSCULAR | Status: AC
  Administered 2023-01-08: 4 mg via INTRAVENOUS
  Filled 2023-01-08: qty 2

## 2023-01-08 MED ORDER — ONDANSETRON HCL 4 MG PO TABS: 4.0000 mg | ORAL_TABLET | Freq: Four times a day (QID) | ORAL | Status: DC | PRN

## 2023-01-08 MED ORDER — ATORVASTATIN CALCIUM 40 MG PO TABS
20.0000 mg | ORAL_TABLET | Freq: Every day | ORAL | Status: DC
  Administered 2023-01-08: 20 mg via ORAL
  Filled 2023-01-08: qty 1

## 2023-01-08 MED ORDER — DEXAMETHASONE SODIUM PHOSPHATE 10 MG/ML IJ SOLN
10.0000 mg | Freq: Once | INTRAMUSCULAR | Status: AC
  Administered 2023-01-08: 10 mg via INTRAVENOUS
  Filled 2023-01-08: qty 1

## 2023-01-08 MED ORDER — GABAPENTIN 300 MG PO CAPS
300.0000 mg | ORAL_CAPSULE | Freq: Three times a day (TID) | ORAL | Status: DC
  Administered 2023-01-08: 300 mg via ORAL
  Filled 2023-01-08: qty 1

## 2023-01-08 MED ORDER — MONTELUKAST SODIUM 10 MG PO TABS: 10.0000 mg | ORAL_TABLET | Freq: Every day | ORAL | Status: DC

## 2023-01-08 MED ORDER — HEPARIN SODIUM (PORCINE) 5000 UNIT/ML IJ SOLN
5000.0000 [IU] | Freq: Three times a day (TID) | INTRAMUSCULAR | Status: DC
  Administered 2023-01-08 (×2): 5000 [IU] via SUBCUTANEOUS
  Filled 2023-01-08 (×2): qty 1

## 2023-01-08 MED ORDER — DIAZEPAM 2 MG PO TABS: 2.0000 mg | ORAL_TABLET | Freq: Three times a day (TID) | ORAL | Status: DC | PRN

## 2023-01-08 MED ORDER — PERFLUTREN LIPID MICROSPHERE
1.0000 mL | INTRAVENOUS | Status: AC | PRN
  Administered 2023-01-08: 3 mL via INTRAVENOUS

## 2023-01-08 MED ORDER — ACETAMINOPHEN 650 MG RE SUPP: 650.0000 mg | Freq: Four times a day (QID) | RECTAL | Status: DC | PRN

## 2023-01-08 MED ORDER — ATORVASTATIN CALCIUM 20 MG PO TABS: 20.0000 mg | ORAL_TABLET | Freq: Every day | ORAL | 0 refills | Status: AC

## 2023-01-08 NOTE — Plan of Care (Signed)
  Problem: Acute Rehab PT Goals(only PT should resolve) Goal: Patient Will Transfer Sit To/From Stand Outcome: Progressing Flowsheets (Taken 01/08/2023 1425) Patient will transfer sit to/from stand: with supervision Goal: Pt Will Transfer Bed To Chair/Chair To Bed Outcome: Progressing Flowsheets (Taken 01/08/2023 1425) Pt will Transfer Bed to Chair/Chair to Bed: with supervision Goal: Pt Will Ambulate Outcome: Progressing Flowsheets (Taken 01/08/2023 1425) Pt will Ambulate:  50 feet  with least restrictive assistive device  with min guard assist Goal: Pt/caregiver will Perform Home Exercise Program Outcome: Progressing Flowsheets (Taken 01/08/2023 1425) Pt/caregiver will Perform Home Exercise Program:  For increased strengthening  For improved balance  Independently  2:26 PM, 01/08/23 Mearl Latin PT, DPT Physical Therapist at [facility]

## 2023-01-08 NOTE — Assessment & Plan Note (Addendum)
-   CT head shows no acute findings - Associated with right-sided weakness, right hip pain, right sided spasms - Neuro recommended MRI in the a.m. - MRI ordered - Echo in a.m. - Ultrasound carotids - Monitor on telemetry - Continue to monitor

## 2023-01-08 NOTE — Consult Note (Signed)
I connected with  Nancy Dickerson on 01/08/23 by a video enabled telemedicine application and verified that I am speaking with the correct person using two identifiers.   I discussed the limitations of evaluation and management by telemedicine. The patient expressed understanding and agreed to proceed.  Location of patient: [facility] Location of physician: [facility]   Neurology Consultation Reason for Consult: Right side feeling heavy Referring Physician: Dr. Carlynn Purl  CC: Right-sided feeling heavy  History is obtained from: Patient, chart review  HPI: Nancy Dickerson is a 42 y.o. female with past medical history of right-sided paresthesias in 2019 (MRI negative), migraines not on medications, right leg pain with normal nerve conduction study but reportedly diagnosed with bursitis and received steroid injections with PM&R recently who presented with right-sided heaviness.  Patient states she was sitting in the car yesterday evening sometime around 6 PM and suddenly noticed that her right side felt heavy (face arm and leg).  States she has headaches about 3 days a week and was having a headache yesterday as well as today.  Reports also feeling nauseous.  Was brought in the hospital and seen by telestroke, tPA was offered but patient refused.  Symptoms have since improved but not yet back to baseline.  MRI brain was obtained which did not show any acute abnormality.  Neurology was consulted for further recommendations.  ROS: All other systems reviewed and negative except as noted in the HPI.  Past Medical History:  Diagnosis Date   Anemia    Asthma    Bursitis of hip    Cardiomegaly    Chondromalacia of hip    Chondromalacia of right knee    COPD (chronic obstructive pulmonary disease) (HCC)    GERD (gastroesophageal reflux disease)    H/O transfusion of packed red blood cells    Tinnitus     Family History  Problem Relation Age of Onset   Sudden death  Father 65   Asthma Sister    Asthma Son     Social History:  reports that she has never smoked. She has never used smokeless tobacco. She reports current alcohol use. She reports current drug use. Drug: Marijuana.  Medications Prior to Admission  Medication Sig Dispense Refill Last Dose   albuterol (PROVENTIL) (2.5 MG/3ML) 0.083% nebulizer solution 2.5 mg every 6 (six) hours as needed. (Patient taking differently: Take 2.5 mg by nebulization every 6 (six) hours as needed for wheezing or shortness of breath. 2.5 mg every 6 (six) hours as needed.) 75 mL 1 UNK   albuterol (VENTOLIN HFA) 108 (90 Base) MCG/ACT inhaler Inhale 2 puffs into the lungs every 6 (six) hours as needed for wheezing or shortness of breath. 8 g 2 UNK   DULoxetine HCl 40 MG CPEP Take 1 tablet by mouth daily. 30 capsule 3 01/07/2023   gabapentin (NEURONTIN) 300 MG capsule Take 1 capsule (300 mg total) by mouth 3 (three) times daily. 90 capsule 3 Past Month   diclofenac Sodium (VOLTAREN) 1 % GEL Apply 4 g topically 4 (four) times daily. (Patient not taking: Reported on 01/08/2023) 4 g 0 Not Taking   fluticasone-salmeterol (ADVAIR DISKUS) 250-50 MCG/ACT AEPB Inhale 1 puff into the lungs in the morning and at bedtime. (Patient not taking: Reported on 01/08/2023) 60 each 3 Not Taking   metoprolol tartrate (LOPRESSOR) 25 MG tablet Take 25 mg by mouth 2 (two) times daily. (Patient not taking: Reported on 01/08/2023)   Not Taking  montelukast (SINGULAIR) 10 MG tablet Take 1 tablet (10 mg total) by mouth at bedtime. (Patient not taking: Reported on 01/08/2023) 30 tablet 3 Not Taking   omeprazole (PRILOSEC) 20 MG capsule Take 1 capsule (20 mg total) by mouth daily. (Patient not taking: Reported on 01/08/2023) 30 capsule 3 Not Taking   zolpidem (AMBIEN) 5 MG tablet Take 1 tablet (5 mg total) by mouth at bedtime as needed for sleep. (Patient not taking: Reported on 01/08/2023) 30 tablet 2 Not Taking      Exam: Current vital signs: BP (!) 142/90 (BP  Location: Left Arm)   Pulse 73   Temp 98 F (36.7 C) (Oral)   Resp 18   Ht 5\' 4"  (1.626 m)   Wt 126.2 kg   LMP 12/17/2022 (Approximate)   SpO2 100%   BMI 47.76 kg/m  Vital signs in last 24 hours: Temp:  [97.6 F (36.4 C)-98.1 F (36.7 C)] 98 F (36.7 C) (04/01 1223) Pulse Rate:  [65-107] 73 (04/01 1223) Resp:  [13-26] 18 (04/01 1223) BP: (121-148)/(73-110) 142/90 (04/01 1223) SpO2:  [90 %-100 %] 100 % (04/01 1223) Weight:  [126.2 kg-128.4 kg] 126.2 kg (04/01 0127)   Physical Exam  Constitutional: Appears well-developed and well-nourished.  Psych: Affect appropriate to situation Neuro: AOx3, no aphasia, cranial nerves 2- 12 grossly intact except right facial numbness, antigravity strength without drift in upper extremities, decree sensation to touch in right upper and right lower extremity, FTN intact bilaterally  NIHSS 1  INPUTS: 1A: Level of consciousness --> 0 = Alert; keenly responsive 1B: Ask month and age --> 0 = Both questions right 1C: 'Blink eyes' & 'squeeze hands' --> 0 = Performs both tasks 2: Horizontal extraocular movements --> 0 = Normal 3: Visual fields --> 0 = No visual loss 4: Facial palsy --> 0 = Normal symmetry 5A: Left arm motor drift --> 0 = No drift for 10 seconds 5B: Right arm motor drift --> 0 = No drift for 10 seconds 6A: Left leg motor drift --> 0 = No drift for 5 seconds 6B: Right leg motor drift --> 0 = No drift for 5 seconds 7: Limb Ataxia --> 0 = No ataxia 8: Sensation --> 1 = Mild-moderate loss: less sharp/more dull  9: Language/aphasia --> 0 = Normal; no aphasia 10: Dysarthria --> 0 = Normal 11: Extinction/inattention --> 0 = No abnormality  I have reviewed labs in epic and the results pertinent to this consultation are: CBC:  Recent Labs  Lab 01/07/23 2106 01/08/23 0446  WBC 13.5* 11.9*  NEUTROABS 9.3*  --   HGB 8.5* 7.7*  HCT 28.9* 27.0*  MCV 75.5* 76.1*  PLT 489* 444*    Basic Metabolic Panel:  Lab Results  Component  Value Date   NA 133 (L) 01/08/2023   K 3.8 01/08/2023   CO2 21 (L) 01/08/2023   GLUCOSE 112 (H) 01/08/2023   BUN 14 01/08/2023   CREATININE 0.67 01/08/2023   CALCIUM 8.6 (L) 01/08/2023   GFRNONAA >60 01/08/2023   GFRAA >60 06/21/2020   Lipid Panel:  Lab Results  Component Value Date   LDLCALC 91 09/08/2021   HgbA1c:  Lab Results  Component Value Date   HGBA1C 5.7 (H) 06/03/2020   Urine Drug Screen:     Component Value Date/Time   LABOPIA NONE DETECTED 01/07/2023 2347   COCAINSCRNUR NONE DETECTED 01/07/2023 2347   LABBENZ POSITIVE (A) 01/07/2023 2347   AMPHETMU NONE DETECTED 01/07/2023 2347   THCU POSITIVE (A) 01/07/2023  [place] DETECTED 01/07/2023 2347    Alcohol Level     Component Value Date/Time   ETH <10 01/07/2023 2106     I have reviewed the images obtained:  MRI brain without contrast 01/08/2023: No acute abnormality. Rare punctate foci of T2 hyperintensity in the white matter of the bilateral frontal lobes, nonspecific. Differential diagnosis include migraine, demyelinating disease and post inflammatory/infectious processes. Partial empty sella. This can be seen in the setting of idiopathic intracranial hypertension. Diffuse decrease of the T1 signal in the visualized osseous structures may be related to red marrow reconversion in the setting of anemia.  ASSESSMENT/PLAN: 42 year old female with right-sided face arm and paresthesias which are gradually improving.  Right-sided paresthesias - Differentials include migraine with aura versus somatoform.  Patient has had similar symptoms in the past with negative MRI.  Therefore unlikely stroke.  The duration of symptoms makes it unlikely to be seizure.  Recommendations: -Will order migraine cocktail to see if it helps with symptoms -PT/OT -Recommend follow-up with neurology.  Patient has had chronic headache.  Although she did not endorse typical symptoms of IIH, she may need further workup as an  outpatient, order placed -As no clear evidence of stroke, no significant orthostatic block in the carotid and vertebral arteries, will hold off on starting antiplatelets -Discussed plan with patient and Dr. Doristine Bosworth  Thank you for allowing [place] to participate in the care of this patient. If you have any further questions, please contact  me or neurohospitalist.   Zeb Comfort [facility] neurohospitalist

## 2023-01-08 NOTE — H&P (Signed)
History and Physical    Patient: Nancy Dickerson [id number] DOB: November 13, 1980 DOA: 01/07/2023 DOS: the patient was seen and examined on 01/08/2023 PCP: Vevelyn Francois, NP  Patient coming from: Home  Chief Complaint:  Chief Complaint  Patient presents with   Loss of Consciousness   Extremity Weakness   HPI: Nancy Dickerson is a 42 y.o. female with medical history significant of anemia, cardiomegaly, COPD, GERD, tinnitus, and more presents the ED with a chief complaint of collapse.  Patient reports that she collapsed from standing and her family called 36.  She reports that she remembers before the syncopal episode she was in the car and she was not feeling well.  She reports nausea but no vomiting.  She had lower abdominal pain.  She denies any chest pain, but did have palpitations.  She is not sure how long the palpitations lasted.  She denied shortness of breath and fever.  She had arrived at her destination, and had been there approximately 10 minutes when she was standing and collapsed to the ground.  She reports no preceding symptoms other than what ever already mention.  She reports her boyfriend caught her so she does not think she hit her head.  She is not sure how long she was out.  The next thing She remembers is laying in the ER per her report.  Something similar happened to her in 2019.  At that time she was told she had a stroke per her report.  That time and at this time she had associated right-sided weakness.  She is not sure when the right-sided weakness started.  She reports she is still experiencing it.  She reports she had slurred speech after the syncopal event.  She is not sure when it resolved.  She reports dysphagia and describes it as feeling funny to swallow.  She did not notice any drooling.  She denies any paresthesias or numbness.  She has not had a "big headache, but has only had her normal headache.  She has no other complaints at this time.  Patient does not smoke  cigarettes.  She does smoke weed and her last use was today.  She does not drink alcohol.  She is not vaccinated for COVID or flu.  Patient is full code. Review of Systems: As mentioned in the history of present illness. All other systems reviewed and are negative. Past Medical History:  Diagnosis Date   Anemia    Asthma    Bursitis of hip    Cardiomegaly    Chondromalacia of hip    Chondromalacia of right knee    COPD (chronic obstructive pulmonary disease) (HCC)    GERD (gastroesophageal reflux disease)    H/O transfusion of packed red blood cells    Tinnitus    Past Surgical History:  Procedure Laterality Date   CERVICAL CONIZATION W/BX N/A 06/25/2019   Procedure: CONIZATION CERVIX WITH BIOPSY - COLD KNIFE;  Surgeon: Emily Filbert, MD;  Location: [place];  Service: Gynecology;  Laterality: N/A;   ENDOMETRIAL ABLATION N/A 06/25/2019   Procedure: MINERVA ABLATION;  Surgeon: Emily Filbert, MD;  Location: [place];  Service: Gynecology;  Laterality: N/A;  Minerva rep will here confirmed on 06/20/19 CS   HYSTEROSCOPY WITH D & C  06/25/2019   Procedure: DILATATION AND CURETTAGE /HYSTEROSCOPY;  Surgeon: Emily Filbert, MD;  Location: [place];  Service: Gynecology;;   NO PAST SURGERIES  Social History:  reports that she has never smoked. She has never used smokeless tobacco. She reports current alcohol use. She reports current drug use. Drug: Marijuana.  No Known Allergies  Family History  Problem Relation Age of Onset   Sudden death Father 37   Asthma Sister    Asthma Son     Prior to Admission medications   Medication Sig Start Date End Date Taking? Authorizing Provider  albuterol (PROVENTIL) (2.5 MG/3ML) 0.083% nebulizer solution 2.5 mg every 6 (six) hours as needed. Patient taking differently: Take 2.5 mg by nebulization every 6 (six) hours as needed for wheezing or shortness of breath. 2.5 mg every 6 (six) hours as needed.  07/15/21   Fenton Foy, NP  albuterol (VENTOLIN HFA) 108 (90 Base) MCG/ACT inhaler Inhale 2 puffs into the lungs every 6 (six) hours as needed for wheezing or shortness of breath. 09/08/21   Vevelyn Francois, NP  cyclobenzaprine (FLEXERIL) 10 MG tablet Take 1 tablet (10 mg total) by mouth 2 (two) times daily as needed for muscle spasms. 01/16/22   Francene Finders, PA-C  diazepam (VALIUM) 5 MG tablet Take one tablet by mouth with food one hour prior to procedure. May repeat 30 minutes prior if needed. 03/01/22   Lorine Bears, NP  diclofenac Sodium (VOLTAREN) 1 % GEL Apply 4 g topically 4 (four) times daily. 10/24/21   Elba Barman, DO  DULoxetine HCl 40 MG CPEP Take 1 tablet by mouth daily. 10/20/21   Salley Slaughter, NP  fluticasone-salmeterol (ADVAIR DISKUS) 250-50 MCG/ACT AEPB Inhale 1 puff into the lungs in the morning and at bedtime. 11/03/21   Spero Geralds, MD  gabapentin (NEURONTIN) 300 MG capsule Take 1 capsule (300 mg total) by mouth 3 (three) times daily. 10/20/21   Salley Slaughter, NP  lactulose (CHRONULAC) 10 GM/15ML solution Take 30 mLs (20 g total) by mouth 3 (three) times daily. 10/27/22   Barrett Henle, MD  metoprolol tartrate (LOPRESSOR) 25 MG tablet Take 25 mg by mouth 2 (two) times daily.    [provider]  montelukast (SINGULAIR) 10 MG tablet Take 1 tablet (10 mg total) by mouth at bedtime. 07/15/21   Fenton Foy, NP  Multiple Vitamin (MULTI-VITAMIN) tablet Take 2 tablets by mouth daily. Gummies    [provider]  omeprazole (PRILOSEC) 20 MG capsule Take 1 capsule (20 mg total) by mouth daily. 07/15/21   Fenton Foy, NP  tinidazole (TINDAMAX) 500 MG tablet TAKE 4 TABLETS BY MOUTH AS ONE DOSE 12/08/21   Griffin Basil, MD  zolpidem (AMBIEN) 5 MG tablet Take 1 tablet (5 mg total) by mouth at bedtime as needed for sleep. 10/20/21   Salley Slaughter, NP    Physical Exam: Vitals:   01/08/23 0000 01/08/23 0030 01/08/23 0127 01/08/23 0327   BP: (!) 142/80 136/79 121/78 121/73  Pulse: 91 98 81 76  Resp: (!) 21 (!) 24 20 20   Temp: 98.1 F (36.7 C)  97.9 F (36.6 C) 97.6 F (36.4 C)  TempSrc: Oral  Oral Oral  SpO2: 94% 90% 100% 96%  Weight:   126.2 kg   Height:       1.  General: Patient lying supine in bed,  no acute distress   2. Psychiatric: Alert and oriented x 3, mood and behavior normal for situation, pleasant and cooperative with exam   3. Neurologic: Speech and language are normal, face is symmetric, moves all 4 extremities voluntarily,  very subtle right upper and lower extremity weakness compared to left, difficult to determine if it is effort related or not,   4. HEENMT:  Head is atraumatic, normocephalic, pupils reactive to light, neck is supple, trachea is midline, mucous membranes are moist   5. Respiratory : Lungs are clear to auscultation bilaterally without wheezing, rhonchi, rales, no cyanosis, no increase in work of breathing or accessory muscle use   6. Cardiovascular : Heart rate normal, rhythm is regular, no murmurs, rubs or gallops, no peripheral edema, peripheral pulses palpated   7. Gastrointestinal:  Abdomen is soft, nondistended, nontender to palpation bowel sounds active, no masses or organomegaly palpated   8. Skin:  Skin is warm, dry and intact without rashes, acute lesions, or ulcers on limited exam   9.Musculoskeletal:  No acute deformities or trauma, no asymmetry in tone, no peripheral edema, peripheral pulses palpated, no tenderness to palpation in the extremities  Data Reviewed: In the ED Temp 97.9, heart rate 95-27, respiratory rate 18-25, blood pressure 133/81-141/89, satting 93-100% Leukocytosis 13.5, hemoglobin 8.5, platelets 489 Alcohol level less than 10 EKG shows a heart rate of 99, sinus rhythm, QTc 448 CT head shows nothing acute Neuro was consulted and recommended MRI Neurology also recommended consults to cardiology and hematology-deferring to day  team Assessment and Plan: * Syncope - CT head shows no acute findings - Associated with right-sided weakness, right hip pain, right sided spasms - Neuro recommended MRI in the a.m. - MRI ordered - Echo in a.m. - Ultrasound carotids - Monitor on telemetry - Continue to monitor  Leukocytosis With blood cell count 13.5 - No infectious symptoms - Likely acute phase reactant - Trend in the a.m.  Chronic pain - Continue Cymbalta, continue gabapentin, continue the same for muscle spasms, continue to monitor  Tachycardia - History of tachycardia - Continue metoprolol  Chronic obstructive pulmonary disease - Continue Dulera, continue montelukast - Continue to monitor  GERD (gastroesophageal reflux disease) - Continue PPI  Generalized anxiety disorder - Continue Valium for anxiety - Continue Cymbalta - Continue to monitor      Advance Care Planning:   Code Status: Full Code  Consults: Neurology  Family Communication: No family at bedside  Severity of Illness: The appropriate patient status for this patient is OBSERVATION. Observation status is judged to be reasonable and necessary in order to provide the required intensity of service to ensure the patient's safety. The patient's presenting symptoms, physical exam findings, and initial radiographic and laboratory data in the context of their medical condition is felt to place them at decreased risk for further clinical deterioration. Furthermore, it is anticipated that the patient will be medically stable for discharge from the hospital within 2 midnights of admission.   Author: Rolla Plate, DO 01/08/2023 4:16 AM  For on call review [email or web address].

## 2023-01-08 NOTE — Progress Notes (Addendum)
MRI here to get pt.

## 2023-01-08 NOTE — Discharge Summary (Signed)
Physician Discharge Summary  [name] [id number] DOB: Jan 31, 1981 DOA: 01/07/2023  PCP: Vevelyn Francois, NP  Admit date: 01/07/2023 Discharge date: 01/08/2023    Admitted From: Home Disposition: Home  Recommendations for Outpatient Follow-up:  Follow up with PCP in 1-2 weeks Please obtain BMP/CBC in one week Follow-up with neurology in few weeks. Please follow up with your PCP on the following pending results: Unresulted Labs (From admission, onward)     Start     Ordered   01/08/23 0500  RPR  Tomorrow morning,   R        01/08/23 0422   01/08/23 0500  Hemoglobin A1c  Tomorrow morning,   R        01/08/23 0422              Home Health: None Equipment/Devices: Walker  Discharge Condition: Stable CODE STATUS: Full code Diet recommendation: Cardiac  Following HPI and ED course is copied from admitting hospitalist H&P. HPI: Nancy Dickerson is a 42 y.o. female with medical history significant of anemia, cardiomegaly, COPD, GERD, tinnitus, and more presents the ED with a chief complaint of collapse.  Patient reports that she collapsed from standing and her family called 26.  She reports that she remembers before the syncopal episode she was in the car and she was not feeling well.  She reports nausea but no vomiting.  She had lower abdominal pain.  She denies any chest pain, but did have palpitations.  She is not sure how long the palpitations lasted.  She denied shortness of breath and fever.  She had arrived at her destination, and had been there approximately 10 minutes when she was standing and collapsed to the ground.  She reports no preceding symptoms other than what ever already mention.  She reports her boyfriend caught her so she does not think she hit her head.  She is not sure how long she was out.  The next thing She remembers is laying in the ER per her report.  Something similar happened to her in 2019.  At that time she was told she had a stroke per her report.   That time and at this time she had associated right-sided weakness.  She is not sure when the right-sided weakness started.  She reports she is still experiencing it.  She reports she had slurred speech after the syncopal event.  She is not sure when it resolved.  She reports dysphagia and describes it as feeling funny to swallow.  She did not notice any drooling.  She denies any paresthesias or numbness.  She has not had a "big headache, but has only had her normal headache.  She has no other complaints at this time.   Patient does not smoke cigarettes.  She does smoke weed and her last use was today.  She does not drink alcohol.  She is not vaccinated for COVID or flu.  Patient is full code.  Subjective: Seen and examined in the morning.  Patient still had right-sided weakness which was improving.  Still had headache which was also improving.  No other complaint.  I discussed with her and she endorsed having significant stress in her life regarding deaths a few of with family members recently as well as losing job a few months ago and financial stresses.  Brief/Interim Summary: Patient was admitted with concerns of right-sided weakness and possible stroke.  Patient underwent extensive workup including CT head, carotid Doppler as well as MRI  brain and all of them were negative for acute stroke.  MRI however showed punctate foci of T2 hyperintensity in the white matter of the bilateral frontal lobes, nonspecific.  Differential could be migraine, demyelinating disease and post inflammation/infectious process.  Partial empty sella.  Patient was seen by neurology.  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  According to neurology's note, patient did have similar kind of presentation with negative MRI in the past.  Patient also endorsed stressful situations in her life as well.  She was assessed by PT OT and they recommended outpatient PT OT along with walker  which was arranged for her by our [facility] team.  Neurology cleared the patient for discharge after she was given migraine cocktail.  Neurology did not recommend any new medications.  Discharge plan was discussed with patient and/or family member and they verbalized understanding and agreed with it.  Discharge Diagnoses:  Principal Problem:   Syncope Active Problems:   Generalized anxiety disorder   GERD (gastroesophageal reflux disease)   Chronic obstructive pulmonary disease   Tachycardia   Chronic pain   Leukocytosis    Discharge Instructions  Discharge Instructions     Ambulatory referral to Neurology   Complete by: As directed    An appointment is requested in approximately: 8 weeks   Ambulatory referral to Physical Therapy   Complete by: As directed       Allergies as of 01/08/2023   No Known Allergies      Medication List     STOP taking these medications    diclofenac Sodium 1 % Gel Commonly known as: Voltaren   fluticasone-salmeterol 250-50 MCG/ACT Aepb Commonly known as: Advair Diskus   metoprolol tartrate 25 MG tablet Commonly known as: LOPRESSOR   montelukast 10 MG tablet Commonly known as: SINGULAIR   omeprazole 20 MG capsule Commonly known as: PRILOSEC   zolpidem 5 MG tablet Commonly known as: Ambien       TAKE these medications    albuterol (2.5 MG/3ML) 0.083% nebulizer solution Commonly known as: PROVENTIL 2.5 mg every 6 (six) hours as needed. What changed:  how much to take how to take this when to take this reasons to take this   albuterol 108 (90 Base) MCG/ACT inhaler Commonly known as: VENTOLIN HFA Inhale 2 puffs into the lungs every 6 (six) hours as needed for wheezing or shortness of breath. What changed: Another medication with the same name was changed. Make sure you understand how and when to take each.   aspirin EC 81 MG tablet Take 1 tablet (81 mg total) by mouth daily. Swallow whole. Start taking on: January 09, 2023    atorvastatin 20 MG tablet Commonly known as: LIPITOR Take 1 tablet (20 mg total) by mouth daily. Start taking on: January 09, 2023   DULoxetine HCl 40 MG Cpep Take 1 tablet by mouth daily.   gabapentin 300 MG capsule Commonly known as: Neurontin Take 1 capsule (300 mg total) by mouth 3 (three) times daily.        No Known Allergies  Consultations: Neurology   Procedures/Studies: ECHOCARDIOGRAM COMPLETE  Result Date: 01/08/2023    ECHOCARDIOGRAM REPORT   Patient Name:   Jewell Tyrone Sage Date of Exam: 01/08/2023 Medical Rec #:  [id number]      Height:       64.0 in Accession #:    [id number]     Weight:       278.2 lb Date  of Birth:  Dec 16, 1980     BSA:          2.251 m Patient Age:    41 years       BP:           148/80 mmHg Patient Gender: F              HR:           67 bpm. Exam Location:  [facility] Procedure: 2D Echo, 3D Echo, Cardiac Doppler, Color Doppler and Intracardiac            Opacification Agent Indications:    Syncope R55  History:        Patient has prior history of Echocardiogram examinations, most                 recent 01/29/2018. Arrythmias:Tachycardia,                 Signs/Symptoms:Syncope; Risk Factors:Non-Smoker.  Sonographer:    Greer Pickerel Referring Phys: [id number] ASIA B Lansing  Sonographer Comments: Image acquisition challenging due to patient body habitus, Image acquisition challenging due to respiratory motion and Image acquisition challenging due to COPD. Global longitudinal strain was attempted. IMPRESSIONS  1. Left ventricular ejection fraction, by estimation, is 45 to 50%. The left ventricle has mildly decreased function. The left ventricle has no regional wall motion abnormalities. There is mild left ventricular hypertrophy. Left ventricular diastolic parameters were normal.  2. Right ventricular systolic function is normal. The right ventricular size is normal. There is normal pulmonary artery systolic pressure.  3. The mitral valve is normal in structure. No  evidence of mitral valve regurgitation. No evidence of mitral stenosis.  4. The aortic valve is tricuspid. There is mild calcification of the aortic valve. Aortic valve regurgitation is not visualized. No aortic stenosis is present. Comparison(s): Prior images reviewed side by side. Changes from prior study are noted. LVEF was mildly reduced compared to prior Echo in 2019. FINDINGS  Left Ventricle: Left ventricular ejection fraction, by estimation, is 45 to 50%. The left ventricle has mildly decreased function. The left ventricle has no regional wall motion abnormalities. Definity contrast agent was given IV to delineate the left ventricular endocardial borders. The left ventricular internal cavity size was normal in size. There is mild left ventricular hypertrophy. Left ventricular diastolic parameters were normal. Right Ventricle: The right ventricular size is normal. No increase in right ventricular wall thickness. Right ventricular systolic function is normal. There is normal pulmonary artery systolic pressure. The tricuspid regurgitant velocity is 1.15 m/s, and  with an assumed right atrial pressure of 3 mmHg, the estimated right ventricular systolic pressure is 8.3 mmHg. Left Atrium: Left atrial size was normal in size. Right Atrium: Right atrial size was normal in size. Pericardium: There is no evidence of pericardial effusion. Mitral Valve: The mitral valve is normal in structure. No evidence of mitral valve regurgitation. No evidence of mitral valve stenosis. Tricuspid Valve: The tricuspid valve is grossly normal. Tricuspid valve regurgitation is not demonstrated. No evidence of tricuspid stenosis. Aortic Valve: The aortic valve is tricuspid. There is mild calcification of the aortic valve. Aortic valve regurgitation is not visualized. No aortic stenosis is present. Pulmonic Valve: The pulmonic valve was not well visualized. Pulmonic valve regurgitation is not visualized. No evidence of pulmonic stenosis.  Aorta: The aortic root and ascending aorta are structurally normal, with no evidence of dilitation. Venous: The inferior vena cava was not well visualized. IAS/Shunts: No atrial  level shunt detected by color flow Doppler.  LEFT VENTRICLE PLAX 2D LVIDd:         5.00 cm   Diastology LVIDs:         3.50 cm   LV e' medial:    8.45 cm/s LV PW:         1.40 cm   LV E/e' medial:  9.8 LV IVS:        0.80 cm   LV e' lateral:   12.70 cm/s LVOT diam:     2.50 cm   LV E/e' lateral: 6.5 LV SV:         88 LV SV Index:   39 LVOT Area:     4.91 cm                           3D Volume EF:                          3D EF:        51 %                          LV EDV:       182 ml                          LV ESV:       89 ml                          LV SV:        92 ml RIGHT VENTRICLE RV S prime:     11.50 cm/s TAPSE (M-mode): 1.3 cm LEFT ATRIUM             Index        RIGHT ATRIUM           Index LA diam:        3.70 cm 1.64 cm/m   RA Area:     18.80 cm LA Vol (A2C):   65.1 ml 28.92 ml/m  RA Volume:   50.50 ml  22.43 ml/m LA Vol (A4C):   68.7 ml 30.52 ml/m LA Biplane Vol: 71.0 ml 31.54 ml/m  AORTIC VALVE             PULMONIC VALVE LVOT Vmax:   87.40 cm/s  PR End Diast Vel: 2.06 msec LVOT Vmean:  57.800 cm/s LVOT VTI:    0.180 m  AORTA Ao Root diam: 3.20 cm Ao Asc diam:  3.10 cm MITRAL VALVE               TRICUSPID VALVE MV Area (PHT): 2.91 cm    TR Peak grad:   5.3 mmHg MV Decel Time: 261 msec    TR Vmax:        115.00 cm/s MR Peak grad: 4.1 mmHg MR Vmax:      101.00 cm/s  SHUNTS MV E velocity: 83.10 cm/s  Systemic VTI:  0.18 m MV A velocity: 71.60 cm/s  Systemic Diam: 2.50 cm MV E/A ratio:  1.16 Vishnu Priya Mallipeddi Electronically signed by Lorelee Cover Mallipeddi Signature Date/Time: 01/08/2023/12:52:23 PM    Final    US Carotid Bilateral  Result Date: 01/08/2023 CLINICAL DATA:  [id number] Syncope [id number] EXAM: BILATERAL CAROTID DUPLEX ULTRASOUND TECHNIQUE: Pearline Cables scale imaging, color Doppler and duplex ultrasound  were performed  of bilateral carotid and vertebral arteries in the neck. COMPARISON:  None Available. FINDINGS: Criteria: Quantification of carotid stenosis is based on velocity parameters that correlate the residual internal carotid diameter with NASCET-based stenosis levels, using the diameter of the distal internal carotid lumen as the denominator for stenosis measurement. The following velocity measurements were obtained: RIGHT ICA: 116/54 cm/sec CCA: [id number] cm/sec SYSTOLIC ICA/CCA RATIO:  1.2 ECA: 115 cm/sec LEFT ICA: 116/49 cm/sec CCA: [id number] cm/sec SYSTOLIC ICA/CCA RATIO:  0.9 ECA: 97 cm/sec RIGHT CAROTID ARTERY: No significant atherosclerotic plaque. No stenosis by Doppler criteria. Normal low resistance waveforms of the internal carotid artery. RIGHT VERTEBRAL ARTERY:  Antegrade flow LEFT CAROTID ARTERY: No significant atherosclerotic plaque. No stenosis by Doppler criteria. Normal low resistance waveforms of the internal carotid artery. LEFT VERTEBRAL ARTERY:  Antegrade flow IMPRESSION: 1. No significant atherosclerotic plaque of either carotid circulation. No stenosis by Doppler criteria. 2. Bilateral vertebral arteries demonstrate normal antegrade flow. Electronically Signed   By: Albin Felling M.D.   On: 01/08/2023 10:47   MR BRAIN WO CONTRAST  Result Date: 01/08/2023 EXAM: MRI HEAD WITHOUT CONTRAST TECHNIQUE: Multiplanar, multiecho pulse sequences of the brain and surrounding structures were obtained without intravenous contrast. COMPARISON:  Head CT January 07, 2023. FINDINGS: Brain: No acute infarction, hemorrhage, hydrocephalus, extra-axial collection or mass lesion. Rare punctate foci of T2 hyperintensity are seen in the white matter of the bilateral frontal lobes, nonspecific. Partial empty sella. Vascular: Normal flow voids. Skull and upper cervical spine: Diffuse decrease of the T1 signal in the visualized osseous structures may be related to red marrow reconversion. Sinuses/Orbits: Negative. Other: None.  IMPRESSION: 1. No acute intracranial abnormality. 2. Rare punctate foci of T2 hyperintensity in the white matter of the bilateral frontal lobes, nonspecific. Differential diagnosis include migraine, demyelinating disease and post inflammatory/infectious processes. 3. Partial empty sella. This can be seen in the setting of idiopathic intracranial hypertension. 4. Diffuse decrease of the T1 signal in the visualized osseous structures may be related to red marrow reconversion in the setting of anemia. Electronically Signed   By: Pedro Earls M.D.   On: 01/08/2023 10:38   CT HEAD CODE STROKE WO CONTRAST  Result Date: 01/07/2023 CLINICAL DATA:  Code stroke.  Right-sided weakness EXAM: CT HEAD WITHOUT CONTRAST TECHNIQUE: Contiguous axial images were obtained from the base of the skull through the vertex without intravenous contrast. RADIATION DOSE REDUCTION: This exam was performed according to the departmental dose-optimization program which includes automated exposure control, adjustment of the mA and/or kV according to patient size and/or use of iterative reconstruction technique. COMPARISON:  01/31/2022 FINDINGS: Brain: There is no mass, hemorrhage or extra-axial collection. The size and configuration of the ventricles and extra-axial CSF spaces are normal. The brain parenchyma is normal, without evidence of acute or chronic infarction. Vascular: No abnormal hyperdensity of the major intracranial arteries or dural venous sinuses. No intracranial atherosclerosis. Skull: The visualized skull base, calvarium and extracranial soft tissues are normal. Sinuses/Orbits: No fluid levels or advanced mucosal thickening of the visualized paranasal sinuses. No mastoid or middle ear effusion. The orbits are normal. ASPECTS [facility] Stroke Program Early CT Score) - Ganglionic level infarction (caudate, lentiform nuclei, internal capsule, insula, M1-M3 cortex): 7 - Supraganglionic infarction (M4-M6 cortex): 3  Total score (0-10 with 10 being normal): 10 IMPRESSION: 1. No acute intracranial abnormality. 2. ASPECTS is 10. These results were called by telephone at the time of interpretation on 01/07/2023 at 9:38 pm to provider  HALEY SAGE , who verbally acknowledged these results. Electronically Signed   By: Ulyses Jarred M.D.   On: 01/07/2023 21:51   XR HIP UNILAT W OR W/O PELVIS 1V RIGHT  Result Date: 12/14/2022 Imaging AP pelvis demonstrates no degenerative changes of her hip.  Well-maintained alignment    Discharge Exam: Vitals:   01/08/23 1027 01/08/23 1223  BP: (!) 148/80 (!) 142/90  Pulse: 65 73  Resp: 18 18  Temp: 97.9 F (36.6 C) 98 F (36.7 C)  SpO2: 100% 100%   Vitals:   01/08/23 0527 01/08/23 0744 01/08/23 1027 01/08/23 1223  BP: 131/73 132/85 (!) 148/80 (!) 142/90  Pulse: 72 66 65 73  Resp: 13 (!) 21 18 18   Temp: 97.8 F (36.6 C) 98.1 F (36.7 C) 97.9 F (36.6 C) 98 F (36.7 C)  TempSrc: Oral Oral Oral Oral  SpO2: 99%  100% 100%  Weight:      Height:        General: Pt is alert, awake, not in acute distress Cardiovascular: RRR, S1/S2 +, no rubs, no gallops Respiratory: CTA bilaterally, no wheezing, no rhonchi Abdominal: Soft, NT, ND, bowel sounds + Extremities: no edema, no cyanosis Neuro: Cranial nerves intact.  4/5 power in right upper and lower extremity.  Sensation intact.   The results of significant diagnostics from this hospitalization (including imaging, microbiology, ancillary and laboratory) are listed below for reference.     Microbiology: No results found for this or any previous visit (from the past 240 hour(s)).   Labs: BNP (last 3 results) No results for input(s): "BNP" in the last 8760 hours. Basic Metabolic Panel: Recent Labs  Lab 01/07/23 2106 01/08/23 0446  NA 131* 133*  K 3.7 3.8  CL 99 102  CO2 24 21*  GLUCOSE 134* 112*  BUN 16 14  CREATININE 0.94 0.67  CALCIUM 8.6* 8.6*  MG  --  2.0   Liver Function Tests: Recent Labs  Lab  01/07/23 2106 01/08/23 0446  AST 21 14*  ALT 17 14  ALKPHOS 58 55  BILITOT 0.4 0.5  PROT 7.9 7.5  ALBUMIN 4.0 3.6   No results for input(s): "LIPASE", "AMYLASE" in the last 168 hours. No results for input(s): "AMMONIA" in the last 168 hours. CBC: Recent Labs  Lab 01/07/23 2106 01/08/23 0446  WBC 13.5* 11.9*  NEUTROABS 9.3*  --   HGB 8.5* 7.7*  HCT 28.9* 27.0*  MCV 75.5* 76.1*  PLT 489* 444*   Cardiac Enzymes: No results for input(s): "CKTOTAL", "CKMB", "CKMBINDEX", "TROPONINI" in the last 168 hours. BNP: Invalid input(s): "POCBNP" CBG: Recent Labs  Lab 01/07/23 2115  GLUCAP 122*   D-Dimer No results for input(s): "DDIMER" in the last 72 hours. Hgb A1c No results for input(s): "HGBA1C" in the last 72 hours. Lipid Profile No results for input(s): "CHOL", "HDL", "LDLCALC", "TRIG", "CHOLHDL", "LDLDIRECT" in the last 72 hours. Thyroid function studies Recent Labs    01/08/23 0446  TSH 1.353   Anemia work up No results for input(s): "VITAMINB12", "FOLATE", "FERRITIN", "TIBC", "IRON", "RETICCTPCT" in the last 72 hours. Urinalysis    Component Value Date/Time   COLORURINE YELLOW 01/07/2023 2347   APPEARANCEUR CLEAR 01/07/2023 2347   APPEARANCEUR Cloudy 10/25/2014 1428   LABSPEC 1.011 01/07/2023 2347   LABSPEC 1.025 10/25/2014 1428   PHURINE 6.0 01/07/2023 2347   GLUCOSEU NEGATIVE 01/07/2023 2347   GLUCOSEU Negative 10/25/2014 1428   HGBUR NEGATIVE 01/07/2023 2347   BILIRUBINUR NEGATIVE 01/07/2023 2347   BILIRUBINUR Negative  10/25/2014 [place] 01/07/2023 2347   PROTEINUR NEGATIVE 01/07/2023 2347   UROBILINOGEN 0.2 07/13/2021 1032   NITRITE NEGATIVE 01/07/2023 2347   LEUKOCYTESUR NEGATIVE 01/07/2023 2347   LEUKOCYTESUR 2+ 10/25/2014 1428   Sepsis Labs Recent Labs  Lab 01/07/23 2106 01/08/23 0446  WBC 13.5* 11.9*   Microbiology No results found for this or any previous visit (from the past 240 hour(s)).   Time coordinating  discharge: Over 30 minutes  SIGNED:   Darliss Cheney, MD  [facility] 01/08/2023, 1:49 PM *Please note that this is a verbal dictation therefore any spelling or grammatical errors are due to the "[place]" system interpretation. If 7PM-7AM, please contact night-coverage [email or web address]

## 2023-01-08 NOTE — Progress Notes (Signed)
2128 call time 2129 beeper time 2132 exam started 2132 exam finished 2133 images sent to soc 2134 exam completed in epic 2134 [facility] radiology called

## 2023-01-08 NOTE — Progress Notes (Signed)
  Echocardiogram 2D Echocardiogram has been performed.  Wynelle Link 01/08/2023, 10:29 AM

## 2023-01-08 NOTE — Assessment & Plan Note (Signed)
With blood cell count 13.5 - No infectious symptoms - Likely acute phase reactant - Trend in the a.m.

## 2023-01-08 NOTE — Assessment & Plan Note (Signed)
-   History of tachycardia - Continue metoprolol

## 2023-01-08 NOTE — Assessment & Plan Note (Signed)
Continue PPI ?

## 2023-01-08 NOTE — Telephone Encounter (Signed)
I called pt back. She is currently in the hospital after possible stroke on the right side. Stated she is having numbness. She will cb if still having problems

## 2023-01-08 NOTE — Assessment & Plan Note (Signed)
-   Continue Dulera, continue montelukast - Continue to monitor

## 2023-01-08 NOTE — Assessment & Plan Note (Signed)
-   Continue Cymbalta, continue gabapentin, continue the same for muscle spasms, continue to monitor

## 2023-01-08 NOTE — Evaluation (Signed)
Physical Therapy Evaluation Patient Details Name: Nancy Dickerson MRN: [id number] DOB: 07-22-1981 Today's Date: 01/08/2023  History of Present Illness  Nancy Dickerson is a 42 y.o. female with medical history significant of anemia, cardiomegaly, COPD, GERD, tinnitus, and more presents the ED with a chief complaint of collapse.  Patient reports that she collapsed from standing and her family called 52.  She reports that she remembers before the syncopal episode she was in the car and she was not feeling well.  She reports nausea but no vomiting.  She had lower abdominal pain.  She denies any chest pain, but did have palpitations.  She is not sure how long the palpitations lasted.  She denied shortness of breath and fever.  She had arrived at her destination, and had been there approximately 10 minutes when she was standing and collapsed to the ground.  She reports no preceding symptoms other than what ever already mention.  She reports her boyfriend caught her so she does not think she hit her head.  She is not sure how long she was out.  The next thing  She remembers is laying in the ER per her report.  Something similar happened to her in 2019.  At that time she was told she had a stroke per her report.  That time and at this time she had associated right-sided weakness.  She is not sure when the right-sided weakness started.  She reports she is still experiencing it.  She reports she had slurred speech after the syncopal event.  She is not sure when it resolved.  She reports dysphagia and describes it as feeling funny to swallow.  She did not notice any drooling.  She denies any paresthesias or numbness.  She has not had a "big headache, but has only had her normal headache.  She has no other complaints at this time.    Clinical Impression  Patient limited for functional mobility as stated below secondary to BLE weakness, fatigue and impaired standing balance. Patient with decreased RLE sensation and  strength when checked seated EOB. She requires use of RW and very minimal assist to transfer to standing but relies on LLE. She ambulates with very slow, labored cadence with several standing rest breaks while using RW. Patient able to ambulate to door and back to bed with fatigue limiting further mobility. Patient will benefit from a rolling walker to improve balance for safe ambulation at home and in the community. Patient will benefit from continued physical therapy in hospital and recommended venue below to increase strength, balance, endurance for safe ADLs and gait.        Recommendations for follow up therapy are one component of a multi-disciplinary discharge planning process, led by the attending physician.  Recommendations may be updated based on patient status, additional functional criteria and insurance authorization.  Follow Up Recommendations       Assistance Recommended at Discharge Intermittent Supervision/Assistance  Patient can return home with the following  A little help with walking and/or transfers;A little help with bathing/dressing/bathroom;Assistance with cooking/housework;Assist for transportation;Help with stairs or ramp for entrance    Equipment Recommendations Rolling walker (2 wheels)  Recommendations for Other Services       Functional Status Assessment Patient has had a recent decline in their functional status and demonstrates the ability to make significant improvements in function in a reasonable and predictable amount of time.     Precautions / Restrictions Precautions Precautions: Fall Restrictions Weight Bearing Restrictions: No  Mobility  Bed Mobility Overal bed mobility: Needs Assistance Bed Mobility: Supine to Sit, Sit to Supine     Supine to sit: Modified independent (Device/Increase time), HOB elevated Sit to supine: Modified independent (Device/Increase time), HOB elevated        Transfers Overall transfer level: Needs  assistance Equipment used: Rolling walker (2 wheels) Transfers: Sit to/from Stand Sit to Stand: Min guard, Min assist           General transfer comment: relies on LLE due to RLE deficit    Ambulation/Gait Ambulation/Gait assistance: Min guard Gait Distance (Feet): 25 Feet Assistive device: Rolling walker (2 wheels) Gait Pattern/deviations: Decreased stride length, Step-through pattern, Trunk flexed       General Gait Details: RLE held in extension with increased effort for advancement  Stairs            Wheelchair Mobility    Modified Rankin (Stroke Patients Only)       Balance Overall balance assessment: Needs assistance Sitting-balance support: No upper extremity supported, Feet supported Sitting balance-Leahy Scale: Good Sitting balance - Comments: seated EOB   Standing balance support: Bilateral upper extremity supported, Reliant on assistive device for balance Standing balance-Leahy Scale: Fair                               Pertinent Vitals/Pain Pain Assessment Pain Assessment: No/denies pain    Home Living Family/patient expects to be discharged to:: Private residence Living Arrangements: Children;Spouse/significant other Available Help at Discharge: Family Type of Home: House Home Access: Stairs to enter Entrance Stairs-Rails: Right Entrance Stairs-Number of Steps: 3   Home Layout: One level Home Equipment: None      Prior Function Prior Level of Function : Independent/Modified Independent             Mobility Comments: states community ambulation without AD ADLs Comments: states independent     Hand Dominance        Extremity/Trunk Assessment   Upper Extremity Assessment Upper Extremity Assessment: Defer to OT evaluation    Lower Extremity Assessment Lower Extremity Assessment: RLE deficits/detail RLE Deficits / Details: RLE grossly 4-/5 mmt RLE Sensation: decreased light touch    Cervical / Trunk  Assessment Cervical / Trunk Assessment: Normal  Communication   Communication: No difficulties  Cognition Arousal/Alertness: Awake/alert Behavior During Therapy: WFL for tasks assessed/performed Overall Cognitive Status: Within Functional Limits for tasks assessed                                          General Comments      Exercises     Assessment/Plan    PT Assessment Patient needs continued PT services  PT Problem List Decreased strength;Decreased balance;Decreased mobility;Decreased activity tolerance;Decreased coordination       PT Treatment Interventions DME instruction;Therapeutic exercise;Gait training;Balance training;Stair training;Neuromuscular re-education;Functional mobility training;Therapeutic activities    PT Goals (Current goals can be found in the Care Plan section)  Acute Rehab PT Goals Patient Stated Goal: Return home PT Goal Formulation: With patient Time For Goal Achievement: 01/15/23 Potential to Achieve Goals: Good    Frequency Min 3X/week     Co-evaluation               AM-PAC PT "6 Clicks" Mobility  Outcome Measure Help needed turning from your back to your side while in  a flat bed without using bedrails?: None Help needed moving from lying on your back to sitting on the side of a flat bed without using bedrails?: A Little Help needed moving to and from a bed to a chair (including a wheelchair)?: A Little Help needed standing up from a chair using your arms (e.g., wheelchair or bedside chair)?: A Little Help needed to walk in hospital room?: A Little Help needed climbing 3-5 steps with a railing? : A Lot 6 Click Score: 18    End of Session Equipment Utilized During Treatment: Gait belt Activity Tolerance: Patient tolerated treatment well;Patient limited by fatigue Patient left: in bed;with call bell/phone within reach Nurse Communication: Mobility status PT Visit Diagnosis: Unsteadiness on feet (R26.81);Other  abnormalities of gait and mobility (R26.89);Muscle weakness (generalized) (M62.81)    Time: [id number] PT Time Calculation (min) (ACUTE ONLY): 35 min   Charges:   PT Evaluation $PT Eval Low Complexity: 1 Low PT Treatments $Therapeutic Activity: 23-37 mins        2:22 PM, 01/08/23 Mearl Latin PT, DPT Physical Therapist at [facility]

## 2023-01-08 NOTE — Assessment & Plan Note (Signed)
-   Continue Valium for anxiety - Continue Cymbalta - Continue to monitor

## 2023-01-08 NOTE — TOC Transition Note (Signed)
Transition of Care [facility]) - CM/SW Discharge Note   Patient Details  Name: Nancy Dickerson MRN: [id number] Date of Birth: [birth year]  [facility] of Care [facility] CM/SW Contact:  Boneta Lucks, RN Phone Number: 01/08/2023, 2:54 PM   Clinical Narrative:   Patient discharging home. PT recommending out patient PT, Orders placed by MD. Patient needs a walker. She wants it shipped to the home. Referral sent to [facility] with [employer].    Final next level of care: Home/Self Care Barriers to Discharge: Barriers Resolved   Patient Goals and CMS Choice CMS Medicare.gov Compare Post Acute Care list provided to:: Patient Choice offered to / list presented to : Patient  Discharge Placement        Patient and family notified of of transfer: 01/08/23  Discharge Plan and Services Additional resources added to the After Visit Summary for                  DME Arranged: Gilford Rile DME Agency: [employer] Date DME Agency Contacted: 01/08/23 Time DME Agency Contacted: [id number] Representative spoke with at DME Agency: Erasmo Downer       Social Determinants of Health ([place]) Interventions SDOH Screenings   Food Insecurity: No Food Insecurity (10/06/2021)  Transportation Needs: No Transportation Needs (10/06/2021)  Depression (PHQ2-9): Low Risk  (10/20/2021)  Tobacco Use: Low Risk  (01/07/2023)

## 2023-01-09 ENCOUNTER — Encounter: Payer: Self-pay | Admitting: Hematology

## 2023-01-10 ENCOUNTER — Encounter: Payer: Self-pay | Admitting: Neurology

## 2023-01-10 ENCOUNTER — Telehealth: Payer: Self-pay

## 2023-01-10 DIAGNOSIS — D72829 Elevated white blood cell count, unspecified: Secondary | ICD-10-CM | POA: Diagnosis not present

## 2023-01-10 DIAGNOSIS — R55 Syncope and collapse: Secondary | ICD-10-CM | POA: Diagnosis not present

## 2023-01-10 DIAGNOSIS — G8929 Other chronic pain: Secondary | ICD-10-CM | POA: Diagnosis not present

## 2023-01-10 NOTE — Telephone Encounter (Signed)
Pt aware just waiting on auth from insurance

## 2023-01-10 NOTE — Telephone Encounter (Signed)
Pt called and stated she is home s/p stroke but wanted to know that status of her MRI of her hip. Can you please advise

## 2023-01-10 NOTE — Transitions of Care (Post Inpatient/ED Visit) (Signed)
   01/10/2023  Name: Nancy Dickerson MRN: [id number] DOB: 06/16/1981  Today's TOC FU Call Status: Today's TOC FU Call Status:: Successful TOC FU Call Competed Unsuccessful Call (1st Attempt) Date: 01/10/23 [facility] FU Call Complete Date: 01/10/23  Transition Care Management Follow-up Telephone Call Date of Discharge: 01/08/23 [name] Type of Discharge: Inpatient Admission Primary Inpatient Discharge Diagnosis:: Syncope How have you been since you were released from the hospital?: Better Any questions or concerns?: Yes Patient Questions/Concerns:: patient is requesting an order for a walking cane Patient Questions/Concerns Addressed: Notified Provider of Patient Questions/Concerns  Items Reviewed: Did you receive and understand the discharge instructions provided?: Yes Medications obtained and verified?: Yes (Medications Reviewed) Any new allergies since your discharge?: No Dietary orders reviewed?: NA Do you have support at home?: Yes People in Home: significant other  Home Care and Equipment/Supplies: [place] Ordered?: NA Any new equipment or medical supplies ordered?: NA  Functional Questionnaire: Do you need assistance with bathing/showering or dressing?: No Do you need assistance with meal preparation?: No Do you need assistance with eating?: No Do you have difficulty maintaining continence: No Do you need assistance with getting out of bed/getting out of a chair/moving?: No Do you have difficulty managing or taking your medications?: No  Follow up appointments reviewed: PCP Follow-up appointment confirmed?: No (no appotintments available, request forwarded to PCP for an appt) MD Provider Line [phone] Given: Yes [facility] Follow-up appointment confirmed?: Yes Date of Specialist follow-up appointment?: 01/30/23 Follow-Up Specialty Provider:: Dr. Posey Pronto Do you need transportation to your follow-up appointment?:  No Do you understand care options if your condition(s) worsen?: Yes-patient verbalized understanding    Norton Blizzard, [place] ([place])  [place] [phone]

## 2023-01-10 NOTE — Transitions of Care (Post Inpatient/ED Visit) (Signed)
   01/10/2023  Name: Nancy Dickerson MRN: [id number] DOB: 1981/09/23  Today's TOC FU Call Status: Today's TOC FU Call Status:: Unsuccessul Call (1st Attempt) Unsuccessful Call (1st Attempt) Date: 01/10/23  Attempted to reach the patient regarding the most recent Inpatient/ED visit.  Follow Up Plan: Additional outreach attempts will be made to reach the patient to complete the Transitions of Care (Post Inpatient/ED visit) call.   Norton Blizzard, [place] ([facility])  [place] [facility] [phone]

## 2023-01-15 ENCOUNTER — Ambulatory Visit: Payer: Medicaid Other

## 2023-01-16 ENCOUNTER — Ambulatory Visit (INDEPENDENT_AMBULATORY_CARE_PROVIDER_SITE_OTHER): Payer: Medicaid Other | Admitting: Psychiatry

## 2023-01-16 ENCOUNTER — Encounter (HOSPITAL_COMMUNITY): Payer: Self-pay | Admitting: Psychiatry

## 2023-01-16 VITALS — BP 141/91 | HR 107 | Wt 282.4 lb

## 2023-01-16 DIAGNOSIS — G4701 Insomnia due to medical condition: Secondary | ICD-10-CM

## 2023-01-16 DIAGNOSIS — R635 Abnormal weight gain: Secondary | ICD-10-CM | POA: Diagnosis not present

## 2023-01-16 DIAGNOSIS — F32A Depression, unspecified: Secondary | ICD-10-CM

## 2023-01-16 DIAGNOSIS — F411 Generalized anxiety disorder: Secondary | ICD-10-CM

## 2023-01-16 MED ORDER — BELSOMRA 10 MG PO TABS
10.0000 mg | ORAL_TABLET | Freq: Every evening | ORAL | 2 refills | Status: DC | PRN
Start: 2023-01-16 — End: 2023-01-25

## 2023-01-16 MED ORDER — BUPROPION HCL ER (XL) 150 MG PO TB24: 150.0000 mg | ORAL_TABLET | ORAL | 3 refills | Status: DC

## 2023-01-16 MED ORDER — GABAPENTIN 300 MG PO CAPS: 300.0000 mg | ORAL_CAPSULE | Freq: Three times a day (TID) | ORAL | 3 refills | Status: DC
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
## 2023-01-24 ENCOUNTER — Ambulatory Visit: Payer: Medicaid Other | Admitting: Orthopaedic Surgery

## 2023-01-25 ENCOUNTER — Telehealth (HOSPITAL_COMMUNITY): Payer: Self-pay | Admitting: *Deleted

## 2023-01-25 ENCOUNTER — Other Ambulatory Visit (HOSPITAL_COMMUNITY): Payer: Self-pay | Admitting: Psychiatry

## 2023-01-25 DIAGNOSIS — G4701 Insomnia due to medical condition: Secondary | ICD-10-CM

## 2023-01-25 MED ORDER — BELSOMRA 10 MG PO TABS: 10.0000 mg | ORAL_TABLET | Freq: Every evening | ORAL | 2 refills | Status: DC | PRN

## 2023-01-25 NOTE — Telephone Encounter (Signed)
Asking for a return call from her psychiatrist. Has a lot of anxiety and unable to get it under control. Had a stroke on Easter Sunday.
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
## 2023-01-30 ENCOUNTER — Ambulatory Visit: Payer: Medicaid Other | Admitting: Neurology

## 2023-01-30 ENCOUNTER — Encounter: Payer: Self-pay | Admitting: Neurology

## 2023-02-07 DIAGNOSIS — Z419 Encounter for procedure for purposes other than remedying health state, unspecified: Secondary | ICD-10-CM | POA: Diagnosis not present

## 2023-03-10 DIAGNOSIS — Z419 Encounter for procedure for purposes other than remedying health state, unspecified: Secondary | ICD-10-CM | POA: Diagnosis not present

## 2023-03-11 ENCOUNTER — Ambulatory Visit
Admission: EM | Admit: 2023-03-11 | Discharge: 2023-03-11 | Disposition: A | Discharge status: Home / Self Care | Payer: Medicaid Other | Attending: Internal Medicine | Admitting: Internal Medicine

## 2023-03-11 ENCOUNTER — Ambulatory Visit (INDEPENDENT_AMBULATORY_CARE_PROVIDER_SITE_OTHER): Payer: Medicaid Other

## 2023-03-11 DIAGNOSIS — M25561 Pain in right knee: Secondary | ICD-10-CM | POA: Diagnosis not present

## 2023-03-11 DIAGNOSIS — M1711 Unilateral primary osteoarthritis, right knee: Secondary | ICD-10-CM | POA: Diagnosis not present

## 2023-03-11 NOTE — ED Provider Notes (Signed)
[name]    CSN: [id number] Arrival date & time: 03/11/23  0935      History   Chief Complaint Chief Complaint  Patient presents with   Knee Pain    HPI Nancy Dickerson is a 42 y.o. female.   Patient presents with right knee pain that started recently that seems to be worsening.  Reports that it started after she stood up quickly from a chair at the table.  Reports that she has been hearing a popping sound when she moves and walks.  She has taken NSAIDs with minimal improvement in pain.  Patient not reporting any numbness or tingling.  Denies history of chronic knee pain in the right knee.  She is followed by orthopedics for hip pain.   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   Chest congestion 07/15/2021   Acute cough 07/15/2021   Tachycardia 06/01/2021   SOB (shortness of breath) 06/01/2021   Attention deficit hyperactivity disorder (ADHD), combined type 01/13/2021   Chronic obstructive pulmonary disease (HCC) 11/26/2020   Moderate episode of recurrent major depressive disorder (HCC) 10/14/2020   Generalized anxiety disorder 10/14/2020   Dysphonia 08/25/2020   GERD (gastroesophageal reflux disease) 08/25/2020   Pulsatile tinnitus of right ear 08/25/2020   Dysfunctional uterine bleeding 06/03/2020   Patellofemoral pain syndrome of left knee 11/08/2019   Acute pain of left knee 08/13/2019   Insomnia 07/07/2019   Lumbar radicular pain 07/07/2019   Uterine leiomyoma 03/12/2018   Trochanteric  bursitis of right hip 03/12/2018   Class 3 severe obesity due to excess calories without serious comorbidity with body mass index (BMI) of 40.0 to 44.9 in adult (HCC) 03/12/2018   Iron deficiency anemia due to chronic blood loss 01/29/2018   Right sided weakness 01/29/2018    Past Surgical History:  Procedure Laterality Date   CERVICAL CONIZATION W/BX N/A 06/25/2019   Procedure: CONIZATION CERVIX WITH BIOPSY - COLD KNIFE;  Surgeon: Allie Bossier, MD;  Location: [facility];  Service: Gynecology;  Laterality: N/A;   ENDOMETRIAL ABLATION N/A 06/25/2019   Procedure: MINERVA ABLATION;  Surgeon: Allie Bossier, MD;  Location: [facility];  Service: Gynecology;  Laterality: N/A;  Minerva rep will here confirmed on 06/20/19 CS   HYSTEROSCOPY WITH D & C  06/25/2019   Procedure: DILATATION AND CURETTAGE /HYSTEROSCOPY;  Surgeon: Allie Bossier, MD;  Location: [facility];  Service: Gynecology;;   NO PAST SURGERIES      OB History     Gravida  7   Para  1   Term  1   Preterm      AB  1   Living  6      SAB  1   IAB      Ectopic      Multiple      Live Births  Home Medications    Prior to Admission medications   Medication Sig Start Date End Date Taking? Authorizing Provider  albuterol (PROVENTIL) (2.5 MG/3ML) 0.083% nebulizer solution 2.5 mg every 6 (six) hours as needed. Patient taking differently: Take 2.5 mg by nebulization every 6 (six) hours as needed for wheezing or shortness of breath. 2.5 mg every 6 (six) hours as needed. 07/15/21  Yes Ivonne Andrew, NP  albuterol (VENTOLIN HFA) 108 (90 Base) MCG/ACT inhaler Inhale 2 puffs into the lungs every 6 (six) hours as needed for wheezing or shortness of breath. 09/08/21  Yes Barbette Merino, NP  atorvastatin (LIPITOR) 20 MG tablet Take 1 tablet (20 mg total) by mouth daily. 01/09/23 03/11/23 Yes Pahwani, Daleen Bo, MD  buPROPion (WELLBUTRIN XL) 150 MG 24 hr tablet Take 1 tablet (150  mg total) by mouth every morning. 01/16/23  Yes Toy Cookey E, NP  gabapentin (NEURONTIN) 300 MG capsule Take 1 capsule (300 mg total) by mouth 3 (three) times daily. 01/16/23  Yes Toy Cookey E, NP  aspirin EC 81 MG tablet Take 1 tablet (81 mg total) by mouth daily. Swallow whole. 01/09/23   Hughie Closs, MD  Suvorexant (BELSOMRA) 10 MG TABS Take 1 tablet (10 mg total) by mouth at bedtime as needed. 01/25/23   Shanna Cisco, NP    Family History Family History  Problem Relation Age of Onset   Sudden death Father 14   Asthma Sister    Asthma Son     Social History Social History   Tobacco Use   Smoking status: Never   Smokeless tobacco: Never  Vaping Use   Vaping Use: Never used  Substance Use Topics   Alcohol use: Yes    Comment: rare   Drug use: Yes    Types: Marijuana    Comment: Occasionally     Allergies   Patient has no known allergies.   Review of Systems Review of Systems Per HPI  Physical Exam Triage Vital Signs ED Triage Vitals  Enc Vitals Group     BP 03/11/23 0945 (!) 135/90     Pulse Rate 03/11/23 0945 78     Resp 03/11/23 0945 20     Temp 03/11/23 0945 98 F (36.7 C)     Temp Source 03/11/23 0945 Oral     SpO2 03/11/23 0945 100 %     Weight 03/11/23 0942 280 lb (127 kg)     Height 03/11/23 0942 5\' 4"  (1.626 m)     Head Circumference --      Peak Flow --      Pain Score 03/11/23 0942 8     Pain Loc --      Pain Edu? --      Excl. in GC? --    No data found.  Updated Vital Signs BP (!) 135/90 (BP Location: Left Arm)   Pulse 78   Temp 98 F (36.7 C) (Oral)   Resp 20   Ht 5\' 4"  (1.626 m)   Wt 280 lb (127 kg)   LMP 03/09/2023 (Exact Date)   SpO2 100%   BMI 48.06 kg/m   Visual Acuity Right Eye Distance:   Left Eye Distance:   Bilateral Distance:    Right Eye Near:   Left Eye Near:    Bilateral Near:     Physical Exam Constitutional:      General: She is not in acute distress.    Appearance: Normal appearance.  She is not toxic-appearing or  diaphoretic.  HENT:     Head: Normocephalic and atraumatic.  Eyes:     Extraocular Movements: Extraocular movements intact.     Conjunctiva/sclera: Conjunctivae normal.  Pulmonary:     Effort: Pulmonary effort is normal.  Musculoskeletal:     Right knee: No LCL laxity, MCL laxity, ACL laxity or PCL laxity. Normal alignment. Normal pulse.     Comments: Patient has tenderness to palpation to medial and lateral right knee.  No obvious direct patellar tenderness.  Minimal swelling noted.  No discoloration or warmth.  Patient has crepitus and popping sensation with range of motion of knee.  Neurovascularly intact.  Neurological:     General: No focal deficit present.     Mental Status: She is alert and oriented to person, place, and time. Mental status is at baseline.  Psychiatric:        Mood and Affect: Mood normal.        Behavior: Behavior normal.        Thought Content: Thought content normal.        Judgment: Judgment normal.      UC Treatments / Results  Labs (all labs ordered are listed, but only abnormal results are displayed) Labs Reviewed - No data to display  EKG   Radiology DG Knee Complete 4 Views Right  Result Date: 03/11/2023 CLINICAL DATA:  Right knee pain after jumping off a bar stool 2 days ago. EXAM: RIGHT KNEE - COMPLETE 4+ VIEW COMPARISON:  None Available. FINDINGS: No fracture.  No bone lesion. Mild medial joint space compartment narrowing with associated small marginal osteophytes. No other degenerative change. Small joint effusion. Surrounding soft tissues are unremarkable. IMPRESSION: 1. No fracture or acute finding. 2. Mild medial joint space compartment osteoarthritis. Small joint effusion. Electronically Signed   By: Amie Portland M.D.   On: 03/11/2023 10:20    Procedures Procedures (including critical care time)  Medications Ordered in UC Medications - No data to display  Initial Impression / Assessment and Plan / UC  Course  I have reviewed the triage vital signs and the nursing notes.  Pertinent labs & imaging results that were available during my care of the patient were reviewed by me and considered in my medical decision making (see chart for details).     Right knee x-ray is showing osteoarthritis and a small joint effusion.  Offered patient prednisone but she declined this.  Knee brace applied in urgent care by clinical staff.  Advised patient to follow-up with her established orthopedist for further evaluation and management.  Patient verbalized understanding and was agreeable with plan. Final Clinical Impressions(s) / UC Diagnoses   Final diagnoses:  Acute pain of right knee     Discharge Instructions      Please follow-up with your orthopedist for further evaluation and management.     ED Prescriptions   None    PDMP not reviewed this encounter.   Gustavus Bryant, [place] 03/11/23 1047

## 2023-03-11 NOTE — ED Triage Notes (Signed)
"  I am having right knee pain ever since getting up at table recently and pain is increasing". No previous injury to that knee or arthritis known "in that knee". ? Swelling.

## 2023-03-11 NOTE — Discharge Instructions (Signed)
Please follow-up with your orthopedist for further evaluation and management.

## 2023-03-14 ENCOUNTER — Encounter: Payer: Self-pay | Admitting: Hematology

## 2023-03-28 ENCOUNTER — Telehealth (HOSPITAL_COMMUNITY): Payer: Self-pay | Admitting: *Deleted

## 2023-03-28 ENCOUNTER — Other Ambulatory Visit (HOSPITAL_COMMUNITY): Payer: Self-pay | Admitting: Psychiatry

## 2023-03-28 ENCOUNTER — Telehealth (INDEPENDENT_AMBULATORY_CARE_PROVIDER_SITE_OTHER): Payer: Medicaid Other | Admitting: Psychiatry

## 2023-03-28 ENCOUNTER — Encounter (HOSPITAL_COMMUNITY): Payer: Self-pay | Admitting: Psychiatry

## 2023-03-28 DIAGNOSIS — F32A Depression, unspecified: Secondary | ICD-10-CM | POA: Diagnosis not present

## 2023-03-28 DIAGNOSIS — F411 Generalized anxiety disorder: Secondary | ICD-10-CM

## 2023-03-28 DIAGNOSIS — G4701 Insomnia due to medical condition: Secondary | ICD-10-CM

## 2023-03-28 MED ORDER — BELSOMRA 10 MG PO TABS: 10.0000 mg | ORAL_TABLET | Freq: Every evening | ORAL | 2 refills | Status: DC | PRN

## 2023-03-28 MED ORDER — BUPROPION HCL ER (XL) 150 MG PO TB24
150.0000 mg | ORAL_TABLET | ORAL | 3 refills | Status: DC
Start: 2023-03-28 — End: 2023-06-20

## 2023-03-28 MED ORDER — GABAPENTIN 300 MG PO CAPS
300.0000 mg | ORAL_CAPSULE | Freq: Three times a day (TID) | ORAL | 3 refills | Status: DC
Start: 2023-03-28 — End: 2023-06-20

## 2023-03-28 NOTE — Telephone Encounter (Signed)
Fax received for prior authorization of Belsomra, Submitted online with cover my meds. Awaiting decision.

## 2023-03-28 NOTE — Telephone Encounter (Signed)
Denial for Belsomra received. States that it exceeds the quantity limit for sedative hypnotics of 15 units per month.

## 2023-03-28 NOTE — Telephone Encounter (Signed)
Provider reordered medication and sent a quantity of 15 tablets to preferred pharmacy.

## 2023-03-28 NOTE — Progress Notes (Signed)
BH MD/PA/NP OP Progress Note Virtual Visit via Video Note  I connected with Nancy Dickerson on 08/17/2021 at 10:30 AM EDT by a video enabled telemedicine application and verified that I am speaking with the correct person using two identifiers.  Location: Patient: Home Provider: Clinic   I discussed the limitations of evaluation and management by telemedicine and the availability of in person appointments. The patient expressed understanding and agreed to proceed.  I provided 30 minutes of non-face-to-face time during this encounter.    03/28/2023 10:57 AM Nancy Dickerson  MRN:  [id number]  Chief Complaint:  "I have been gaining weight"  HPI: 42 year old female seen today for follow up psychiatric evaluation.  She has psychiatric history of anxiety, depression, and insomnia. She is currently managed on gabapentin 300 mg 3 times daily, Belsomra 10 mg nightly, and Wellbutrin 150 mg daily. She notes that  her medications are somewhat effective in managing her psychiatric conditions.  Today, patient was well groomed, pleasant, cooperative, engaged in conversation and maintained eye contact. She informed provider she has been gaining weight.  She informed Clinical research associate that she believes it is her gabapentin.  Provider informed patient that gabapentin generally does not cause weight gain.  She endorsed understanding and notes that it was effective in the past for her anxiety and her pain.  She informed Clinical research associate that she will consider restarting it as at times she has pain in her legs.  Since her last visit she notes that her anxiety and depression continues to be manageable.  Today provider conducted a GAD-7 and patient scored an 8, at her last visit she scored a 7.  Provider also conducted PHQ-9 patient scored a 5, at her last visit she scored a 13.  Patient notes that her sleep continues to be problematic.  She informed Clinical research associate that at times she sleeps less than 5 hours and other days she sleeps 7 hours.  Today  she denies SI/HI/VAH, mania, or paranoia.    Patient informed Clinical research associate that she is proud of her son as he recently graduated high school.  She notes that she and her older daughter continue to be at odds but notes that her younger daughter is doing well.  Patient also informed Clinical research associate that she has been stressing because she cannot find a job.  She notes that she goes to work with her boyfriend to help out.    At this time patient reports that she is able to cope with above.  She notes that she has not started Belsomra as it needs a prior authorization.  Nursing staff notified to complete prior authorization.  No medication changes made today.  Patient agreeable taking medications as prescribed.  No other concerns noted at this time.      Visit Diagnosis:    ICD-10-CM   1. 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 Generalized anxiety disorder  F41.1 gabapentin (NEURONTIN) 300 MG capsule      Past Psychiatric History: Anxiety, Depression, and insomnia    Past Medical History:  Past Medical History:  Diagnosis Date   Anemia    Asthma    Bursitis of hip    Cardiomegaly    Chondromalacia of hip    Chondromalacia of right knee    COPD (chronic obstructive pulmonary disease) (HCC)    GERD (gastroesophageal reflux disease)  H/O transfusion of packed red blood cells    Tinnitus     Past Surgical History:  Procedure Laterality Date   CERVICAL CONIZATION W/BX N/A 06/25/2019   Procedure: CONIZATION CERVIX WITH BIOPSY - COLD KNIFE;  Surgeon: Allie Bossier, MD;  Location: [facility];  Service: Gynecology;  Laterality: N/A;   ENDOMETRIAL ABLATION N/A 06/25/2019   Procedure: MINERVA ABLATION;  Surgeon: Allie Bossier, MD;  Location: [facility];  Service: Gynecology;  Laterality: N/A;  Minerva rep will here confirmed on 06/20/19 CS   HYSTEROSCOPY WITH D & C   06/25/2019   Procedure: DILATATION AND CURETTAGE /HYSTEROSCOPY;  Surgeon: Allie Bossier, MD;  Location: [facility];  Service: Gynecology;;   NO PAST SURGERIES      Family Psychiatric History: Daughter SA, Mother Bipolar disorder, and sister Bipolar disorder  Family History:  Family History  Problem Relation Age of Onset   Sudden death Father 65   Asthma Sister    Asthma Son     Social History:  Social History   Socioeconomic History   Marital status: Single    Spouse name: Not on file   Number of children: 6   Years of education: 12   Highest education level: Not on file  Occupational History   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  Tobacco Use   Smoking status: Never   Smokeless tobacco: Never  Vaping Use   Vaping Use: Never used  Substance and Sexual Activity   Alcohol use: Yes    Comment: rare   Drug use: Yes    Types: Marijuana    Comment: Occasionally   Sexual activity: Yes  Other Topics Concern   Not on file  Social History Narrative   ** Merged History Encounter **         Lives with someone   Right handed    Little caffeine - drinks Sprite   Education - some college          Social Determinants of Health   Financial Resource Strain: Not on file  Food Insecurity: No Food Insecurity (10/06/2021)   Hunger Vital Sign    Worried About Running Out of Food in the Last Year: Never true    Ran Out of Food in the Last Year: Never true  Transportation Needs: No Transportation Needs (10/06/2021)   PRAPARE - Administrator, Civil Service (Medical): No    Lack of Transportation (Non-Medical): No  Physical Activity: Not on file  Stress: Not on file  Social Connections: Not on file    Allergies: No Known Allergies  Metabolic Disorder Labs: Lab Results  Component Value Date   HGBA1C 5.6 01/08/2023   MPG 114 01/08/2023   MPG 99.67 01/30/2018   No results found for: "PROLACTIN" Lab Results  Component Value Date   CHOL 157 09/08/2021   TRIG 83  09/08/2021   HDL 50 09/08/2021   CHOLHDL 3.1 09/08/2021   VLDL 18 01/30/2018   LDLCALC 91 09/08/2021   LDLCALC 58 01/30/2018   Lab Results  Component Value Date   TSH 1.353 01/08/2023   TSH 0.665 09/08/2021    Therapeutic Level Labs: No results found for: "LITHIUM" No results found for: "VALPROATE" No results found for: "CBMZ"  Current Medications: Current Outpatient Medications  Medication Sig Dispense Refill   albuterol (PROVENTIL) (2.5 MG/3ML) 0.083% nebulizer solution 2.5 mg every 6 (six) hours as needed. (Patient taking differently: Take 2.5 mg by nebulization every 6 (  six) hours as needed for wheezing or shortness of breath. 2.5 mg every 6 (six) hours as needed.) 75 mL 1   albuterol (VENTOLIN HFA) 108 (90 Base) MCG/ACT inhaler Inhale 2 puffs into the lungs every 6 (six) hours as needed for wheezing or shortness of breath. 8 g 2   aspirin EC 81 MG tablet Take 1 tablet (81 mg total) by mouth daily. Swallow whole. 30 tablet 12   atorvastatin (LIPITOR) 20 MG tablet Take 1 tablet (20 mg total) by mouth daily. 30 tablet 0   buPROPion (WELLBUTRIN XL) 150 MG 24 hr tablet Take 1 tablet (150 mg total) by mouth every morning. 30 tablet 3   gabapentin (NEURONTIN) 300 MG capsule Take 1 capsule (300 mg total) by mouth 3 (three) times daily. 90 capsule 3   Suvorexant (BELSOMRA) 10 MG TABS Take 1 tablet (10 mg total) by mouth at bedtime as needed. 30 tablet 2   No current facility-administered medications for this visit.     Musculoskeletal: Strength & Muscle Tone: within normal limits and  telehealth visit Gait & Station: normal, telehealth visit Patient leans: N/A  Psychiatric Specialty Exam: Review of Systems  Last menstrual period 03/09/2023.There is no height or weight on file to calculate BMI.  General Appearance: Well Groomed  Eye Contact:  Good  Speech:  Clear and Coherent and Normal Rate  Volume:  Normal  Mood:  Euthymic  Affect:  Appropriate and Congruent  Thought  Process:  Coherent, Goal Directed and Linear  Orientation:  Full (Time, Place, and Person)  Thought Content: WDL and Logical   Suicidal Thoughts:  No  Homicidal Thoughts:  No  Memory:  Immediate;   Good Recent;   Good Remote;   Good  Judgement:  Good  Insight:  Good  Psychomotor Activity:  Normal  Concentration:  Concentration: Good and Attention Span: Good  Recall:  Good  Fund of Knowledge: Good  Language: Good  Akathisia:  No  Handed:  Right  AIMS (if indicated): Not done  Assets:  Communication Skills Desire for Improvement Financial Resources/Insurance Housing Intimacy Physical Health Social Support  ADL's:  Intact  Cognition: WNL  Sleep:  Fair   Screenings: GAD-7    Flowsheet Row Video Visit from 03/28/2023 in [facility] Office Visit from 01/16/2023 in [facility] Video Visit from 10/20/2021 in [facility] Procedure visit from 10/06/[facility] in [facility] Office Visit from 06/23/2021 in [facility]  Total GAD-7 Score 8 7 11  0 9      PHQ2-9    Flowsheet Row Video Visit from 03/28/2023 in [facility] Most recent reading at 03/28/2023 10:38 AM Office Visit from 01/16/2023 in [facility] Most recent reading at 01/16/2023  1:20 PM Office Visit from 10/20/2021 in [facility] Most recent reading at 10/20/2021  3:11 PM Video Visit from 10/20/2021 in [facility] Most recent reading at 10/20/2021  2:33 PM Procedure visit from 10/06/2021 in [facility] Most recent reading at 10/06/2021  9:48 AM  PHQ-2 Total Score 2 2 0 4 0  PHQ-9 Total Score 5 13 -- 12 0      Flowsheet Row Video Visit from 03/28/2023 in [facility]  ED from 03/11/2023 in [facility]  at [facility] Office Visit from 01/16/2023 in [facility]  C-SSRS RISK CATEGORY No Risk No Risk No Risk        Assessment and Plan: Patient notes that she has been gaining weight and reports that her sleep continues to be problematic.  Patient attributed weight gain to gabapentin.  Provider informed patient that gabapentin generally does not cause weight gain.  She reports that she will restart it as it helps with her anxiety and pain.  She notes that she has not started Belsomra as it needs a prior authorization.  Nursing staff notified to complete prior authorization.  No medication changes made today.  Patient agreeable taking medications as prescribed.     1. Mild depression  Continue- buPROPion (WELLBUTRIN XL) 150 MG 24 hr tablet; Take 1 tablet (150 mg total) by mouth every morning.  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  Dispense: 30 tablet; Refill: 2  3. Generalized anxiety disorder  Continue- gabapentin (NEURONTIN) 300 MG capsule; Take 1 capsule (300 mg total) by mouth 3 (three) times daily.  Dispense: 90 capsule; Refill: 3   Follow up in 3 months  Shanna Cisco, NP 03/28/2023, 10:57 AM

## 2023-04-09 DIAGNOSIS — Z419 Encounter for procedure for purposes other than remedying health state, unspecified: Secondary | ICD-10-CM | POA: Diagnosis not present

## 2023-05-10 DIAGNOSIS — Z419 Encounter for procedure for purposes other than remedying health state, unspecified: Secondary | ICD-10-CM | POA: Diagnosis not present

## 2023-05-15 ENCOUNTER — Ambulatory Visit (INDEPENDENT_AMBULATORY_CARE_PROVIDER_SITE_OTHER): Payer: Medicaid Other

## 2023-05-15 ENCOUNTER — Encounter: Payer: Self-pay | Admitting: Emergency Medicine

## 2023-05-15 ENCOUNTER — Other Ambulatory Visit: Payer: Self-pay

## 2023-05-15 ENCOUNTER — Ambulatory Visit
Admission: EM | Admit: 2023-05-15 | Discharge: 2023-05-15 | Disposition: A | Discharge status: Home / Self Care | Payer: Medicaid Other | Attending: Internal Medicine | Admitting: Internal Medicine

## 2023-05-15 DIAGNOSIS — R1032 Left lower quadrant pain: Secondary | ICD-10-CM | POA: Diagnosis not present

## 2023-05-15 DIAGNOSIS — M545 Low back pain, unspecified: Secondary | ICD-10-CM | POA: Diagnosis not present

## 2023-05-15 DIAGNOSIS — N200 Calculus of kidney: Secondary | ICD-10-CM | POA: Diagnosis not present

## 2023-05-15 DIAGNOSIS — Z3202 Encounter for pregnancy test, result negative: Secondary | ICD-10-CM

## 2023-05-15 DIAGNOSIS — K5900 Constipation, unspecified: Secondary | ICD-10-CM | POA: Diagnosis not present

## 2023-05-15 DIAGNOSIS — R109 Unspecified abdominal pain: Secondary | ICD-10-CM | POA: Diagnosis not present

## 2023-05-15 LAB — POCT URINALYSIS DIP (MANUAL ENTRY)
Bilirubin, UA: NEGATIVE
Blood, UA: NEGATIVE
Glucose, UA: NEGATIVE mg/dL
Ketones, POC UA: NEGATIVE mg/dL
Leukocytes, UA: NEGATIVE
Nitrite, UA: NEGATIVE
Protein Ur, POC: NEGATIVE mg/dL
Spec Grav, UA: 1.025 (ref 1.010–1.025)
Urobilinogen, UA: 0.2 E.U./dL
pH, UA: 6.5 (ref 5.0–8.0)

## 2023-05-15 LAB — POCT URINE PREGNANCY: Preg Test, Ur: NEGATIVE

## 2023-05-15 MED ORDER — METHOCARBAMOL 500 MG PO TABS: 500.0000 mg | ORAL_TABLET | Freq: Two times a day (BID) | ORAL | 0 refills | Status: AC | PRN

## 2023-05-15 MED ORDER — POLYETHYLENE GLYCOL 3350 17 G PO PACK: 17.0000 g | PACK | Freq: Every day | ORAL | 0 refills | Status: AC

## 2023-05-15 NOTE — ED Provider Notes (Signed)
[name]    CSN: [id number] Arrival date & time: 05/15/23  1419      History   Chief Complaint Chief Complaint  Patient presents with   Back Pain    HPI Nancy Dickerson is a 42 y.o. female.   Patient presents with left lower back pain that started today.  Denies any obvious injury to the area.  Reports that she feels "a knot" in the area of pain.  Reports that she does have chronic pain on the right side but typically does not have issues with the left side.  Pain is not radiating down leg but does radiate around to left lower abdomen.  Reports that she has a history of kidney stones.  She states that she feels "pressure" when she urinates in the area of her flank.  Otherwise, denies dysuria, urinary frequency, vaginal discharge, hematuria.  Patient also reporting that she has been having hard bowel movements recently.  Denies blood in stool, nausea, vomiting.  Reports last bowel movement was today.  Denies fever, body aches, chills.  Last menstrual cycle was in June but the patient reports this is baseline her for her.  She had a procedure where wires were inserted to her fallopian tubes for birth control which was approximately 14 years ago.   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   Chest congestion 07/15/2021   Acute cough 07/15/2021   Tachycardia 06/01/2021   SOB (shortness of breath) 06/01/2021   Attention deficit hyperactivity disorder (ADHD), combined type 01/13/2021   Chronic obstructive pulmonary  disease (HCC) 11/26/2020   Moderate episode of recurrent major depressive disorder (HCC) 10/14/2020   Generalized anxiety disorder 10/14/2020   Dysphonia 08/25/2020   GERD (gastroesophageal reflux disease) 08/25/2020   Pulsatile tinnitus of right ear 08/25/2020   Dysfunctional uterine bleeding 06/03/2020   Patellofemoral pain syndrome of left knee 11/08/2019   Acute pain of left knee 08/13/2019   Insomnia 07/07/2019   Lumbar radicular pain 07/07/2019   Uterine leiomyoma 03/12/2018   Trochanteric bursitis of right hip 03/12/2018   Class 3 severe obesity due to excess calories without serious comorbidity with body mass index (BMI) of 40.0 to 44.9 in adult (HCC) 03/12/2018   Iron deficiency anemia due to chronic blood loss 01/29/2018   Right sided weakness 01/29/2018    Past Surgical History:  Procedure Laterality Date   CERVICAL CONIZATION W/BX N/A 06/25/2019   Procedure: CONIZATION CERVIX WITH BIOPSY - COLD KNIFE;  Surgeon: Allie Bossier, MD;  Location: [facility];  Service: Gynecology;  Laterality: N/A;   ENDOMETRIAL ABLATION N/A 06/25/2019   Procedure: MINERVA ABLATION;  Surgeon: Allie Bossier, MD;  Location: [facility];  Service: Gynecology;  Laterality: N/A;  [employer] rep will here confirmed on 06/20/19 CS   HYSTEROSCOPY WITH D & C  06/25/2019   Procedure: DILATATION AND CURETTAGE /HYSTEROSCOPY;  Surgeon: Allie Bossier,  MD;  Location: [facility];  Service: Gynecology;;   NO PAST SURGERIES      OB History     Gravida  7   Para  1   Term  1   Preterm      AB  1   Living  6      SAB  1   IAB      Ectopic      Multiple      Live Births               Home Medications    Prior to Admission medications   Medication Sig Start Date End Date Taking? Authorizing Provider  methocarbamol (ROBAXIN) 500 MG tablet Take 1 tablet (500 mg total) by mouth 2 (two) times daily as needed for muscle spasms. 05/15/23  Yes , Rolly Salter E,  FNP  polyethylene glycol (MIRALAX) 17 g packet Take 17 g by mouth daily. 05/15/23  Yes , Rolly Salter E, FNP  albuterol (PROVENTIL) (2.5 MG/3ML) 0.083% nebulizer solution 2.5 mg every 6 (six) hours as needed. Patient taking differently: Take 2.5 mg by nebulization every 6 (six) hours as needed for wheezing or shortness of breath. 2.5 mg every 6 (six) hours as needed. 07/15/21   Ivonne Andrew, NP  albuterol (VENTOLIN HFA) 108 (90 Base) MCG/ACT inhaler Inhale 2 puffs into the lungs every 6 (six) hours as needed for wheezing or shortness of breath. 09/08/21   Barbette Merino, NP  aspirin EC 81 MG tablet Take 1 tablet (81 mg total) by mouth daily. Swallow whole. 01/09/23   Hughie Closs, MD  atorvastatin (LIPITOR) 20 MG tablet Take 1 tablet (20 mg total) by mouth daily. 01/09/23 03/11/23  Hughie Closs, MD  buPROPion (WELLBUTRIN XL) 150 MG 24 hr tablet Take 1 tablet (150 mg total) by mouth every morning. 03/28/23   Shanna Cisco, NP  gabapentin (NEURONTIN) 300 MG capsule Take 1 capsule (300 mg total) by mouth 3 (three) times daily. 03/28/23   Shanna Cisco, NP  Suvorexant (BELSOMRA) 10 MG TABS Take 1 tablet (10 mg total) by mouth at bedtime as needed. 03/28/23   Shanna Cisco, NP    Family History Family History  Problem Relation Age of Onset   Sudden death Father 59   Asthma Sister    Asthma Son     Social History Social History   Tobacco Use   Smoking status: Never   Smokeless tobacco: Never  Vaping Use   Vaping status: Never Used  Substance Use Topics   Alcohol use: Yes    Comment: rare   Drug use: Yes    Types: Marijuana    Comment: Occasionally     Allergies   Patient has no known allergies.   Review of Systems Review of Systems Per HPI  Physical Exam Triage Vital Signs ED Triage Vitals  Encounter Vitals Group     BP 05/15/23 1441 (!) 140/82     Systolic BP Percentile --      Diastolic BP Percentile --      Pulse Rate 05/15/23 1441 79     Resp 05/15/23 1441  18     Temp 05/15/23 1441 97.9 F (36.6 C)     Temp Source 05/15/23 1441 Oral     SpO2 05/15/23 1441 98 %     Weight --      Height --      Head Circumference --      Peak Flow --  Pain Score 05/15/23 1442 6     Pain Loc --      Pain Education --      Exclude from Growth Chart --    No data found.  Updated Vital Signs BP (!) 140/82 (BP Location: Left Arm)   Pulse 79   Temp 97.9 F (36.6 C) (Oral)   Resp 18   SpO2 98%   Visual Acuity Right Eye Distance:   Left Eye Distance:   Bilateral Distance:    Right Eye Near:   Left Eye Near:    Bilateral Near:     Physical Exam Constitutional:      General: She is not in acute distress.    Appearance: Normal appearance. She is not toxic-appearing or diaphoretic.  HENT:     Head: Normocephalic and atraumatic.  Eyes:     Extraocular Movements: Extraocular movements intact.     Conjunctiva/sclera: Conjunctivae normal.  Pulmonary:     Effort: Pulmonary effort is normal.  Abdominal:     General: Bowel sounds are normal. There is no distension.     Palpations: Abdomen is soft.     Tenderness: There is abdominal tenderness in the left lower quadrant.       Comments: Mild tenderness to palpation to left lower quadrant of abdomen.  Musculoskeletal:       Back:     Comments: Patient has tenderness to palpation with mild swelling present to left lower lumbar region.  There is no direct spinal tenderness, crepitus, step-off noted.  No swelling or discoloration noted.  Neurological:     General: No focal deficit present.     Mental Status: She is alert and oriented to person, place, and time. Mental status is at baseline.     Deep Tendon Reflexes: Reflexes are normal and symmetric.  Psychiatric:        Mood and Affect: Mood normal.        Behavior: Behavior normal.        Thought Content: Thought content normal.        Judgment: Judgment normal.      UC Treatments / Results  Labs (all labs ordered are listed, but only  abnormal results are displayed) Labs Reviewed  CBC  COMPREHENSIVE METABOLIC PANEL  POCT URINALYSIS DIP (MANUAL ENTRY)  POCT URINE PREGNANCY    EKG   Radiology DG Abdomen 1 View  Result Date: 05/15/2023 CLINICAL DATA:  Flank pain and constipation. EXAM: ABDOMEN - 1 VIEW COMPARISON:  CT 05/13/2019 FINDINGS: Punctate stone projects over the upper right renal shadow, similar to prior CT. No evidence of ureteral or bladder stones. Calcification in the right pelvis corresponds to a phlebolith on prior CT. Bilateral tubal occlusion device in place. No bowel dilatation or evidence of obstruction. Small colonic stool burden. No abnormal rectal distention. The included lung bases are clear. No acute osseous findings. IMPRESSION: 1. Punctate stone projecting over the upper right renal shadow, similar to prior CT. 2. Normal bowel gas pattern with small volume of formed stool in the colon. Electronically Signed   By: Narda Rutherford M.D.   On: 05/15/2023 15:52    Procedures Procedures (including critical care time)  Medications Ordered in UC Medications - No data to display  Initial Impression / Assessment and Plan / UC Course  I have reviewed the triage vital signs and the nursing notes.  Pertinent labs & imaging results that were available during my care of the patient were reviewed by me and considered in  my medical decision making (see chart for details).     Advised patient to go to the emergency department as I do think that CT imaging would be most reasonable for patient given how diffuse pain is.  Patient declined going to the emergency department.  Risks associated with not going to the ER were discussed with patient and patient voiced understanding.  Given patient is declining ER evaluation, we will do limited evaluation here in urgent care which patient was agreeable to.  UA was unremarkable.  Urine pregnancy test was negative.  Abdominal x-ray does show a renal stone which seems to be  unchanged from previous CT scan.  Given this, I have a low suspicion the patient's flank pain is due to kidney stone, although did discuss with her this is a possibility.  Abdominal x-ray also showed very moderate stool burden as well so constipation that could be contributing to patient's abdominal discomfort and hard stools. No signs of obstruction. I am suspicious that patient's back pain could be musculoskeletal in etiology given some swelling and tenderness to palpation with pain being reproducible with palpation.  Therefore, I do think that patient's symptoms could be multifactorial.  Will treat constipation with MiraLAX daily and encouraged patient to ensure adequate fluid hydration and high-fiber diet.  Patient was prescribed muscle relaxer to take as needed for possible musculoskeletal pain of the lower back.  She does not take any medications for sleep at this time so this should be safe.  She does take gabapentin so encouraged her to attempt to separate muscle relaxer and gabapentin as when used in conjunction, they can cause increased drowsiness.  Also advised her to not drive or drink alcohol with taking his medications.  She voiced understanding of these.  Obtained CMP and CBC as well to rule out any worrisome etiology.  Patient was given strict ER precautions.  Patient verbalized understanding and was agreeable with plan. Final Clinical Impressions(s) / UC Diagnoses   Final diagnoses:  Acute left-sided low back pain without sciatica  Urine pregnancy test negative  Abdominal pain, left lower quadrant  Constipation, unspecified constipation type     Discharge Instructions      I have prescribed you a muscle relaxer to take for your back pain.  Please be advised that it can make you drowsy so do not drive or drink alcohol with it.  As we discussed, also please be careful with taking in conjunction with gabapentin as this can cause increased drowsiness.  Alternate ice and heat to affected  area of back.  You do have a little bit of stool on your x-ray so I will prescribe MiraLAX to take daily to help soften your stools and help with constipation.  Continue to drink plenty of water.  Blood work is pending.  Will call if it is abnormal.     ED Prescriptions     Medication Sig Dispense Auth. Provider   methocarbamol (ROBAXIN) 500 MG tablet Take 1 tablet (500 mg total) by mouth 2 (two) times daily as needed for muscle spasms. 20 tablet [place], [place], [place]   polyethylene glycol (MIRALAX) 17 g packet Take 17 g by mouth daily. 14 each [place], [place]      PDMP not reviewed this encounter.   G[place], [place] 05/15/23 [id number]

## 2023-05-15 NOTE — Discharge Instructions (Addendum)
I have prescribed you a muscle relaxer to take for your back pain.  Please be advised that it can make you drowsy so do not drive or drink alcohol with it.  As we discussed, also please be careful with taking in conjunction with gabapentin as this can cause increased drowsiness.  Alternate ice and heat to affected area of back.  You do have a little bit of stool on your x-ray so I will prescribe MiraLAX to take daily to help soften your stools and help with constipation.  Continue to drink plenty of water.  Blood work is pending.  Will call if it is abnormal.

## 2023-05-15 NOTE — ED Triage Notes (Signed)
Pt sts left sided back pain starting today; pt sts she feels a "knot" in that area

## 2023-06-10 DIAGNOSIS — Z419 Encounter for procedure for purposes other than remedying health state, unspecified: Secondary | ICD-10-CM | POA: Diagnosis not present

## 2023-06-20 ENCOUNTER — Encounter (HOSPITAL_COMMUNITY): Payer: Self-pay | Admitting: Psychiatry

## 2023-06-20 ENCOUNTER — Telehealth (INDEPENDENT_AMBULATORY_CARE_PROVIDER_SITE_OTHER): Payer: Medicaid Other | Admitting: Psychiatry

## 2023-06-20 DIAGNOSIS — F32A Depression, unspecified: Secondary | ICD-10-CM | POA: Diagnosis not present

## 2023-06-20 DIAGNOSIS — F411 Generalized anxiety disorder: Secondary | ICD-10-CM | POA: Diagnosis not present

## 2023-06-20 MED ORDER — AMITRIPTYLINE HCL 25 MG PO TABS: 25.0000 mg | ORAL_TABLET | Freq: Every day | ORAL | 3 refills | Status: DC

## 2023-06-20 MED ORDER — HYDROXYZINE HCL 10 MG PO TABS: 10.0000 mg | ORAL_TABLET | Freq: Three times a day (TID) | ORAL | 3 refills | Status: DC | PRN

## 2023-06-20 MED ORDER — GABAPENTIN 300 MG PO CAPS
300.0000 mg | ORAL_CAPSULE | Freq: Three times a day (TID) | ORAL | 3 refills | Status: DC
Start: 2023-06-20 — End: 2023-07-18

## 2023-06-20 NOTE — Progress Notes (Signed)
BH MD/PA/NP OP Progress Note Virtual Visit via Video Note  I connected with Nancy Dickerson on 08/17/2021 at 11:30 AM EDT by a video enabled telemedicine application and verified that I am speaking with the correct person using two identifiers.  Location: Patient: Home Provider: Clinic   I discussed the limitations of evaluation and management by telemedicine and the availability of in person appointments. The patient expressed understanding and agreed to proceed.  I provided 30 minutes of non-face-to-face time during this encounter.    06/20/2023 12:16 PM Nancy Dickerson  MRN:  [id number]  Chief Complaint:  "I have been bleeding"  HPI: 42 year old female seen today for follow up psychiatric evaluation.  She has psychiatric history of anxiety, depression, and insomnia. She is currently managed on gabapentin 300 mg 3 times daily, Belsomra 10 mg nightly, and Wellbutrin 150 mg daily. She reports that her Belsomra was never approved and Web designer that she discontinued Wellbutrin.  She notes that she takes gabapentin twice daily as at times it over sedates her.    Today, patient was well groomed, pleasant, cooperative, engaged in conversation and maintained eye contact. She informed provider that she has been bleeding non stop since Sunday. She also notes that she is pain. Provider asked patient if she was on her mensal cycle. She notes that she she had an ablastin of her uterus in 2020 and was told that she was not supposed to have menstrual cycles or children. She notes that she took a pregnancy test and it was negative.  Patient inform her that she got a 20 pack of pads on Monday and notes that she saturates them all.  She informed Clinical research associate that she is now down to 2 pads.  Provider recommended patient go to the urgent care and set up an appointment with her OBGYN.  Provider reached out to patient's OB/GYN Dr. Mariel Aloe and he instructed the patient to call the clinic to set up an  appointment.  Mentally patient notes that she has been anxious and has poor sleep. She notes that she sleeps 6 or less hours.   Today provider conducted a GAD-7 and patient scored an 15, at her last visit she scored a 8.  Provider also conducted PHQ-9 patient scored a 12, at her last visit she scored a 5.  Patient notes that her sleep continues to be problematic.  She informed writer that at times she sleeps less than 6 hours nightly.  Patient informed writer that her appetite has been poor.  Today she denies SI/HI/VAH, mania, or paranoia.  Patient informed Clinical research associate that she smokes marijuana periodically.  Provider informed patient that marijuana can exacerbate her mental health.  Provider reviewed GeneSight testing results with patient.  Patient informed Clinical research associate that she does not wish to gain weight.  Provider informed patient that many medications that help manage sleep may increase her appetite.  She endorsed understanding and was agreeable to starting amitriptyline 25 mg nightly to help manage sleep, anxiety, and depression.  Patient also agreeable to starting hydroxyzine 10 mg 3 times daily to help manage anxiety and sleep.  At this time Belsomra and Wellbutrin not restarted.  She will follow-up with her OB/GYN regarding her increased bleeding.  Potential side effects of medication and risks vs benefits of treatment vs non-treatment were explained and discussed. All questions were answered.  No other concerns at this time.       Visit Diagnosis:    ICD-10-CM   1. Mild depression  F32.A amitriptyline (ELAVIL) 25 MG tablet    2. Generalized anxiety disorder  F41.1 amitriptyline (ELAVIL) 25 MG tablet    hydrOXYzine (ATARAX) 10 MG tablet    gabapentin (NEURONTIN) 300 MG capsule       Past Psychiatric History: Anxiety, Depression, and insomnia    Past Medical History:  Past Medical History:  Diagnosis Date   Anemia    Asthma    Bursitis of hip    Cardiomegaly    Chondromalacia of hip     Chondromalacia of right knee    COPD (chronic obstructive pulmonary disease) (HCC)    GERD (gastroesophageal reflux disease)    H/O transfusion of packed red blood cells    Tinnitus     Past Surgical History:  Procedure Laterality Date   CERVICAL CONIZATION W/BX N/A 06/25/2019   Procedure: CONIZATION CERVIX WITH BIOPSY - COLD KNIFE;  Surgeon: Allie Bossier, MD;  Location: [facility];  Service: Gynecology;  Laterality: N/A;   ENDOMETRIAL ABLATION N/A 06/25/2019   Procedure: MINERVA ABLATION;  Surgeon: Allie Bossier, MD;  Location: [facility];  Service: Gynecology;  Laterality: N/A;  Minerva rep will here confirmed on 06/20/19 CS   HYSTEROSCOPY WITH D & C  06/25/2019   Procedure: DILATATION AND CURETTAGE /HYSTEROSCOPY;  Surgeon: Allie Bossier, MD;  Location: [facility];  Service: Gynecology;;   NO PAST SURGERIES      Family Psychiatric History: Daughter SA, Mother Bipolar disorder, and sister Bipolar disorder  Family History:  Family History  Problem Relation Age of Onset   Sudden death Father 5   Asthma Sister    Asthma Son     Social History:  Social History   Socioeconomic History   Marital status: Single    Spouse name: Not on file   Number of children: 6   Years of education: 12   Highest education level: Not on file  Occupational History   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  Tobacco Use   Smoking status: Never   Smokeless tobacco: Never  Vaping Use   Vaping status: Never Used  Substance and Sexual Activity   Alcohol use: Yes    Comment: rare   Drug use: Yes    Types: Marijuana    Comment: Occasionally   Sexual activity: Yes  Other Topics Concern   Not on file  Social History Narrative   ** Merged History Encounter **         Lives with someone   Right handed    Little caffeine - drinks Sprite   Education - some college          Social Determinants of Health   Financial Resource Strain: Not on file  Food Insecurity: No Food  Insecurity (10/06/2021)   Hunger Vital Sign    Worried About Running Out of Food in the Last Year: Never true    Ran Out of Food in the Last Year: Never true  Transportation Needs: No Transportation Needs (10/06/2021)   PRAPARE - Administrator, Civil Service (Medical): No    Lack of Transportation (Non-Medical): No  Physical Activity: Not on file  Stress: Not on file  Social Connections: Unknown (02/06/2022)   Received from [facility]   Social Network    Social Network: Not on file    Allergies: No Known Allergies  Metabolic Disorder Labs: Lab Results  Component Value Date   HGBA1C 5.6 01/08/2023   MPG 114 01/08/2023  MPG 99.67 01/30/2018   No results found for: "PROLACTIN" Lab Results  Component Value Date   CHOL 157 09/08/2021   TRIG 83 09/08/2021   HDL 50 09/08/2021   CHOLHDL 3.1 09/08/2021   VLDL 18 01/30/2018   LDLCALC 91 09/08/2021   LDLCALC 58 01/30/2018   Lab Results  Component Value Date   TSH 1.353 01/08/2023   TSH 0.665 09/08/2021    Therapeutic Level Labs: No results found for: "LITHIUM" No results found for: "VALPROATE" No results found for: "CBMZ"  Current Medications: Current Outpatient Medications  Medication Sig Dispense Refill   amitriptyline (ELAVIL) 25 MG tablet Take 1 tablet (25 mg total) by mouth at bedtime. 30 tablet 3   hydrOXYzine (ATARAX) 10 MG tablet Take 1 tablet (10 mg total) by mouth 3 (three) times daily as needed. 90 tablet 3   albuterol (PROVENTIL) (2.5 MG/3ML) 0.083% nebulizer solution 2.5 mg every 6 (six) hours as needed. (Patient taking differently: Take 2.5 mg by nebulization every 6 (six) hours as needed for wheezing or shortness of breath. 2.5 mg every 6 (six) hours as needed.) 75 mL 1   albuterol (VENTOLIN HFA) 108 (90 Base) MCG/ACT inhaler Inhale 2 puffs into the lungs every 6 (six) hours as needed for wheezing or shortness of breath. 8 g 2   aspirin EC 81 MG tablet Take 1 tablet (81 mg total) by mouth daily.  Swallow whole. 30 tablet 12   atorvastatin (LIPITOR) 20 MG tablet Take 1 tablet (20 mg total) by mouth daily. 30 tablet 0   gabapentin (NEURONTIN) 300 MG capsule Take 1 capsule (300 mg total) by mouth 3 (three) times daily. 90 capsule 3   methocarbamol (ROBAXIN) 500 MG tablet Take 1 tablet (500 mg total) by mouth 2 (two) times daily as needed for muscle spasms. 20 tablet 0   polyethylene glycol (MIRALAX) 17 g packet Take 17 g by mouth daily. 14 each 0   Suvorexant (BELSOMRA) 10 MG TABS Take 1 tablet (10 mg total) by mouth at bedtime as needed. 15 tablet 2   No current facility-administered medications for this visit.     Musculoskeletal: Strength & Muscle Tone: within normal limits and  telehealth visit Gait & Station: normal, telehealth visit Patient leans: N/A  Psychiatric Specialty Exam: Review of Systems  There were no vitals taken for this visit.There is no height or weight on file to calculate BMI.  General Appearance: Well Groomed  Eye Contact:  Good  Speech:  Clear and Coherent and Normal Rate  Volume:  Normal  Mood:  Anxious and Depressed  Affect:  Appropriate and Congruent  Thought Process:  Coherent, Goal Directed and Linear  Orientation:  Full (Time, Place, and Person)  Thought Content: WDL and Logical   Suicidal Thoughts:  No  Homicidal Thoughts:  No  Memory:  Immediate;   Good Recent;   Good Remote;   Good  Judgement:  Good  Insight:  Good  Psychomotor Activity:  Normal  Concentration:  Concentration: Good and Attention Span: Good  Recall:  Good  Fund of Knowledge: Good  Language: Good  Akathisia:  No  Handed:  Right  AIMS (if indicated): Not done  Assets:  Communication Skills Desire for Improvement Financial Resources/Insurance Housing Intimacy Physical Health Social Support  ADL's:  Intact  Cognition: WNL  Sleep:  Fair   Screenings: GAD-7    Flowsheet Row Video Visit from 06/20/2023 in [facility] Video Visit from  03/28/2023 in [facility] Office Visit from 01/16/2023 in [facility] Video Visit from 10/20/2021 in [facility] Procedure visit from 10/06/2021 in [facility]  Total GAD-7 Score 15 8 7 11  0      PHQ2-9    Flowsheet Row Video Visit from 06/20/2023 in [facility] Most recent reading at 06/20/2023 11:44 AM Video Visit from 03/28/2023 in [facility] Most recent reading at 03/28/2023 10:38 AM Office Visit from 01/16/2023 in [facility] Most recent reading at 01/16/2023  1:20 PM Office Visit from 10/20/2021 in [facility] Most recent reading at 10/20/2021  3:11 PM Video Visit from 10/20/2021 in [facility] Most recent reading at 10/20/2021  2:33 PM  PHQ-2 Total Score 3 2 2  0 4  PHQ-9 Total Score 12 5 13  -- 12      Flowsheet Row ED from 05/15/2023 in [facility] Video Visit from 03/28/2023 in [facility] ED from 03/11/2023 in [facility])  C-SSRS RISK CATEGORY No Risk No Risk No Risk        Assessment and Plan: Patient notes that she has been bleeding excessively since Sunday.  She also informed writer that she has been more anxious, depressed, and has poor sleep.Provider reviewed GeneSight testing results with patient.  Patient informed Clinical research associate that she does not wish to gain weight.  Provider informed patient that many medications that help manage sleep may increase her appetite.  She endorsed understanding and was agreeable to starting amitriptyline 25 mg nightly to help manage sleep, anxiety, and depression.  Patient also agreeable to starting hydroxyzine 10 mg 3 times daily to help manage anxiety and sleep.  At this time Belsomra  and Wellbutrin not restarted.  She will follow-up with her OB/GYN regarding her increased bleeding.  1. Generalized anxiety disorder  Start- amitriptyline (ELAVIL) 25 MG tablet; Take 1 tablet (25 mg total) by mouth at bedtime.  Dispense: 30 tablet; Refill: 3 Start- hydrOXYzine (ATARAX) 10 MG tablet; Take 1 tablet (10 mg total) by mouth 3 (three) times daily as needed.  Dispense: 90 tablet; Refill: 3 Continue- gabapentin (NEURONTIN) 300 MG capsule; Take 1 capsule (300 mg total) by mouth 3 (three) times daily.  Dispense: 90 capsule; Refill: 3  2. Mild depression  Start- amitriptyline (ELAVIL) 25 MG tablet; Take 1 tablet (25 mg total) by mouth at bedtime.  Dispense: 30 tablet; Refill: 3   Follow up in 1 months  Shanna Cisco, NP 06/20/2023, 12:16 PM

## 2023-07-10 DIAGNOSIS — Z419 Encounter for procedure for purposes other than remedying health state, unspecified: Secondary | ICD-10-CM | POA: Diagnosis not present

## 2023-07-16 ENCOUNTER — Telehealth (HOSPITAL_COMMUNITY): Payer: Medicaid Other | Admitting: Psychiatry

## 2023-07-18 ENCOUNTER — Encounter (HOSPITAL_COMMUNITY): Payer: Self-pay | Admitting: Psychiatry

## 2023-07-18 ENCOUNTER — Telehealth (INDEPENDENT_AMBULATORY_CARE_PROVIDER_SITE_OTHER): Payer: Medicaid Other | Admitting: Psychiatry

## 2023-07-18 DIAGNOSIS — R635 Abnormal weight gain: Secondary | ICD-10-CM | POA: Diagnosis not present

## 2023-07-18 DIAGNOSIS — F331 Major depressive disorder, recurrent, moderate: Secondary | ICD-10-CM | POA: Diagnosis not present

## 2023-07-18 DIAGNOSIS — G4701 Insomnia due to medical condition: Secondary | ICD-10-CM

## 2023-07-18 DIAGNOSIS — F411 Generalized anxiety disorder: Secondary | ICD-10-CM | POA: Diagnosis not present

## 2023-07-18 MED ORDER — GABAPENTIN 300 MG PO CAPS
300.0000 mg | ORAL_CAPSULE | Freq: Three times a day (TID) | ORAL | 3 refills | Status: DC
Start: 2023-07-18 — End: 2023-10-09

## 2023-07-18 MED ORDER — BUSPIRONE HCL 10 MG PO TABS: 10.0000 mg | ORAL_TABLET | Freq: Three times a day (TID) | ORAL | 3 refills | Status: DC

## 2023-07-18 MED ORDER — BELSOMRA 10 MG PO TABS
10.0000 mg | ORAL_TABLET | Freq: Every evening | ORAL | 2 refills | Status: DC | PRN
Start: 2023-07-18 — End: 2023-10-09

## 2023-07-18 MED ORDER — BUPROPION HCL ER (XL) 300 MG PO TB24: 300.0000 mg | ORAL_TABLET | ORAL | 3 refills | Status: DC

## 2023-07-18 NOTE — Progress Notes (Signed)
BH MD/PA/NP OP Progress Note Virtual Visit via Video Note  I connected with ARMIYAH CAPRON on 08/17/2021 at  3:30 PM EDT by a video enabled telemedicine application and verified that I am speaking with the correct person using two identifiers.  Location: Patient: Home Provider: Clinic   I discussed the limitations of evaluation and management by telemedicine and the availability of in person appointments. The patient expressed understanding and agreed to proceed.  I provided 30 minutes of non-face-to-face time during this encounter.    07/18/2023 11:29 AM Alaisa DANNIELLA ROBBEN  MRN:  [id number]  Chief Complaint:  "The medication did not work"  HPI: 42 year old female seen today for follow up psychiatric evaluation.  She has psychiatric history of anxiety, depression, and insomnia. She is currently managed on gabapentin 300 mg 3 times daily,amitriptyline 25 mg nightly and hydroxyzine 10 mg three times daily. She notes that she takes gabapentin twice daily as at times it over sedates her.    Today, patient was well groomed, pleasant, cooperative, engaged in conversation and maintained eye contact. She informed provider that her medications not working. She notes that amitriptyline makes he sleepy in the day. She also notes that hydroxyzine makes her sleepy and does not help with anxiety. Patient notes that she has to turn the air on to try and stay awake in the day. Since stopping amitriptyline and hydroxyzine patient notes that her sleep continues to be poor.  Patient was once prescribed Belsomra but it was not covered by her insurance.  She notes that she would like to retry that.  She also notes that she would like to retry Wellbutrin to help manage her weight and depression.  She also note that work at [employer] has been stressful.  Patient informed Clinical research associate that she is having difficulty because she was unable to concentrate.  Provider asked patient if she continued to smoke marijuana.  She   informed Clinical research associate that she  reduced her marijuana consumption.  Today provider conducted a GAD-7 and patient scored a 9, at her last visit she scored a 15.  Provider also conducted a PHQ-9 to be scored 10, at the last visit she scored a 12.  Today she denies SI/HI/VAH, mania, paranoia.  Patient informed Clinical research associate that she continues to have heavy menstrual cycles but have not went to her OB/GYN to be assessed.  Patient reports that gabapentin is effective for managing her leg pain however over sedates her at times.  Provider recommended reducing gabapentin however she notes that she finds it effective for her leg.  Today patient agreeable to starting BuSpar 10 mg 3 times daily to help manage anxiety depression.  She will restart Wellbutrin 300 mg to help manage concentration, weight, and depression.  Patient referred to home health diabetes and weight management.  Patient was restarted on Belsomra 10 mg nightly as needed.  Potential side effects of medication and risks vs benefits of treatment vs non-treatment were explained and discussed. All questions were answered.She will continue her other medications as prescribed.     Visit Diagnosis:    ICD-10-CM   1. Moderate episode of recurrent major depressive disorder (HCC)  F33.1 buPROPion (WELLBUTRIN XL) 300 MG 24 hr tablet    2. 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 Weight gain  R63.5 buPROPion [facility]  XL) 300 MG 24 hr tablet    Ambulatory referral to Nutrition and Diabetic Education        Past Psychiatric History: Anxiety, Depression, and insomnia    Past Medical History:  Past Medical History:  Diagnosis Date   Anemia    Asthma    Bursitis of hip    Cardiomegaly    Chondromalacia of hip    Chondromalacia of right knee    COPD (chronic obstructive pulmonary disease) (HCC)     GERD (gastroesophageal reflux disease)    H/O transfusion of packed red blood cells    Tinnitus     Past Surgical History:  Procedure Laterality Date   CERVICAL CONIZATION W/BX N/A 06/25/2019   Procedure: CONIZATION CERVIX WITH BIOPSY - COLD KNIFE;  Surgeon: Allie Bossier, MD;  Location: [facility];  Service: Gynecology;  Laterality: N/A;   ENDOMETRIAL ABLATION N/A 06/25/2019   Procedure: MINERVA ABLATION;  Surgeon: Allie Bossier, MD;  Location: [facility];  Service: Gynecology;  Laterality: N/A;  Minerva rep will here confirmed on 06/20/19 CS   HYSTEROSCOPY WITH D & C  06/25/2019   Procedure: DILATATION AND CURETTAGE /HYSTEROSCOPY;  Surgeon: Allie Bossier, MD;  Location: [facility];  Service: Gynecology;;   NO PAST SURGERIES      Family Psychiatric History: Daughter SA, Mother Bipolar disorder, and sister Bipolar disorder  Family History:  Family History  Problem Relation Age of Onset   Sudden death Father 73   Asthma Sister    Asthma Son     Social History:  Social History   Socioeconomic History   Marital status: Single    Spouse name: Not on file   Number of children: 6   Years of education: 12   Highest education level: Not on file  Occupational History   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  Tobacco Use   Smoking status: Never   Smokeless tobacco: Never  Vaping Use   Vaping status: Never Used  Substance and Sexual Activity   Alcohol use: Yes    Comment: rare   Drug use: Yes    Types: Marijuana    Comment: Occasionally   Sexual activity: Yes  Other Topics Concern   Not on file  Social History Narrative   ** Merged History Encounter **         Lives with someone   Right handed    Little caffeine - drinks Sprite   Education - some college          Social Determinants of Health   Financial Resource Strain: Not on file  Food Insecurity: No Food Insecurity (10/06/2021)   Hunger Vital Sign    Worried About Running Out of Food in  the Last Year: Never true    Ran Out of Food in the Last Year: Never true  Transportation Needs: No Transportation Needs (10/06/2021)   PRAPARE - Administrator, Civil Service (Medical): No    Lack of Transportation (Non-Medical): No  Physical Activity: Not on file  Stress: Not on file  Social Connections: Unknown (02/06/2022)   [facility]   Social Network    Social Network: Not on file    Allergies: No Known Allergies  Metabolic Disorder Labs: Lab Results  Component Value Date   HGBA1C 5.6 01/08/2023   MPG 114 01/08/2023   MPG 99.67 01/30/2018   No results found for: "PROLACTIN" Lab Results  Component Value Date  CHOL 157 09/08/2021   TRIG 83 09/08/2021   HDL 50 09/08/2021   CHOLHDL 3.1 09/08/2021   VLDL 18 01/30/2018   LDLCALC 91 09/08/2021   LDLCALC 58 01/30/2018   Lab Results  Component Value Date   TSH 1.353 01/08/2023   TSH 0.665 09/08/2021    Therapeutic Level Labs: No results found for: "LITHIUM" No results found for: "VALPROATE" No results found for: "CBMZ"  Current Medications: Current Outpatient Medications  Medication Sig Dispense Refill   buPROPion (WELLBUTRIN XL) 300 MG 24 hr tablet Take 1 tablet (300 mg total) by mouth every morning. 30 tablet 3   busPIRone (BUSPAR) 10 MG tablet Take 1 tablet (10 mg total) by mouth 3 (three) times daily. 90 tablet 3   albuterol (PROVENTIL) (2.5 MG/3ML) 0.083% nebulizer solution 2.5 mg every 6 (six) hours as needed. (Patient taking differently: Take 2.5 mg by nebulization every 6 (six) hours as needed for wheezing or shortness of breath. 2.5 mg every 6 (six) hours as needed.) 75 mL 1   albuterol (VENTOLIN HFA) 108 (90 Base) MCG/ACT inhaler Inhale 2 puffs into the lungs every 6 (six) hours as needed for wheezing or shortness of breath. 8 g 2   aspirin EC 81 MG tablet Take 1 tablet (81 mg total) by mouth daily. Swallow whole. 30 tablet 12   atorvastatin (LIPITOR) 20 MG tablet  Take 1 tablet (20 mg total) by mouth daily. 30 tablet 0   gabapentin (NEURONTIN) 300 MG capsule Take 1 capsule (300 mg total) by mouth 3 (three) times daily. 90 capsule 3   methocarbamol (ROBAXIN) 500 MG tablet Take 1 tablet (500 mg total) by mouth 2 (two) times daily as needed for muscle spasms. 20 tablet 0   polyethylene glycol (MIRALAX) 17 g packet Take 17 g by mouth daily. 14 each 0   Suvorexant (BELSOMRA) 10 MG TABS Take 1 tablet (10 mg total) by mouth at bedtime as needed. 15 tablet 2   No current facility-administered medications for this visit.     Musculoskeletal: Strength & Muscle Tone: within normal limits and  telehealth visit Gait & Station: normal, telehealth visit Patient leans: N/A  Psychiatric Specialty Exam: Review of Systems  There were no vitals taken for this visit.There is no height or weight on file to calculate BMI.  General Appearance: Well Groomed  Eye Contact:  Good  Speech:  Clear and Coherent and Normal Rate  Volume:  Normal  Mood:  Anxious and Depressed, mild improvement  Affect:  Appropriate and Congruent  Thought Process:  Coherent, Goal Directed and Linear  Orientation:  Full (Time, Place, and Person)  Thought Content: WDL and Logical   Suicidal Thoughts:  No  Homicidal Thoughts:  No  Memory:  Immediate;   Good Recent;   Good Remote;   Good  Judgement:  Good  Insight:  Good  Psychomotor Activity:  Normal  Concentration:  Concentration: Good and Attention Span: Good  Recall:  Good  Fund of Knowledge: Good  Language: Good  Akathisia:  No  Handed:  Right  AIMS (if indicated): Not done  Assets:  Communication Skills Desire for Improvement Financial Resources/Insurance Housing Intimacy Physical Health Social Support  ADL's:  Intact  Cognition: WNL  Sleep:  Fair   Screenings: GAD-7    Flowsheet Row Video Visit from 07/18/2023 in [facility] Video Visit from 06/20/2023 in [facility] Video Visit from 03/28/2023 in [facility] Office Visit from  01/16/2023 in [facility] Video Visit from 10[facility]20/2021 in [facility]  Total GAD-7 Score 9 15 8 7 11       PHQ2-9    Flowsheet Row Video Visit from 07/18/2023 in [facility] Video Visit from 06/20/2023 in [facility] Video Visit from 03/28/2023 in [facility] Office Visit from 01/16/2023 in [facility] Office Visit from 10/20/2021 in [facility]  PHQ-2 Total Score 1 3 2 2  0  PHQ-9 Total Score 10 12 5 13  --      Flowsheet Row ED from 05/15/2023 in [facility] Video Visit from 03/28/2023 in [facility] ED from 03/11/2023 in [facility])  C-SSRS RISK CATEGORY No Risk No Risk No Risk        Assessment and Plan: Patient notes that her anxiety and depression has somewhat improved but her sleep is not.  Since being without hydroxyzine and amitriptyline she notes that her sleep is again poor.  With these medications she is oversedated.Patient reports that gabapentin is effective for managing her leg pain however over sedates her at times.  Provider recommended reducing gabapentin however she notes that she finds it effective for her leg.  Today patient agreeable to starting BuSpar 10 mg 3 times daily to help manage anxiety depression.  She will restart Wellbutrin 300 mg to help manage concentration, weight, and depression.  Patient referred to home health diabetes and weight management.  Patient was restarted on Belsomra 10 mg nightly as needed.  She will continue her other medications as prescribed.  1. Generalized anxiety disorder  Start- busPIRone (BUSPAR) 10 MG tablet; Take 1 tablet (10 mg total) by mouth 3 (three)  times daily.  Dispense: 90 tablet; Refill: 3 Continue gabapentin (NEURONTIN) 300 MG capsule; Take 1 capsule (300 mg total) by mouth 3 (three) times daily.  Dispense: 90 capsule; Refill: 3  2. Insomnia due to medical condition  Restart- Suvorexant (BELSOMRA) 10 MG TABS; Take 1 tablet (10 mg total) by mouth at bedtime as needed.  Dispense: 15 tablet; Refill: 2  3. Moderate episode of recurrent major depressive disorder (HCC)  Restart- buPROPion (WELLBUTRIN XL) 300 MG 24 hr tablet; Take 1 tablet (300 mg total) by mouth every morning.  Dispense: 30 tablet; Refill: 3  4. Weight gain  - buPROPion (WELLBUTRIN XL) 300 MG 24 hr tablet; Take 1 tablet (300 mg total) by mouth every morning.  Dispense: 30 tablet; Refill: 3 - Ambulatory referral to Nutrition and Diabetic Education  Follow up in 2.5 months  Shanna Cisco, NP 07/18/2023, 11:29 AM

## 2023-08-03 ENCOUNTER — Telehealth (HOSPITAL_COMMUNITY): Payer: Self-pay

## 2023-08-03 NOTE — Telephone Encounter (Signed)
Patient is calling stating that her "ADHD is all over the place" and the Wellbutrin is making her eat everything. She would like a call back please.

## 2023-08-07 ENCOUNTER — Telehealth: Payer: Medicaid Other | Admitting: Nurse Practitioner

## 2023-08-07 ENCOUNTER — Other Ambulatory Visit (HOSPITAL_COMMUNITY): Payer: Self-pay | Admitting: Psychiatry

## 2023-08-07 DIAGNOSIS — J449 Chronic obstructive pulmonary disease, unspecified: Secondary | ICD-10-CM

## 2023-08-07 DIAGNOSIS — F902 Attention-deficit hyperactivity disorder, combined type: Secondary | ICD-10-CM

## 2023-08-07 MED ORDER — PREDNISONE 20 MG PO TABS
20.0000 mg | ORAL_TABLET | Freq: Two times a day (BID) | ORAL | 0 refills | Status: AC
Start: 2023-08-07 — End: 2023-08-12

## 2023-08-07 MED ORDER — ALBUTEROL SULFATE HFA 108 (90 BASE) MCG/ACT IN AERS
2.0000 | INHALATION_SPRAY | Freq: Four times a day (QID) | RESPIRATORY_TRACT | 0 refills | Status: AC | PRN
Start: 2023-08-07 — End: ?

## 2023-08-07 MED ORDER — GUANFACINE HCL ER 2 MG PO TB24
2.0000 mg | ORAL_TABLET | Freq: Every day | ORAL | 3 refills | Status: DC
Start: 2023-08-07 — End: 2023-12-12

## 2023-08-07 NOTE — Progress Notes (Signed)
Virtual Visit Consent   Nancy Dickerson, you are scheduled for a virtual visit with a  provider today. Just as with appointments in the office, your consent must be obtained to participate. Your consent will be active for this visit and any virtual visit you may have with one of our providers in the next 365 days. If you have a MyChart account, a copy of this consent can be sent to you electronically.  As this is a virtual visit, video technology does not allow for your provider to perform a traditional examination. This may limit your provider's ability to fully assess your condition. If your provider identifies any concerns that need to be evaluated in person or the need to arrange testing (such as labs, EKG, etc.), we will make arrangements to do so. Although advances in technology are sophisticated, we cannot ensure that it will always work on either your end or our end. If the connection with a video visit is poor, the visit may have to be switched to a telephone visit. With either a video or telephone visit, we are not always able to ensure that we have a secure connection.  By engaging in this virtual visit, you consent to the provision of healthcare and authorize for your insurance to be billed (if applicable) for the services provided during this visit. Depending on your insurance coverage, you may receive a charge related to this service.  I need to obtain your verbal consent now. Are you willing to proceed with your visit today? Nancy Dickerson has provided verbal consent on 08/07/2023 for a virtual visit (video or telephone). Viviano Simas, FNP  Date: 08/07/2023 5:47 PM  Virtual Visit via Video Note   I, Viviano Simas, connected with  Nancy Dickerson  ([id number], 10-27-1980) on 08/07/23 at  5:45 PM EDT by a video-enabled telemedicine application and verified that I am speaking with the correct person using two identifiers.  Location: Patient: Virtual Visit Location Patient:  Home Provider: Virtual Visit Location Provider: Home Office   I discussed the limitations of evaluation and management by telemedicine and the availability of in person appointments. The patient expressed understanding and agreed to proceed.    History of Present Illness: Nancy Dickerson is a 42 y.o. who identifies as a female who was assigned female at birth, and is being seen today for COPD exacerbation.  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   Chest congestion 07/15/2021   Acute cough 07/15/2021   Tachycardia 06/01/2021   SOB (shortness of breath) 06/01/2021   Attention deficit hyperactivity disorder (ADHD), combined type 01/13/2021   Chronic obstructive pulmonary disease (HCC) 11/26/2020   Moderate episode of recurrent major depressive disorder (HCC) 10/14/2020   Generalized anxiety disorder 10/14/2020   Dysphonia 08/25/2020   GERD (gastroesophageal reflux disease) 08/25/2020   Pulsatile tinnitus of right ear  08/25/2020   Dysfunctional uterine bleeding 06/03/2020   Patellofemoral pain syndrome of left knee 11/08/2019   Acute pain of left knee 08/13/2019   Insomnia 07/07/2019   Lumbar radicular pain 07/07/2019   Uterine leiomyoma 03/12/2018   Trochanteric bursitis of right hip 03/12/2018   Class 3 severe obesity due to excess calories without serious comorbidity with body mass index (BMI) of 40.0 to 44.9 in  adult [facility]) 03/12/2018   Iron deficiency anemia due to chronic blood loss 01/29/2018   Right sided weakness 01/29/2018    Allergies: No Known Allergies Medications:  Current Outpatient Medications:    albuterol (PROVENTIL) (2.5 MG/3ML) 0.083% nebulizer solution, 2.5 mg every 6 (six) hours as needed. (Patient taking differently: Take 2.5 mg by nebulization every 6 (six) hours as needed for wheezing or shortness of breath. 2.5 mg every 6 (six) hours as needed.), Disp: 75 mL, Rfl: 1   albuterol (VENTOLIN HFA) 108 (90 Base) MCG/ACT inhaler, Inhale 2 puffs into the lungs every 6 (six) hours as needed for wheezing or shortness of breath., Disp: 8 g, Rfl: 2   aspirin EC 81 MG tablet, Take 1 tablet (81 mg total) by mouth daily. Swallow whole., Disp: 30 tablet, Rfl: 12   atorvastatin (LIPITOR) 20 MG tablet, Take 1 tablet (20 mg total) by mouth daily., Disp: 30 tablet, Rfl: 0   busPIRone (BUSPAR) 10 MG tablet, Take 1 tablet (10 mg total) by mouth 3 (three) times daily., Disp: 90 tablet, Rfl: 3   gabapentin (NEURONTIN) 300 MG capsule, Take 1 capsule (300 mg total) by mouth 3 (three) times daily., Disp: 90 capsule, Rfl: 3   guanFACINE (INTUNIV) 2 MG TB24 ER tablet, Take 1 tablet (2 mg total) by mouth daily., Disp: 30 tablet, Rfl: 3   methocarbamol (ROBAXIN) 500 MG tablet, Take 1 tablet (500 mg total) by mouth 2 (two) times daily as needed for muscle spasms., Disp: 20 tablet, Rfl: 0   polyethylene glycol (MIRALAX) 17 g packet, Take 17 g by mouth daily., Disp: 14 each, Rfl: 0   Suvorexant (BELSOMRA) 10 MG TABS, Take 1 tablet (10 mg total) by mouth at bedtime as needed., Disp: 15 tablet, Rfl: 2  Observations/Objective: Patient is well-developed, well-nourished in no acute distress.  Resting comfortably  at home.  Head is normocephalic, atraumatic.  No labored breathing.  Speech is clear and coherent with logical content.  Patient is alert and oriented at baseline.    Assessment and Plan:   1. Chronic  obstructive pulmonary disease, unspecified COPD type (HCC)  - albuterol (VENTOLIN HFA) 108 (90 Base) MCG/ACT inhaler; Inhale 2 puffs into the lungs every 6 (six) hours as needed for wheezing or shortness of breath.  Dispense: 8 g; Refill: 0 - predniSONE (DELTASONE) 20 MG tablet; Take 1 tablet (20 mg total) by mouth 2 (two) times daily with a meal for 5 days.  Dispense: 10 tablet; Refill: 0    Follow up with PCP to discuss ongoing management and new daily inhaler options    Follow Up Instructions: I discussed the assessment and treatment plan with the patient. The patient was provided an opportunity to ask questions and all were answered. The patient agreed with the plan and demonstrated an understanding of the instructions.  A copy of instructions were sent to the patient via MyChart unless otherwise noted below.    The patient was advised to call back or seek an in-person evaluation if the symptoms worsen or if the condition fails to improve as anticipated.  Viviano Simas, FNP

## 2023-08-07 NOTE — Telephone Encounter (Signed)
Patient has tried Wellbutrin without success.  She informed Clinical research associate that it increases her appetite.  Provider informed patient that Wellbutrin generally reduces appetite and asked her if she continues to smoke marijuana.  She informed Clinical research associate that she has been off of marijuana for 3 weeks as it causes her GI upset.  Today patient agreeable to starting Intuniv 2 mg to help manage symptoms of ADHD.  Provider also informed patient of Vaughan Basta however she was not interested.Potential side effects of medication and risks vs benefits of treatment vs non-treatment were explained and discussed. All questions were answered.  No other concerns noted at this time.

## 2023-08-09 ENCOUNTER — Encounter: Payer: Self-pay | Admitting: Dietician

## 2023-08-09 ENCOUNTER — Encounter: Payer: Medicaid Other | Attending: Psychiatry | Admitting: Dietician

## 2023-08-09 DIAGNOSIS — Z713 Dietary counseling and surveillance: Secondary | ICD-10-CM | POA: Diagnosis not present

## 2023-08-09 DIAGNOSIS — R635 Abnormal weight gain: Secondary | ICD-10-CM | POA: Insufficient documentation

## 2023-08-09 NOTE — Progress Notes (Signed)
Medical Nutrition Therapy  Appointment Start time:  09:15  Appointment End time:  10:30  Primary concerns today: Pt states primary concern is discussing how to increase metabolism  Referral diagnosis: R63.5 (ICD-10-CM) - Weight gain  Referring provider: Shanna Cisco, NP Preferred learning style: no preference indicated Learning readiness: ready   NUTRITION ASSESSMENT   Subjective: At appointment today, Nancy Dickerson raises concerns for weight gain and with feeling constantly fatigued and is exploring what can be done from a nutrition/lifestyle approach to address this .   Pt states that she has tried weight loss medications (phentermine and Wellbutrin) to suppress appetite, but discontinued use after experiences unpleasant side effects (depression/somberness; increased hunger). She has tried medications to aid sleep but also discontinued use due to side effects. Pt notes that generally, she prefers herbal/holistic medicine and approaches vs pharmaceutical and noted that she previously did  a DNA test to help determine what types of medications work well with her body. States that she avoids medications that may increase risk of weight gain.  Pt noted that taking iron supplement has helped her a little with having more energy, but wishes it were better- pt states that she thinks getting better sleep might improve energy levels and notes that use of CPAP decreases quality of sleep and states that reducing her weight might help reduce need for CPAP.  Recently experiencing COPD exacerbation.  Anthropometrics 08/09/23 Ht: - Wt: 275.8 lb  Clinical - Pertinent Medical Hx: OCD, COPD, stroke, anemia r/t chronic blood loss, chronic pain, insomnia, sleep apnea, PICA - Medications:  Currently taking- Iron (2 x 65 mg daily), One-a-day vitamin, Albuterol as needed, Gabapentin "here and there" Previously taking- Wellbutrin, Phentermine, Elavil  Newly prescribed and not yet taking- Buspar,  guanfacine, prednisone  Labs:   Latest Reference Range & Units Most Recent  Iron Saturation 15 - 55 % 10 (L) 10/11/21 15:54    Latest Reference Range & Units Most Recent  Hemoglobin 11.1 - 15.9 g/dL 8.6 (L) 10/14/08 96:04  HGB 12.0 - 16.0 g/dL 9.4 (L) 5/40/98 11:91  HCT 34.0 - 46.6 % 29.1 (L) 05/15/23 16:18  HCT 35.0 - 47.0 % 30.8 (L) 10/25/14 14:28  MCV 79 - 97 fL 73 (L) 05/15/23 16:18  MCH 26.6 - 33.0 pg 21.7 (L) 05/15/23 16:18  MCHC 31.5 - 35.7 g/dL 47.8 (L) 11/17/54 21:30  RDW 11.7 - 15.4 % 17.4 (H) 05/15/23 16:18    Latest Reference Range & Units Most Recent  Glucose-Capillary 70 - 99 mg/dL 865 (H) 7/84/69 62:95    Latest Reference Range & Units Most Recent 07/08/18 10:42 06/03/20 12:03 01/08/23 04:46  Hemoglobin A1C 4.8 - 5.6 % 5.6 01/08/23 04:46 5.3 5.7 (H) 5.6  (H): Data is abnormally high (L): Data is abnormally low   Notable Signs/Symptoms: Pt notes abnormal hair growth and questions PCOS; Green stool possibly r/t iron supplementation; fatigue r/t anemia, sleep apnea; inability to tolerate drinking water Food Allergies:-   Lifestyle & Dietary Hx May not eat in mornings depending on appetite, may have 3 bacon and 2 boiled eggs OR leftovers or fast food. Snacks through the day (ex: chicken salad home made), loves spanish food . Not usually big meals; some days does not have appetite.  Says she has tried the "fruit diet"; makes smoothies at home with elderberry, sea moss, chia flax, etc. Notes that she watches sodium intake at home (uses sodium-free seasoning) and prefers to bake vs fry.  Note: pt reports hx of vomiting after  drinking plain water- she states that this started after infection with Staph. aureus during/after a prior pregnancy and/or taking antibiotics r/t to the infection. Currently makes juices at home with a variety of fruits and will buy organic juices at the store. Notes drinks soda on occasion. Notes that she is cautious of drinking bottled waters and will not  drink tap water. Drinks wine on occasion.  - Estimated daily fluid intake: pt states she drinks little throughout the day as fluids often sit on her stomach and cause discomfort. - Supplements:  elderberry, sea moss, re-review on follow-up  - Sleep: insomnia, uses CPAP, - Stress / self-care: Pt describes general stress associated with current social/political environment; pt worries for safety of children and family members; pt carries burden of responsibility others in the family and notes the mother is currently battling cancer. 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 Notes she wil often stay home to avoid some social situations. Is currently seeing a therapist - Current average weekly physical activity: limited d/t fatigue and chronic pain  24-Hr Dietary Recall First Meal 11:30-12: cheese fries and bacon, blueberry pomegranite juice Snack: - Second Meal 1:30: burger from [employer]: - Third Meal 8: 4 tacos- steak, queso Snack: - Beverages:   GI/GU: green stool r/t iron supp.   NUTRITION DIAGNOSIS  NB-1.1 Food and nutrition-related knowledge deficit As related to lack of prior food and nutrition counseling.  As evidenced by pt reported not have previously received nutrition counseling from a registered dietitian.   NUTRITION INTERVENTION  Nutrition education (E-1) on the following topics:   - It is important to make sure we are eating enough throughout the day- this helps our bodies regulate appetite, metabolism and energy throughout the day Strive to maintain consistent eating- general recommendation is for 3 meals a day (each spaced about 4-5 hours apart); if not hungry for a larger meal, you could consider doing smaller, more frequent meals (eating about every 3 hours). Not all meals have to be large, but strive to incorporate various food groups at most eating occasion Small meals may  resemble a balanced snack; try to incorporate a source of protein (lowfat dairy like yogurt or cheese, nuts and seeds, beans, lentils, tuna packets, grilled chicken, lean beef, lower sodium deli slices, humus, etc) and a complex carbohydrate such as fruits, whole grains (pop corn, whole grain crackers/bread/granola/oat bars, starchy and non-starchy vegetables) to make a balanced snack pairing.  - It is important to make sure we are staying hydrated, maintaining energy levels,, regulating temperature and blood pressure Adequate intake of fluids can come from water, fruits, vegetables, broths, teas, juices, and most other beverages Try to prioritize drinks that are lower in sugar; if drinking juice, consider diluting with water, seltzer, or a sugar free electrolyte beverage (ex: pedialyte or gatorade)  - Try to incorporate fruits and vegetables with most meals as these are rich in nutrients including fiber, and are very hydrating, both components which can help to alleviate constipation and contribute to regular bowel movements.  - Dietary fiber is essential for health and comes in two types: soluble and insoluble fiber. Soluble Fiber: Characteristics: Dissolves in water, forming a gel-like substance. Sources: Oats, nuts, seeds, beans, lentils, fruits (apples, citrus), and vegetables (carrots). Benefits: Regulates blood sugar, lowers LDL cholesterol, supports heart health, and aids in digestion by forming a gel that prevents diarrhea. Insoluble Fiber: Characteristics: Does  not dissolve in water and adds bulk to stool. Sources: Whole grains, bran, nuts, seeds, vegetables (cauliflower, green beans), and fruits (apples with skin, berries). Benefits: Promotes regular bowel movements, aids in weight management, and can reduce risk for intestinal disease.    Handouts Provided Include  MyPlate food groups examples list  Learning Style & Readiness for Change Teaching method utilized: Visual & Auditory   Demonstrated degree of understanding via: Teach Back  Barriers to learning/adherence to lifestyle change:   Goals Established by Pt 1) lets try to get more hydrating fluids in throughout the day Ex: mixing seltzer, or pedialyte, or coconut water, to mix with juice. See if any of these options help  2) try to have at least 3 meals meals a day- or try 4-5 smaller, meals Try to have one fruit or vegetable with most meals    MONITORING & EVALUATION Dietary intake, fluid consumption, physical activity and daily energy levels in 4 weeks.  Next Steps  Patient is to call for questions.

## 2023-08-09 NOTE — Patient Instructions (Addendum)
Goals:  1) lets try to get more hydrations Ex: mixing seltzer, or pedialyte, or coconut water, to mix with juice. See if any of these options help  2) try to have at least 3 meals meals a day- or try 4-5 smaller, meals Try to have one fruit or vegetable with meals

## 2023-08-13 ENCOUNTER — Telehealth: Payer: Medicaid Other | Admitting: Physician Assistant

## 2023-08-13 DIAGNOSIS — R110 Nausea: Secondary | ICD-10-CM | POA: Diagnosis not present

## 2023-08-13 DIAGNOSIS — R030 Elevated blood-pressure reading, without diagnosis of hypertension: Secondary | ICD-10-CM

## 2023-08-13 MED ORDER — HYDROCHLOROTHIAZIDE 25 MG PO TABS
25.0000 mg | ORAL_TABLET | Freq: Every day | ORAL | 0 refills | Status: AC
Start: 2023-08-13 — End: ?

## 2023-08-13 MED ORDER — ONDANSETRON HCL 4 MG PO TABS: 4.0000 mg | ORAL_TABLET | Freq: Three times a day (TID) | ORAL | 0 refills | Status: DC | PRN

## 2023-08-13 NOTE — Patient Instructions (Signed)
Dorna Leitz, thank you for joining Margaretann Loveless, PA-C for today's virtual visit.  While this provider is not your primary care provider (PCP), if your PCP is located in our provider database this encounter information will be shared with them immediately following your visit.   A [facility] MyChart account gives you access to today's visit and all your visits, tests, and labs performed at [facility] " click here if you don't have a [facility] MyChart account or go to [email or web address]  Consent: (Patient) Nancy Dickerson provided verbal consent for this virtual visit at the beginning of the encounter.  Current Medications:  Current Outpatient Medications:    hydrochlorothiazide (HYDRODIURIL) 25 MG tablet, Take 1 tablet (25 mg total) by mouth daily., Disp: 7 tablet, Rfl: 0   ondansetron (ZOFRAN) 4 MG tablet, Take 1 tablet (4 mg total) by mouth every 8 (eight) hours as needed for nausea or vomiting., Disp: 20 tablet, Rfl: 0   albuterol (PROVENTIL) (2.5 MG/3ML) 0.083% nebulizer solution, 2.5 mg every 6 (six) hours as needed. (Patient taking differently: Take 2.5 mg by nebulization every 6 (six) hours as needed for wheezing or shortness of breath. 2.5 mg every 6 (six) hours as needed.), Disp: 75 mL, Rfl: 1   albuterol (VENTOLIN HFA) 108 (90 Base) MCG/ACT inhaler, Inhale 2 puffs into the lungs every 6 (six) hours as needed for wheezing or shortness of breath., Disp: 8 g, Rfl: 2   albuterol (VENTOLIN HFA) 108 (90 Base) MCG/ACT inhaler, Inhale 2 puffs into the lungs every 6 (six) hours as needed for wheezing or shortness of breath., Disp: 8 g, Rfl: 0   aspirin EC 81 MG tablet, Take 1 tablet (81 mg total) by mouth daily. Swallow whole., Disp: 30 tablet, Rfl: 12   atorvastatin (LIPITOR) 20 MG tablet, Take 1 tablet (20 mg total) by mouth daily., Disp: 30 tablet, Rfl: 0   busPIRone (BUSPAR) 10 MG tablet, Take 1 tablet (10 mg total) by mouth 3 (three) times daily., Disp: 90  tablet, Rfl: 3   gabapentin (NEURONTIN) 300 MG capsule, Take 1 capsule (300 mg total) by mouth 3 (three) times daily., Disp: 90 capsule, Rfl: 3   guanFACINE (INTUNIV) 2 MG TB24 ER tablet, Take 1 tablet (2 mg total) by mouth daily., Disp: 30 tablet, Rfl: 3   methocarbamol (ROBAXIN) 500 MG tablet, Take 1 tablet (500 mg total) by mouth 2 (two) times daily as needed for muscle spasms., Disp: 20 tablet, Rfl: 0   polyethylene glycol (MIRALAX) 17 g packet, Take 17 g by mouth daily., Disp: 14 each, Rfl: 0   Suvorexant (BELSOMRA) 10 MG TABS, Take 1 tablet (10 mg total) by mouth at bedtime as needed., Disp: 15 tablet, Rfl: 2   Medications ordered in this encounter:  Meds ordered this encounter  Medications   hydrochlorothiazide (HYDRODIURIL) 25 MG tablet    Sig: Take 1 tablet (25 mg total) by mouth daily.    Dispense:  7 tablet    Refill:  0    Order Specific Question:   Supervising Provider    Answer:   LAMPTEY, PHILIP O [[id number]]   ondansetron (ZOFRAN) 4 MG tablet    Sig: Take 1 tablet (4 mg total) by mouth every 8 (eight) hours as needed for nausea or vomiting.    Dispense:  20 tablet    Refill:  0    Order Specific Question:   Supervising Provider    Answer:   Merrilee Jansky [id number]     *  If you need refills on other medications prior to your next appointment, please contact your pharmacy*  Follow-Up: Call back or seek an in-person evaluation if the symptoms worsen or if the condition fails to improve as anticipated.   Virtual Care [phone]  Other Instructions Preventing Hypertension Hypertension, also called high blood pressure, is when the force of blood pumping through the arteries is too strong. Arteries are blood vessels that carry blood from the heart throughout the body. Often, hypertension does not cause symptoms until blood pressure is very high. It is important to have your blood pressure checked regularly. Diet and lifestyle changes can help you prevent  hypertension, and they may make you feel better overall and improve your quality of life. If you already have hypertension, you may control it with diet and lifestyle changes, as well as with medicine. How can this condition affect me? Over time, hypertension can damage the arteries and decrease blood flow to important parts of the body, including the brain, heart, and kidneys. By keeping your blood pressure in a healthy range, you can help prevent complications like heart attack, heart failure, stroke, kidney failure, and vascular dementia. What can increase my risk? An unhealthy diet and a lack of physical activity can make you more likely to develop high blood pressure. Some other risk factors include: Age. The risk increases with age. Having family members who have had high blood pressure. Having certain health conditions, such as thyroid problems. Being overweight or obese. Drinking too much alcohol or caffeine. Having too much fat, sugar, calories, or salt (sodium) in your diet. Smoking or using illegal drugs. Taking certain medicines, such as antidepressants, decongestants, birth control pills, and NSAIDs, such as ibuprofen. What actions can I take to prevent or manage this condition? Work with your health care provider to make a hypertension prevention plan that works for you. You may be referred for counseling on a healthy diet and physical activity. Follow your plan and keep all follow-up visits. Diet changes Maintain a healthy diet. This includes: Eating less salt (sodium). Ask your health care provider how much sodium is safe for you to have. The general recommendation is to have less than 1 tsp (2,300 mg) of sodium a day. Do not add salt to your food. Choose low-sodium options when grocery shopping and eating out. Limiting fats in your diet. You can do this by eating low-fat or fat-free dairy products and by eating less red meat. Eating more fruits, vegetables, and whole grains.  Make a goal to eat: 1-2 cups of fresh fruits and vegetables each day. 3-4 servings of whole grains each day. Avoiding foods and beverages that have added sugars. Eating fish that contain healthy fats (omega-3 fatty acids), such as mackerel or salmon. If you need help putting together a healthy eating plan, try the DASH diet. This diet is high in fruits, vegetables, and whole grains. It is low in sodium, red meat, and added sugars. DASH stands for Dietary Approaches to Stop Hypertension. Lifestyle changes  Lose weight if you are overweight. Losing just 3-5% of your body weight can help prevent or control hypertension. For example, if your present weight is 200 lb (91 kg), a loss of 3-5% of your weight means losing 6-10 lb (2.7-4.5 kg). Ask your health care provider to help you with a diet and exercise plan to safely lose weight. Get enough exercise. Do at least 150 minutes of moderate-intensity exercise each week. You could do this in short exercise  sessions several times a day, or you could do longer exercise sessions a few times a week. For example, you could take a brisk 10-minute walk or bike ride, 3 times a day, for 5 days a week. Find ways to reduce stress, such as exercising, meditating, listening to music, or taking a yoga class. If you need help reducing stress, ask your health care provider. Do not use any products that contain nicotine or tobacco. These products include cigarettes, chewing tobacco, and vaping devices, such as e-cigarettes. Chemicals in tobacco and nicotine products raise your blood pressure each time you use them. If you need help quitting, ask your health care provider. Learn how to check your blood pressure at home. Make sure that you know your personal target blood pressure, as told by your health care provider. Try to sleep 7-9 hours per night. Alcohol use Do not drink alcohol if: Your health care provider tells you not to drink. You are pregnant, may be pregnant, or  are planning to become pregnant. If you drink alcohol: Limit how much you have to: 0-1 drink a day for women. 0-2 drinks a day for men. Know how much alcohol is in your drink. In the [place] one drink equals one 12 oz bottle of beer (355 mL), one 5 oz glass of wine (148 mL), or one 1 oz glass of hard liquor (44 mL). Medicines In addition to diet and lifestyle changes, your health care provider may recommend medicines to help lower your blood pressure. In general: You may need to try a few different medicines to find what works best for you. You may need to take more than one medicine. Take over-the-counter and prescription medicines only as told by your health care provider. Questions to ask your health care provider What is my blood pressure goal? How can I lower my risk for high blood pressure? How should I monitor my blood pressure at home? Where to find support Your health care provider can help you prevent hypertension and help you keep your blood pressure at a healthy level. Your local hospital or your community may also provide support services and prevention programs. The American Heart Association offers an online support network at supportnetwork.heart.org Where to find more information Learn more about hypertension from: National Heart, Lung, and Blood Institute: PopSteam.is Centers for Disease Control and Prevention: FootballExhibition.com.br American Academy of Family Physicians: familydoctor.org Learn more about the DASH diet from: National Heart, Lung, and Blood Institute: PopSteam.is Contact a health care provider if: You think you are having a reaction to medicines you have taken. You have recurrent headaches or feel dizzy. You have swelling in your ankles. You have trouble with your vision. Get help right away if: You have sudden, severe chest, back, or abdominal pain or discomfort. You have shortness of breath. You have a sudden, severe headache. These symptoms may  be an emergency. Get help right away. Call 911. Do not wait to see if the symptoms will go away. Do not drive yourself to the hospital. Summary Hypertension often does not cause any symptoms until blood pressure is very high. It is important to get your blood pressure checked regularly. Diet and lifestyle changes are important steps in preventing hypertension. By keeping your blood pressure in a healthy range, you may prevent complications like heart attack, heart failure, stroke, and kidney failure. Work with your health care provider to make a hypertension prevention plan that works for you. This information is not intended to replace advice given to  you by your health care provider. Make sure you discuss any questions you have with your health care provider. Document Revised: 07/14/2021 Document Reviewed: 07/14/2021 Elsevier Patient Education  2024 Elsevier Inc.    If you have been instructed to have an in-person evaluation today at a local Urgent Care facility, please use the link below. It will take you to a list of all of our available [facility], including address, phone number and hours of operation. Please do not delay care.  [facility]  If you or a family member do not have a primary care provider, use the link below to schedule a visit and establish care. When you choose a [facility] primary care physician or advanced practice provider, you gain a long-term partner in health. Find a Primary Care Provider  Learn more about [facility]'s in-office and virtual care options: [facility] - Get Care Now

## 2023-08-13 NOTE — Progress Notes (Signed)
Virtual Visit Consent   Nancy Dickerson, you are scheduled for a virtual visit with a [facility] provider today. Just as with appointments in the office, your consent must be obtained to participate. Your consent will be active for this visit and any virtual visit you may have with one of our providers in the next 365 days. If you have a MyChart account, a copy of this consent can be sent to you electronically.  As this is a virtual visit, video technology does not allow for your provider to perform a traditional examination. This may limit your provider's ability to fully assess your condition. If your provider identifies any concerns that need to be evaluated in person or the need to arrange testing (such as labs, EKG, etc.), we will make arrangements to do so. Although advances in technology are sophisticated, we cannot ensure that it will always work on either your end or our end. If the connection with a video visit is poor, the visit may have to be switched to a telephone visit. With either a video or telephone visit, we are not always able to ensure that we have a secure connection.  By engaging in this virtual visit, you consent to the provision of healthcare and authorize for your insurance to be billed (if applicable) for the services provided during this visit. Depending on your insurance coverage, you may receive a charge related to this service.  I need to obtain your verbal consent now. Are you willing to proceed with your visit today? Nancy Dickerson has provided verbal consent on 08/13/2023 for a virtual visit (video or telephone). Margaretann Loveless, PA-C  Date: 08/13/2023 4:53 PM  Virtual Visit via Video Note   I, Margaretann Loveless, connected with  Nancy Dickerson  ([id number], 1981-01-18) on 08/13/23 at  4:30 PM EST by a video-enabled telemedicine application and verified that I am speaking with the correct person using two identifiers.  Location: Patient: Virtual Visit Location  Patient: Home Provider: Virtual Visit Location Provider: Home Office   I discussed the limitations of evaluation and management by telemedicine and the availability of in person appointments. The patient expressed understanding and agreed to proceed.    History of Present Illness: Nancy Dickerson is a 42 y.o. who identifies as a female who was assigned female at birth, and is being seen today for elevated blood pressure of 127/96. Was recently treated (08/07/23), virtual for COPD exacerbation with Albuterol and Prednisone 40mg  x 5 days, still taking because she did get a little late.   Today she has had a headache over the left eye. She took her BP and it was 127/96 and 130/97. She feels fatigued. States she feels hot to touch, but her kids have told her she did not. Having some blurry vision. She has some mild associated nausea. No vomiting. She does work Clinical biochemist for [employer] and was able to work all day, but staring a computer screen has worsened her blurry vision and headache. She also reports her eyelids feel heavy. She has no history of elevated blood pressure.   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   Chest congestion 07/15/2021   Acute cough 07/15/2021   Tachycardia 06/01/2021   SOB (shortness of breath) 06/01/2021   Attention deficit hyperactivity disorder (ADHD), combined type 01/13/2021  Chronic obstructive pulmonary disease (HCC) 11/26/2020   Moderate episode of recurrent major depressive disorder (HCC) 10/14/2020   Generalized anxiety disorder 10/14/2020   Dysphonia 08/25/2020   GERD (gastroesophageal reflux disease) 08/25/2020   Pulsatile tinnitus of right ear 08/25/2020   Dysfunctional uterine bleeding 06/03/2020   Patellofemoral pain syndrome of left knee 11/08/2019   Acute pain of left knee 08/13/2019    Insomnia 07/07/2019   Lumbar radicular pain 07/07/2019   Uterine leiomyoma 03/12/2018   Trochanteric bursitis of right hip 03/12/2018   Class 3 severe obesity due to excess calories without serious comorbidity with body mass index (BMI) of 40.0 to 44.9 in adult [facility]) 03/12/2018   Iron deficiency anemia due to chronic blood loss 01/29/2018   Right sided weakness 01/29/2018    Allergies: No Known Allergies Medications:  Current Outpatient Medications:    hydrochlorothiazide (HYDRODIURIL) 25 MG tablet, Take 1 tablet (25 mg total) by mouth daily., Disp: 7 tablet, Rfl: 0   ondansetron (ZOFRAN) 4 MG tablet, Take 1 tablet (4 mg total) by mouth every 8 (eight) hours as needed for nausea or vomiting., Disp: 20 tablet, Rfl: 0   albuterol (PROVENTIL) (2.5 MG/3ML) 0.083% nebulizer solution, 2.5 mg every 6 (six) hours as needed. (Patient taking differently: Take 2.5 mg by nebulization every 6 (six) hours as needed for wheezing or shortness of breath. 2.5 mg every 6 (six) hours as needed.), Disp: 75 mL, Rfl: 1   albuterol (VENTOLIN HFA) 108 (90 Base) MCG/ACT inhaler, Inhale 2 puffs into the lungs every 6 (six) hours as needed for wheezing or shortness of breath., Disp: 8 g, Rfl: 2   albuterol (VENTOLIN HFA) 108 (90 Base) MCG/ACT inhaler, Inhale 2 puffs into the lungs every 6 (six) hours as needed for wheezing or shortness of breath., Disp: 8 g, Rfl: 0   aspirin EC 81 MG tablet, Take 1 tablet (81 mg total) by mouth daily. Swallow whole., Disp: 30 tablet, Rfl: 12   atorvastatin (LIPITOR) 20 MG tablet, Take 1 tablet (20 mg total) by mouth daily., Disp: 30 tablet, Rfl: 0   busPIRone (BUSPAR) 10 MG tablet, Take 1 tablet (10 mg total) by mouth 3 (three) times daily., Disp: 90 tablet, Rfl: 3   gabapentin (NEURONTIN) 300 MG capsule, Take 1 capsule (300 mg total) by mouth 3 (three) times daily., Disp: 90 capsule, Rfl: 3   guanFACINE (INTUNIV) 2 MG TB24 ER tablet, Take 1 tablet (2 mg total) by mouth daily., Disp: 30  tablet, Rfl: 3   methocarbamol (ROBAXIN) 500 MG tablet, Take 1 tablet (500 mg total) by mouth 2 (two) times daily as needed for muscle spasms., Disp: 20 tablet, Rfl: 0   polyethylene glycol (MIRALAX) 17 g packet, Take 17 g by mouth daily., Disp: 14 each, Rfl: 0   Suvorexant (BELSOMRA) 10 MG TABS, Take 1 tablet (10 mg total) by mouth at bedtime as needed., Disp: 15 tablet, Rfl: 2  Observations/Objective: Patient is well-developed, well-nourished in no acute distress.  Resting comfortably at home.  Head is normocephalic, atraumatic.  No labored breathing.  Speech is clear and coherent with logical content.  Patient is alert and oriented at baseline.    Assessment and Plan: 1. Elevated blood pressure reading - hydrochlorothiazide (HYDRODIURIL) 25 MG tablet; Take 1 tablet (25 mg total) by mouth daily.  Dispense: 7 tablet; Refill: 0  2. Nausea - ondansetron (ZOFRAN) 4 MG tablet; Take 1 tablet (4 mg total) by mouth every 8 (eight) hours as needed for nausea or vomiting.  Dispense: 20 tablet; Refill: 0  - Suspect possible migraine vs side effect of prednisone and albuterol vs new onset hypertension - Will give trial HCTZ x 7 days - If BP remains elevated after completing will need to follow up in person with PCP for further work up for HTN - Strict ER precautions discussed verbally and she voiced understanding - Follow up in person if not improving  Follow Up Instructions: I discussed the assessment and treatment plan with the patient. The patient was provided an opportunity to ask questions and all were answered. The patient agreed with the plan and demonstrated an understanding of the instructions.  A copy of instructions were sent to the patient via MyChart unless otherwise noted below.    The patient was advised to call back or seek an in-person evaluation if the symptoms worsen or if the condition fails to improve as anticipated.    Margaretann Loveless, PA-C

## 2023-08-29 ENCOUNTER — Telehealth (HOSPITAL_COMMUNITY): Payer: Medicaid Other | Admitting: Psychiatry

## 2023-09-03 ENCOUNTER — Ambulatory Visit: Payer: Medicaid Other | Admitting: Dietician

## 2023-10-09 ENCOUNTER — Other Ambulatory Visit: Payer: Self-pay

## 2023-10-09 ENCOUNTER — Other Ambulatory Visit (HOSPITAL_BASED_OUTPATIENT_CLINIC_OR_DEPARTMENT_OTHER): Payer: Self-pay

## 2023-10-09 ENCOUNTER — Encounter: Payer: Self-pay | Admitting: Hematology

## 2023-10-09 ENCOUNTER — Telehealth (HOSPITAL_COMMUNITY): Payer: Medicaid Other | Admitting: Psychiatry

## 2023-10-09 ENCOUNTER — Encounter (HOSPITAL_COMMUNITY): Payer: Self-pay | Admitting: Psychiatry

## 2023-10-09 DIAGNOSIS — Z0000 Encounter for general adult medical examination without abnormal findings: Secondary | ICD-10-CM

## 2023-10-09 DIAGNOSIS — F411 Generalized anxiety disorder: Secondary | ICD-10-CM | POA: Diagnosis not present

## 2023-10-09 DIAGNOSIS — G4701 Insomnia due to medical condition: Secondary | ICD-10-CM | POA: Diagnosis not present

## 2023-10-09 MED ORDER — GABAPENTIN 300 MG PO CAPS
300.0000 mg | ORAL_CAPSULE | Freq: Three times a day (TID) | ORAL | 3 refills | Status: DC
Start: 2023-10-09 — End: 2023-12-12
  Filled 2023-10-09 – 2023-10-20 (×2): qty 90, 30d supply, fill #0

## 2023-10-09 MED ORDER — BUSPIRONE HCL 10 MG PO TABS
10.0000 mg | ORAL_TABLET | Freq: Three times a day (TID) | ORAL | 3 refills | Status: DC
Start: 2023-10-09 — End: 2023-12-12
  Filled 2023-10-09: qty 90, 30d supply, fill #0

## 2023-10-09 MED ORDER — BELSOMRA 10 MG PO TABS
10.0000 mg | ORAL_TABLET | Freq: Every evening | ORAL | 2 refills | Status: DC | PRN
Start: 2023-10-09 — End: 2023-12-12
  Filled 2023-10-09: qty 30, 30d supply, fill #0

## 2023-10-09 NOTE — Progress Notes (Signed)
 [facility] MD/PA/NP OP Progress Note Virtual Visit via Video Note  I connected with Nancy Dickerson on 08/17/2021 at  3:00 PM EST by a video enabled telemedicine application and verified that I am speaking with the correct person using two identifiers.  Location: Patient:Work Provider: Clinic   I discussed the limitations of evaluation and management by telemedicine and the availability of in person appointments. The patient expressed understanding and agreed to proceed.  I provided 30 minutes of non-face-to-face time during this encounter.    10/09/2023 9:14 AM [place] Nancy Dickerson  MRN:  [id number]  Chief Complaint:  My insurance changed so I did not get some medications  HPI: 42 year old female seen today for follow up psychiatric evaluation.  She has psychiatric history of anxiety, depression, and insomnia. She is currently managed on gabapentin  300 mg 3 times daily,Buspar  10 mg three times daily, Wellbutrin  XL 300 mg, Belsomra  10 mg nightly. She notes that her medications are somewhat effective in managing her psychiatric conditions.    Today, patient was well groomed, pleasant, cooperative, engaged in conversation and maintained eye contact. She informed provider that her insurance recently changed and notes that she did not receive her BuSpar  or Belsomra .  Patient informed clinical research associate that she takes Wellbutrin  periodically as she finds it ineffective in managing her concentration for weight.  Patient was recently seen by a nutritionist however she found that ineffective.  She does note that her depression has somewhat improved.  Today provider conducted a GAD-7 and patient scored a 9, at her last visit she scored a 9.  Provider also conducted PHQ-9 and patient reported 8, at her last visit she scored a 10.  She notes that she continues to have poor sleep noting that she sleeps 5 hours nightly.  She endorses adequate appetite.  Today she denies SI/HI/VAH, mania, paranoia.  Patient informed writer that she  continues to have heavy menstrual cycles, pain, and issues with COPD. Recently she had a COPD exacerbation.  Currently she is without a PCP and notes that gabapentin  is not as effective in managing her pain.  Patient referred to community health and wellness for primary care.  Today patient agreeable to restarting BuSpar  10 mg 3 times daily to help manage anxiety and depression.  Se is also agreeable to  restarted on Belsomra  10 mg nightly as needed.  Potential side effects of medication and risks vs benefits of treatment vs non-treatment were explained and discussed. All questions were answered.She will continue her other medications as prescribed.     Visit Diagnosis:    ICD-10-CM   1. 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          Past Psychiatric History: Anxiety, Depression, and insomnia    Past Medical History:  Past Medical History:  Diagnosis Date   Anemia    Asthma    Bursitis of hip    Cardiomegaly    Chondromalacia of hip    Chondromalacia of right knee    COPD (chronic obstructive pulmonary disease) (HCC)    GERD (gastroesophageal reflux disease)    H/O transfusion of packed red blood cells    Tinnitus     Past Surgical History:  Procedure Laterality Date   CERVICAL CONIZATION W/BX N/A 06/25/2019   Procedure: CONIZATION CERVIX WITH BIOPSY - COLD KNIFE;  Surgeon: Starla Harland BROCKS, MD;  Location: [facility];  Service: Gynecology;  Laterality: N/A;   ENDOMETRIAL ABLATION  N/A 06/25/2019   Procedure: MINERVA ABLATION;  Surgeon: Starla Harland BROCKS, MD;  Location: [facility];  Service: Gynecology;  Laterality: N/A;  Minerva rep will here confirmed on 06/20/19 CS   HYSTEROSCOPY WITH D & C  06/25/2019   Procedure: DILATATION AND CURETTAGE /HYSTEROSCOPY;  Surgeon: Starla Harland BROCKS, MD;  Location: [facility];  Service: Gynecology;;   NO PAST SURGERIES      Family Psychiatric History: Daughter SA,  Mother Bipolar disorder, and sister Bipolar disorder  Family History:  Family History  Problem Relation Age of Onset   Sudden death Father 71   Asthma Sister    Asthma Son     Social History:  Social History   Socioeconomic History   Marital status: Single    Spouse name: Not on file   Number of children: 6   Years of education: 12   Highest education level: Not on file  Occupational History   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  Tobacco Use   Smoking status: Never   Smokeless tobacco: Never  Vaping Use   Vaping status: Never Used  Substance and Sexual Activity   Alcohol use: Yes    Comment: rare   Drug use: Yes    Types: Marijuana    Comment: Occasionally   Sexual activity: Yes  Other Topics Concern   Not on file  Social History Narrative   ** Merged History Encounter **         Lives with someone   Right handed    Little caffeine - drinks Sprite   Education - some college          Social Drivers of Health   Financial Resource Strain: Not on file  Food Insecurity: No Food Insecurity (08/09/2023)   Hunger Vital Sign    Worried About Running Out of Food in the Last Year: Never true    Ran Out of Food in the Last Year: Never true  Transportation Needs: No Transportation Needs (10/06/2021)   PRAPARE - Administrator, Civil Service (Medical): No    Lack of Transportation (Non-Medical): No  Physical Activity: Not on file  Stress: Not on file  Social Connections: Unknown (02/06/2022)   [facility]   Social Network    Social Network: Not on file    Allergies: No Known Allergies  Metabolic Disorder Labs: Lab Results  Component Value Date   HGBA1C 5.6 01/08/2023   MPG 114 01/08/2023   MPG 99.67 01/30/2018   No results found for: PROLACTIN Lab Results  Component Value Date   CHOL 157 09/08/2021   TRIG 83 09/08/2021   HDL 50 09/08/2021   CHOLHDL 3.1 09/08/2021   VLDL 18 01/30/2018   LDLCALC 91 09/08/2021   LDLCALC 58  01/30/2018   Lab Results  Component Value Date   TSH 1.353 01/08/2023   TSH 0.665 09/08/2021    Therapeutic Level Labs: No results found for: LITHIUM No results found for: VALPROATE No results found for: CBMZ  Current Medications: Current Outpatient Medications  Medication Sig Dispense Refill   albuterol  (PROVENTIL ) (2.5 MG/3ML) 0.083% nebulizer solution 2.5 mg every 6 (six) hours as needed. (Patient taking differently: Take 2.5 mg by nebulization every 6 (six) hours as needed for wheezing or shortness of breath. 2.5 mg every 6 (six) hours as needed.) 75 mL 1   albuterol  (VENTOLIN  HFA) 108 (90 Base) MCG/ACT inhaler Inhale 2 puffs into the lungs every 6 (six) hours as  needed for wheezing or shortness of breath. 8 g 2   albuterol  (VENTOLIN  HFA) 108 (90 Base) MCG/ACT inhaler Inhale 2 puffs into the lungs every 6 (six) hours as needed for wheezing or shortness of breath. 8 g 0   aspirin  EC 81 MG tablet Take 1 tablet (81 mg total) by mouth daily. Swallow whole. 30 tablet 12   atorvastatin  (LIPITOR) 20 MG tablet Take 1 tablet (20 mg total) by mouth daily. 30 tablet 0   busPIRone  (BUSPAR ) 10 MG tablet Take 1 tablet (10 mg total) by mouth 3 (three) times daily. 90 tablet 3   gabapentin  (NEURONTIN ) 300 MG capsule Take 1 capsule (300 mg total) by mouth 3 (three) times daily. 90 capsule 3   guanFACINE  (INTUNIV ) 2 MG TB24 ER tablet Take 1 tablet (2 mg total) by mouth daily. 30 tablet 3   hydrochlorothiazide  (HYDRODIURIL ) 25 MG tablet Take 1 tablet (25 mg total) by mouth daily. 7 tablet 0   methocarbamol  (ROBAXIN ) 500 MG tablet Take 1 tablet (500 mg total) by mouth 2 (two) times daily as needed for muscle spasms. 20 tablet 0   ondansetron  (ZOFRAN ) 4 MG tablet Take 1 tablet (4 mg total) by mouth every 8 (eight) hours as needed for nausea or vomiting. 20 tablet 0   polyethylene glycol (MIRALAX ) 17 g packet Take 17 g by mouth daily. 14 each 0   Suvorexant  (BELSOMRA ) 10 MG TABS Take 1 tablet (10 mg  total) by mouth at bedtime as needed. 15 tablet 2   No current facility-administered medications for this visit.     Musculoskeletal: Strength & Muscle Tone: within normal limits and  telehealth visit Gait & Station: normal, telehealth visit Patient leans: N/A  Psychiatric Specialty Exam: Review of Systems  There were no vitals taken for this visit.There is no height or weight on file to calculate BMI.  General Appearance: Well Groomed  Eye Contact:  Good  Speech:  Clear and Coherent and Normal Rate  Volume:  Normal  Mood:  Euthymic and  improvement  Affect:  Appropriate and Congruent  Thought Process:  Coherent, Goal Directed and Linear  Orientation:  Full (Time, Place, and Person)  Thought Content: WDL and Logical   Suicidal Thoughts:  No  Homicidal Thoughts:  No  Memory:  Immediate;   Good Recent;   Good Remote;   Good  Judgement:  Good  Insight:  Good  Psychomotor Activity:  Normal  Concentration:  Concentration: Good and Attention Span: Good  Recall:  Good  Fund of Knowledge: Good  Language: Good  Akathisia:  No  Handed:  Right  AIMS (if indicated): Not done  Assets:  Communication Skills Desire for Improvement Financial Resources/Insurance Housing Intimacy Physical Health Social Support  ADL's:  Intact  Cognition: WNL  Sleep:  Fair   Screenings: GAD-7    Flowsheet Row Video Visit from 10/09/2023 in [facility] Video Visit from 07/18/2023 in [facility] Video Visit from 06/20/2023 in [facility] Video Visit from 03/28/2023 in [facility] Office Visit from 01/16/2023 in [facility]  Total GAD-7 Score 9 9 15 8 7       PHQ2-9    Flowsheet Row Video Visit from 10/09/2023 in [facility] Video Visit from 07/18/2023 in [facility] Video Visit from 06/20/2023 in  [facility] Video Visit from 03/28/2023 in [facility]  Office Visit from 01/16/2023 in [facility]  PHQ-2 Total Score 1 1 3 2 2   PHQ-9 Total Score 8 10 12 5 13       Flowsheet Row ED from 05/15/2023 in [facility] Video Visit from 03/28/2023 in [facility] ED from 03/11/2023 in [facility])  C-SSRS RISK CATEGORY No Risk No Risk No Risk        Assessment and Plan: Patient notes that her anxiety and depression has somewhat improved but her sleep  and pain has not.  Currently she is without a PCP and notes that gabapentin  is not as effective in managing her pain.  Patient referred to community health and wellness for primary care.Today patient agreeable to restarting BuSpar  10 mg 3 times daily to help manage anxiety and depression.  Se is also agreeable to  restarted on Belsomra  10 mg nightly as needed.      1. Generalized anxiety disorder  Restart- busPIRone  (BUSPAR ) 10 MG tablet; Take 1 tablet (10 mg total) by mouth 3 (three) times daily.  Dispense: 90 tablet; Refill: 3 Continue- gabapentin  (NEURONTIN ) 300 MG capsule; Take 1 capsule (300 mg total) by mouth 3 (three) times daily.  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  Dispense: 30 tablet; Refill: 2  3. Well adult exam (Primary)  - Ambulatory referral to Internal Medicine   Follow up in 2.5 months  Zane FORBES Bach, NP 10/09/2023, 9:14 AM

## 2023-10-18 ENCOUNTER — Telehealth: Payer: Self-pay | Admitting: Physician Assistant

## 2023-10-18 ENCOUNTER — Other Ambulatory Visit: Payer: Self-pay

## 2023-10-18 ENCOUNTER — Encounter: Payer: Self-pay | Admitting: Hematology

## 2023-10-18 DIAGNOSIS — A084 Viral intestinal infection, unspecified: Secondary | ICD-10-CM

## 2023-10-18 MED ORDER — ONDANSETRON 4 MG PO TBDP
4.0000 mg | ORAL_TABLET | Freq: Three times a day (TID) | ORAL | 0 refills | Status: AC | PRN
Start: 2023-10-18 — End: ?
  Filled 2023-10-18 – 2024-02-27 (×4): qty 20, 4d supply, fill #0

## 2023-10-18 NOTE — Progress Notes (Signed)
 Virtual Visit Consent   Nancy Dickerson, you are scheduled for a virtual visit with a [facility] provider today. Just as with appointments in the office, your consent must be obtained to participate. Your consent will be active for this visit and any virtual visit you may have with one of our providers in the next 365 days. If you have a MyChart account, a copy of this consent can be sent to you electronically.  As this is a virtual visit, video technology does not allow for your provider to perform a traditional examination. This may limit your provider's ability to fully assess your condition. If your provider identifies any concerns that need to be evaluated in person or the need to arrange testing (such as labs, EKG, etc.), we will make arrangements to do so. Although advances in technology are sophisticated, we cannot ensure that it will always work on either your end or our end. If the connection with a video visit is poor, the visit may have to be switched to a telephone visit. With either a video or telephone visit, we are not always able to ensure that we have a secure connection.  By engaging in this virtual visit, you consent to the provision of healthcare and authorize for your insurance to be billed (if applicable) for the services provided during this visit. Depending on your insurance coverage, you may receive a charge related to this service.  I need to obtain your verbal consent now. Are you willing to proceed with your visit today? Jacolyn WRETHA LARIS has provided verbal consent on 10/18/2023 for a virtual visit (video or telephone). Delon [name], PA-C  Date: 10/18/2023 12:04 PM  Virtual Visit via Video Note   I, Delon [name], connected with  Nancy Dickerson  ([id number], January 08, 1981) on 10/18/23 at 11:00 AM EST by a video-enabled telemedicine application and verified that I am speaking with the correct person using two identifiers.  Location: Patient: Virtual Visit Location  Patient: Mobile Provider: Virtual Visit Location Provider: Home Office   I discussed the limitations of evaluation and management by telemedicine and the availability of in person appointments. The patient expressed understanding and agreed to proceed.    History of Present Illness: Nancy Dickerson is a 43 y.o. who identifies as a female who was assigned female at birth, and is being seen today for nausea and vomiting.  HPI: Emesis  This is a new problem. The current episode started today (overnight). Associated symptoms include abdominal pain, diarrhea, dizziness, headaches and sweats.   Called 9-1-1 last night due to severity of symptoms  Went to use the bathroom around 2am. Reports her body started feeling tingling every where, even in her mouth. Then she started to have sharp pains in her stomach. Reports stomach pain felt like someone was stabbing her.   Put cold water on her neck. Then was able to calm some, went to get her phone and returned to the bathroom to be on the cool vinyl floor  When she returned she Vomited 4 times, clear with white chunks. When she vomited she did have urinary and fecal incontinence.   She continued to feel like she was going to pass out and would put cold water on her neck to wake herself back up.  She tried to get her family to wake up but no one did so she called 9-1-1. Reports felt like she was going to die.   Her vitals from EMS:  BP: 120/90 HR  reported elevated with EMS, refused transport due to not liking hospitals  After they left she was able to rest some. She is feeling a little better this morning. Still having nausea and diarrhea. Still with some weakness and fatigue.  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   Chest  congestion 07/15/2021   Acute cough 07/15/2021   Tachycardia 06/01/2021   SOB (shortness of breath) 06/01/2021   Attention deficit hyperactivity disorder (ADHD), combined type 01/13/2021   Chronic obstructive pulmonary disease (HCC) 11/26/2020   Moderate episode of recurrent major depressive disorder (HCC) 10/14/2020   Generalized anxiety disorder 10/14/2020   Dysphonia 08/25/2020   GERD (gastroesophageal reflux disease) 08/25/2020   Pulsatile tinnitus of right ear 08/25/2020   Dysfunctional uterine bleeding 06/03/2020   Patellofemoral pain syndrome of left knee 11/08/2019   Acute pain of left knee 08/13/2019   Insomnia 07/07/2019   Lumbar radicular pain 07/07/2019   Uterine leiomyoma 03/12/2018   Trochanteric bursitis of right hip 03/12/2018   Class 3 severe obesity due to excess calories without serious comorbidity with body mass index (BMI) of 40.0 to 44.9 in adult (HCC) 03/12/2018   Iron deficiency anemia due to chronic blood loss 01/29/2018   Right sided weakness 01/29/2018    Allergies: No Known Allergies Medications:  Current Outpatient Medications:    ondansetron  (ZOFRAN -ODT) 4 MG disintegrating tablet, Take 1-2 tablets (4-8 mg total) by mouth every 8 (eight) hours as needed., Disp: 20 tablet, Rfl: 0   albuterol  (PROVENTIL ) (2.5 MG/3ML) 0.083% nebulizer solution, 2.5 mg every 6 (six) hours as needed. (Patient taking differently: Take 2.5 mg by nebulization every 6 (six) hours as needed for wheezing or shortness of breath. 2.5 mg every 6 (six) hours as needed.), Disp: 75 mL, Rfl: 1   albuterol  (VENTOLIN  HFA) 108 (90 Base) MCG/ACT inhaler, Inhale 2 puffs into the lungs every 6 (six) hours as needed for wheezing or shortness of breath., Disp: 8 g, Rfl: 2   albuterol  (VENTOLIN  HFA) 108 (90 Base) MCG/ACT inhaler, Inhale 2 puffs into the lungs every 6 (six) hours as needed for wheezing or shortness of breath., Disp: 8 g, Rfl: 0   aspirin  EC 81 MG tablet, Take 1 tablet (81 mg total) by  mouth daily. Swallow whole., Disp: 30 tablet, Rfl: 12   atorvastatin  (LIPITOR) 20 MG tablet, Take 1 tablet (20 mg total) by mouth daily., Disp: 30 tablet, Rfl: 0   busPIRone  (BUSPAR ) 10 MG tablet, Take 1 tablet (10 mg total) by mouth 3 (three) times daily., Disp: 90 tablet, Rfl: 3   gabapentin  (NEURONTIN ) 300 MG capsule, Take 1 capsule (300 mg total) by mouth 3 (three) times daily., Disp: 90 capsule, Rfl: 3   guanFACINE  (INTUNIV ) 2 MG TB24 ER tablet, Take 1 tablet (2 mg total) by mouth daily., Disp: 30 tablet, Rfl: 3   hydrochlorothiazide  (HYDRODIURIL ) 25 MG tablet, Take 1 tablet (25 mg total) by mouth daily., Disp: 7 tablet, Rfl: 0   methocarbamol  (ROBAXIN ) 500 MG tablet, Take 1 tablet (500 mg total) by mouth 2 (two) times daily as needed for muscle spasms., Disp: 20 tablet, Rfl: 0   polyethylene glycol (MIRALAX ) 17 g packet, Take 17 g by mouth daily., Disp: 14 each, Rfl: 0   Suvorexant  (BELSOMRA ) 10  MG TABS, Take 1 tablet (10 mg total) by mouth at bedtime as needed., Disp: 30 tablet, Rfl: 2  Observations/Objective: Patient is well-developed, well-nourished in no acute distress.  Resting comfortably Head is normocephalic, atraumatic.  No labored breathing.  Speech is clear and coherent with logical content.  Patient is alert and oriented at baseline.    Assessment and Plan: 1. Viral gastroenteritis (Primary) - ondansetron  (ZOFRAN -ODT) 4 MG disintegrating tablet; Take 1-2 tablets (4-8 mg total) by mouth every 8 (eight) hours as needed.  Dispense: 20 tablet; Refill: 0  - Suspect viral gastroenteritis, possible norovirus due to sudden onset and severe symptoms - Zofran  for nausea - Imodium for diarrhea - Push fluids, electrolyte beverages - Liquid diet, then increase to soft/bland (BRAT) diet over next day, then increase diet as tolerated - Strict ER precautions if worsening - Seek in person evaluation if not improving or symptoms worsen   Follow Up Instructions: I discussed the  assessment and treatment plan with the patient. The patient was provided an opportunity to ask questions and all were answered. The patient agreed with the plan and demonstrated an understanding of the instructions.  A copy of instructions were sent to the patient via MyChart unless otherwise noted below.    The patient was advised to call back or seek an in-person evaluation if the symptoms worsen or if the condition fails to improve as anticipated.    Delon [name], PA-C

## 2023-10-18 NOTE — Patient Instructions (Signed)
 Nancy Dickerson, thank you for joining Delon [name], PA-C for today's virtual visit.  While this provider is not your primary care provider (PCP), if your PCP is located in our provider database this encounter information will be shared with them immediately following your visit.   A [facility] MyChart account gives you access to today's visit and all your visits, tests, and labs performed at [facility]  click here if you don't have a [facility] MyChart account or go to [email or web address]  Consent: (Patient) Nancy Dickerson provided verbal consent for this virtual visit at the beginning of the encounter.  Current Medications:  Current Outpatient Medications:    ondansetron  (ZOFRAN -ODT) 4 MG disintegrating tablet, Take 1-2 tablets (4-8 mg total) by mouth every 8 (eight) hours as needed., Disp: 20 tablet, Rfl: 0   albuterol  (PROVENTIL ) (2.5 MG/3ML) 0.083% nebulizer solution, 2.5 mg every 6 (six) hours as needed. (Patient taking differently: Take 2.5 mg by nebulization every 6 (six) hours as needed for wheezing or shortness of breath. 2.5 mg every 6 (six) hours as needed.), Disp: 75 mL, Rfl: 1   albuterol  (VENTOLIN  HFA) 108 (90 Base) MCG/ACT inhaler, Inhale 2 puffs into the lungs every 6 (six) hours as needed for wheezing or shortness of breath., Disp: 8 g, Rfl: 2   albuterol  (VENTOLIN  HFA) 108 (90 Base) MCG/ACT inhaler, Inhale 2 puffs into the lungs every 6 (six) hours as needed for wheezing or shortness of breath., Disp: 8 g, Rfl: 0   aspirin  EC 81 MG tablet, Take 1 tablet (81 mg total) by mouth daily. Swallow whole., Disp: 30 tablet, Rfl: 12   atorvastatin  (LIPITOR) 20 MG tablet, Take 1 tablet (20 mg total) by mouth daily., Disp: 30 tablet, Rfl: 0   busPIRone  (BUSPAR ) 10 MG tablet, Take 1 tablet (10 mg total) by mouth 3 (three) times daily., Disp: 90 tablet, Rfl: 3   gabapentin  (NEURONTIN ) 300 MG capsule, Take 1 capsule (300 mg total) by mouth 3 (three) times daily.,  Disp: 90 capsule, Rfl: 3   guanFACINE  (INTUNIV ) 2 MG TB24 ER tablet, Take 1 tablet (2 mg total) by mouth daily., Disp: 30 tablet, Rfl: 3   hydrochlorothiazide  (HYDRODIURIL ) 25 MG tablet, Take 1 tablet (25 mg total) by mouth daily., Disp: 7 tablet, Rfl: 0   methocarbamol  (ROBAXIN ) 500 MG tablet, Take 1 tablet (500 mg total) by mouth 2 (two) times daily as needed for muscle spasms., Disp: 20 tablet, Rfl: 0   polyethylene glycol (MIRALAX ) 17 g packet, Take 17 g by mouth daily., Disp: 14 each, Rfl: 0   Suvorexant  (BELSOMRA ) 10 MG TABS, Take 1 tablet (10 mg total) by mouth at bedtime as needed., Disp: 30 tablet, Rfl: 2   Medications ordered in this encounter:  Meds ordered this encounter  Medications   ondansetron  (ZOFRAN -ODT) 4 MG disintegrating tablet    Sig: Take 1-2 tablets (4-8 mg total) by mouth every 8 (eight) hours as needed.    Dispense:  20 tablet    Refill:  0    Supervising Provider:   LAMPTEY, PHILIP O [[id number]]     *If you need refills on other medications prior to your next appointment, please contact your pharmacy*  Follow-Up: Call back or seek an in-person evaluation if the symptoms worsen or if the condition fails to improve as anticipated.  [facility] [phone]  Other Instructions  Norovirus Infection Norovirus infection causes inflammation in the stomach and intestines (gastroenteritis) and food poisoning. It is  caused by exposure to a virus from a group of similar viruses called noroviruses. Norovirus spreads very easily from person to person (is very contagious). It often occurs in places where people are in close contact, such as schools, nursing homes, restaurants, and cruise ships. You can get it from food, water, surfaces, or other people who have the virus. Norovirus is also found in the stool (feces) or vomit of infected people. You can spread the infection as soon as you feel sick, and you may continue to be contagious after you recover. What  are the causes? This condition is caused by contact with norovirus. You can catch norovirus if you: Eat or drink something that is contaminated with norovirus. Touch surfaces or objects that are contaminated with norovirus and then put your hand in or by your mouth or nose. Have direct contact with an infected person who may or may not still have symptoms. Share food, drink, or utensils with someone who is contagious with norovirus. What are the signs or symptoms? Symptoms usually begin within 12 hours to 2 days after you become infected. Most norovirus symptoms affect the digestive system.Symptoms may include: Nausea, vomiting, and diarrhea. Stomach cramps. Fever. Chills. Headache. Muscle aches and tiredness. How is this diagnosed? This condition may be diagnosed based on: Your symptoms. A physical exam. A stool test. How is this treated? There is no specific treatment for norovirus. Most people get better without treatment in about 2 days. Young children, the elderly, and people who are already sick may take up to 6 days to recover. Follow these instructions at home:  Eating and drinking  Drink plenty of water to replace fluids that are lost through diarrhea and vomiting. This prevents dehydration. Drink enough fluid to keep your urine pale yellow. Drink clear fluids in small amounts as you are able. Clear fluids include water, ice chips, fruit juice with water added (diluted fruit juice), and low-calorie sports drinks. Avoid fluids that contain a lot of sugar or caffeine, such as energy drinks, sports drinks, and soda. Avoid alcohol. If instructed by your health care provider, drink an oral rehydration solution (ORS). This is a drink that is sold at pharmacies and retail stores. An ORS contains minerals (electrolytes) that you can lose through diarrhea and vomiting. Eat bland, easy-to-digest foods in small amounts as you are able. These foods include rice, lean meats, toast, and  crackers. Avoid spicy or fatty foods. General instructions Rest at home while you recover. Do not prepare food for others while you are infected. Wait at least 3 days after you recover from the illness to do this. Take over-the-counter and prescription medicines only as told by your health care provider. Wash your hands frequently with soap and water for at least 20 seconds. Alcohol-based hand sanitizer can be used in addition to soap and water, but sanitizer should not be the only cleansing method because it is not effective at removing norovirus from your hands or surfaces. Make sure that each person in your household washes his or her hands well and often. Keep all follow-up visits. This is important. How is this prevented? To help prevent the spread of norovirus: Stay at home if you are feeling sick. This will reduce the risk of spreading the virus to others. Wash your hands often with soap and water for at least 20 seconds, especially after using the toilet, helping a child use the toilet, or changing a child's diaper. Wash fruits and vegetables thoroughly before peeling, preparing, or  serving them. Throw out any food that a sick person may have touched. Disinfect contaminated surfaces immediately after someone in the household has been sick. Disinfect frequently used surfaces, such as counters, doorknobs, and faucets. Use a bleach-based household cleaner. Immediately remove and wash soiled clothes or sheets. Contact a health care provider if: You have vomiting, diarrhea, or stomach pain that gets worse. You have symptoms that do not go away after 3-6 days. You have a fever. You cannot drink without vomiting. You feel light-headed or dizzy. Your symptoms get worse. Get help right away if: You develop symptoms of dehydration that do not improve with fluid replacement, such as: Excessive sleepiness. Lack of tears. Very little urine production. Dry mouth. Muscle cramps. Weak  pulse. Confusion. Summary Norovirus infection is common and often occurs in places where people are in close contact, such as schools, nursing homes, restaurants, and cruise ships. To help prevent the spread of this infection, wash hands with soap and water for at least 20 seconds before handling food or after having contact with stool or body fluids. There is no specific treatment for norovirus, but most people get better without treatment in about 2 days. People who are healthy when infected often recover sooner than those who are elderly, young, or already sick. Replace lost fluids by drinking plenty of water, or by drinking oral rehydration solution (ORS), which contains important minerals called electrolytes. This prevents dehydration. This information is not intended to replace advice given to you by your health care provider. Make sure you discuss any questions you have with your health care provider. Document Revised: 05/04/2021 Document Reviewed: 05/04/2021 Elsevier Patient Education  2024 [employer]    If you have been instructed to have an in-person evaluation today at a local Urgent Care facility, please use the link below. It will take you to a list of all of our available [facility], including address, phone number and hours of operation. Please do not delay care.  [facility]  If you or a family member do not have a primary care provider, use the link below to schedule a visit and establish care. When you choose a [facility] primary care physician or advanced practice provider, you gain a long-term partner in health. Find a Primary Care Provider  Learn more about [facility] in-office and virtual care options: [facility] - Get Care Now

## 2023-10-19 ENCOUNTER — Other Ambulatory Visit: Payer: Self-pay

## 2023-10-19 ENCOUNTER — Other Ambulatory Visit (HOSPITAL_BASED_OUTPATIENT_CLINIC_OR_DEPARTMENT_OTHER): Payer: Self-pay

## 2023-10-19 ENCOUNTER — Encounter: Payer: Self-pay | Admitting: Hematology

## 2023-10-20 ENCOUNTER — Encounter: Payer: Self-pay | Admitting: Hematology

## 2023-10-20 ENCOUNTER — Other Ambulatory Visit (HOSPITAL_BASED_OUTPATIENT_CLINIC_OR_DEPARTMENT_OTHER): Payer: Self-pay

## 2023-10-30 ENCOUNTER — Other Ambulatory Visit (HOSPITAL_BASED_OUTPATIENT_CLINIC_OR_DEPARTMENT_OTHER): Payer: Self-pay

## 2023-12-12 ENCOUNTER — Encounter: Payer: Self-pay | Admitting: Hematology

## 2023-12-12 ENCOUNTER — Telehealth (INDEPENDENT_AMBULATORY_CARE_PROVIDER_SITE_OTHER): Payer: Medicaid Other | Admitting: Psychiatry

## 2023-12-12 ENCOUNTER — Encounter (HOSPITAL_COMMUNITY): Payer: Self-pay | Admitting: Psychiatry

## 2023-12-12 ENCOUNTER — Other Ambulatory Visit (HOSPITAL_COMMUNITY): Payer: Self-pay

## 2023-12-12 DIAGNOSIS — F32A Depression, unspecified: Secondary | ICD-10-CM | POA: Diagnosis not present

## 2023-12-12 DIAGNOSIS — G4701 Insomnia due to medical condition: Secondary | ICD-10-CM | POA: Diagnosis not present

## 2023-12-12 DIAGNOSIS — F411 Generalized anxiety disorder: Secondary | ICD-10-CM | POA: Diagnosis not present

## 2023-12-12 DIAGNOSIS — F902 Attention-deficit hyperactivity disorder, combined type: Secondary | ICD-10-CM

## 2023-12-12 MED ORDER — GABAPENTIN 300 MG PO CAPS
300.0000 mg | ORAL_CAPSULE | Freq: Three times a day (TID) | ORAL | 3 refills | Status: DC
Start: 2023-12-12 — End: 2024-02-27
  Filled 2023-12-12 – 2024-02-27 (×2): qty 90, 30d supply, fill #0

## 2023-12-12 MED ORDER — VILOXAZINE HCL ER 200 MG PO CP24
200.0000 mg | ORAL_CAPSULE | Freq: Every day | ORAL | 3 refills | Status: DC
Start: 2023-12-12 — End: 2024-02-27
  Filled 2023-12-12: qty 60, 30d supply, fill #0

## 2023-12-12 MED ORDER — BELSOMRA 10 MG PO TABS
10.0000 mg | ORAL_TABLET | Freq: Every evening | ORAL | 2 refills | Status: DC | PRN
Start: 2023-12-12 — End: 2024-02-27
  Filled 2023-12-12: qty 30, 30d supply, fill #0

## 2023-12-12 MED ORDER — ARIPIPRAZOLE 5 MG PO TABS
5.0000 mg | ORAL_TABLET | Freq: Every day | ORAL | 3 refills | Status: DC
Start: 2023-12-12 — End: 2024-02-27
  Filled 2023-12-12 – 2024-02-27 (×2): qty 30, 30d supply, fill #0

## 2023-12-12 NOTE — Progress Notes (Signed)
 [facility] MD/PA/NP OP Progress Note Virtual Visit via Video Note  I connected with Nancy Dickerson on 08/17/2021 at  2:30 PM EST by a video enabled telemedicine application and verified that I am speaking with the correct person using two identifiers.  Location: Patient:Work Provider: Clinic   I discussed the limitations of evaluation and management by telemedicine and the availability of in person appointments. The patient expressed understanding and agreed to proceed.  I provided 30 minutes of non-face-to-face time during this encounter.    12/12/2023 3:27 PM Nancy Dickerson  MRN:  [id number]  Chief Complaint:  "Today has been a long day"  HPI: 43 year old female seen today for follow up psychiatric evaluation.  She has psychiatric history of anxiety, depression, and insomnia. She is currently managed on gabapentin 300 mg 3 times daily,Buspar 10 mg three times daily, Wellbutrin XL 300 mg, Belsomra 10 mg nightly. She notes that she never received Belsomra and reports that she discontinued all of her other medications.    Today, patient was well groomed, pleasant, cooperative, engaged in conversation and maintained eye contact. She informed provider that she has been having a long day. She notes that she got in a conflict with one of her supervisors.   Patient reports that she continues to have issues with ADHD and her concentration.  She is trialed Wellbutrin, Intuniv, and Strattera without success.  She notes that at times she is forgetful, disorganized, has poor listening skills, and inattentive with complex task.  Patient notes that she is interested in Adderall.  Provider informed patient that Adderall may be trialed pending a negative UDS.  She endorsed understanding and agreed.  Provider asked patient if she would be interested in trialing Qelbree.  She notes that she would.  Provider informed patient that she could pick a sample up from the clinic.  Since her last visit she inform her that her  anxiety and depression has somewhat improved.  She reports that she has been more happy as recently she got married less than a month ago. She also notes that she finally got to meet her 10 year old grandson. Today provider conducted a GAD-7 and patient scored a 6, at her last visit she scored a 9.  Provider also conducted PHQ-9 and patient reported 6, at her last visit she scored a 8.  She notes that she continues to have poor sleep noting that she sleeps 5 hours nightly.  She endorses adequate appetite.  Today she denies SI/HI/VAH, mania, paranoia.  She does note that at times she is irritable, distracted, have racing thoughts.  Patient informed writer that she continues to have aches and pains.  She is also concerned about her child's sleeping.  Patient was referred to primary care at her last visit.  She notes that she did not follow-up.  Provider encouraged patient to follow-up with primary care.  Patient requested Belsomra to help manage sleep.  Provider sent it to her preferred pharmacy.  At this time she does not wish to restart BuSpar, Wellbutrin, or Intuniv.  She will continue gabapentin as prescribed.  Provided ordered Qelbree 200 mg to be taken for a week and then increase to 400 mg.  Patient notes that she is unsure if her insurance will cover it.  Provider informed patient that she can have a sample.  She endorsed understanding and agreed.  Adderall will be trailed pending negative UDS. Potential side effects of medication and risks vs benefits of treatment vs non-treatment were explained  and discussed. All questions were answered.She will continue her other medications as prescribed.     Visit Diagnosis:    ICD-10-CM   1. Attention deficit hyperactivity disorder (ADHD), combined type  F90.2 viloxazine ER (QELBREE) 200 MG 24 hr capsule    2. Generalized anxiety disorder  F41.1 gabapentin (NEURONTIN) 300 MG capsule    3. 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  G47.01 Suvorexant (BELSOMRA) 10 MG TABS          Past Psychiatric History: Anxiety, Depression, and insomnia    Past Medical History:  Past Medical History:  Diagnosis Date   Anemia    Asthma    Bursitis of hip    Cardiomegaly    Chondromalacia of hip    Chondromalacia of right knee    COPD (chronic obstructive pulmonary disease) (HCC)    GERD (gastroesophageal reflux disease)    H/O transfusion of packed red blood cells    Tinnitus     Past Surgical History:  Procedure Laterality Date   CERVICAL CONIZATION W/BX N/A 06/25/2019   Procedure: CONIZATION CERVIX WITH BIOPSY - COLD KNIFE;  Surgeon: Allie Bossier, MD;  Location: [facility];  Service: Gynecology;  Laterality: N/A;   ENDOMETRIAL ABLATION N/A 06/25/2019   Procedure: MINERVA ABLATION;  Surgeon: Allie Bossier, MD;  Location: [facility];  Service: Gynecology;  Laterality: N/A;  Minerva rep will here confirmed on 06/20/19 CS   HYSTEROSCOPY WITH D & C  06/25/2019   Procedure: DILATATION AND CURETTAGE /HYSTEROSCOPY;  Surgeon: Allie Bossier, MD;  Location: [facility];  Service: Gynecology;;   NO PAST SURGERIES      Family Psychiatric History: Daughter SA, Mother Bipolar disorder, and sister Bipolar disorder  Family History:  Family History  Problem Relation Age of Onset   Sudden death Father 25   Asthma Sister    Asthma Son     Social History:  Social History   Socioeconomic History   Marital status: Single    Spouse name: Not on file   Number of children: 6   Years of education: 12   Highest education level: Not on file  Occupational History   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  Tobacco Use    Smoking status: Never   Smokeless tobacco: Never  Vaping Use   Vaping status: Never Used  Substance and Sexual Activity   Alcohol use: Yes    Comment: rare   Drug use: Yes    Types: Marijuana    Comment: Occasionally   Sexual activity: Yes  Other Topics Concern   Not on file  Social History Narrative   ** Merged History Encounter **         Lives with someone   Right handed    Little caffeine - drinks Sprite   Education - some college          Social Drivers of Health   Financial Resource Strain: Not on file  Food Insecurity: No Food Insecurity (08/09/2023)   Hunger Vital  Sign    Worried About Programme researcher, broadcasting/film/video in the Last Year: Never true    Ran Out of Food in the Last Year: Never true  Transportation Needs: No Transportation Needs (10/06/2021)   PRAPARE - Administrator, Civil Service (Medical): No    Lack of Transportation (Non-Medical): No  Physical Activity: Not on file  Stress: Not on file  Social Connections: Unknown (02/06/2022)   [facility]   Social Network    Social Network: Not on file    Allergies: No Known Allergies  Metabolic Disorder Labs: Lab Results  Component Value Date   HGBA1C 5.6 01/08/2023   MPG 114 01/08/2023   MPG 99.67 01/30/2018   No results found for: "PROLACTIN" Lab Results  Component Value Date   CHOL 157 09/08/2021   TRIG 83 09/08/2021   HDL 50 09/08/2021   CHOLHDL 3.1 09/08/2021   VLDL 18 01/30/2018   LDLCALC 91 09/08/2021   LDLCALC 58 01/30/2018   Lab Results  Component Value Date   TSH 1.353 01/08/2023   TSH 0.665 09/08/2021    Therapeutic Level Labs: No results found for: "LITHIUM" No results found for: "VALPROATE" No results found for: "CBMZ"  Current Medications: Current Outpatient Medications  Medication Sig Dispense Refill   ARIPiprazole (ABILIFY) 5 MG tablet Take 1 tablet (5 mg total) by mouth daily. 30 tablet 3   albuterol (PROVENTIL) (2.5 MG/3ML) 0.083%  nebulizer solution 2.5 mg every 6 (six) hours as needed. (Patient taking differently: Take 2.5 mg by nebulization every 6 (six) hours as needed for wheezing or shortness of breath. 2.5 mg every 6 (six) hours as needed.) 75 mL 1   albuterol (VENTOLIN HFA) 108 (90 Base) MCG/ACT inhaler Inhale 2 puffs into the lungs every 6 (six) hours as needed for wheezing or shortness of breath. 8 g 2   albuterol (VENTOLIN HFA) 108 (90 Base) MCG/ACT inhaler Inhale 2 puffs into the lungs every 6 (six) hours as needed for wheezing or shortness of breath. 8 g 0   aspirin EC 81 MG tablet Take 1 tablet (81 mg total) by mouth daily. Swallow whole. 30 tablet 12   atorvastatin (LIPITOR) 20 MG tablet Take 1 tablet (20 mg total) by mouth daily. 30 tablet 0   gabapentin (NEURONTIN) 300 MG capsule Take 1 capsule (300 mg total) by mouth 3 (three) times daily. 90 capsule 3   hydrochlorothiazide (HYDRODIURIL) 25 MG tablet Take 1 tablet (25 mg total) by mouth daily. 7 tablet 0   methocarbamol (ROBAXIN) 500 MG tablet Take 1 tablet (500 mg total) by mouth 2 (two) times daily as needed for muscle spasms. 20 tablet 0   ondansetron (ZOFRAN-ODT) 4 MG disintegrating tablet Take 1-2 tablets (4-8 mg total) by mouth every 8 (eight) hours as needed. 20 tablet 0   polyethylene glycol (MIRALAX) 17 g packet Take 17 g by mouth daily. 14 each 0   Suvorexant (BELSOMRA) 10 MG TABS Take 1 tablet (10 mg total) by mouth at bedtime as needed. 30 tablet 2   viloxazine ER (QELBREE) 200 MG 24 hr capsule Take 1-2 capsules (200-400 mg total) by mouth daily. Take 200 mg for a week and then increase to 400 mg 60 capsule 3   No current facility-administered medications for this visit.     Musculoskeletal: Strength & Muscle Tone: within normal limits and  telehealth visit Gait & Station: normal, telehealth visit Patient leans: N/A  Psychiatric Specialty Exam: Review of Systems  There were no vitals taken for this visit.There is no height or weight on file  to calculate BMI.  General Appearance: Well Groomed  Eye Contact:  Good  Speech:  Clear and Coherent and Normal Rate  Volume:  Normal  Mood:  Euthymic and  improvement  Affect:  Appropriate and Congruent  Thought Process:  Coherent, Goal Directed and Linear  Orientation:  Full (Time, Place, and Person)  Thought Content: WDL and Logical   Suicidal Thoughts:  No  Homicidal Thoughts:  No  Memory:  Immediate;   Good Recent;   Good Remote;   Good  Judgement:  Good  Insight:  Good  Psychomotor Activity:  Normal  Concentration:  Concentration: Fair and Attention Span: Fair  Recall:  Good  Fund of Knowledge: Good  Language: Good  Akathisia:  No  Handed:  Right  AIMS (if indicated): Not done  Assets:  Communication Skills Desire for Improvement Financial Resources/Insurance Housing Intimacy Physical Health Social Support  ADL's:  Intact  Cognition: WNL  Sleep:  Fair   Screenings: GAD-7    Flowsheet Row Video Visit from 12/12/2023 in [facility] Video Visit from 10/09/2023 in [facility] Video Visit from 07/18/2023 in [facility] Video Visit from 06/20/2023 in [facility] Video Visit from 03/28/2023 in [facility]  Total GAD-7 Score 6 9 9 15 8       PHQ2-9    Flowsheet Row Video Visit from 12/12/2023 in [facility] Video Visit from 10/09/2023 in [facility] Video Visit from 07/18/2023 in [facility] Video Visit from 06/20/2023 in [facility] Video Visit from 03/28/2023 in [facility]  PHQ-2 Total Score 1 1 1 3 2   PHQ-9 Total Score 6 8 10 12 5       Flowsheet Row ED from 05/15/2023 in [facility]) Video Visit from 03/28/2023 in [facility] ED from 03/11/2023 in [facility])  C-SSRS RISK CATEGORY No Risk No Risk No Risk        Assessment and Plan: Patient notes that her anxiety and depression has somewhat improved but her sleep  and concentrating has not. Patient requested Belsomra to help manage sleep.  Provider sent it to her preferred pharmacy.  At this time she does not wish to restart BuSpar, Wellbutrin, or Intuniv.  She will continue gabapentin as prescribed.  Provided ordered Qelbree 200 mg to be taken for a week and then increase to 400 mg.  Patient notes that she is unsure if her insurance will cover it.  Provider informed patient that she can have a sample.  She endorsed understanding and agreed.  Adderall will be trailed pending negative UDS.   1. Generalized anxiety disorder  Continue- gabapentin (NEURONTIN) 300 MG capsule; Take 1 capsule (300 mg total) by mouth 3 (three) times daily.  Dispense: 90 capsule; Refill: 3  2. Attention deficit hyperactivity disorder (ADHD), combined type (Primary)  Start- viloxazine ER (QELBREE) 200 MG 24 hr capsule; Take 1-2 capsules (200-400 mg total) by mouth daily. Take 200 mg for a week and then increase to 400 mg  Dispense: 60 capsule; Refill: 3   3. Mild depression  - Urine Drug Panel 7; Future - CBC w/Diff/Platelet - Comprehensive Metabolic Panel (CMET); Future - Lipid panel; Future -  HgB A1c; Future - Thyroid Panel With TSH; Future Start- ARIPiprazole (ABILIFY) 5 MG tablet; Take 1 tablet (5 mg total) by mouth daily.  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  Dispense: 30 tablet; Refill: 2        Follow up in 2.5 months  Shanna Cisco, NP 12/12/2023, 3:27 PM

## 2023-12-13 ENCOUNTER — Other Ambulatory Visit (HOSPITAL_COMMUNITY): Payer: Self-pay

## 2023-12-17 ENCOUNTER — Encounter: Payer: Self-pay | Admitting: Hematology

## 2023-12-24 ENCOUNTER — Other Ambulatory Visit (HOSPITAL_COMMUNITY): Payer: Self-pay

## 2024-02-27 ENCOUNTER — Telehealth (INDEPENDENT_AMBULATORY_CARE_PROVIDER_SITE_OTHER): Payer: Self-pay | Admitting: Psychiatry

## 2024-02-27 ENCOUNTER — Other Ambulatory Visit: Payer: Self-pay

## 2024-02-27 ENCOUNTER — Other Ambulatory Visit (HOSPITAL_COMMUNITY): Payer: Self-pay

## 2024-02-27 ENCOUNTER — Encounter: Payer: Self-pay | Admitting: Hematology

## 2024-02-27 ENCOUNTER — Encounter (HOSPITAL_COMMUNITY): Payer: Self-pay | Admitting: Psychiatry

## 2024-02-27 DIAGNOSIS — F32A Depression, unspecified: Secondary | ICD-10-CM | POA: Diagnosis not present

## 2024-02-27 DIAGNOSIS — F411 Generalized anxiety disorder: Secondary | ICD-10-CM

## 2024-02-27 DIAGNOSIS — G4701 Insomnia due to medical condition: Secondary | ICD-10-CM

## 2024-02-27 MED ORDER — ARIPIPRAZOLE 5 MG PO TABS
5.0000 mg | ORAL_TABLET | Freq: Every day | ORAL | 3 refills | Status: AC
Start: 2024-02-27 — End: ?
  Filled 2024-02-27 (×2): qty 30, 30d supply, fill #0

## 2024-02-27 MED ORDER — GABAPENTIN 300 MG PO CAPS
300.0000 mg | ORAL_CAPSULE | Freq: Three times a day (TID) | ORAL | 3 refills | Status: AC
Start: 2024-02-27 — End: ?
  Filled 2024-02-27 (×2): qty 90, 30d supply, fill #0

## 2024-02-27 MED ORDER — TRAZODONE HCL 50 MG PO TABS
50.0000 mg | ORAL_TABLET | Freq: Every evening | ORAL | 3 refills | Status: AC | PRN
Start: 2024-02-27 — End: ?
  Filled 2024-02-27 (×2): qty 60, 30d supply, fill #0

## 2024-02-27 MED ORDER — BUPROPION HCL ER (XL) 150 MG PO TB24
150.0000 mg | ORAL_TABLET | ORAL | 3 refills | Status: AC
Start: 2024-02-27 — End: ?
  Filled 2024-02-27 (×2): qty 30, 30d supply, fill #0

## 2024-02-27 NOTE — Progress Notes (Signed)
 [facility] MD/PA/NP OP Progress Note Virtual Visit via Video Note  I connected with Nancy Dickerson on 08/17/2021 at  1:30 PM EDT by a video enabled telemedicine application and verified that I am speaking with the correct person using two identifiers.  Location: Patient:Work Provider: Clinic   I discussed the limitations of evaluation and management by telemedicine and the availability of in person appointments. The patient expressed understanding and agreed to proceed.  I provided 30 minutes of non-face-to-face time during this encounter.    02/27/2024 2:45 PM Nancy Dickerson  MRN:  [id number]  Chief Complaint:  "I have not had any of my medications"  HPI: 43 year old female seen today for follow up psychiatric evaluation.  She has psychiatric history of anxiety, depression, and insomnia. She is currently managed on gabapentin  300 mg 3 times daily, Abilify  5 mg daily, Buspar  10 mg three times daily, Wellbutrin  XL 300 mg, Belsomra  10 mg nightly. She notes that has not had her medications in 2 months.    Today, patient was well groomed, pleasant, cooperative, engaged in conversation and maintained eye contact. She informed provider that she has been out out of her medications for the last two months. She notes that she has lacked sleep, is anxious, and more depressed. Patient notes that her pharmacy wanted to charge her 1,000 $ for her medications. Provider reached out to [facility] and was informed that Belsomra  and Junie Olds was that expensive  but notes that her other medications were around $4 each she informed writer that she would pick up her other medications today.     Today provider conducted a GAD-7 and patient scored a 18, at her last visit she scored a 6.  Provider also conducted PHQ-9 and patient reported 18, at her last visit she scored a 6.  She notes that she continues to have poor sleep noting that she sleeps 4-5 hours nightly.  She endorses fluctuating appetite.  Today  she denies SI/HI/VAH, mania, paranoia.  She does note that at times she is irritable, distracted, have racing thoughts.  Patient reports that she continues to have issues with ADHD and her concentration.  She is trialed Wellbutrin , Intuniv , and Strattera  without success.  She notes that at times she is forgetful, disorganized, has poor listening skills, and inattentive with complex task.  Patient notes that she is interested in Adderall.  Provider informed patient that Adderall may be trialed pending a negative UDS.  She endorsed understanding and agreed.  Patient never picked up her sample of Qelbree  and is unable to afford how much it cost in the pharmacy.    In the past patient has tried Ambien  and trazodone  which she reports caused increased irritability.  Provider asked patient if she would be willing to try amitriptyline , doxepin, or mirtazapine.  She notes that she did not wish to try these medication as she does not want to gain more weight.  Provider offered patient Seroquel also however she was not interested.  She notes that she would like to retry trazodone .  Today provider ordered trazodone  50-100 mg to be taken nightly as needed.  Patient agreeable to restarting Wellbutrin  XL 150 mg to help manage inattentiveness and depression.  She will also restart gabapentin  300 mg 3 times daily to help manage mood and Abilify  5 mg daily to help manage mood.  Medication sent to community health and wellness.  At this time BuSpar , Belsomra , and Qulbree not restarted.  No other concerns at this time.  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 Mild depression  F32.A ARIPiprazole  (ABILIFY ) 5 MG tablet    3. Generalized anxiety disorder  F41.1 gabapentin  (NEURONTIN ) 300 MG capsule    buPROPion  (WELLBUTRIN  XL) 150 MG 24 hr tablet           Past Psychiatric History: Anxiety, Depression, and insomnia    Past Medical  History:  Past Medical History:  Diagnosis Date   Anemia    Asthma    Bursitis of hip    Cardiomegaly    Chondromalacia of hip    Chondromalacia of right knee    COPD (chronic obstructive pulmonary disease) (HCC)    GERD (gastroesophageal reflux disease)    H/O transfusion of packed red blood cells    Tinnitus     Past Surgical History:  Procedure Laterality Date   CERVICAL CONIZATION W/BX N/A 06/25/2019   Procedure: CONIZATION CERVIX WITH BIOPSY - COLD KNIFE;  Surgeon: Ana Balling, MD;  Location: [facility];  Service: Gynecology;  Laterality: N/A;   ENDOMETRIAL ABLATION N/A 06/25/2019   Procedure: MINERVA ABLATION;  Surgeon: Ana Balling, MD;  Location: [facility];  Service: Gynecology;  Laterality: N/A;  Minerva rep will here confirmed on 06/20/19 CS   HYSTEROSCOPY WITH D & C  06/25/2019   Procedure: DILATATION AND CURETTAGE /HYSTEROSCOPY;  Surgeon: Ana Balling, MD;  Location: [facility];  Service: Gynecology;;   NO PAST SURGERIES      Family Psychiatric History: Daughter SA, Mother Bipolar disorder, and sister Bipolar disorder  Family History:  Family History  Problem Relation Age of Onset   Sudden death Father 53   Asthma Sister    Asthma Son     Social History:  Social History   Socioeconomic History   Marital status: Single    Spouse name: Not on file   Number of children: 6   Years of education: 12   Highest education level: Not on file  Occupational History   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  Tobacco Use   Smoking status: Never   Smokeless tobacco: Never  Vaping Use   Vaping status: Never Used  Substance and Sexual Activity   Alcohol use: Yes    Comment: rare   Drug use: Yes    Types: Marijuana    Comment: Occasionally   Sexual activity: Yes  Other Topics Concern   Not on file  Social History Narrative   ** Merged History Encounter **         Lives with someone   Right handed    Little caffeine - drinks Sprite    Education - some college          Social Drivers of Health   Financial Resource Strain: Not on file  Food Insecurity: No Food Insecurity (08/09/2023)   Hunger Vital Sign    Worried About Running Out of Food in the Last Year: Never true    Ran Out of Food in the Last Year: Never true  Transportation Needs: No Transportation Needs (10/06/2021)   PRAPARE - Administrator, Civil Service (Medical): No    Lack of Transportation (Non-Medical): No  Physical Activity: Not on file  Stress: Not on file  Social Connections: Unknown (02/06/2022)   [facility]   Social Network    Social Network: Not on file  Allergies: No Known Allergies  Metabolic Disorder Labs: Lab Results  Component Value Date   HGBA1C 5.6 01/08/2023   MPG 114 01/08/2023   MPG 99.67 01/30/2018   No results found for: "PROLACTIN" Lab Results  Component Value Date   CHOL 157 09/08/2021   TRIG 83 09/08/2021   HDL 50 09/08/2021   CHOLHDL 3.1 09/08/2021   VLDL 18 01/30/2018   LDLCALC 91 09/08/2021   LDLCALC 58 01/30/2018   Lab Results  Component Value Date   TSH 1.353 01/08/2023   TSH 0.665 09/08/2021    Therapeutic Level Labs: No results found for: "LITHIUM" No results found for: "VALPROATE" No results found for: "CBMZ"  Current Medications: Current Outpatient Medications  Medication Sig Dispense Refill   buPROPion  (WELLBUTRIN  XL) 150 MG 24 hr tablet Take 1 tablet (150 mg total) by mouth every morning. 30 tablet 3   traZODone  (DESYREL ) 50 MG tablet Take 1-2 tablets (50-100 mg total) by mouth at bedtime as needed for sleep. 60 tablet 3   albuterol  (PROVENTIL ) (2.5 MG/3ML) 0.083% nebulizer solution 2.5 mg every 6 (six) hours as needed. (Patient taking differently: Take 2.5 mg by nebulization every 6 (six) hours as needed for wheezing or shortness of breath. 2.5 mg every 6 (six) hours as needed.) 75 mL 1   albuterol  (VENTOLIN  HFA) 108 (90 Base) MCG/ACT inhaler Inhale  2 puffs into the lungs every 6 (six) hours as needed for wheezing or shortness of breath. 8 g 2   albuterol  (VENTOLIN  HFA) 108 (90 Base) MCG/ACT inhaler Inhale 2 puffs into the lungs every 6 (six) hours as needed for wheezing or shortness of breath. 8 g 0   ARIPiprazole  (ABILIFY ) 5 MG tablet Take 1 tablet (5 mg total) by mouth daily. 30 tablet 3   aspirin  EC 81 MG tablet Take 1 tablet (81 mg total) by mouth daily. Swallow whole. 30 tablet 12   atorvastatin  (LIPITOR) 20 MG tablet Take 1 tablet (20 mg total) by mouth daily. 30 tablet 0   gabapentin  (NEURONTIN ) 300 MG capsule Take 1 capsule (300 mg total) by mouth 3 (three) times daily. 90 capsule 3   hydrochlorothiazide  (HYDRODIURIL ) 25 MG tablet Take 1 tablet (25 mg total) by mouth daily. 7 tablet 0   methocarbamol  (ROBAXIN ) 500 MG tablet Take 1 tablet (500 mg total) by mouth 2 (two) times daily as needed for muscle spasms. 20 tablet 0   ondansetron  (ZOFRAN -ODT) 4 MG disintegrating tablet Take 1-2 tablets (4-8 mg total) by mouth every 8 (eight) hours as needed. 20 tablet 0   polyethylene glycol (MIRALAX ) 17 g packet Take 17 g by mouth daily. 14 each 0   No current facility-administered medications for this visit.     Musculoskeletal: Strength & Muscle Tone: within normal limits and  telehealth visit Gait & Station: normal, telehealth visit Patient leans: N/A  Psychiatric Specialty Exam: Review of Systems  There were no vitals taken for this visit.There is no height or weight on file to calculate BMI.  General Appearance: Well Groomed  Eye Contact:  Good  Speech:  Clear and Coherent and Normal Rate  Volume:  Normal  Mood:  Anxious and Depressed  Affect:  Appropriate and Congruent  Thought Process:  Coherent, Goal Directed and Linear  Orientation:  Full (Time, Place, and Person)  Thought Content: WDL and Logical   Suicidal Thoughts:  No  Homicidal Thoughts:  No  Memory:  Immediate;   Good Recent;   Good Remote;   Good  Judgement:  Good  Insight:  Good  Psychomotor Activity:  Normal  Concentration:  Concentration: Fair and Attention Span: Fair  Recall:  Good  Fund of Knowledge: Good  Language: Good  Akathisia:  No  Handed:  Right  AIMS (if indicated): Not done  Assets:  Communication Skills Desire for Improvement Financial Resources/Insurance Housing Intimacy Physical Health Social Support  ADL's:  Intact  Cognition: WNL  Sleep:  Fair   Screenings: GAD-7    Flowsheet Row Video Visit from 02/27/2024 in [facility] Video Visit from 12/12/2023 in [facility] Video Visit from 10/09/2023 in [facility] Video Visit from 07/18/2023 in [facility] Video Visit from 06/20/2023 in [facility]  Total GAD-7 Score 18 6 9 9 15       PHQ2-9    Flowsheet Row Video Visit from 02/27/2024 in [facility] Video Visit from 12/12/2023 in [facility] Video Visit from 10/09/2023 in [facility] Video Visit from 07/18/2023 in [facility] Video Visit from 06/20/2023 in [facility]  PHQ-2 Total Score 4 1 1 1 3   PHQ-9 Total Score 18 6 8 10 12       Flowsheet Row UC from 05/15/2023 in [facility] Video Visit from 03/28/2023 in [facility] UC from 03/11/2023 in [facility])  C-SSRS RISK CATEGORY No Risk No Risk No Risk        Assessment and Plan: Patient notes that her sleep, anxiety, and depression has worsened since her last visit.   In the past patient has tried Ambien  and trazodone  which she reports caused increased irritability.  Provider asked patient if she would be willing to try amitriptyline , doxepin, or mirtazapine.  She notes that she did  not wish to try these medication as she does not want to gain more weight.  Provider offered patient Seroquel also however she was not interested.  She notes that she would like to retry trazodone .  Today provider ordered trazodone  50-100 mg to be taken nightly as needed.  Patient agreeable to restarting Wellbutrin  XL 150 mg to help manage inattentiveness and depression.  She will also restart gabapentin  300 mg 3 times daily to help manage mood and Abilify  5 mg daily to help manage mood.  Medication sent to community health and wellness.  At this time BuSpar , Belsomra , and Qulbree not restarted.   1. Mild depression  Restart- ARIPiprazole  (ABILIFY ) 5 MG tablet; Take 1 tablet (5 mg total) by mouth daily.  Dispense: 30 tablet; Refill: 3  2. Generalized anxiety disorder  Restart- gabapentin  (NEURONTIN ) 300 MG capsule; Take 1 capsule (300 mg total) by mouth 3 (three) times daily.  Dispense: 90 capsule; Refill: 3 Restart- buPROPion  (WELLBUTRIN  XL) 150 MG 24 hr tablet; Take 1 tablet (150 mg total) by mouth every morning.  Dispense: 30 tablet; Refill: 3  3. Insomnia due to medical condition (Primary) Restart- traZODone  (DESYREL ) 50 MG tablet; Take 1-2 tablets (50-100 mg total) by mouth at bedtime as needed for sleep.  Dispense: 60 tablet; Refill: 3       Follow up in 2.5 months  Arlyne Bering, NP 02/27/2024, 2:45 PM

## 2024-03-26 ENCOUNTER — Telehealth (HOSPITAL_COMMUNITY): Payer: Self-pay

## 2024-03-26 NOTE — Telephone Encounter (Signed)
 Called PT this morning to reschedule PT due to being on Dr Yuvonne Herald schedule - PT was not happy and became argumentative - PT states  This is ridiculous why is my appointment getting rescheduled - I will just wait for Dr Melisa Spray to come back I explained that Dr Melisa Spray will be out with her baby. PT still was argumentative and started getting loud, I ended the phone call.

## 2024-05-05 ENCOUNTER — Telehealth (HOSPITAL_COMMUNITY): Admitting: Psychiatry

## 2024-05-08 ENCOUNTER — Telehealth (HOSPITAL_COMMUNITY): Admitting: Psychiatry

## 2024-05-27 ENCOUNTER — Other Ambulatory Visit: Payer: Self-pay

## 2024-05-27 ENCOUNTER — Emergency Department (HOSPITAL_BASED_OUTPATIENT_CLINIC_OR_DEPARTMENT_OTHER): Payer: Self-pay | Admitting: Radiology

## 2024-05-27 ENCOUNTER — Encounter (HOSPITAL_BASED_OUTPATIENT_CLINIC_OR_DEPARTMENT_OTHER): Payer: Self-pay

## 2024-05-27 ENCOUNTER — Emergency Department (HOSPITAL_BASED_OUTPATIENT_CLINIC_OR_DEPARTMENT_OTHER)
Admission: EM | Admit: 2024-05-27 | Discharge: 2024-05-27 | Disposition: A | Discharge status: Home / Self Care | Payer: Self-pay | Attending: Emergency Medicine | Admitting: Emergency Medicine

## 2024-05-27 DIAGNOSIS — M7918 Myalgia, other site: Secondary | ICD-10-CM | POA: Insufficient documentation

## 2024-05-27 DIAGNOSIS — Z7982 Long term (current) use of aspirin: Secondary | ICD-10-CM | POA: Insufficient documentation

## 2024-05-27 DIAGNOSIS — D649 Anemia, unspecified: Secondary | ICD-10-CM

## 2024-05-27 DIAGNOSIS — R0602 Shortness of breath: Secondary | ICD-10-CM | POA: Insufficient documentation

## 2024-05-27 DIAGNOSIS — J111 Influenza due to unidentified influenza virus with other respiratory manifestations: Secondary | ICD-10-CM

## 2024-05-27 DIAGNOSIS — J449 Chronic obstructive pulmonary disease, unspecified: Secondary | ICD-10-CM | POA: Insufficient documentation

## 2024-05-27 DIAGNOSIS — Z7951 Long term (current) use of inhaled steroids: Secondary | ICD-10-CM | POA: Insufficient documentation

## 2024-05-27 DIAGNOSIS — R509 Fever, unspecified: Secondary | ICD-10-CM | POA: Insufficient documentation

## 2024-05-27 DIAGNOSIS — R0989 Other specified symptoms and signs involving the circulatory and respiratory systems: Secondary | ICD-10-CM | POA: Insufficient documentation

## 2024-05-27 DIAGNOSIS — J45909 Unspecified asthma, uncomplicated: Secondary | ICD-10-CM | POA: Insufficient documentation

## 2024-05-27 LAB — CBC WITH DIFFERENTIAL/PLATELET
Abs Immature Granulocytes: 0.01 K/uL (ref 0.00–0.07)
Basophils Absolute: 0 K/uL (ref 0.0–0.1)
Basophils Relative: 1 %
Eosinophils Absolute: 0.2 K/uL (ref 0.0–0.5)
Eosinophils Relative: 2 %
HCT: 26.5 % — ABNORMAL LOW (ref 36.0–46.0)
Hemoglobin: 7.5 g/dL — ABNORMAL LOW (ref 12.0–15.0)
Immature Granulocytes: 0 %
Lymphocytes Relative: 34 %
Lymphs Abs: 2.6 K/uL (ref 0.7–4.0)
MCH: 19.6 pg — ABNORMAL LOW (ref 26.0–34.0)
MCHC: 28.3 g/dL — ABNORMAL LOW (ref 30.0–36.0)
MCV: 69.2 fL — ABNORMAL LOW (ref 80.0–100.0)
Monocytes Absolute: 0.7 K/uL (ref 0.1–1.0)
Monocytes Relative: 9 %
Neutro Abs: 4.2 K/uL (ref 1.7–7.7)
Neutrophils Relative %: 54 %
Platelets: 398 K/uL (ref 150–400)
RBC: 3.83 MIL/uL — ABNORMAL LOW (ref 3.87–5.11)
RDW: 20.5 % — ABNORMAL HIGH (ref 11.5–15.5)
WBC: 7.6 K/uL (ref 4.0–10.5)
nRBC: 0 % (ref 0.0–0.2)

## 2024-05-27 LAB — RESP PANEL BY RT-PCR (RSV, FLU A&B, COVID)  RVPGX2
Influenza A by PCR: NEGATIVE
Influenza B by PCR: NEGATIVE
Resp Syncytial Virus by PCR: NEGATIVE
SARS Coronavirus 2 by RT PCR: NEGATIVE

## 2024-05-27 LAB — BASIC METABOLIC PANEL WITH GFR
Anion gap: 11 (ref 5–15)
BUN: 10 mg/dL (ref 6–20)
CO2: 24 mmol/L (ref 22–32)
Calcium: 8.7 mg/dL — ABNORMAL LOW (ref 8.9–10.3)
Chloride: 103 mmol/L (ref 98–111)
Creatinine, Ser: 0.71 mg/dL (ref 0.44–1.00)
GFR, Estimated: >60 mL/min (ref >60)
Glucose, Bld: 96 mg/dL (ref 70–99)
Potassium: 3.7 mmol/L (ref 3.5–5.1)
Sodium: 138 mmol/L (ref 135–145)

## 2024-05-27 NOTE — ED Triage Notes (Signed)
 Patient reports feeling feverish since yesterday. She also feels some shortness of breath and generalized body aches.

## 2024-05-27 NOTE — Discharge Instructions (Signed)
 Follow-up with your primary care doctor to follow the anemia closely.  Recommend extra strength Tylenol  2 tablets every 8 hours for the flulike symptoms.  If you start to get more congestion and cough could do over-the-counter cold and flu medicine as well.  Just keep in mind that usually has Tylenol  in it.  Return for any new or worse symptoms.

## 2024-05-27 NOTE — ED Provider Notes (Addendum)
 [place] EMERGENCY DEPARTMENT AT [facility] Provider Note   CSN: [id number] Arrival date & time: 05/27/24  1701     Patient presents with: Fever, Generalized Body Aches, and Nausea   Nancy Dickerson is a 43 y.o. female.   Patient yesterday started with influenza-like illness with bodyaches fever feeling short of breath.  No cough.  But does have a little bit of a runny nose.  No significant sore throat.  No exposure to anyone has been sick that she knows of.  Past medical history significant for anemia asthma COPD.  Temp here 98.4 pulse 85 respiration 14 blood pressure 150/93 oxygen saturation is 100%.       Prior to Admission medications   Medication Sig Start Date End Date Taking? Authorizing Provider  albuterol  (PROVENTIL ) (2.5 MG/3ML) 0.083% nebulizer solution 2.5 mg every 6 (six) hours as needed. Patient taking differently: Take 2.5 mg by nebulization every 6 (six) hours as needed for wheezing or shortness of breath. 2.5 mg every 6 (six) hours as needed. 07/15/21   Oley Bascom RAMAN, NP  albuterol  (VENTOLIN  HFA) 108 (90 Base) MCG/ACT inhaler Inhale 2 puffs into the lungs every 6 (six) hours as needed for wheezing or shortness of breath. 09/08/21   Myrna Camelia HERO, NP  albuterol  (VENTOLIN  HFA) 108 (90 Base) MCG/ACT inhaler Inhale 2 puffs into the lungs every 6 (six) hours as needed for wheezing or shortness of breath. 08/07/23   Kennyth Domino, FNP  ARIPiprazole  (ABILIFY ) 5 MG tablet Take 1 tablet (5 mg total) by mouth daily. 02/27/24   Harl Zane BRAVO, NP  aspirin  EC 81 MG tablet Take 1 tablet (81 mg total) by mouth daily. Swallow whole. 01/09/23   Vernon Ranks, MD  atorvastatin  (LIPITOR) 20 MG tablet Take 1 tablet (20 mg total) by mouth daily. 01/09/23 03/11/23  Vernon Ranks, MD  buPROPion  (WELLBUTRIN  XL) 150 MG 24 hr tablet Take 1 tablet (150 mg total) by mouth every morning. 02/27/24   Parsons, Brittney E, NP  gabapentin  (NEURONTIN ) 300 MG capsule Take 1 capsule (300 mg  total) by mouth 3 (three) times daily. 02/27/24   Harl Zane BRAVO, NP  hydrochlorothiazide  (HYDRODIURIL ) 25 MG tablet Take 1 tablet (25 mg total) by mouth daily. 08/13/23   Vivienne Delon HERO, PA-C  methocarbamol  (ROBAXIN ) 500 MG tablet Take 1 tablet (500 mg total) by mouth 2 (two) times daily as needed for muscle spasms. 05/15/23   Hazen Darryle BRAVO, FNP  ondansetron  (ZOFRAN -ODT) 4 MG disintegrating tablet Take 1-2 tablets (4-8 mg total) by mouth every 8 (eight) hours as needed. 10/18/23   Vivienne Delon HERO, PA-C  polyethylene glycol (MIRALAX ) 17 g packet Take 17 g by mouth daily. 05/15/23   Hazen Darryle BRAVO, FNP  traZODone  (DESYREL ) 50 MG tablet Take 1-2 tablets (50-100 mg total) by mouth at bedtime as needed for sleep. 02/27/24   Harl Zane BRAVO, NP    Allergies: Patient has no known allergies.    Review of Systems  Constitutional:  Positive for fever. Negative for chills.  HENT:  Positive for congestion. Negative for ear pain and sore throat.   Eyes:  Negative for pain and visual disturbance.  Respiratory:  Positive for shortness of breath. Negative for cough.   Cardiovascular:  Negative for chest pain and palpitations.  Gastrointestinal:  Negative for abdominal pain, diarrhea and vomiting.  Genitourinary:  Negative for dysuria and hematuria.  Musculoskeletal:  Positive for myalgias. Negative for arthralgias and back pain.  Skin:  Negative for  color change and rash.  Neurological:  Negative for seizures and syncope.  All other systems reviewed and are negative.   Updated Vital Signs BP (!) 150/93 (BP Location: Right Arm)   Pulse 85   Temp 98.4 F (36.9 C) (Oral)   Resp 14   SpO2 100%   Physical Exam  (all labs ordered are listed, but only abnormal results are displayed) Labs Reviewed  RESP PANEL BY RT-PCR (RSV, FLU A&B, COVID)  [id number]  CBC WITH DIFFERENTIAL/PLATELET  BASIC METABOLIC PANEL WITH GFR    EKG: None  Radiology: No results found.   Procedures    Medications Ordered in the ED - No data to display                                  Medical Decision Making Amount and/or Complexity of Data Reviewed Labs: ordered. Radiology: ordered.   Symptoms suggestive of upper respiratory infection.  Respiratory panel negative for COVID flu and RSV.  Will get chest x-ray CBC and basic metabolic panel.  Patient nontoxic no acute distress.  No respiratory distress no hypoxia.  Patient CBC white count 7.6 hemoglobin 7.5 not to change from before platelets 398.  Normal differential.  Basic metabolic panel electrolytes normal renal function normal.  Respiratory panel negative.  Chest x-ray negative for any acute findings.  Review of past hemoglobins August 6 to year ago was 8.6.  April 1 the year ago it was 7.7.  Says today 7.5 so she will need to have that followed closely.  But not at the need for transfusion.  Will treat symptomatically.  Final diagnoses:  Influenza-like illness    ED Discharge Orders     None          Geraldene Hamilton, MD 05/27/24 1814    Geraldene Hamilton, MD 05/27/24 [phone]

## 2024-06-03 ENCOUNTER — Telehealth: Payer: Self-pay

## 2024-06-03 NOTE — Telephone Encounter (Signed)
 Scheduled patient an appointment for tomorrow 06/04/24

## 2024-06-04 ENCOUNTER — Ambulatory Visit (INDEPENDENT_AMBULATORY_CARE_PROVIDER_SITE_OTHER): Payer: Self-pay | Admitting: Nurse Practitioner

## 2024-06-04 VITALS — BP 139/89 | HR 90 | Temp 97.7°F | Wt 280.0 lb

## 2024-06-04 DIAGNOSIS — D649 Anemia, unspecified: Secondary | ICD-10-CM

## 2024-06-04 DIAGNOSIS — N921 Excessive and frequent menstruation with irregular cycle: Secondary | ICD-10-CM

## 2024-06-04 NOTE — Progress Notes (Signed)
 Subjective   Patient ID: Nancy Dickerson, female    DOB: 12-20-1980, 43 y.o.   MRN: [id number]  Chief Complaint  Patient presents with   Medical Management of Chronic Issues    Patient stated that her Hemoglobin is 7.5    Referring provider: Myrna Camelia HERO, NP  Nancy Dickerson is a 43 y.o. female with Past Medical History: No date: Anemia No date: Arthritis No date: Asthma No date: Bursitis of hip No date: Cardiomegaly No date: Chondromalacia of hip No date: Chondromalacia of right knee No date: COPD (chronic obstructive pulmonary disease) (HCC) No date: GERD (gastroesophageal reflux disease) No date: H/O transfusion of packed red blood cells No date: Stroke [facility]) No date: Tinnitus   HPI  Patient presents today for an ED follow-up.  She was seen in the ED on 05/27/2024.  She was found to be anemic with a hemoglobin of 7.5.  She was not treated.  She was sent home.  She is having heavy menstrual bleeding.  She is having irregular periods.  We will place a urgent referral to OB/GYN for further evaluation and treatment.  We will recheck labs today.  Vital signs are stable in office today.  Patient states that she has been feeling very fatigued. Denies f/c/s, n/v/d, hemoptysis, PND, leg swelling Denies chest pain or edema    No Known Allergies  Immunization History  Administered Date(s) Administered   Tdap 12/13/2020    Tobacco History: Social History   Tobacco Use  Smoking Status Never  Smokeless Tobacco Never   Counseling given: Not Answered   Outpatient Encounter Medications as of 06/04/2024  Medication Sig   albuterol  (VENTOLIN  HFA) 108 (90 Base) MCG/ACT inhaler Inhale 2 puffs into the lungs every 6 (six) hours as needed for wheezing or shortness of breath.   ARIPiprazole  (ABILIFY ) 5 MG tablet Take 1 tablet (5 mg total) by mouth daily.   buPROPion  (WELLBUTRIN  XL) 150 MG 24 hr tablet Take 1 tablet (150 mg total) by mouth every morning.   gabapentin  (NEURONTIN )  300 MG capsule Take 1 capsule (300 mg total) by mouth 3 (three) times daily.   hydrochlorothiazide  (HYDRODIURIL ) 25 MG tablet Take 1 tablet (25 mg total) by mouth daily.   traZODone  (DESYREL ) 50 MG tablet Take 1-2 tablets (50-100 mg total) by mouth at bedtime as needed for sleep.   albuterol  (PROVENTIL ) (2.5 MG/3ML) 0.083% nebulizer solution 2.5 mg every 6 (six) hours as needed. (Patient taking differently: Take 2.5 mg by nebulization every 6 (six) hours as needed for wheezing or shortness of breath. 2.5 mg every 6 (six) hours as needed.)   albuterol  (VENTOLIN  HFA) 108 (90 Base) MCG/ACT inhaler Inhale 2 puffs into the lungs every 6 (six) hours as needed for wheezing or shortness of breath.   aspirin  EC 81 MG tablet Take 1 tablet (81 mg total) by mouth daily. Swallow whole.   atorvastatin  (LIPITOR) 20 MG tablet Take 1 tablet (20 mg total) by mouth daily.   methocarbamol  (ROBAXIN ) 500 MG tablet Take 1 tablet (500 mg total) by mouth 2 (two) times daily as needed for muscle spasms.   ondansetron  (ZOFRAN -ODT) 4 MG disintegrating tablet Take 1-2 tablets (4-8 mg total) by mouth every 8 (eight) hours as needed.   polyethylene glycol (MIRALAX ) 17 g packet Take 17 g by mouth daily.   No facility-administered encounter medications on file as of 06/04/2024.    Review of Systems  Review of Systems  Constitutional: Negative.   HENT: Negative.  Cardiovascular: Negative.   Gastrointestinal: Negative.   Allergic/Immunologic: Negative.   Neurological: Negative.   Psychiatric/Behavioral: Negative.       Objective:   BP 139/89   Pulse 90   Temp 97.7 F (36.5 C) (Oral)   Wt 280 lb (127 kg)   SpO2 100%   BMI 48.06 kg/m   Wt Readings from Last 5 Encounters:  06/04/24 280 lb (127 kg)  03/11/23 280 lb (127 kg)  01/08/23 278 lb 3.5 oz (126.2 kg)  01/02/23 283 lb 3.2 oz (128.5 kg)  01/31/22 263 lb 14.3 oz (119.7 kg)     Physical Exam Vitals and nursing note reviewed.  Constitutional:       General: She is not in acute distress.    Appearance: She is well-developed.  Cardiovascular:     Rate and Rhythm: Normal rate and regular rhythm.  Pulmonary:     Effort: Pulmonary effort is normal.     Breath sounds: Normal breath sounds.  Neurological:     Mental Status: She is alert and oriented to person, place, and time.       Assessment & Plan:   Menorrhagia with irregular cycle -     Ambulatory referral to Obstetrics / Gynecology  Anemia, unspecified type -     CBC -     Comprehensive metabolic panel with GFR     Return in about 4 weeks (around 07/02/2024).   Bascom GORMAN Borer, NP 06/04/2024

## 2024-06-05 ENCOUNTER — Telehealth: Payer: Self-pay

## 2024-06-05 ENCOUNTER — Ambulatory Visit: Payer: Self-pay | Admitting: Nurse Practitioner

## 2024-06-05 LAB — COMPREHENSIVE METABOLIC PANEL WITH GFR
ALT: 17 IU/L (ref 0–32)
AST: 15 IU/L (ref 0–40)
Albumin: 4.1 g/dL (ref 3.9–4.9)
Alkaline Phosphatase: 81 IU/L (ref 44–121)
BUN/Creatinine Ratio: 10 (ref 9–23)
BUN: 9 mg/dL (ref 6–24)
Bilirubin Total: <0.2 mg/dL (ref 0.0–1.2)
CO2: 24 mmol/L (ref 20–29)
Calcium: 9.4 mg/dL (ref 8.7–10.2)
Chloride: 102 mmol/L (ref 96–106)
Creatinine, Ser: 0.92 mg/dL (ref 0.57–1.00)
Globulin, Total: 3.3 g/dL (ref 1.5–4.5)
Glucose: 77 mg/dL (ref 70–99)
Potassium: 4.2 mmol/L (ref 3.5–5.2)
Sodium: 138 mmol/L (ref 134–144)
Total Protein: 7.4 g/dL (ref 6.0–8.5)
eGFR: 80 mL/min/1.73 (ref >59)

## 2024-06-05 LAB — CBC
Hematocrit: 29.5 % — ABNORMAL LOW (ref 34.0–46.6)
Hemoglobin: 7.8 g/dL — ABNORMAL LOW (ref 11.1–15.9)
MCH: 19.4 pg — ABNORMAL LOW (ref 26.6–33.0)
MCHC: 26.4 g/dL — ABNORMAL LOW (ref 31.5–35.7)
MCV: 73 fL — ABNORMAL LOW (ref 79–97)
Platelets: 494 x10E3/uL — ABNORMAL HIGH (ref 150–450)
RBC: 4.03 x10E6/uL (ref 3.77–5.28)
RDW: 18.7 % — ABNORMAL HIGH (ref 11.7–15.4)
WBC: 8.6 x10E3/uL (ref 3.4–10.8)

## 2024-06-05 NOTE — Telephone Encounter (Signed)
 Pt was advised of appt with obgyn 06/27/2024 at 8:15 am. Pt was also advised a message was sent to Tonya  and Dr. Jegede for info on wanting a iron infusion. KH

## 2024-06-12 ENCOUNTER — Other Ambulatory Visit

## 2024-06-12 ENCOUNTER — Other Ambulatory Visit: Payer: Self-pay | Admitting: Nurse Practitioner

## 2024-06-12 ENCOUNTER — Telehealth: Payer: Self-pay | Admitting: Nurse Practitioner

## 2024-06-12 DIAGNOSIS — D649 Anemia, unspecified: Secondary | ICD-10-CM

## 2024-06-12 DIAGNOSIS — N921 Excessive and frequent menstruation with irregular cycle: Secondary | ICD-10-CM

## 2024-06-12 NOTE — Telephone Encounter (Signed)
 Patient came to the office requesting iron infusion due to symptomatic anemia. Discussed with Dr. Jegede. She will be placed on the Day hospital schedule tomorrow and Dr. Jegede will order infusion.

## 2024-06-13 ENCOUNTER — Ambulatory Visit: Payer: Self-pay | Admitting: Nurse Practitioner

## 2024-06-13 ENCOUNTER — Non-Acute Institutional Stay (HOSPITAL_COMMUNITY)
Admission: RE | Admit: 2024-06-13 | Discharge: 2024-06-13 | Disposition: A | Discharge status: Home / Self Care | Payer: Self-pay | Source: Ambulatory Visit | Attending: Internal Medicine | Admitting: Internal Medicine

## 2024-06-13 DIAGNOSIS — D500 Iron deficiency anemia secondary to blood loss (chronic): Secondary | ICD-10-CM | POA: Insufficient documentation

## 2024-06-13 LAB — CBC
Hematocrit: 27.9 % — ABNORMAL LOW (ref 34.0–46.6)
Hemoglobin: 7.4 g/dL — ABNORMAL LOW (ref 11.1–15.9)
MCH: 19.1 pg — ABNORMAL LOW (ref 26.6–33.0)
MCHC: 26.5 g/dL — ABNORMAL LOW (ref 31.5–35.7)
MCV: 72 fL — ABNORMAL LOW (ref 79–97)
Platelets: 533 x10E3/uL — ABNORMAL HIGH (ref 150–450)
RBC: 3.88 x10E6/uL (ref 3.77–5.28)
RDW: 18.6 % — ABNORMAL HIGH (ref 11.7–15.4)
WBC: 11.9 x10E3/uL — ABNORMAL HIGH (ref 3.4–10.8)

## 2024-06-13 LAB — COMPREHENSIVE METABOLIC PANEL WITH GFR
ALT: 12 IU/L (ref 0–32)
AST: 15 IU/L (ref 0–40)
Albumin: 4.3 g/dL (ref 3.9–4.9)
Alkaline Phosphatase: 72 IU/L (ref 44–121)
BUN/Creatinine Ratio: 12 (ref 9–23)
BUN: 9 mg/dL (ref 6–24)
Bilirubin Total: <0.2 mg/dL (ref 0.0–1.2)
CO2: 23 mmol/L (ref 20–29)
Calcium: 9.5 mg/dL (ref 8.7–10.2)
Chloride: 100 mmol/L (ref 96–106)
Creatinine, Ser: 0.74 mg/dL (ref 0.57–1.00)
Globulin, Total: 3.5 g/dL (ref 1.5–4.5)
Glucose: 97 mg/dL (ref 70–99)
Potassium: 4.3 mmol/L (ref 3.5–5.2)
Sodium: 137 mmol/L (ref 134–144)
Total Protein: 7.8 g/dL (ref 6.0–8.5)
eGFR: 104 mL/min/1.73 (ref >59)

## 2024-06-13 MED ORDER — SODIUM CHLORIDE 0.9 % IV SOLN
510.0000 mg | Freq: Once | INTRAVENOUS | Status: AC
  Administered 2024-06-13: 510 mg via INTRAVENOUS
  Filled 2024-06-13: qty 17

## 2024-06-13 MED ORDER — SODIUM CHLORIDE 0.9 % IV SOLN: INTRAVENOUS | Status: DC | PRN

## 2024-06-13 NOTE — Progress Notes (Signed)
 Diagnosis: Iron deficiency anemia    Provider: Jegede, olu      Procedure: Fereheme 510 mg 1 of 1    Note:  pt seen today for iron infusion, she was given no pre meds, and declined to stay for post observation period.  Pt tolerated infusion well, her vital signs were stable throughout infusion and after.  Pt advised to watch for any abnormalities or feelings of concerns,  pt states she is good ,and Works  next door to our office.  Pt will follow up with her doctor and if there is any other appts needed she will contact us .

## 2024-06-19 ENCOUNTER — Telehealth: Payer: Self-pay

## 2024-06-19 NOTE — Telephone Encounter (Signed)
 Pt was called and made an appointment to follow up with Tonya post iron infusion. Pt advised she always has had to have a iron infusion and it has nothing to do with her cycle being heavy. Pt was advise to discuss a referral to Hematology with provider at this appointment. KH

## 2024-06-23 ENCOUNTER — Ambulatory Visit: Payer: Self-pay | Admitting: Nurse Practitioner

## 2024-06-27 ENCOUNTER — Encounter: Payer: Self-pay | Admitting: Obstetrics and Gynecology

## 2024-06-27 ENCOUNTER — Telehealth: Payer: Self-pay

## 2024-06-27 NOTE — Telephone Encounter (Signed)
 Appointment scheduled.

## 2024-06-30 ENCOUNTER — Encounter: Payer: Self-pay | Admitting: Hematology

## 2024-06-30 ENCOUNTER — Other Ambulatory Visit: Payer: Self-pay

## 2024-06-30 ENCOUNTER — Ambulatory Visit (INDEPENDENT_AMBULATORY_CARE_PROVIDER_SITE_OTHER): Payer: Self-pay | Admitting: Nurse Practitioner

## 2024-06-30 ENCOUNTER — Encounter: Payer: Self-pay | Admitting: Nurse Practitioner

## 2024-06-30 VITALS — BP 138/80 | HR 73 | Resp 18 | Ht 64.0 in | Wt 280.0 lb

## 2024-06-30 DIAGNOSIS — G4733 Obstructive sleep apnea (adult) (pediatric): Secondary | ICD-10-CM

## 2024-06-30 DIAGNOSIS — E66813 Obesity, class 3: Secondary | ICD-10-CM

## 2024-06-30 DIAGNOSIS — Z3009 Encounter for other general counseling and advice on contraception: Secondary | ICD-10-CM

## 2024-06-30 DIAGNOSIS — R109 Unspecified abdominal pain: Secondary | ICD-10-CM

## 2024-06-30 DIAGNOSIS — Z6841 Body Mass Index (BMI) 40.0 and over, adult: Secondary | ICD-10-CM

## 2024-06-30 DIAGNOSIS — N921 Excessive and frequent menstruation with irregular cycle: Secondary | ICD-10-CM

## 2024-06-30 LAB — POCT URINALYSIS DIP (CLINITEK)
Bilirubin, UA: NEGATIVE
Glucose, UA: NEGATIVE mg/dL
Ketones, POC UA: NEGATIVE mg/dL
Leukocytes, UA: NEGATIVE
Nitrite, UA: NEGATIVE
Spec Grav, UA: 1.025 (ref 1.010–1.025)
Urobilinogen, UA: 0.2 U/dL
pH, UA: 6 (ref 5.0–8.0)

## 2024-06-30 LAB — POCT URINE PREGNANCY: Preg Test, Ur: NEGATIVE

## 2024-06-30 MED ORDER — WEGOVY 0.25 MG/0.5ML ~~LOC~~ SOAJ
0.2500 mg | SUBCUTANEOUS | 2 refills | Status: AC
  Filled 2024-06-30: qty 2, 28d supply, fill #0

## 2024-06-30 MED ORDER — NORGESTIMATE-ETH ESTRADIOL 0.25-35 MG-MCG PO TABS
1.0000 | ORAL_TABLET | Freq: Every day | ORAL | 11 refills | Status: AC
  Filled 2024-06-30: qty 28, 28d supply, fill #0

## 2024-06-30 NOTE — Progress Notes (Signed)
 Subjective   Patient ID: Nancy Dickerson, female    DOB: 08-Jan-1981, 43 y.o.   MRN: [id number]  Chief Complaint  Patient presents with   Contraception    Referring provider: Myrna Camelia HERO, NP  Nancy Dickerson is a 43 y.o. female with Past Medical History: No date: Anemia No date: Arthritis No date: Asthma No date: Bursitis of hip No date: Cardiomegaly No date: Chondromalacia of hip No date: Chondromalacia of right knee No date: COPD (chronic obstructive pulmonary disease) (HCC) No date: GERD (gastroesophageal reflux disease) No date: H/O transfusion of packed red blood cells No date: Stroke [facility]) No date: Tinnitus   HPI  Patient presents today for a acute visit.  She would like to be started back on birth control.  Pregnancy test in office today was negative we will order Sprintec .  Patient does need to see OB/GYN but states that the co-pays are $100.  Patient is inquiring about starting on Wegovy  for weight loss and sleep apnea.  We will try to order this for her.  Patient has been having left-sided flank pain and states that she does have a history of kidney stones.  She did have protein in trace blood in her urine today.  We will place a referral to urology for further evaluation and treatment. Denies f/c/s, n/v/d, hemoptysis, PND, leg swelling Denies chest pain or edema     No Known Allergies  Immunization History  Administered Date(s) Administered   Tdap 12/13/2020    Tobacco History: Social History   Tobacco Use  Smoking Status Never  Smokeless Tobacco Never   Counseling given: Not Answered   Outpatient Encounter Medications as of 06/30/2024  Medication Sig   albuterol  (VENTOLIN  HFA) 108 (90 Base) MCG/ACT inhaler Inhale 2 puffs into the lungs every 6 (six) hours as needed for wheezing or shortness of breath.   ARIPiprazole  (ABILIFY ) 5 MG tablet Take 1 tablet (5 mg total) by mouth daily.   buPROPion  (WELLBUTRIN  XL) 150 MG 24 hr tablet Take 1 tablet (150  mg total) by mouth every morning.   gabapentin  (NEURONTIN ) 300 MG capsule Take 1 capsule (300 mg total) by mouth 3 (three) times daily.   hydrochlorothiazide  (HYDRODIURIL ) 25 MG tablet Take 1 tablet (25 mg total) by mouth daily.   norgestimate -ethinyl estradiol  (ORTHO-CYCLEN) 0.25-35 MG-MCG tablet Take 1 tablet by mouth daily.   semaglutide -weight management (WEGOVY ) 0.25 MG/0.5ML SOAJ SQ injection Inject 0.25 mg into the skin once a week.   traZODone  (DESYREL ) 50 MG tablet Take 1-2 tablets (50-100 mg total) by mouth at bedtime as needed for sleep.   albuterol  (PROVENTIL ) (2.5 MG/3ML) 0.083% nebulizer solution 2.5 mg every 6 (six) hours as needed. (Patient taking differently: Take 2.5 mg by nebulization every 6 (six) hours as needed for wheezing or shortness of breath. 2.5 mg every 6 (six) hours as needed.)   albuterol  (VENTOLIN  HFA) 108 (90 Base) MCG/ACT inhaler Inhale 2 puffs into the lungs every 6 (six) hours as needed for wheezing or shortness of breath.   aspirin  EC 81 MG tablet Take 1 tablet (81 mg total) by mouth daily. Swallow whole.   atorvastatin  (LIPITOR) 20 MG tablet Take 1 tablet (20 mg total) by mouth daily.   methocarbamol  (ROBAXIN ) 500 MG tablet Take 1 tablet (500 mg total) by mouth 2 (two) times daily as needed for muscle spasms.   ondansetron  (ZOFRAN -ODT) 4 MG disintegrating tablet Take 1-2 tablets (4-8 mg total) by mouth every 8 (eight) hours as needed.  polyethylene glycol (MIRALAX ) 17 g packet Take 17 g by mouth daily.   No facility-administered encounter medications on file as of 06/30/2024.    Review of Systems  Review of Systems  Constitutional: Negative.   HENT: Negative.    Cardiovascular: Negative.   Gastrointestinal: Negative.   Allergic/Immunologic: Negative.   Neurological: Negative.   Psychiatric/Behavioral: Negative.       Objective:   BP 138/80   Pulse 73   Resp 18   Ht 5' 4 (1.626 m)   Wt 280 lb (127 kg)   SpO2 99%   BMI 48.06 kg/m   Wt  Readings from Last 5 Encounters:  06/30/24 280 lb (127 kg)  06/04/24 280 lb (127 kg)  03/11/23 280 lb (127 kg)  01/08/23 278 lb 3.5 oz (126.2 kg)  01/02/23 283 lb 3.2 oz (128.5 kg)     Physical Exam Vitals and nursing note reviewed.  Constitutional:      General: She is not in acute distress.    Appearance: She is well-developed.  Cardiovascular:     Rate and Rhythm: Normal rate and regular rhythm.  Pulmonary:     Effort: Pulmonary effort is normal.     Breath sounds: Normal breath sounds.  Neurological:     Mental Status: She is alert and oriented to person, place, and time.       Assessment & Plan:   Menorrhagia with irregular cycle -     POCT urine pregnancy  Flank pain -     POCT URINALYSIS DIP (CLINITEK)  Birth control counseling -     Ambulatory referral to Urology  Class 3 severe obesity due to excess calories without serious comorbidity with body mass index (BMI) of 45.0 to 49.9 in adult -     Wegovy ; Inject 0.25 mg into the skin once a week.  Dispense: 2 mL; Refill: 2  OSA (obstructive sleep apnea) -     Wegovy ; Inject 0.25 mg into the skin once a week.  Dispense: 2 mL; Refill: 2  Other orders -     Norgestimate -Eth Estradiol ; Take 1 tablet by mouth daily.  Dispense: 28 tablet; Refill: 11     Return in about 6 months (around 12/28/2024).     Bascom GORMAN Borer, NP 06/30/2024

## 2024-07-01 ENCOUNTER — Other Ambulatory Visit: Payer: Self-pay

## 2024-07-01 ENCOUNTER — Encounter: Payer: Self-pay | Admitting: Hematology

## 2024-07-03 ENCOUNTER — Ambulatory Visit: Payer: Self-pay | Admitting: Orthopaedic Surgery

## 2024-07-10 ENCOUNTER — Other Ambulatory Visit: Payer: Self-pay

## 2024-08-11 ENCOUNTER — Encounter: Payer: Self-pay | Admitting: Radiology

## 2024-12-05 ENCOUNTER — Ambulatory Visit: Payer: Self-pay | Admitting: Nurse Practitioner

## 2024-12-29 ENCOUNTER — Ambulatory Visit: Payer: Self-pay | Admitting: Nurse Practitioner
